# Patient Record
Sex: Male | Born: 1960
Health system: Southern US, Community
[De-identification: ages and names within clinical notes are randomized; demographics above are authoritative.]

## PROBLEM LIST (undated history)

## (undated) DIAGNOSIS — I4891 Unspecified atrial fibrillation: Secondary | ICD-10-CM

## (undated) DIAGNOSIS — Z5189 Encounter for other specified aftercare: Secondary | ICD-10-CM

## (undated) DIAGNOSIS — E785 Hyperlipidemia, unspecified: Secondary | ICD-10-CM

## (undated) DIAGNOSIS — J45909 Unspecified asthma, uncomplicated: Secondary | ICD-10-CM

## (undated) DIAGNOSIS — E119 Type 2 diabetes mellitus without complications: Secondary | ICD-10-CM

## (undated) DIAGNOSIS — K219 Gastro-esophageal reflux disease without esophagitis: Secondary | ICD-10-CM

## (undated) DIAGNOSIS — D649 Anemia, unspecified: Secondary | ICD-10-CM

## (undated) DIAGNOSIS — I10 Essential (primary) hypertension: Secondary | ICD-10-CM

## (undated) HISTORY — DX: Anemia, unspecified: D64.9

## (undated) HISTORY — DX: Type 2 diabetes mellitus without complications: E11.9

## (undated) HISTORY — DX: Encounter for other specified aftercare: Z51.89

## (undated) HISTORY — PX: HERNIA REPAIR: SHX51

## (undated) HISTORY — DX: Hyperlipidemia, unspecified: E78.5

## (undated) HISTORY — DX: Unspecified asthma, uncomplicated: J45.909

---

## 1998-05-03 ENCOUNTER — Ambulatory Visit (HOSPITAL_COMMUNITY): Admission: RE | Admit: 1998-05-03 | Discharge: 1998-05-03 | Payer: Self-pay | Admitting: General Surgery

## 1998-05-05 ENCOUNTER — Emergency Department (HOSPITAL_COMMUNITY): Admission: EM | Admit: 1998-05-05 | Discharge: 1998-05-05 | Payer: Self-pay | Admitting: Internal Medicine

## 1998-06-14 ENCOUNTER — Encounter: Payer: Self-pay | Admitting: General Surgery

## 1998-06-14 ENCOUNTER — Ambulatory Visit (HOSPITAL_COMMUNITY): Admission: RE | Admit: 1998-06-14 | Discharge: 1998-06-14 | Payer: Self-pay | Admitting: General Surgery

## 2001-07-22 ENCOUNTER — Emergency Department (HOSPITAL_COMMUNITY): Admission: EM | Admit: 2001-07-22 | Discharge: 2001-07-22 | Payer: Self-pay | Admitting: Emergency Medicine

## 2001-10-08 ENCOUNTER — Encounter: Payer: Self-pay | Admitting: Emergency Medicine

## 2001-10-08 ENCOUNTER — Emergency Department (HOSPITAL_COMMUNITY): Admission: EM | Admit: 2001-10-08 | Discharge: 2001-10-08 | Payer: Self-pay | Admitting: Emergency Medicine

## 2002-06-03 ENCOUNTER — Emergency Department (HOSPITAL_COMMUNITY): Admission: EM | Admit: 2002-06-03 | Discharge: 2002-06-03 | Payer: Self-pay | Admitting: Emergency Medicine

## 2002-08-14 ENCOUNTER — Emergency Department (HOSPITAL_COMMUNITY): Admission: EM | Admit: 2002-08-14 | Discharge: 2002-08-14 | Payer: Self-pay | Admitting: Emergency Medicine

## 2002-10-19 ENCOUNTER — Emergency Department (HOSPITAL_COMMUNITY): Admission: EM | Admit: 2002-10-19 | Discharge: 2002-10-19 | Payer: Self-pay | Admitting: Emergency Medicine

## 2002-10-19 ENCOUNTER — Encounter: Payer: Self-pay | Admitting: Emergency Medicine

## 2002-11-26 ENCOUNTER — Emergency Department (HOSPITAL_COMMUNITY): Admission: EM | Admit: 2002-11-26 | Discharge: 2002-11-26 | Payer: Self-pay | Admitting: Emergency Medicine

## 2003-01-21 ENCOUNTER — Emergency Department (HOSPITAL_COMMUNITY): Admission: EM | Admit: 2003-01-21 | Discharge: 2003-01-21 | Payer: Self-pay | Admitting: Emergency Medicine

## 2006-03-26 ENCOUNTER — Inpatient Hospital Stay (HOSPITAL_COMMUNITY): Admission: EM | Admit: 2006-03-26 | Discharge: 2006-03-29 | Payer: Self-pay | Admitting: Emergency Medicine

## 2006-03-26 ENCOUNTER — Ambulatory Visit: Payer: Self-pay | Admitting: Cardiology

## 2006-03-27 ENCOUNTER — Encounter: Payer: Self-pay | Admitting: Cardiovascular Disease

## 2006-03-28 ENCOUNTER — Encounter: Payer: Self-pay | Admitting: Cardiology

## 2007-10-16 ENCOUNTER — Emergency Department (HOSPITAL_COMMUNITY): Admission: EM | Admit: 2007-10-16 | Discharge: 2007-10-16 | Payer: Self-pay | Admitting: Emergency Medicine

## 2008-09-05 IMAGING — CR DG CHEST 2V
2 series · 2 of 2 positions shown · non-contrast
Comparison: 03/26/06.

CLINICAL DATA: High blood pressure, short of breath.  
 CHEST - 2 VIEW:

[w chest pa]
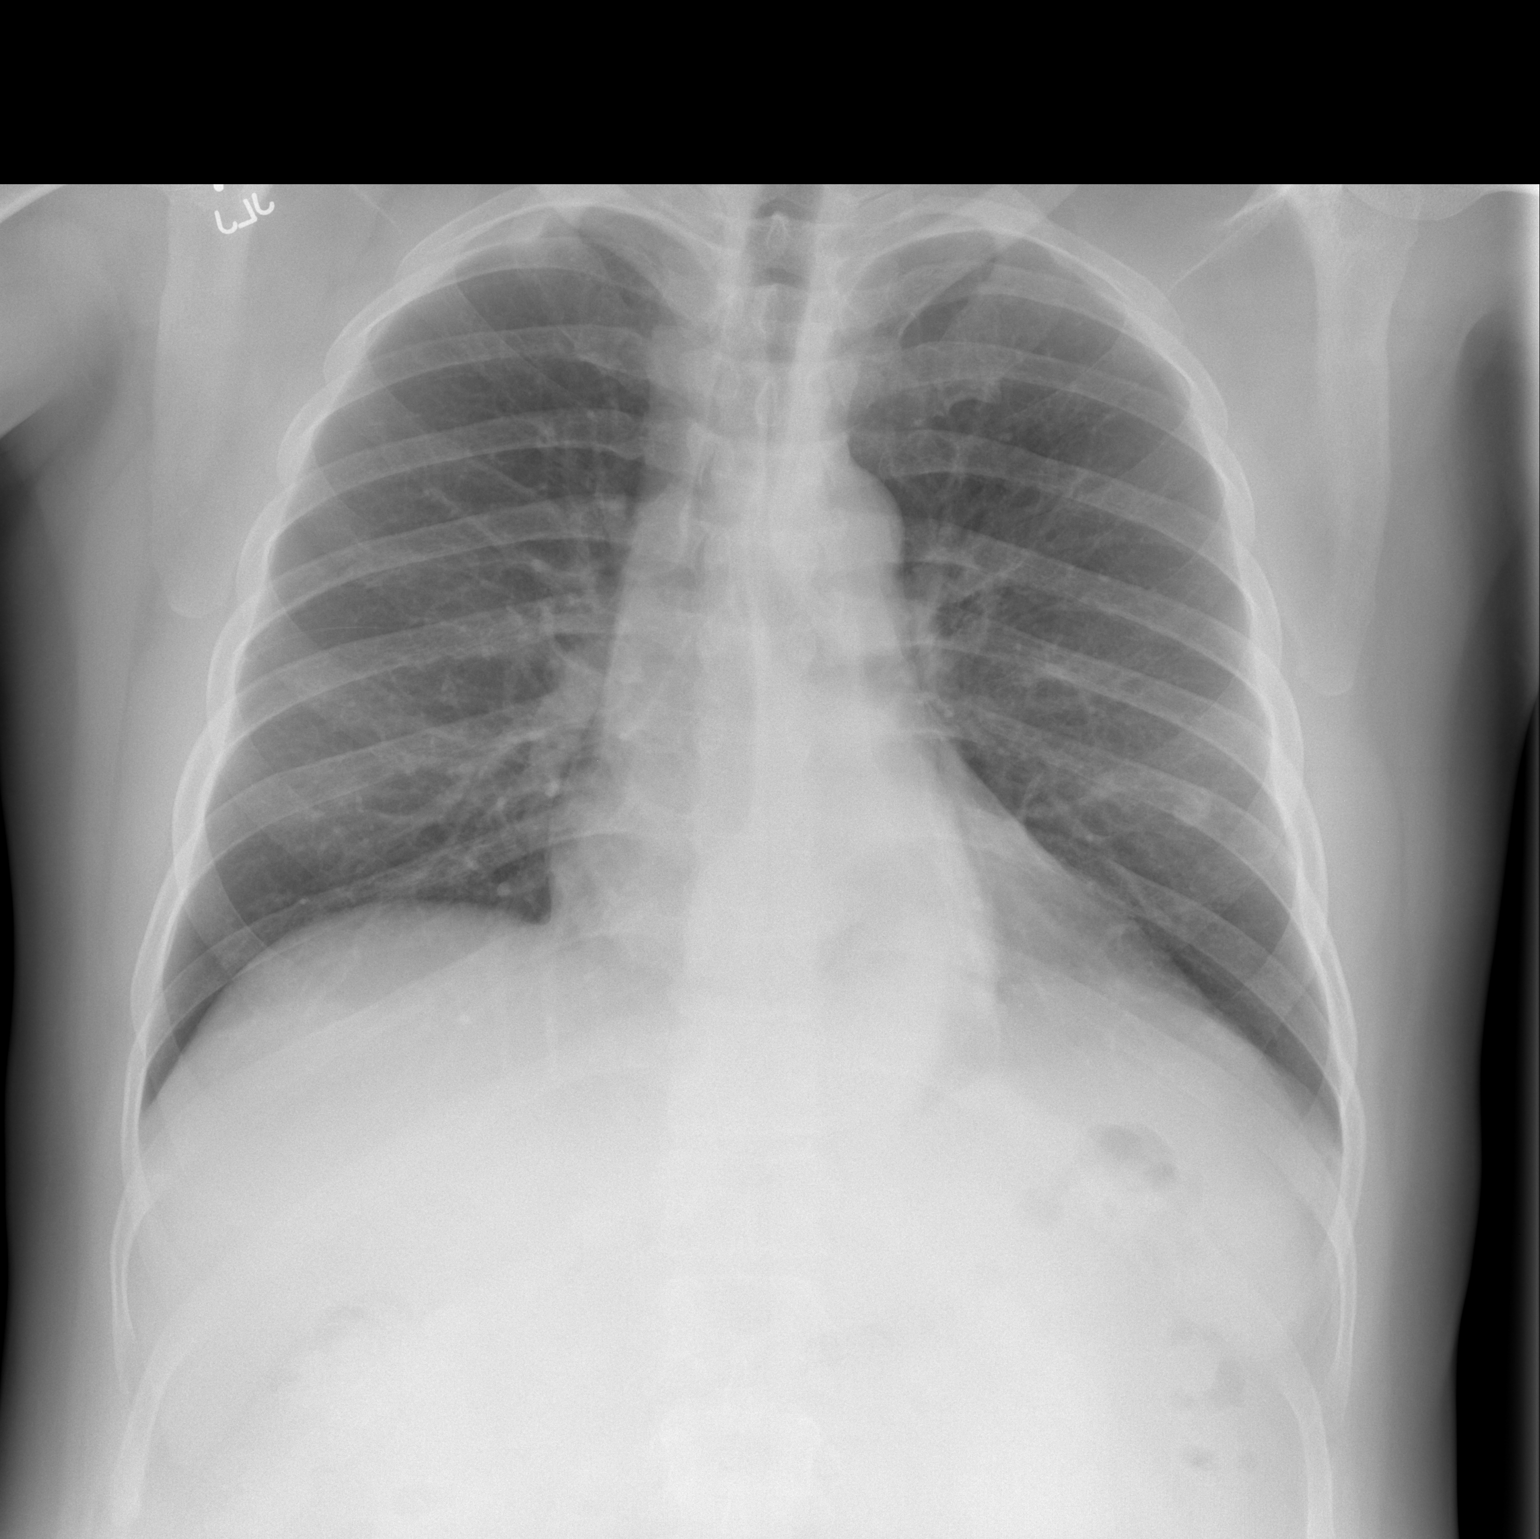

[w chest lat]
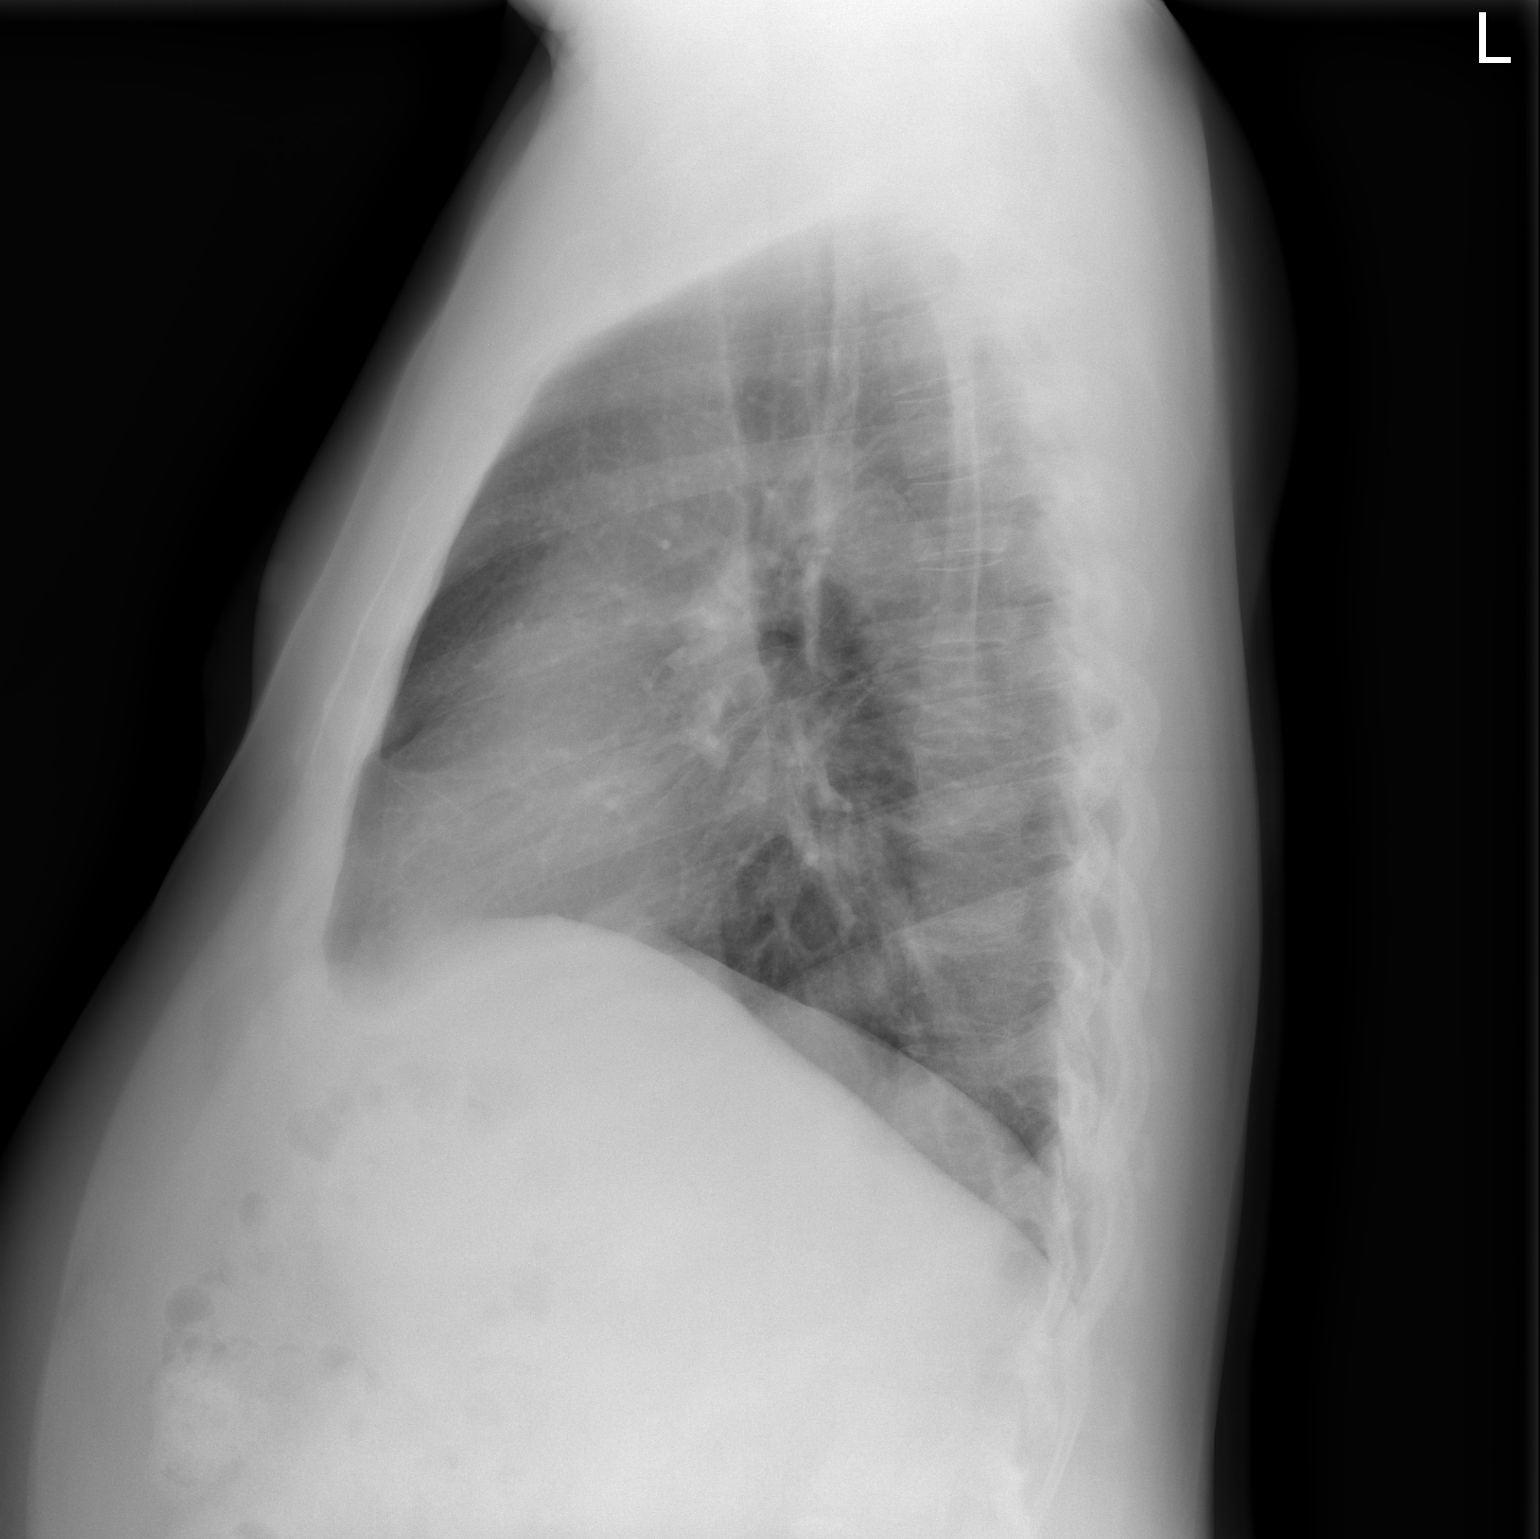

[2 of 2 positions shown; findings below may reference images not displayed]

FINDINGS: Heart and mediastinal contours normal.  Lungs clear except for a rounded nodular shadow projecting over the left lower lobe.  This may be the left nipple shadow, but a repeat PA with nipple markers is needed.
 There is a hiatal hernia.
 No heart failure or pleural fluid.
IMPRESSION: 1.  Probable left nipple shadow simulating lung nodule -- need repeat PA with nipples marked. 
 2.  Hiatal hernia.

## 2011-02-02 NOTE — Consult Note (Signed)
NAME:  Jonathan Gonzalez, Jonathan Gonzalez NO.:  [id number]   MEDICAL RECORD NO.:  [id number]          PATIENT TYPE:  INP   LOCATION:  1417                         FACILITY:  [facility]   PHYSICIAN:  Willa Rough, M.D.     DATE OF BIRTH:  [birth year]   DATE OF CONSULTATION:  03/27/2006  DATE OF DISCHARGE:                                   CONSULTATION   PRIMARY CARE PHYSICIAN:  None.   PRIMARY CARDIOLOGIST:  New, will be Willa Rough, M.D.   CONSULTING PHYSICIAN:  Willa Rough, M.D.   CHIEF COMPLAINT:  Chest pain.   HISTORY OF PRESENT ILLNESS:  Jonathan Gonzalez is a 50 year old male with no  previous history of coronary artery disease.  He was wakened by substernal  chest pain described as a tightness and pressure at 5/10 at 2 a.m. on March 26, 2006.  The pain radiated to his left shoulder and arm and was associated  with shortness of breath and diaphoresis but no nausea or vomiting.  It  lasted approximately 15 minutes and then he slept.  Later that day, he had a  second episode that again last about 15 minutes.  It also started at rest.  He was not under any unusual amount of stress.  He came to the emergency  room.  [facility] team admitted him for chest pain and hypertension and we  were asked to consult.   These are the only 2 episodes of chest pain [name] has ever had.  He  did not try any medications for his pain.  He states that his symptoms were  somewhat different from is regular reflux symptoms which he has frequently  and which do occasionally wake him.  He has been walking regularly for  exercise without any shortness of breath or chest tightness.   PAST MEDICAL HISTORY:  1.  He has a history of hypertension which he states was first noticed at      age 33.  2.  He denies any history of diabetes, hyperlipidemia, family history of      premature coronary artery disease, obesity, tobacco or drug use.  3.  He has a history of gastroesophageal reflux disease and hiatal  hernia.  4.  He has a history of noncompliance with hypertensive medications which he      says is because the last physician he saw did not prescribe any, and he      got the impression that he no longer needed them.   SURGICAL HISTORY:  He is status post hernia repair.   ALLERGIES:  No known drug allergies.   MEDICATIONS PRIOR TO ADMISSION:  Was taking no prescription drugs only over-  the-counter Pepcid.   CURRENT MEDICATIONS:  1.  Aspirin 81 mg a day.  2.  DVT Lovenox.  3.  HCTZ 25 mg a day.  4.  Lopressor 25 mg b.i.d.  5.  Protonix 40 mg a day.   SOCIAL HISTORY:  He lives in [place] with his wife and works for the  [employer].  He walks daily for exercise.  He does not and  has never used alcohol, tobacco, or drugs.   FAMILY HISTORY:  His mother died in her 60s of an accidental overdose.  His  father died at age 32, but he does not know the cause.  He has two brothers,  one of whom died of Crohn disease, neither of whom has heart disease.   REVIEW OF SYSTEMS:  He denies any recent fevers, chills, or sweats.  The  chest pain is described above.  He only gets short of breath with the chest  pain.  He denies dyspnea on exertion, orthopnea, PND, edema, or  palpitations.  Occasionally, he coughs and has a slight wheeze when his  reflux symptoms are significant.  He denies depression, anxiety,  arthralgias, hematemesis, hemoptysis, or melena.  He has regular reflux  symptoms which occasionally wake him.  Review of systems is otherwise  negative.   PHYSICAL EXAMINATION:  VITAL SIGNS:  His temperature is 98.5, blood pressure  on admission was 194/125 and after receiving morning medications an hour and  half ago is 150/110.  Pulse is 67 and regular, respiratory rate 20, O2  saturation 99% on 2 liters.  GENERAL:  He is a well-developed, well-nourished, African-American male in  no acute distress.  HEENT:  His head is normocephalic and atraumatic with pupils  equal, round,  and reactive to light and accommodation.  Extraocular movements intact.  Sclera clear.  Nares without discharge.  NECK:  There is no lymphadenopathy, thyromegaly, bruit, or JVD noted.  CV:  His heart is regular in rate rhythm with an S1-S2 but no significant  murmur, rub, or gallop is noted.  His PMI is not displaced.  His distal  pulses are 2+ in all four extremities.  LUNGS:  Clear to auscultation bilaterally.  SKIN:  No rashes or lesions are noted.  ABDOMEN;  Soft and nontender with active bowel sounds.  No  hepatosplenomegaly on palpation.  EXTREMITIES:  There is no cyanosis, clubbing or edema noted.  MUSCULOSKELETAL:  He has got no joint deformity, effusions, and no spine or  CVA tenderness.  NEUROLOGIC:  He is alert and oriented.  His cranial nerves II-XII grossly  intact.   CHEST X-RAY:  No acute disease.   EKG:  Rate is 100 and is sinus rhythm/sinus tachycardia with no acute  ischemic changes, borderline Q-waves are seen in leads I and aVL only, and  borderline voltage criteria for LVH.   LABORATORY VALUES:  Hemoglobin 16.1, hematocrit 49.6, WBC 9.5, platelets  281.  Sodium 140, potassium 3.4, chloride 102, CO2 29, BUN 13, creatinine  1.2, glucose 100.  INR 1.0, PTT 25.  CK-MB and troponin I negative x3.  Total cholesterol 153, triglycerides 76, HDL 54, LDL 84.   IMPRESSION:  1.  Chest pain.  His cardiac enzymes are negative for an myocardial      infarction and his EKG is not acute.  An echocardiogram is pending.  If      his ejection fraction is decreased or he has wall motion abnormalities,      a cardiac catheterization is indicated.  As he had 2 episodes of resting      pain and his hypertension has been poorly controlled, his cardiac risk      factor profile is elevated.  A tentative plan is for cardiac      catheterization on March 28, 2006.  If the cardiac catheterization does     not demonstrate significant coronary artery disease, consider  reflux  as      a source for his symptoms.  2.  Hypertension.  Agree with medications prescribed by primary care.  I      emphasized to the patient the need to be compliant with his medications      and followup as an outpatient.  Per Dr. Henrietta Hoover recommendations and      because his diastolic is greater than 110, we will add enalapril 5 mg to      his medication regimen.  (It is important for compliance that his      medications be available at [facility] for $4).  3.  Mr. Wenrich is otherwise under the excellent care of the [facility]      physicians.  We will supplement his potassium and recheck in a.m..  The      case manager is aware of Mr. Ponzo' need for an outpatient primary      care and they will discuss options with him.   Again, this is Theodore Demark [facility] dictating for Dr. Willa Rough who  determined the plan of care,      Theodore Demark, P.A. [facility]    ______________________________  Willa Rough, M.D.    RB/MEDQ  D:  03/27/2006  T:  03/27/2006  Job:  [id number]

## 2011-02-02 NOTE — Discharge Summary (Signed)
NAME:  Jonathan Gonzalez, Jonathan Gonzalez NO.:  [id number]   MEDICAL RECORD NO.:  [id number]          PATIENT TYPE:  OUT   LOCATION:  MRI                          FACILITY:  [facility]   PHYSICIAN:  Isidor Holts, M.D.  DATE OF BIRTH:  1961-01-06   DATE OF ADMISSION:  03/28/2006  DATE OF DISCHARGE:  03/29/2006                                 DISCHARGE SUMMARY   PMD:  Unassigned.   DISCHARGE DIAGNOSES:  1.  Atypical chest pain, noncardiac.  2.  Gastroesophageal reflux disease (GERD).  3.  Hypertension.  4.  Transient renal insufficiency secondary to HCTZ.   DISCHARGE MEDICATIONS:  1.  Prilosec OTC 20 mg p.o. b.i.d. times one week, then 20 mg p.o. daily.  2.  Lopressor 25 mg p.o. b.i.d.  3.  Vasotec 5 mg p.o. daily.  4.  Aspirin 81 mg p.o. daily.   PROCEDURES:  1.  Previous chest x-ray dated 03/26/2006 showed no active cardiopulmonary      disease.  2.  2-D echocardiogram dated 03/27/2006 showed overall normal left      ventricular systolic function, left ventricle mildly increased, aortic      valve was mildly calcified.  There was mild aortic root dilatation.      There was mild mitral valvular regurgitation.  Left atrium was mildly      dilated.  3.  Adenosine stress Myoview dated 03/28/2006 showed a negative test with no      evidence of reversible defects.  Ejection fraction was 68%.   CONSULTATIONS:  Willa Rough, M.D., cardiologist.   ADMISSION HISTORY:  As per H&P note of 03/26/2006. However, in brief, this  is a 50 year old male, with known history of hypertension, albeit off  antihypertensive medications for approximately three years, who presents  with recurrent central chest pain, radiating to the left side of the neck  and left shoulder, associated with shortness of breath and diaphoresis.  On  initial evaluation in the emergency department, his blood pressure was found  to be markedly elevated at 194/125 mmHg.  The patient was therefore admitted  for evaluation,  investigation and management.   CLINICAL COURSE:  1.  Chest pain.  The patient had recurrent episodes of central chest pain,      against a background of known hypertension, which was now uncontrolled.      This raised the possibility of myocardial ischemia.  A 12-lead EKG      showed no acute ischemic changes.  Chest x-ray was unremarkable.  The      patient was placed on Aspirin, p.r.n. sublingual NTG and beta blocker.      Cardiology consultation was called, and was kindly provided by Dr.      Willa Rough.  The patient underwent 2-D echocardiogram, which showed      overall normal left ventricular function, and mild left ventricular      hypertrophy.  The patient underwent stress Persantine Myoview on      03/28/2006, which showed no reversible defect.  Ejection fraction was      noted to be 58%.  Chest pain is therefore  deemed atypical and not      secondary to ischemic heart disease.   1.  Severe uncontrolled hypertension.  The patient is a known hypertensive,      who has however, not been on anti-hypertensive medications.  As a matter      of fact, he was last on HCTZ approximately three years ago, and has no      regular follow up with any MD.  Blood pressure was markedly elevated at      the time of presentation, i.e. 194/125 mmHg.  The patient was initially      commenced on a combination of beta blocker and HCTZ with satisfactory      blood pressure control.  However, on 03/28/2006, his BUN and creatinine      were noted to be 25/1.6 respectively, against a BUN of 8 and a      creatinine of 1.2 at the time of admission.  This was deemed secondary      to HCTZ, which was then discontinued.  The patient has been placed on      enalapril 5 mg p.o. daily instead and we are happy to note that on      03/29/2005, BUN was 24 with a creatinine of 1.3.  Blood pressure was      also well controlled at 109/75 mmHg on 03/29/2006.   1.  GERD.  This is likely contributory to #1 above.   The patient has been      placed on proton pump inhibitor treatment for this.   1.  Renal insufficiency.  Refer to #2 above.  The patient will need periodic      check of BUN and creatinine, which, we will defer to patient's PMD.   DISPOSITION:  The patient was considered sufficiently recovered and  clinically stable to be discharged on 03/29/2006.  He is expected to return  to his regular duties on 04/01/2006.   DIET:  Healthy heart/2 gram sodium diet.   ACTIVITY:  No restrictions.   WOUND CARE:  Not applicable.   PAIN MANAGEMENT:  Not applicable.   FOLLOW UP INSTRUCTIONS:  The patient has been strongly recommended to  establish care at [facility], within one week of discharge.  He has been  handed the appropriate information and he is to call for an appointment.  All of this has been communicated to him.  He verbalized understanding.      Isidor Holts, M.D.  Electronically Signed     CO/MEDQ  D:  03/29/2006  T:  03/29/2006  Job:  [id number]

## 2011-02-02 NOTE — H&P (Signed)
NAME:  Jonathan Gonzalez, Jonathan Gonzalez NO.:  [id number]   MEDICAL RECORD NO.:  [id number]          PATIENT TYPE:  EMS   LOCATION:  ED                           [facility]ACILITY[facility]   PHYSICIAN:  Isidor Holts, M.D.  DATE OF BIRTH:  26-Aug-1961   DATE OF ADMISSION:  03/26/2006  DATE OF DISCHARGE:                                HISTORY & PHYSICAL   PRIMARY MEDICAL DOCTOR:  Unassigned.   CHIEF COMPLAINT:  Chest pain.   HISTORY OF PRESENT ILLNESS:  This is a 50 year old male.  For past medical  history, see below.  The patient is, however, a known hypertensive who has  been noncompliant with his medications for approximately 3 years now.  According to the patient, he was quite well until he went to bed on March 25, 2006.  He subsequently was awoken at 2 a.m. on March 26, 2006, by chest pain  which he described as tightness associated with shortness of breath and  diaphoresis and appeared to radiate to the left side of his neck and his  left shoulder.  This lasted approximately 2 minutes and then subsided and  the patient went back to sleep.  At about 12 noon on March 26, 2006, he had  the recurrence of similar chest pain, while sitting and watching TV with his  spouse.  He took 2 tablets of low dose Aspirin and went off to the emergency  department.  The pain lasted approximately 15 minutes and subsided just  after he got into the emergency department.  He has had no recurrences  since.   PAST MEDICAL HISTORY:  Hypertension.   No significant surgical history.   MEDICATION HISTORY:  He was on hydrochlorothiazide, but stopped taking this  about 3 years ago.   ALLERGIES:  No known drug allergies.   SYSTEMS REVIEW:  As per HPI and chief complaint, otherwise negative.   SOCIAL HISTORY:  The patient is married, has 1 son, is a nonsmoker,  nondrinker, has no history of drug abuse, works at [employer].   FAMILY HISTORY:  Had 2 brothers, one died from severe Crohn  disease.  The  other one is alive and well.  The patient's mother died at age 75 years from  diabetes mellitus and end-stage renal disease.  His father died at age 80  years, cause unknown.  Family history is otherwise noncontributory.   PHYSICAL EXAMINATION:  VITAL SIGNS:  Temperature 98.9, pulse 87 per minute  regular, respiratory rate 18, BP 194/125-mmHg - rechecked 164/127-mmHg.  Pulse oximeter 98% on room air.  GENERAL:  The patient does not appear to be in obvious acute distress.  Alert, communicative, not short of breath at rest.  HEENT:  No clinical pallor.  No jaundice.  No conjunctival injection.  NECK:  Supple.  JVP not seen.  No palpable lymphadenopathy.  No palpable  goiter.  CHEST:  Clinically clear to auscultation.  No wheezes.  No crackles.  HEART:  Sounds 1 and 2 heard.  Normal regular.  No murmurs.  ABDOMEN:  Full, soft, and nontender.  There is  no palpable organomegaly.  No  palpable masses.  Normal bowel sounds.  EXTREMITIES:  No pitting edema.  Palpable peripheral pulses.  MUSCULOSKELETAL:  Is quite unremarkable.  CENTRAL NERVOUS SYSTEM:  No focal neurologic deficits on gross examination.   INVESTIGATIONS:  CBC:  WBC 9.5, hemoglobin 16.1, hematocrit 49.6, platelets  281.  Electrolytes:  Sodium 140, potassium 3.5, chloride 100, CO2 30, BUN 8,  creatinine 1.2, glucose 116.  Troponin I at point-of-care less than 0.05.  INR 1.  Chest x-ray, dated March 26, 2006, shows no active disease.  EKG,  dated March 26, 2006, showed sinus rhythm regular, normal axis, 100 per  minute, no acute ischemic changes.   ASSESSMENT/PLAN:  1.  Chest pain.  This is likely angina, secondary to severe uncontrolled      hypertension.  There are no acute EKG changes.  We shall admit for      telemetric monitoring, cycle cardiac enzymes, and do TSH and lipids.      Meanwhile, we will utilize sublingual nitroglycerin as needed and      commence the patient on Aspirin and beta-blocker.   1.   Hypertension.  This is uncontrolled, and likely contributory to #1      above, secondary to left ventricular strain, however, the patient has no      clinical congestive heart failure.  The patient has had long term      noncompliance with his antihypertensive medications.  We shall commence      beta-blocker and hydrochlorothiazide and arrange a 2D echocardiogram.   Note, the patient will need cardiology consultation in the a.m. of March 27, 2006, for possible risk stratification.   Further management will depend on clinical course.      Isidor Holts, M.D.  Electronically Signed     CO/MEDQ  D:  03/26/2006  T:  03/26/2006  Job:  [id number]

## 2020-11-04 ENCOUNTER — Emergency Department (HOSPITAL_COMMUNITY)
Admission: EM | Admit: 2020-11-04 | Discharge: 2020-11-04 | Disposition: A | Discharge status: Home / Self Care | Payer: Self-pay | Attending: Emergency Medicine | Admitting: Emergency Medicine

## 2020-11-04 ENCOUNTER — Encounter (HOSPITAL_COMMUNITY): Payer: Self-pay | Admitting: *Deleted

## 2020-11-04 ENCOUNTER — Other Ambulatory Visit: Payer: Self-pay

## 2020-11-04 DIAGNOSIS — R739 Hyperglycemia, unspecified: Secondary | ICD-10-CM | POA: Insufficient documentation

## 2020-11-04 DIAGNOSIS — I10 Essential (primary) hypertension: Secondary | ICD-10-CM | POA: Insufficient documentation

## 2020-11-04 HISTORY — DX: Essential (primary) hypertension: I10

## 2020-11-04 LAB — CBC WITH DIFFERENTIAL/PLATELET
Abs Immature Granulocytes: 0.05 10*3/uL (ref 0.00–0.07)
Basophils Absolute: 0.1 10*3/uL (ref 0.0–0.1)
Basophils Relative: 1 %
Eosinophils Absolute: 0.1 10*3/uL (ref 0.0–0.5)
Eosinophils Relative: 1 %
HCT: 44 % (ref 39.0–52.0)
Hemoglobin: 14.2 g/dL (ref 13.0–17.0)
Immature Granulocytes: 1 %
Lymphocytes Relative: 19 %
Lymphs Abs: 1.8 10*3/uL (ref 0.7–4.0)
MCH: 24.5 pg — ABNORMAL LOW (ref 26.0–34.0)
MCHC: 32.3 g/dL (ref 30.0–36.0)
MCV: 75.9 fL — ABNORMAL LOW (ref 80.0–100.0)
Monocytes Absolute: 0.7 10*3/uL (ref 0.1–1.0)
Monocytes Relative: 8 %
Neutro Abs: 6.8 10*3/uL (ref 1.7–7.7)
Neutrophils Relative %: 70 %
Platelets: 271 10*3/uL (ref 150–400)
RBC: 5.8 MIL/uL (ref 4.22–5.81)
RDW: 19.5 % — ABNORMAL HIGH (ref 11.5–15.5)
WBC: 9.5 10*3/uL (ref 4.0–10.5)
nRBC: 0 % (ref 0.0–0.2)

## 2020-11-04 LAB — BASIC METABOLIC PANEL
Anion gap: 19 — ABNORMAL HIGH (ref 5–15)
Anion gap: 14 (ref 5–15)
BUN: 13 mg/dL (ref 6–20)
BUN: 12 mg/dL (ref 6–20)
CO2: 14 mmol/L — ABNORMAL LOW (ref 22–32)
CO2: 17 mmol/L — ABNORMAL LOW (ref 22–32)
Calcium: 9.1 mg/dL (ref 8.9–10.3)
Calcium: 8.1 mg/dL — ABNORMAL LOW (ref 8.9–10.3)
Chloride: 100 mmol/L (ref 98–111)
Chloride: 105 mmol/L (ref 98–111)
Creatinine, Ser: 1.24 mg/dL (ref 0.61–1.24)
Creatinine, Ser: 1.09 mg/dL (ref 0.61–1.24)
GFR, Estimated: >60 mL/min (ref >60)
GFR, Estimated: >60 mL/min (ref >60)
Glucose, Bld: 377 mg/dL — ABNORMAL HIGH (ref 70–99)
Glucose, Bld: 295 mg/dL — ABNORMAL HIGH (ref 70–99)
Potassium: 3.6 mmol/L (ref 3.5–5.1)
Potassium: 4 mmol/L (ref 3.5–5.1)
Sodium: 133 mmol/L — ABNORMAL LOW (ref 135–145)
Sodium: 136 mmol/L (ref 135–145)

## 2020-11-04 LAB — CBG MONITORING, ED: Glucose-Capillary: 351 mg/dL — ABNORMAL HIGH (ref 70–99)

## 2020-11-04 MED ORDER — INSULIN ASPART 100 UNIT/ML ~~LOC~~ SOLN
6.0000 [IU] | Freq: Once | SUBCUTANEOUS | Status: AC
  Administered 2020-11-04: 6 [IU] via SUBCUTANEOUS
  Filled 2020-11-04: qty 0.06

## 2020-11-04 MED ORDER — SODIUM CHLORIDE 0.9 % IV BOLUS
2000.0000 mL | Freq: Once | INTRAVENOUS | Status: AC
  Administered 2020-11-04: 2000 mL via INTRAVENOUS

## 2020-11-04 MED ORDER — SODIUM CHLORIDE 0.9 % IV SOLN: INTRAVENOUS | Status: DC

## 2020-11-04 MED ORDER — METFORMIN HCL 500 MG PO TABS: 500.0000 mg | ORAL_TABLET | Freq: Two times a day (BID) | ORAL | 1 refills | Status: DC

## 2020-11-04 NOTE — ED Triage Notes (Signed)
Pt states since Jan 29th he has been feeling tired, increased urination and some weak feelings.HTN noted in triage 181/108. CBG in Triage 351

## 2020-11-04 NOTE — ED Provider Notes (Signed)
[facility] DEPT Provider Note   CSN: [id number] Arrival date & time: 11/04/20  0746     History Chief Complaint  Patient presents with  . Fatigue  . Urinary Frequency    Jonathan Gonzalez is a 60 y.o. male.  60 year old male presents with several weeks of polyuria as well as polydipsia.  Has noticed increased fatigue.  Denies any fever or chills.  No prior history of diabetes.  No abdominal or chest discomfort.  No treatment use prior to arrival.  Nothing makes her symptoms better or worse.        Past Medical History:  Diagnosis Date  . Hypertension     There are no problems to display for this patient.   Past Surgical History:  Procedure Laterality Date  . HERNIA REPAIR         No family history on file.  Social History   Tobacco Use  . Smoking status: Never Smoker  . Smokeless tobacco: Never Used  Substance Use Topics  . Alcohol use: Never  . Drug use: Never    Home Medications Prior to Admission medications   Not on File    Allergies    Patient has no known allergies.  Review of Systems   Review of Systems  All other systems reviewed and are negative.   Physical Exam Updated Vital Signs BP (!) 181/108 (BP Location: Right Arm)   Pulse (!) 109   Temp 97.8 F (36.6 C) (Oral)   Resp 19   Ht 1.727 m (5' 8" )   Wt 104.3 kg   SpO2 98%   BMI 34.97 kg/m   Physical Exam Vitals and nursing note reviewed.  Constitutional:      General: He is not in acute distress.    Appearance: Normal appearance. He is well-developed and well-nourished. He is not toxic-appearing.  HENT:     Head: Normocephalic and atraumatic.  Eyes:     General: Lids are normal.     Extraocular Movements: EOM normal.     Conjunctiva/sclera: Conjunctivae normal.     Pupils: Pupils are equal, round, and reactive to light.  Neck:     Thyroid: No thyroid mass.     Trachea: No tracheal deviation.  Cardiovascular:     Rate and Rhythm: Normal  rate and regular rhythm.     Heart sounds: Normal heart sounds. No murmur heard. No gallop.   Pulmonary:     Effort: Pulmonary effort is normal. No respiratory distress.     Breath sounds: Normal breath sounds. No stridor. No decreased breath sounds, wheezing, rhonchi or rales.  Abdominal:     General: Bowel sounds are normal. There is no distension.     Palpations: Abdomen is soft.     Tenderness: There is no abdominal tenderness. There is no CVA tenderness or rebound.  Musculoskeletal:        General: No tenderness or edema. Normal range of motion.     Cervical back: Normal range of motion and neck supple.  Skin:    General: Skin is warm and dry.     Findings: No abrasion or rash.  Neurological:     Mental Status: He is alert and oriented to person, place, and time.     GCS: GCS eye subscore is 4. GCS verbal subscore is 5. GCS motor subscore is 6.     Cranial Nerves: No cranial nerve deficit.     Sensory: No sensory deficit.     Deep  Tendon Reflexes: Strength normal.  Psychiatric:        Mood and Affect: Mood and affect normal.        Speech: Speech normal.        Behavior: Behavior normal.     ED Results / Procedures / Treatments   Labs (all labs ordered are listed, but only abnormal results are displayed) Labs Reviewed  CBC WITH DIFFERENTIAL/PLATELET - Abnormal; Notable for the following components:      Result Value   MCV 75.9 (*)    MCH 24.5 (*)    RDW 19.5 (*)    All other components within normal limits  CBG MONITORING, ED - Abnormal; Notable for the following components:   Glucose-Capillary 351 (*)    All other components within normal limits  URINALYSIS, ROUTINE W REFLEX MICROSCOPIC  BASIC METABOLIC PANEL    EKG None  Radiology No results found.  Procedures Procedures   Medications Ordered in ED Medications  0.9 %  sodium chloride infusion (has no administration in time range)  sodium chloride 0.9 % bolus 2,000 mL (2,000 mLs Intravenous New  Bag/Given 11/04/20 0825)    ED Course  I have reviewed the triage vital signs and the nursing notes.  Pertinent labs & imaging results that were available during my care of the patient were reviewed by me and considered in my medical decision making (see chart for details).    MDM Rules/Calculators/A&P                          Patient with evidence of hyperglycemia with increased anion gap. Patient given IV hydration along with insulin and repeat be met shows that his anion gap is narrowed CO2 has improved. His blood sugar is improved as well 2. Will place patient on Metformin and discharge. Final Clinical Impression(s) / ED Diagnoses Final diagnoses:  None    Rx / DC Orders ED Discharge Orders    None       Lacretia Leigh, MD 11/04/20 1201

## 2020-11-25 ENCOUNTER — Encounter: Payer: Self-pay | Admitting: Nurse Practitioner

## 2020-11-25 ENCOUNTER — Other Ambulatory Visit: Payer: Self-pay

## 2020-11-25 ENCOUNTER — Ambulatory Visit: Payer: Self-pay | Attending: Nurse Practitioner | Admitting: Nurse Practitioner

## 2020-11-25 ENCOUNTER — Other Ambulatory Visit: Payer: Self-pay | Admitting: Nurse Practitioner

## 2020-11-25 VITALS — BP 168/108 | HR 99 | Resp 16 | Ht 68.0 in | Wt 230.8 lb

## 2020-11-25 DIAGNOSIS — E785 Hyperlipidemia, unspecified: Secondary | ICD-10-CM

## 2020-11-25 DIAGNOSIS — Z1211 Encounter for screening for malignant neoplasm of colon: Secondary | ICD-10-CM

## 2020-11-25 DIAGNOSIS — I10 Essential (primary) hypertension: Secondary | ICD-10-CM

## 2020-11-25 DIAGNOSIS — Z1159 Encounter for screening for other viral diseases: Secondary | ICD-10-CM

## 2020-11-25 DIAGNOSIS — Z114 Encounter for screening for human immunodeficiency virus [HIV]: Secondary | ICD-10-CM

## 2020-11-25 DIAGNOSIS — E1165 Type 2 diabetes mellitus with hyperglycemia: Secondary | ICD-10-CM

## 2020-11-25 LAB — GLUCOSE, POCT (MANUAL RESULT ENTRY): POC Glucose: 229 mg/dl — AB (ref 70–99)

## 2020-11-25 LAB — POCT GLYCOSYLATED HEMOGLOBIN (HGB A1C): HbA1c, POC (controlled diabetic range): 12.2 % — AB (ref 0.0–7.0)

## 2020-11-25 MED ORDER — TRUE METRIX BLOOD GLUCOSE TEST VI STRP: ORAL_STRIP | 12 refills | Status: DC

## 2020-11-25 MED ORDER — LISINOPRIL 20 MG PO TABS: 20.0000 mg | ORAL_TABLET | Freq: Every day | ORAL | 3 refills | Status: DC

## 2020-11-25 MED ORDER — TRUEPLUS LANCETS 28G MISC: 3 refills | Status: DC

## 2020-11-25 MED ORDER — METFORMIN HCL 500 MG PO TABS
1000.0000 mg | ORAL_TABLET | Freq: Two times a day (BID) | ORAL | 1 refills | Status: DC
Start: 2020-11-25 — End: 2021-02-02

## 2020-11-25 MED ORDER — TRUE METRIX METER W/DEVICE KIT: PACK | 0 refills | Status: DC

## 2020-11-25 MED ORDER — EMPAGLIFLOZIN 10 MG PO TABS: 10.0000 mg | ORAL_TABLET | Freq: Every day | ORAL | 2 refills | Status: DC

## 2020-11-25 MED FILL — TRUEplus LANCETS 28G MISC: 50 days supply | Qty: 100 | Fill #0

## 2020-11-25 MED FILL — TRUE METRIX GLUCOSE TEST ST: 50 days supply | Qty: 100 | Fill #0

## 2020-11-25 MED FILL — METFORMIN HCL 500 MG TABS: 500 | 30 days supply | Qty: 120 | Fill #0

## 2020-11-25 MED FILL — JARDIANCE 10 MG TABLET: 10 | 14 days supply | Qty: 14 | Fill #0

## 2020-11-25 MED FILL — LISINOPRIL 20 MG TABLET: 20 | 30 days supply | Qty: 30 | Fill #0

## 2020-11-25 MED FILL — !TRUE METRIX BLOOD GLUCOSE: 1 days supply | Qty: 1 | Fill #0

## 2020-11-25 NOTE — Progress Notes (Signed)
Assessment & Plan:  Jonathan Gonzalez was seen today for hospitalization follow-up.  Diagnoses and all orders for this visit:  Type 2 diabetes mellitus with hyperglycemia, without long-term current use of insulin (HCC) -     POCT glucose (manual entry) -     POCT glycosylated hemoglobin (Hb A1C) -     Microalbumin / creatinine urine ratio -     metFORMIN (GLUCOPHAGE) 500 MG tablet; Take 2 tablets (1,000 mg total) by mouth 2 (two) times daily with a meal. NEEDS PASS -     Blood Glucose Monitoring Suppl (TRUE METRIX METER) w/Device KIT; Use as instructed. Check blood glucose level by fingerstick twice per day. E11.65 -     glucose blood (TRUE METRIX BLOOD GLUCOSE TEST) test strip; Use as instructed. Check blood glucose level by fingerstick twice per day. NEEDS PASS -     TRUEplus Lancets 28G MISC; Use as instructed. Check blood glucose level by fingerstick twice per day. NEEDS PASS -     empagliflozin (JARDIANCE) 10 MG TABS tablet; Take 1 tablet (10 mg total) by mouth daily before breakfast. NEEDS PASS Continue blood sugar control as discussed in office today, low carbohydrate diet, and regular physical exercise as tolerated, 150 minutes per week (30 min each day, 5 days per week, or 50 min 3 days per week). Keep blood sugar logs with fasting goal of 90-130 mg/dl, post prandial (after you eat) less than 180.  For Hypoglycemia: BS <60 and Hyperglycemia BS >400; contact the clinic ASAP. Annual eye exams and foot exams are recommended.   Essential hypertension -     lisinopril (ZESTRIL) 20 MG tablet; Take 1 tablet (20 mg total) by mouth daily. NEEDS PASS Continue all antihypertensives as prescribed.  Remember to bring in your blood pressure log with you for your follow up appointment.  DASH/Mediterranean Diets are healthier choices for HTN.    Dyslipidemia, goal LDL below 70 -     Lipid panel INSTRUCTIONS: Work on a low fat, heart healthy diet and participate in regular aerobic exercise program by  working out at least 150 minutes per week; 5 days a week-30 minutes per day. Avoid red meat/beef/steak,  fried foods. junk foods, sodas, sugary drinks, unhealthy snacking, alcohol and smoking.  Drink at least 80 oz of water per day and monitor your carbohydrate intake daily.    Encounter for screening for HIV -     HIV antibody (with reflex)  Colon cancer screening -     Fecal occult blood, imunochemical([facility])  Need for hepatitis C screening test -     HCV Ab w Reflex to Quant PCR    Patient has been counseled on age-appropriate routine health concerns for screening and prevention. These are reviewed and up-to-date. Referrals have been placed accordingly. Immunizations are up-to-date or declined.    Subjective:   Chief Complaint  Patient presents with  . Hospitalization Follow-up   [name] 60 y.o. male presents to office today to establish care He has a past medical history of Diabetes mellitus without complication ([place]), Hyperlipidemia, and Hypertension.  DM2 Evaluated in the ED on 2-18 and diagnosed with new onset DM 2. He was started on metformin 500 mg BID. A1c was not performed at that time. He also does not have a meter so he is unaware of his blood glucose readings. He is very hesitant to start any injectables or insulin. States he has already started to make dietary modifications. Will increase his metformin to 1000  m BID ad jardiance 10 mg daily. He will be sent home with a meter today and return within a few weeks for meter check. He is aware that he may need to start insulin if his blood glucose levels have not improved at that time. He denies any current symptoms of hypo or hyperglycemia Lab Results  Component Value Date   HGBA1C 12.2 (A) 11/25/2020    Essential Hypertension Poorly controlled. Will start him eon lisinopril 20 mg today. Denies chest pain, shortness of breath, palpitations, lightheadedness, dizziness, headaches or BLE edema.  BP  Readings from Last 3 Encounters:  11/25/20 (!) 168/108  11/04/20 (!) 156/95     Review of Systems  Constitutional: Negative for fever, malaise/fatigue and weight loss.  HENT: Negative.  Negative for nosebleeds.   Eyes: Negative.  Negative for blurred vision, double vision and photophobia.  Respiratory: Negative.  Negative for cough and shortness of breath.   Cardiovascular: Negative.  Negative for chest pain, palpitations and leg swelling.  Gastrointestinal: Negative.  Negative for heartburn, nausea and vomiting.  Musculoskeletal: Negative.  Negative for myalgias.  Neurological: Negative.  Negative for dizziness, focal weakness, seizures and headaches.  Psychiatric/Behavioral: Negative.  Negative for suicidal ideas.    Past Medical History:  Diagnosis Date  . Diabetes mellitus without complication ([place])   . Hyperlipidemia   . Hypertension     Past Surgical History:  Procedure Laterality Date  . HERNIA REPAIR      No family history on file.  Social History Reviewed with no changes to be made today.   Outpatient Medications Prior to Visit  Medication Sig Dispense Refill  . Aspirin-Salicylamide-Caffeine (BC HEADACHE POWDER PO) Take 1 Package by mouth as needed (headache/pain).    . metFORMIN (GLUCOPHAGE) 500 MG tablet Take 1 tablet (500 mg total) by mouth 2 (two) times daily with a meal. 60 tablet 1   No facility-administered medications prior to visit.    No Known Allergies     Objective:    BP (!) 168/108   Pulse 99   Resp 16   Ht 5' 8"  (1.727 m)   Wt 230 lb 12.8 oz (104.7 kg)   SpO2 98%   BMI 35.09 kg/m  Wt Readings from Last 3 Encounters:  11/25/20 230 lb 12.8 oz (104.7 kg)  11/04/20 230 lb (104.3 kg)    Physical Exam Vitals and nursing note reviewed.  Constitutional:      Appearance: He is well-developed.  HENT:     Head: Normocephalic and atraumatic.  Cardiovascular:     Rate and Rhythm: Normal rate and regular rhythm.     Heart sounds: Normal  heart sounds. No murmur heard. No friction rub. No gallop.   Pulmonary:     Effort: Pulmonary effort is normal. No tachypnea or respiratory distress.     Breath sounds: Normal breath sounds. No decreased breath sounds, wheezing, rhonchi or rales.  Chest:     Chest wall: No tenderness.  Abdominal:     General: Bowel sounds are normal.     Palpations: Abdomen is soft.  Musculoskeletal:        General: Normal range of motion.     Cervical back: Normal range of motion.  Skin:    General: Skin is warm and dry.  Neurological:     Mental Status: He is alert and oriented to person, place, and time.     Coordination: Coordination normal.  Psychiatric:        Behavior: Behavior normal. Behavior  is cooperative.        Thought Content: Thought content normal.        Judgment: Judgment normal.          Patient has been counseled extensively about nutrition and exercise as well as the importance of adherence with medications and regular follow-up. The patient was given clear instructions to go to ER or return to medical center if symptoms don't improve, worsen or new problems develop. The patient verbalized understanding.   Follow-up: Return in about 4 weeks (around 12/23/2020) for [place] BP CHECK and CMP. See me in 3 months.  Gildardo Pounds, FNP-BC [facility] and [facility] [place], [place]   11/26/2020, 7:21 PM

## 2020-11-25 NOTE — Progress Notes (Signed)
[id number]  a1c-12.2

## 2020-11-26 ENCOUNTER — Encounter: Payer: Self-pay | Admitting: Nurse Practitioner

## 2020-11-26 LAB — HCV INTERPRETATION

## 2020-11-26 LAB — LIPID PANEL
Chol/HDL Ratio: 3 ratio (ref 0.0–5.0)
Cholesterol, Total: 131 mg/dL (ref 100–199)
HDL: 44 mg/dL (ref >39)
LDL Chol Calc (NIH): 74 mg/dL (ref 0–99)
Triglycerides: 61 mg/dL (ref 0–149)
VLDL Cholesterol Cal: 13 mg/dL (ref 5–40)

## 2020-11-26 LAB — MICROALBUMIN / CREATININE URINE RATIO
Creatinine, Urine: 173.4 mg/dL
Microalb/Creat Ratio: 17 mg/g creat (ref 0–29)
Microalbumin, Urine: 29.4 ug/mL

## 2020-11-26 LAB — HIV ANTIBODY (ROUTINE TESTING W REFLEX): HIV Screen 4th Generation wRfx: NONREACTIVE

## 2020-11-26 LAB — HCV AB W REFLEX TO QUANT PCR: HCV Ab: <0.1 s/co ratio (ref 0.0–0.9)

## 2020-12-08 MED FILL — JARDIANCE 10 MG TABLET: 10 | 30 days supply | Qty: 30 | Fill #1

## 2020-12-22 ENCOUNTER — Other Ambulatory Visit: Payer: Self-pay

## 2020-12-22 ENCOUNTER — Telehealth: Payer: Self-pay | Admitting: Nurse Practitioner

## 2020-12-22 MED FILL — Lisinopril Tab 20 MG: ORAL | 30 days supply | Qty: 30 | Fill #0 | Status: AC

## 2020-12-22 NOTE — Telephone Encounter (Signed)
Called patient, patients son answered the phone. I asked if patients son coordinated his appointments or if I needed to speak directly with patient. Patients son stated I needed to speak directly to patient. I asked patients son to leave a message with father that his appointment with [place] for tomorrow had been cancelled due to the provider being out of the office and asked for him to return our call at [phone] to re-schedule.

## 2020-12-23 ENCOUNTER — Other Ambulatory Visit: Payer: Self-pay

## 2020-12-23 ENCOUNTER — Ambulatory Visit: Payer: Self-pay | Admitting: Pharmacist

## 2020-12-29 ENCOUNTER — Ambulatory Visit: Payer: Self-pay | Admitting: Pharmacist

## 2021-01-03 ENCOUNTER — Other Ambulatory Visit: Payer: Self-pay

## 2021-01-05 ENCOUNTER — Other Ambulatory Visit: Payer: Self-pay

## 2021-01-05 MED FILL — Metformin HCl Tab 500 MG: ORAL | 30 days supply | Qty: 120 | Fill #0 | Status: AC

## 2021-01-05 MED FILL — Empagliflozin Tab 10 MG: ORAL | 30 days supply | Qty: 30 | Fill #0 | Status: AC

## 2021-01-05 MED FILL — Glucose Blood Test Strip: 25 days supply | Qty: 100 | Fill #0 | Status: AC

## 2021-01-06 ENCOUNTER — Other Ambulatory Visit: Payer: Self-pay

## 2021-02-02 ENCOUNTER — Other Ambulatory Visit: Payer: Self-pay

## 2021-02-02 ENCOUNTER — Other Ambulatory Visit: Payer: Self-pay | Admitting: Pharmacist

## 2021-02-02 MED ORDER — EMPAGLIFLOZIN 10 MG PO TABS
ORAL_TABLET | ORAL | 2 refills | Status: DC
  Filled 2021-02-02: qty 30, 30d supply, fill #0
  Filled 2021-03-02: qty 30, 30d supply, fill #1
  Filled 2021-03-30: qty 30, 30d supply, fill #2

## 2021-02-02 MED FILL — Metformin HCl Tab 500 MG: ORAL | 30 days supply | Qty: 120 | Fill #1 | Status: AC

## 2021-02-02 MED FILL — Lisinopril Tab 20 MG: ORAL | 30 days supply | Qty: 30 | Fill #1 | Status: AC

## 2021-02-02 MED FILL — Empagliflozin Tab 10 MG: ORAL | 30 days supply | Qty: 30 | Fill #1 | Status: CN

## 2021-02-02 MED FILL — Glucose Blood Test Strip: 25 days supply | Qty: 100 | Fill #1 | Status: CN

## 2021-02-03 ENCOUNTER — Other Ambulatory Visit: Payer: Self-pay

## 2021-02-03 MED FILL — Lancets: 25 days supply | Qty: 100 | Fill #0 | Status: AC

## 2021-02-27 ENCOUNTER — Ambulatory Visit: Payer: Self-pay | Attending: Nurse Practitioner | Admitting: Nurse Practitioner

## 2021-02-27 ENCOUNTER — Encounter: Payer: Self-pay | Admitting: Nurse Practitioner

## 2021-02-27 ENCOUNTER — Other Ambulatory Visit: Payer: Self-pay

## 2021-02-27 VITALS — BP 129/90 | HR 107 | Resp 16 | Ht 68.0 in | Wt 213.0 lb

## 2021-02-27 DIAGNOSIS — R079 Chest pain, unspecified: Secondary | ICD-10-CM

## 2021-02-27 DIAGNOSIS — I10 Essential (primary) hypertension: Secondary | ICD-10-CM

## 2021-02-27 DIAGNOSIS — E1165 Type 2 diabetes mellitus with hyperglycemia: Secondary | ICD-10-CM

## 2021-02-27 LAB — POCT GLYCOSYLATED HEMOGLOBIN (HGB A1C): Hemoglobin A1C: 6.3 % — AB (ref 4.0–5.6)

## 2021-02-27 LAB — GLUCOSE, POCT (MANUAL RESULT ENTRY): POC Glucose: 80 mg/dl (ref 70–99)

## 2021-02-27 MED ORDER — METFORMIN HCL 500 MG PO TABS
500.0000 mg | ORAL_TABLET | Freq: Two times a day (BID) | ORAL | 1 refills | Status: DC
  Filled 2021-02-27: qty 60, 30d supply, fill #0
  Filled 2021-03-30: qty 60, 30d supply, fill #1
  Filled 2021-06-08: qty 60, 30d supply, fill #2
  Filled 2021-07-06: qty 60, 30d supply, fill #3
  Filled 2021-08-24: qty 60, 30d supply, fill #4
  Filled 2021-10-12: qty 60, 30d supply, fill #0
  Filled 2021-10-12: qty 60, 30d supply, fill #5

## 2021-02-27 MED ORDER — LISINOPRIL 20 MG PO TABS
1.0000 | ORAL_TABLET | Freq: Every day | ORAL | 3 refills | Status: DC
  Filled 2021-02-27: qty 30, 30d supply, fill #0
  Filled 2021-03-30: qty 30, 30d supply, fill #1
  Filled 2021-04-29: qty 30, 30d supply, fill #2
  Filled 2021-06-08: qty 30, 30d supply, fill #3
  Filled 2021-07-06: qty 30, 30d supply, fill #4
  Filled 2021-08-08: qty 30, 30d supply, fill #5
  Filled 2021-09-14: qty 30, 30d supply, fill #6
  Filled 2021-10-12: qty 30, 30d supply, fill #7
  Filled 2021-10-12: qty 30, 30d supply, fill #0
  Filled 2021-11-15: qty 30, 30d supply, fill #1
  Filled 2021-12-20: qty 30, 30d supply, fill #2

## 2021-02-27 NOTE — Progress Notes (Signed)
Diabetes follow up.

## 2021-02-27 NOTE — Progress Notes (Signed)
Assessment & Plan:  Jonathan Gonzalez was seen today for diabetes.  Diagnoses and all orders for this visit:  Primary hypertension -     lisinopril (ZESTRIL) 20 MG tablet; TAKE 1 TABLET BY MOUTH ONCE A DAY Continue all antihypertensives as prescribed.  Remember to bring in your blood pressure log with you for your follow up appointment.  DASH/Mediterranean Diets are healthier choices for HTN.    Type 2 diabetes mellitus with hyperglycemia, without long-term current use of insulin (HCC) -     Glucose (CBG) -     HgB A1c -     metFORMIN (GLUCOPHAGE) 500 MG tablet; Take 1 tablet (500 mg total) by mouth 2 (two) times daily with a meal. -     Basic metabolic panel Continue blood sugar control as discussed in office today, low carbohydrate diet, and regular physical exercise as tolerated, 150 minutes per week (30 min each day, 5 days per week, or 50 min 3 days per week). Keep blood sugar logs with fasting goal of 90-130 mg/dl, post prandial (after you eat) less than 180.  For Hypoglycemia: BS <60 and Hyperglycemia BS >400; contact the clinic ASAP. Annual eye exams and foot exams are recommended.   Chest pain Schedule EKG  Patient has been counseled on age-appropriate routine health concerns for screening and prevention. These are reviewed and up-to-date. Referrals have been placed accordingly. Immunizations are up-to-date or declined.    Subjective:   Chief Complaint  Patient presents with   Diabetes   [name] 60 y.o. male presents to office today for follow up to HTN and DM he has a past medical history of DM2, Hyperlipidemia, and Hypertension.    HTN He does not monitor his blood pressure at home and states that his blood pressure monitor is no longer working.  He is currently taking lisinopril 20 mg daily as prescribed.  Endorses intermittent episodes of chest pain with activity and resolving with rest.  Chest pain can last up to 1 hour and goes away on its own.  States it is  not related to eating.  We will have him return in a few days for EKG.  He reports he was driven by someone to his appointment today and does not have time for EKG during this appointment.  Diabetes type II  Currently well controlled and A1c down from greater than 12 to currently 6.3.  We will decrease his metformin from 1000 mg twice daily to 500 mg twice daily and he will continue on Jardiance 10 mg daily.  He reports fasting readings 80s to 100s and postprandial readings 90 or less.  He endorses significant dietary modifications.  Denies any symptoms of hypo or hyperglycemia. Lab Results  Component Value Date   HGBA1C 6.3 (A) 02/27/2021   Lab Results  Component Value Date   LDLCALC 74 11/25/2020     Review of Systems  Constitutional:  Negative for fever, malaise/fatigue and weight loss.  HENT: Negative.  Negative for nosebleeds.   Eyes: Negative.  Negative for blurred vision, double vision and photophobia.  Respiratory: Negative.  Negative for cough and shortness of breath.   Cardiovascular:  Positive for chest pain (See HPI). Negative for palpitations and leg swelling.  Gastrointestinal: Negative.  Negative for heartburn, nausea and vomiting.  Musculoskeletal: Negative.  Negative for myalgias.  Neurological: Negative.  Negative for dizziness, focal weakness, seizures and headaches.  Psychiatric/Behavioral: Negative.  Negative for suicidal ideas.    Past Medical History:  Diagnosis  Date   Diabetes mellitus without complication ([place])    Hyperlipidemia    Hypertension     Past Surgical History:  Procedure Laterality Date   HERNIA REPAIR      No family history on file.  Social History Reviewed with no changes to be made today.   Outpatient Medications Prior to Visit  Medication Sig Dispense Refill   Aspirin-Salicylamide-Caffeine (BC HEADACHE POWDER PO) Take 1 Package by mouth as needed (headache/pain).     Blood Glucose Monitoring Suppl (TRUE METRIX METER) w/Device KIT Use  as instructed. Check blood glucose level by fingerstick twice per day. E11.65 1 kit 0   empagliflozin (JARDIANCE) 10 MG TABS tablet TAKE 1 TABLET BY MOUTH ONCE A DAY BEFORE BREAKFAST. 30 tablet 2   glucose blood (TRUE METRIX BLOOD GLUCOSE TEST) test strip Use as instructed. Check blood glucose level by fingerstick twice per day. NEEDS PASS 100 each 12   glucose blood test strip USE AS INSTRUCTED.CHECK BLOOD GLUCOSE LEVEL BY FINGERSTICK TWICE PER DAY 100 strip 12   TRUEplus Lancets 28G MISC USE AS INSTRUCTED.CHECK BLOOD GLUCOSE LEVEL BY FINGERSTICK TWICE PER DAY 100 each 3   lisinopril (ZESTRIL) 20 MG tablet TAKE 1 TABLET BY MOUTH ONCE A DAY 90 tablet 3   metFORMIN (GLUCOPHAGE) 500 MG tablet TAKE 2 TABLETS BY MOUTH 2 TIMES DAILY WITH A MEAL 360 tablet 1   No facility-administered medications prior to visit.    No Known Allergies     Objective:    BP 129/90 (BP Location: Left Arm)   Pulse (!) 107   Resp 16   Wt 213 lb (96.6 kg)   SpO2 98%   BMI 32.39 kg/m  Wt Readings from Last 3 Encounters:  02/27/21 213 lb (96.6 kg)  11/25/20 230 lb 12.8 oz (104.7 kg)  11/04/20 230 lb (104.3 kg)    Physical Exam Vitals and nursing note reviewed.  Constitutional:      Appearance: He is well-developed.  HENT:     Head: Normocephalic and atraumatic.  Cardiovascular:     Rate and Rhythm: Normal rate and regular rhythm.     Heart sounds: Normal heart sounds. No murmur heard.   No friction rub. No gallop.  Pulmonary:     Effort: Pulmonary effort is normal. No tachypnea or respiratory distress.     Breath sounds: Normal breath sounds. No decreased breath sounds, wheezing, rhonchi or rales.  Chest:     Chest wall: No tenderness.  Abdominal:     General: Bowel sounds are normal.     Palpations: Abdomen is soft.  Musculoskeletal:        General: Normal range of motion.     Cervical back: Normal range of motion.  Skin:    General: Skin is warm and dry.  Neurological:     Mental Status: He is  alert and oriented to person, place, and time.     Coordination: Coordination normal.  Psychiatric:        Behavior: Behavior normal. Behavior is cooperative.        Thought Content: Thought content normal.        Judgment: Judgment normal.         Patient has been counseled extensively about nutrition and exercise as well as the importance of adherence with medications and regular follow-up. The patient was given clear instructions to go to ER or return to medical center if symptoms don't improve, worsen or new problems develop. The patient verbalized understanding.  Follow-up: Return for schedule 03-03-2021 8:50 on my schedule. needs EKG .   Gildardo Pounds, FNP-BC [facility] and [place], [place]   02/27/2021, 4:31 PM

## 2021-02-28 LAB — BASIC METABOLIC PANEL
BUN/Creatinine Ratio: 12 (ref 10–24)
BUN: 13 mg/dL (ref 8–27)
CO2: 21 mmol/L (ref 20–29)
Calcium: 10.1 mg/dL (ref 8.6–10.2)
Chloride: 101 mmol/L (ref 96–106)
Creatinine, Ser: 1.11 mg/dL (ref 0.76–1.27)
Glucose: 77 mg/dL (ref 65–99)
Potassium: 4.8 mmol/L (ref 3.5–5.2)
Sodium: 139 mmol/L (ref 134–144)
eGFR: 76 mL/min/{1.73_m2} (ref >59)

## 2021-03-02 ENCOUNTER — Other Ambulatory Visit: Payer: Self-pay

## 2021-03-03 ENCOUNTER — Ambulatory Visit: Payer: Self-pay | Admitting: Nurse Practitioner

## 2021-03-03 ENCOUNTER — Other Ambulatory Visit: Payer: Self-pay

## 2021-03-24 ENCOUNTER — Other Ambulatory Visit: Payer: Self-pay

## 2021-03-30 ENCOUNTER — Other Ambulatory Visit: Payer: Self-pay

## 2021-03-31 ENCOUNTER — Other Ambulatory Visit: Payer: Self-pay

## 2021-04-26 ENCOUNTER — Ambulatory Visit: Payer: Self-pay | Attending: Nurse Practitioner | Admitting: Nurse Practitioner

## 2021-04-26 ENCOUNTER — Telehealth: Payer: Self-pay | Admitting: Nurse Practitioner

## 2021-04-26 ENCOUNTER — Other Ambulatory Visit: Payer: Self-pay

## 2021-04-26 NOTE — Telephone Encounter (Signed)
Patient's son answered phone. States mr. Steil is at work and unavailable. He did no show for his EKG today.

## 2021-04-27 ENCOUNTER — Telehealth: Payer: Self-pay | Admitting: Nurse Practitioner

## 2021-04-27 NOTE — Telephone Encounter (Signed)
Copied from CRM [id number]. Topic: Appointment Scheduling - Scheduling Inquiry for Clinic >> Apr 26, 2021  5:52 PM Randol Kern wrote: Reason for CRM: Pt called back to report that he was not contacted on the phone number he listed for his appt today, please advise. Needs to be seen as soon possible.   Needs to be seen for refills for  [id number]  empagliflozin (JARDIANCE) 10 MG TABS tablet  Says he is running low   Called patient and left vm to call back to reschedule. Will patient need to wait for an appointment to receive refill?

## 2021-04-28 ENCOUNTER — Other Ambulatory Visit: Payer: Self-pay

## 2021-04-28 ENCOUNTER — Other Ambulatory Visit: Payer: Self-pay | Admitting: Nurse Practitioner

## 2021-04-28 MED ORDER — EMPAGLIFLOZIN 10 MG PO TABS
ORAL_TABLET | ORAL | 2 refills | Status: DC
  Filled 2021-04-28: qty 30, 30d supply, fill #0
  Filled 2021-06-08 (×2): qty 30, 30d supply, fill #1
  Filled 2021-07-06: qty 30, 30d supply, fill #2

## 2021-04-28 NOTE — Telephone Encounter (Signed)
Jonathan Gonzalez has been refilled. Please see telephone message from 04-26-21. His son answered the phone and told me the patient was at work and could not be contacted.  He also missed his EKG.

## 2021-05-01 ENCOUNTER — Other Ambulatory Visit: Payer: Self-pay

## 2021-06-08 ENCOUNTER — Other Ambulatory Visit: Payer: Self-pay

## 2021-06-09 ENCOUNTER — Other Ambulatory Visit: Payer: Self-pay

## 2021-06-23 ENCOUNTER — Other Ambulatory Visit: Payer: Self-pay

## 2021-07-06 ENCOUNTER — Other Ambulatory Visit: Payer: Self-pay

## 2021-07-07 ENCOUNTER — Other Ambulatory Visit: Payer: Self-pay

## 2021-07-28 ENCOUNTER — Ambulatory Visit: Payer: Self-pay | Admitting: *Deleted

## 2021-07-28 NOTE — Telephone Encounter (Signed)
Decrease metformin to 500 mg once at dinner. Take jardiance in the am. Needs to see an eye doctor. This will be out of pocket as it is not covered with the orange card or CAFA. See if Marylene Land or Cari have any openings.

## 2021-07-28 NOTE — Telephone Encounter (Signed)
Reason for Disposition  [1] Brief (now gone) blurred vision AND [2] unexplained  Answer Assessment - Initial Assessment Questions 1. SYMPTOMS: "What symptoms are you concerned about?"     Low blood sugars, blurred vision on and off. Weakness and nausea 2. ONSET:  "When did the symptoms start?"     2-3 weeks ago  3. BLOOD GLUCOSE: "What is your blood glucose level?"      90 4. USUAL RANGE: "What is your blood glucose level usually?" (e.g., usual fasting morning value, usual evening value)     80-90's 5. TYPE 1 or 2:  "Do you know what type of diabetes you have?"  (e.g., Type 1, Type 2, Gestational; doesn't know)      na 6. INSULIN: "Do you take insulin?" "What type of insulin(s) do you use? What is the mode of delivery? (syringe, pen; injection or pump) "When did you last give yourself an insulin dose?" (i.e., time or hours/minutes ago) "How much did you give?" (i.e., how many units)     na 7. DIABETES PILLS: "Do you take any pills for your diabetes?"     Metformin, jardiance  8. OTHER SYMPTOMS: "Do you have any symptoms?" (e.g., fever, frequent urination, difficulty breathing, vomiting)     Nausea, weakness, shaking at times,  9. LOW BLOOD GLUCOSE TREATMENT: "What have you done so far to treat the low blood glucose level?"     Eat a pack of nabs when feeling weak 10. FOOD: "When did you last eat or drink?"       This am  11. ALONE: "Are you alone right now or is someone with you?"        na 12. PREGNANCY: "Is there any chance you are pregnant?" "When was your last menstrual period?"       na  Answer Assessment - Initial Assessment Questions 1. DESCRIPTION: "What is the vision loss like? Describe it for me." (e.g., complete vision loss, blurred vision, double vision, floaters, etc.)     Blurred vision at times not now  2. LOCATION: "One or both eyes?" If one, ask: "Which eye?"     na 3. SEVERITY: "Can you see anything?" If Yes, ask: "What can you see?" (e.g., fine print)     Looks  at a clock and notices it is blurry  4. ONSET: "When did this begin?" "Did it start suddenly or has this been gradual?"     2-3 weeks ago  5. PATTERN: "Does this come and go, or has it been constant since it started?"     On and off at times  6. PAIN: "Is there any pain in your eye(s)?"  (Scale 1-10; or mild, moderate, severe)     na 7. CONTACTS-GLASSES: "Do you wear contacts or glasses?"     na 8. CAUSE: "What do you think is causing this visual problem?"     Low blood sugars  9. OTHER SYMPTOMS: "Do you have any other symptoms?" (e.g., confusion, headache, arm or leg weakness, speech problems)     None now . C/o weakness and nausea  10. PREGNANCY: "Is there any chance you are pregnant?" "When was your last menstrual period?"       na  Protocols used: Diabetes - Low Blood Sugar-A-AH, Vision Loss or Change-A-AH

## 2021-07-28 NOTE — Telephone Encounter (Signed)
Patient requesting appt for low blood sugars and episodes of blurred vision. C/o blurred vision on and off for 2-3 weeks ago . C/o he has episodes of weakness, nausea, but does not vomit, shakiness. Patient contributes symptoms to low blood sugar and reports he does not eat enough during work hours. Blood sugar this am at 0900 for 90. Highest readings reported are 109-110 and lowest at times are in the 80's . Patient reports some reading have been lower than 80. Please advise . Earliest appt scheduled for 10/02/21. Care advise given. Patient verbalized understanding of care advise and to call back or go to [facility] or ED if symptoms worsen.

## 2021-07-31 NOTE — Telephone Encounter (Signed)
Called pt , pt hang up the call. Unable to deliver message

## 2021-08-01 NOTE — Telephone Encounter (Signed)
Unable to reach. Left message to return call with male that states pt is at work.

## 2021-08-01 NOTE — Telephone Encounter (Signed)
Pt returned call, message read to patient, verbalizes understanding. States he will need refill of Jardiance on 08/06/21. Did not refill, unsure if pt will secure appt before then and have med adjustments. Please review. Thank you.

## 2021-08-08 ENCOUNTER — Other Ambulatory Visit: Payer: Self-pay | Admitting: Nurse Practitioner

## 2021-08-08 ENCOUNTER — Other Ambulatory Visit: Payer: Self-pay

## 2021-08-08 NOTE — Telephone Encounter (Signed)
Requested medication (s) are due for refill today: yes  Requested medication (s) are on the active medication list: yes   Last refill:  /12/22 -04/28/22 #30 2 refills  Future visit scheduled: yes in 1 month   Notes to clinic:  [id number]     Requested Prescriptions  Pending Prescriptions Disp Refills   empagliflozin (JARDIANCE) 10 MG TABS tablet 30 tablet 2    Sig: TAKE 1 TABLET BY MOUTH ONCE A DAY BEFORE BREAKFAST.     Endocrinology:  Diabetes - SGLT2 Inhibitors Failed - 08/08/2021 12:07 PM      Failed - AA eGFR in normal range and within 360 days    GFR, Estimated  Date Value Ref Range Status  11/04/2020 >60 >60 mL/min Final    Comment:    (NOTE) Calculated using the CKD-EPI Creatinine Equation (2021)    eGFR  Date Value Ref Range Status  02/27/2021 76 >59 mL/min/1.73 Final          Passed - Cr in normal range and within 360 days    Creatinine, Ser  Date Value Ref Range Status  02/27/2021 1.11 0.76 - 1.27 mg/dL Final          Passed - LDL in normal range and within 360 days    LDL Chol Calc (NIH)  Date Value Ref Range Status  11/25/2020 74 0 - 99 mg/dL Final          Passed - HBA1C is between 0 and 7.9 and within 180 days    Hemoglobin A1C  Date Value Ref Range Status  02/27/2021 6.3 (A) 4.0 - 5.6 % Final   HbA1c, POC (controlled diabetic range)  Date Value Ref Range Status  11/25/2020 12.2 (A) 0.0 - 7.0 % Final          Passed - Valid encounter within last 6 months    Recent Outpatient Visits           5 months ago Primary hypertension   [facility] Fleming, Zelda W, NP   8 months ago Type 2 diabetes mellitus with hyperglycemia, without long-term current use of insulin (HCC)   [facility] Fleming, Zelda W, NP       Future Appointments             In 1 month Fleming, Zelda W, NP [facility]             

## 2021-08-14 ENCOUNTER — Other Ambulatory Visit: Payer: Self-pay

## 2021-08-14 MED ORDER — EMPAGLIFLOZIN 10 MG PO TABS
ORAL_TABLET | ORAL | 2 refills | Status: DC
  Filled 2021-08-14: qty 30, 30d supply, fill #0
  Filled 2021-09-14: qty 30, 30d supply, fill #1
  Filled 2021-10-12: qty 30, 30d supply, fill #2
  Filled 2021-10-12: qty 30, 30d supply, fill #0

## 2021-08-24 ENCOUNTER — Other Ambulatory Visit: Payer: Self-pay

## 2021-09-14 ENCOUNTER — Other Ambulatory Visit: Payer: Self-pay

## 2021-09-15 ENCOUNTER — Other Ambulatory Visit: Payer: Self-pay

## 2021-10-02 ENCOUNTER — Ambulatory Visit: Payer: Self-pay | Admitting: Nurse Practitioner

## 2021-10-12 ENCOUNTER — Other Ambulatory Visit: Payer: Self-pay

## 2021-10-13 ENCOUNTER — Other Ambulatory Visit: Payer: Self-pay

## 2021-10-30 ENCOUNTER — Other Ambulatory Visit: Payer: Self-pay

## 2021-11-15 ENCOUNTER — Other Ambulatory Visit: Payer: Self-pay

## 2021-11-15 ENCOUNTER — Other Ambulatory Visit: Payer: Self-pay | Admitting: Nurse Practitioner

## 2021-11-15 MED ORDER — EMPAGLIFLOZIN 10 MG PO TABS
ORAL_TABLET | ORAL | 0 refills | Status: AC
  Filled 2021-11-15: qty 30, 30d supply, fill #0

## 2021-11-15 NOTE — Telephone Encounter (Signed)
Requested medications are due for refill today.  Unsure ? ?Requested medications are on the active medications list.  no ? ?Last refill. 08/14/2021 #30 2 refills ? ?Future visit scheduled.   yes ? ?Notes to clinic.  Medication not on med list. ? ? ? ?Requested Prescriptions  ?Pending Prescriptions Disp Refills  ? empagliflozin (JARDIANCE) 10 MG TABS tablet 30 tablet 2  ?  Sig: TAKE 1 TABLET BY MOUTH ONCE A DAY BEFORE BREAKFAST.  ?  ? Endocrinology:  Diabetes - SGLT2 Inhibitors Failed - 11/15/2021  4:58 AM  ?  ?  Failed - HBA1C is between 0 and 7.9 and within 180 days  ?  Hemoglobin A1C  ?Date Value Ref Range Status  ?02/27/2021 6.3 (A) 4.0 - 5.6 % Final  ? ?HbA1c, POC (controlled diabetic range)  ?Date Value Ref Range Status  ?11/25/2020 12.2 (A) 0.0 - 7.0 % Final  ?  ?  ?  ?  Failed - Valid encounter within last 6 months  ?  Recent Outpatient Visits   ? ?      ? 8 months ago Primary hypertension  ? K[place]et[place]-La[place], [place] W, NP  ? 11 months ago Type 2 diabetes mellitus with hyperglycemia, without long-term current use of insulin ([place])  ? [place] Gildardo Pounds, NP  ? ?  ?  ?Future Appointments   ? ?        ? In 2 weeks Gildardo Pounds, NP [place]  ? ?  ? ?  ?  ?  Passed - Cr in normal range and within 360 days  ?  Creatinine, Ser  ?Date Value Ref Range Status  ?02/27/2021 1.11 0.76 - 1.27 mg/dL Final  ?  ?  ?  ?  Passed - eGFR in normal range and within 360 days  ?  GFR, Estimated  ?Date Value Ref Range Status  ?11/04/2020 >60 >60 mL/min Final  ?  Comment:  ?  (NOTE) ?Calculated using the CKD-EPI Creatinine Equation (2021) ?  ? ?eGFR  ?Date Value Ref Range Status  ?02/27/2021 76 >59 mL/min/1.73 Final  ?  ?  ?  ?  ?  ?

## 2021-11-17 ENCOUNTER — Other Ambulatory Visit: Payer: Self-pay

## 2021-12-01 ENCOUNTER — Ambulatory Visit: Payer: Self-pay | Admitting: Nurse Practitioner

## 2021-12-20 ENCOUNTER — Other Ambulatory Visit: Payer: Self-pay

## 2021-12-20 ENCOUNTER — Other Ambulatory Visit: Payer: Self-pay | Admitting: Nurse Practitioner

## 2021-12-20 DIAGNOSIS — E1165 Type 2 diabetes mellitus with hyperglycemia: Secondary | ICD-10-CM

## 2021-12-21 ENCOUNTER — Other Ambulatory Visit: Payer: Self-pay | Admitting: Family Medicine

## 2021-12-21 ENCOUNTER — Other Ambulatory Visit: Payer: Self-pay

## 2021-12-22 ENCOUNTER — Other Ambulatory Visit: Payer: Self-pay

## 2021-12-22 ENCOUNTER — Other Ambulatory Visit: Payer: Self-pay | Admitting: Nurse Practitioner

## 2021-12-22 DIAGNOSIS — E1165 Type 2 diabetes mellitus with hyperglycemia: Secondary | ICD-10-CM

## 2021-12-22 DIAGNOSIS — I10 Essential (primary) hypertension: Secondary | ICD-10-CM

## 2021-12-22 NOTE — Telephone Encounter (Signed)
Requested medication (s) are due for refill today: yes ? ?Requested medication (s) are on the active medication list: yes ? ?Last refill:  02/27/21 for lisinopril and metforimin, Jardiance was dc'd on 08/14/21 (I think by mistake for Reorder) ? ?Future visit scheduled: yes ? ?Notes to clinic:  Unable to refill per protocol due to failed labs, no updated results.. pt has appt on 03/12/22 ? ? ?  ?Requested Prescriptions  ?Pending Prescriptions Disp Refills  ? lisinopril (ZESTRIL) 20 MG tablet 90 tablet 3  ?  Sig: TAKE 1 TABLET BY MOUTH ONCE A DAY  ?  ? Cardiovascular:  ACE Inhibitors Failed - 12/22/2021 11:20 AM  ?  ?  Failed - Cr in normal range and within 180 days  ?  Creatinine, Ser  ?Date Value Ref Range Status  ?02/27/2021 1.11 0.76 - 1.27 mg/dL Final  ?  ?  ?  ?  Failed - K in normal range and within 180 days  ?  Potassium  ?Date Value Ref Range Status  ?02/27/2021 4.8 3.5 - 5.2 mmol/L Final  ?  ?  ?  ?  Failed - Last BP in normal range  ?  BP Readings from Last 1 Encounters:  ?02/27/21 129/90  ?  ?  ?  ?  Failed - Valid encounter within last 6 months  ?  Recent Outpatient Visits   ? ?      ? 9 months ago Primary hypertension  ? [place], [place] W, NP  ? 1 year ago Type 2 diabetes mellitus with hyperglycemia, without long-term current use of insulin ([place])  ? [place] Gildardo Pounds, NP  ? ?  ?  ?Future Appointments   ? ?        ? In 2 months Gildardo Pounds, NP [place]  ? ?  ? ?  ?  ?  Passed - Patient is not pregnant  ?  ?  ? metFORMIN (GLUCOPHAGE) 500 MG tablet 180 tablet 1  ?  Sig: Take 1 tablet (500 mg total) by mouth 2 (two) times daily with a meal.  ?  ? Endocrinology:  Diabetes - Biguanides Failed - 12/22/2021 11:20 AM  ?  ?  Failed - HBA1C is between 0 and 7.9 and within 180 days  ?  Hemoglobin A1C  ?Date Value Ref Range Status  ?02/27/2021 6.3 (A) 4.0 - 5.6 % Final  ? ?HbA1c, POC (controlled diabetic  range)  ?Date Value Ref Range Status  ?11/25/2020 12.2 (A) 0.0 - 7.0 % Final  ?  ?  ?  ?  Failed - B12 Level in normal range and within 720 days  ?  No results found for: VITAMINB12  ?  ?  ?  Failed - Valid encounter within last 6 months  ?  Recent Outpatient Visits   ? ?      ? 9 months ago Primary hypertension  ? [place], [place] W, NP  ? 1 year ago Type 2 diabetes mellitus with hyperglycemia, without long-term current use of insulin ([place])  ? [place] Gildardo Pounds, NP  ? ?  ?  ?Future Appointments   ? ?        ? In 2 months Gildardo Pounds, NP [place]  ? ?  ? ?  ?  ?  Failed -  CBC within normal limits and completed in the last 12 months  ?  WBC  ?Date Value Ref Range Status  ?11/04/2020 9.5 4.0 - 10.5 K/uL Final  ? ?RBC  ?Date Value Ref Range Status  ?11/04/2020 5.80 4.22 - 5.81 MIL/uL Final  ? ?Hemoglobin  ?Date Value Ref Range Status  ?11/04/2020 14.2 13.0 - 17.0 g/dL Final  ? ?HCT  ?Date Value Ref Range Status  ?11/04/2020 44.0 39.0 - 52.0 % Final  ? ?MCHC  ?Date Value Ref Range Status  ?11/04/2020 32.3 30.0 - 36.0 g/dL Final  ? ?MCH  ?Date Value Ref Range Status  ?11/04/2020 24.5 (L) 26.0 - 34.0 pg Final  ? ?MCV  ?Date Value Ref Range Status  ?11/04/2020 75.9 (L) 80.0 - 100.0 fL Final  ? ?No results found for: PLTCOUNTKUC, LABPLAT, [place] ?RDW  ?Date Value Ref Range Status  ?11/04/2020 19.5 (H) 11.5 - 15.5 % Final  ? ?  ?  ?  Passed - Cr in normal range and within 360 days  ?  Creatinine, Ser  ?Date Value Ref Range Status  ?02/27/2021 1.11 0.76 - 1.27 mg/dL Final  ?  ?  ?  ?  Passed - eGFR in normal range and within 360 days  ?  GFR, Estimated  ?Date Value Ref Range Status  ?11/04/2020 >60 >60 mL/min Final  ?  Comment:  ?  (NOTE) ?Calculated using the CKD-EPI Creatinine Equation (2021) ?  ? ?eGFR  ?Date Value Ref Range Status  ?02/27/2021 76 >59 mL/min/1.73 Final  ?  ?  ?  ?  ? empagliflozin  (JARDIANCE) 10 MG TABS tablet 30 tablet 2  ?  Sig: Take 1 tablet (10 mg total) by mouth daily before breakfast. NEEDS PASS  ?  ? Endocrinology:  Diabetes - SGLT2 Inhibitors Failed - 12/22/2021 11:20 AM  ?  ?  Failed - HBA1C is between 0 and 7.9 and within 180 days  ?  Hemoglobin A1C  ?Date Value Ref Range Status  ?02/27/2021 6.3 (A) 4.0 - 5.6 % Final  ? ?HbA1c, POC (controlled diabetic range)  ?Date Value Ref Range Status  ?11/25/2020 12.2 (A) 0.0 - 7.0 % Final  ?  ?  ?  ?  Failed - Valid encounter within last 6 months  ?  Recent Outpatient Visits   ? ?      ? 9 months ago Primary hypertension  ? [place], [place] W, NP  ? 1 year ago Type 2 diabetes mellitus with hyperglycemia, without long-term current use of insulin ([place])  ? [place] Gildardo Pounds, NP  ? ?  ?  ?Future Appointments   ? ?        ? In 2 months Gildardo Pounds, NP [place]  ? ?  ? ?  ?  ?  Passed - Cr in normal range and within 360 days  ?  Creatinine, Ser  ?Date Value Ref Range Status  ?02/27/2021 1.11 0.76 - 1.27 mg/dL Final  ?  ?  ?  ?  Passed - eGFR in normal range and within 360 days  ?  GFR, Estimated  ?Date Value Ref Range Status  ?11/04/2020 >60 >60 mL/min Final  ?  Comment:  ?  (NOTE) ?Calculated using the CKD-EPI Creatinine Equation (2021) ?  ? ?eGFR  ?Date Value Ref Range Status  ?02/27/2021 76 >59 mL/min/1.73 Final  ?  ?  ?  ?  ? ?

## 2021-12-22 NOTE — Telephone Encounter (Signed)
Medication Refill - Medication: lisinopril (ZESTRIL) 20 MG tablet, metFORMIN (GLUCOPHAGE) 500 MG tablet and empagliflozin (JARDIANCE) 10 MG TABS tablet  ? ?Has the patient contacted their pharmacy? Yes.   ? ?(Agent: If yes, when and what did the pharmacy advise?) Contact PCP office and schedule appointment. Patient is not scheduled with PCP for 03/12/2022 but patient would like a short supply to hold him over. Patient states the reason he cancelled his last appointment because of a moving situation.   ? ?Preferred Pharmacy (with phone number or street name):  ?[facility] Phone:  [phone]  ?Fax:  [phone]  ?  ? ? ? ?Has the patient been seen for an appointment in the last year OR does the patient have an upcoming appointment? Yes.   ? ?Agent: Please be advised that RX refills may take up to 3 business days. We ask that you follow-up with your pharmacy. ?

## 2021-12-24 MED ORDER — EMPAGLIFLOZIN 10 MG PO TABS
10.0000 mg | ORAL_TABLET | Freq: Every day | ORAL | 2 refills | Status: AC
  Filled 2021-12-24 – 2022-01-04 (×2): qty 60, 60d supply, fill #0

## 2021-12-24 MED ORDER — LISINOPRIL 20 MG PO TABS
20.0000 mg | ORAL_TABLET | Freq: Every day | ORAL | 2 refills | Status: DC
  Filled 2021-12-24 – 2022-01-04 (×2): qty 30, 30d supply, fill #0
  Filled 2022-02-16: qty 30, 30d supply, fill #1

## 2021-12-24 MED ORDER — METFORMIN HCL 500 MG PO TABS
500.0000 mg | ORAL_TABLET | Freq: Two times a day (BID) | ORAL | 2 refills | Status: DC
  Filled 2021-12-24 – 2022-01-04 (×2): qty 180, 90d supply, fill #0

## 2021-12-25 ENCOUNTER — Other Ambulatory Visit: Payer: Self-pay

## 2021-12-26 ENCOUNTER — Other Ambulatory Visit: Payer: Self-pay

## 2022-01-01 ENCOUNTER — Other Ambulatory Visit: Payer: Self-pay

## 2022-01-04 ENCOUNTER — Other Ambulatory Visit: Payer: Self-pay

## 2022-01-05 ENCOUNTER — Other Ambulatory Visit: Payer: Self-pay

## 2022-02-16 ENCOUNTER — Other Ambulatory Visit: Payer: Self-pay

## 2022-02-19 ENCOUNTER — Other Ambulatory Visit: Payer: Self-pay

## 2022-02-22 ENCOUNTER — Other Ambulatory Visit: Payer: Self-pay

## 2022-03-08 ENCOUNTER — Other Ambulatory Visit: Payer: Self-pay

## 2022-03-12 ENCOUNTER — Other Ambulatory Visit: Payer: Self-pay

## 2022-03-12 ENCOUNTER — Ambulatory Visit: Payer: Self-pay | Attending: Nurse Practitioner | Admitting: Nurse Practitioner

## 2022-03-12 ENCOUNTER — Encounter: Payer: Self-pay | Admitting: Nurse Practitioner

## 2022-03-12 VITALS — BP 138/78 | HR 86 | Temp 99.2°F | Ht 68.0 in | Wt 226.2 lb

## 2022-03-12 DIAGNOSIS — F5101 Primary insomnia: Secondary | ICD-10-CM

## 2022-03-12 DIAGNOSIS — E785 Hyperlipidemia, unspecified: Secondary | ICD-10-CM

## 2022-03-12 DIAGNOSIS — I10 Essential (primary) hypertension: Secondary | ICD-10-CM

## 2022-03-12 DIAGNOSIS — E1165 Type 2 diabetes mellitus with hyperglycemia: Secondary | ICD-10-CM

## 2022-03-12 DIAGNOSIS — K219 Gastro-esophageal reflux disease without esophagitis: Secondary | ICD-10-CM

## 2022-03-12 DIAGNOSIS — R7989 Other specified abnormal findings of blood chemistry: Secondary | ICD-10-CM

## 2022-03-12 LAB — POCT GLYCOSYLATED HEMOGLOBIN (HGB A1C): HbA1c, POC (controlled diabetic range): 5.9 % (ref 0.0–7.0)

## 2022-03-12 MED ORDER — LISINOPRIL 20 MG PO TABS
20.0000 mg | ORAL_TABLET | Freq: Every day | ORAL | 1 refills | Status: DC
  Filled 2022-03-12: qty 30, 30d supply, fill #0
  Filled 2022-04-19: qty 30, 30d supply, fill #1
  Filled 2022-05-15: qty 30, 30d supply, fill #2
  Filled 2022-06-19: qty 30, 30d supply, fill #3
  Filled 2022-07-25: qty 30, 30d supply, fill #4
  Filled 2022-08-21: qty 30, 30d supply, fill #5

## 2022-03-12 MED ORDER — HYDROXYZINE PAMOATE 25 MG PO CAPS
25.0000 mg | ORAL_CAPSULE | Freq: Every evening | ORAL | 1 refills | Status: DC
  Filled 2022-03-12: qty 60, 30d supply, fill #0
  Filled 2022-05-03: qty 60, 30d supply, fill #1

## 2022-03-12 MED ORDER — OMEPRAZOLE 20 MG PO CPDR
20.0000 mg | DELAYED_RELEASE_CAPSULE | Freq: Every day | ORAL | 3 refills | Status: DC
Start: 2022-03-12 — End: 2022-12-28
  Filled 2022-03-12: qty 30, 30d supply, fill #0

## 2022-03-12 MED ORDER — DAPAGLIFLOZIN PROPANEDIOL 5 MG PO TABS
5.0000 mg | ORAL_TABLET | Freq: Every day | ORAL | 1 refills | Status: DC
  Filled 2022-03-12: qty 30, 30d supply, fill #0
  Filled 2022-04-19: qty 30, 30d supply, fill #1
  Filled 2022-05-15: qty 30, 30d supply, fill #2
  Filled 2022-06-19: qty 30, 30d supply, fill #3
  Filled 2022-07-25: qty 30, 30d supply, fill #4
  Filled 2022-08-24: qty 30, 30d supply, fill #5

## 2022-03-12 MED ORDER — METFORMIN HCL 500 MG PO TABS
500.0000 mg | ORAL_TABLET | Freq: Two times a day (BID) | ORAL | 2 refills | Status: DC
  Filled 2022-03-12 – 2022-06-19 (×2): qty 60, 30d supply, fill #0
  Filled 2022-07-25: qty 60, 30d supply, fill #1
  Filled 2022-11-26: qty 60, 30d supply, fill #2

## 2022-03-12 NOTE — Progress Notes (Signed)
Assessment & Plan:  Jonathan Gonzalez was seen today for hypertension.  Diagnoses and all orders for this visit:  Type 2 diabetes mellitus with hyperglycemia, without long-term current use of insulin (HCC) -     POCT glycosylated hemoglobin (Hb A1C) -     CMP14+EGFR -     metFORMIN (GLUCOPHAGE) 500 MG tablet; Take 1 tablet (500 mg total) by mouth 2 (two) times daily with a meal. -     dapagliflozin propanediol (FARXIGA) 5 MG TABS tablet; Take 1 tablet (5 mg total) by mouth daily before breakfast. -     atorvastatin (LIPITOR) 10 MG tablet; Take 1 tablet (10 mg total) by mouth daily. For Cholesterol and diabetes  Primary hypertension -     CMP14+EGFR -     lisinopril (ZESTRIL) 20 MG tablet; TAKE 1 TABLET BY MOUTH ONCE A DAY  Dyslipidemia, goal LDL below 70 -     Lipid panel -     atorvastatin (LIPITOR) 10 MG tablet; Take 1 tablet (10 mg total) by mouth daily. For Cholesterol and diabetes  Abnormal CBC -     CBC with Differential  GERD without esophagitis Well controlled  -     omeprazole (PRILOSEC) 20 MG capsule; Take 1 capsule (20 mg total) by mouth daily. FOR ACID REFLUX  Primary insomnia -     hydrOXYzine (VISTARIL) 25 MG capsule; Take 1-2 capsules (25-50 mg total) by mouth at bedtime. For sleep    Patient has been counseled on age-appropriate routine health concerns for screening and prevention. These are reviewed and up-to-date. Referrals have been placed accordingly. Immunizations are up-to-date or declined.    Subjective:   Chief Complaint  Patient presents with   Hypertension   [name] 61 y.o. male presents to office today for follow up to HTN. She has a past medical history of DM2, Hyperlipidemia, and Hypertension.    HTN Blood pressure well controlled. Taking lisinopril 20 mg daily.  BP Readings from Last 3 Encounters:  03/12/22 138/78  02/27/21 129/90  11/25/20 (!) 168/108    DM 2 Well controlled with farxiga 5 mg daily and metformin 564m BID. Lab  Results  Component Value Date   HGBA1C 5.9 03/12/2022  LDL not quite at goal. Will start low dose statin.  Lab Results  Component Value Date   LDLCALC 84 03/12/2022     Requests medication for sleep and anxiety.     Review of Systems  Constitutional:  Negative for fever, malaise/fatigue and weight loss.  HENT: Negative.  Negative for nosebleeds.   Eyes: Negative.  Negative for blurred vision, double vision and photophobia.  Respiratory: Negative.  Negative for cough and shortness of breath.   Cardiovascular: Negative.  Negative for chest pain, palpitations and leg swelling.  Gastrointestinal:  Positive for heartburn. Negative for abdominal pain, blood in stool, constipation, diarrhea, melena, nausea and vomiting.  Musculoskeletal: Negative.  Negative for myalgias.  Neurological: Negative.  Negative for dizziness, focal weakness, seizures and headaches.  Psychiatric/Behavioral:  Negative for suicidal ideas. The patient is nervous/anxious and has insomnia.     Past Medical History:  Diagnosis Date   Diabetes mellitus without complication ([place]    Hyperlipidemia    Hypertension     Past Surgical History:  Procedure Laterality Date   HERNIA REPAIR      History reviewed. No pertinent family history.  Social History Reviewed with no changes to be made today.   Outpatient Medications Prior to Visit  Medication Sig Dispense  Refill   Aspirin-Salicylamide-Caffeine (BC HEADACHE POWDER PO) Take 1 Package by mouth as needed (headache/pain).     Blood Glucose Monitoring Suppl (TRUE METRIX METER) w/Device KIT Use as instructed. Check blood glucose level by fingerstick twice per day. E11.65 1 kit 0   glucose blood (TRUE METRIX BLOOD GLUCOSE TEST) test strip Use as instructed. Check blood glucose level by fingerstick twice per day. NEEDS PASS 100 each 12   lisinopril (ZESTRIL) 20 MG tablet TAKE 1 TABLET BY MOUTH ONCE A DAY 30 tablet 2   metFORMIN (GLUCOPHAGE) 500 MG tablet Take 1 tablet  (500 mg total) by mouth 2 (two) times daily with a meal. 60 tablet 2   No facility-administered medications prior to visit.    No Known Allergies     Objective:    BP 138/78   Pulse 86   Temp 99.2 F (37.3 C) (Oral)   Ht 5' 8"  (1.727 m)   Wt 226 lb 3.2 oz (102.6 kg)   SpO2 98%   BMI 34.39 kg/m  Wt Readings from Last 3 Encounters:  03/12/22 226 lb 3.2 oz (102.6 kg)  02/27/21 213 lb (96.6 kg)  11/25/20 230 lb 12.8 oz (104.7 kg)    Physical Exam Vitals and nursing note reviewed.  Constitutional:      Appearance: He is well-developed.  HENT:     Head: Normocephalic and atraumatic.  Cardiovascular:     Rate and Rhythm: Normal rate and regular rhythm.     Heart sounds: Normal heart sounds. No murmur heard.    No friction rub. No gallop.  Pulmonary:     Effort: Pulmonary effort is normal. No tachypnea or respiratory distress.     Breath sounds: Normal breath sounds. No decreased breath sounds, wheezing, rhonchi or rales.  Chest:     Chest wall: No tenderness.  Abdominal:     General: Bowel sounds are normal.     Palpations: Abdomen is soft.  Musculoskeletal:        General: Normal range of motion.     Cervical back: Normal range of motion.  Skin:    General: Skin is warm and dry.  Neurological:     Mental Status: He is alert and oriented to person, place, and time.     Coordination: Coordination normal.  Psychiatric:        Behavior: Behavior normal. Behavior is cooperative.        Thought Content: Thought content normal.        Judgment: Judgment normal.          Patient has been counseled extensively about nutrition and exercise as well as the importance of adherence with medications and regular follow-up. The patient was given clear instructions to go to ER or return to medical center if symptoms don't improve, worsen or new problems develop. The patient verbalized understanding.   Follow-up: Return in about 6 months (around 09/11/2022).   Gildardo Pounds,  FNP-BC [facility], [place]   03/20/2022, 10:01 AM

## 2022-03-13 ENCOUNTER — Other Ambulatory Visit: Payer: Self-pay

## 2022-03-13 LAB — CMP14+EGFR
ALT: 17 IU/L (ref 0–44)
AST: 23 IU/L (ref 0–40)
Albumin/Globulin Ratio: 1.6 (ref 1.2–2.2)
Albumin: 4.4 g/dL (ref 3.8–4.8)
Alkaline Phosphatase: 67 IU/L (ref 44–121)
BUN/Creatinine Ratio: 13 (ref 10–24)
BUN: 13 mg/dL (ref 8–27)
Bilirubin Total: 0.4 mg/dL (ref 0.0–1.2)
CO2: 22 mmol/L (ref 20–29)
Calcium: 9.5 mg/dL (ref 8.6–10.2)
Chloride: 104 mmol/L (ref 96–106)
Creatinine, Ser: 1.04 mg/dL (ref 0.76–1.27)
Globulin, Total: 2.8 g/dL (ref 1.5–4.5)
Glucose: 86 mg/dL (ref 70–99)
Potassium: 4.7 mmol/L (ref 3.5–5.2)
Sodium: 142 mmol/L (ref 134–144)
Total Protein: 7.2 g/dL (ref 6.0–8.5)
eGFR: 81 mL/min/{1.73_m2} (ref >59)

## 2022-03-13 LAB — LIPID PANEL
Chol/HDL Ratio: 2.8 ratio (ref 0.0–5.0)
Cholesterol, Total: 150 mg/dL (ref 100–199)
HDL: 54 mg/dL (ref >39)
LDL Chol Calc (NIH): 84 mg/dL (ref 0–99)
Triglycerides: 57 mg/dL (ref 0–149)
VLDL Cholesterol Cal: 12 mg/dL (ref 5–40)

## 2022-03-13 LAB — CBC WITH DIFFERENTIAL/PLATELET
Basophils Absolute: 0.1 10*3/uL (ref 0.0–0.2)
Basos: 1 %
EOS (ABSOLUTE): 0.1 10*3/uL (ref 0.0–0.4)
Eos: 1 %
Hematocrit: 44.1 % (ref 37.5–51.0)
Hemoglobin: 14.2 g/dL (ref 13.0–17.7)
Immature Grans (Abs): 0 10*3/uL (ref 0.0–0.1)
Immature Granulocytes: 0 %
Lymphocytes Absolute: 1.4 10*3/uL (ref 0.7–3.1)
Lymphs: 17 %
MCH: 24.8 pg — ABNORMAL LOW (ref 26.6–33.0)
MCHC: 32.2 g/dL (ref 31.5–35.7)
MCV: 77 fL — ABNORMAL LOW (ref 79–97)
Monocytes Absolute: 0.5 10*3/uL (ref 0.1–0.9)
Monocytes: 6 %
Neutrophils Absolute: 6.4 10*3/uL (ref 1.4–7.0)
Neutrophils: 75 %
Platelets: 273 10*3/uL (ref 150–450)
RBC: 5.73 x10E6/uL (ref 4.14–5.80)
RDW: 17.8 % — ABNORMAL HIGH (ref 11.6–15.4)
WBC: 8.5 10*3/uL (ref 3.4–10.8)

## 2022-03-16 ENCOUNTER — Other Ambulatory Visit: Payer: Self-pay

## 2022-03-19 ENCOUNTER — Other Ambulatory Visit: Payer: Self-pay

## 2022-03-20 ENCOUNTER — Encounter: Payer: Self-pay | Admitting: Nurse Practitioner

## 2022-03-20 MED ORDER — ATORVASTATIN CALCIUM 10 MG PO TABS
10.0000 mg | ORAL_TABLET | Freq: Every day | ORAL | 3 refills | Status: DC
  Filled 2022-03-20: qty 30, 30d supply, fill #0

## 2022-03-21 ENCOUNTER — Other Ambulatory Visit: Payer: Self-pay

## 2022-03-27 ENCOUNTER — Other Ambulatory Visit: Payer: Self-pay

## 2022-04-16 ENCOUNTER — Other Ambulatory Visit: Payer: Self-pay

## 2022-04-19 ENCOUNTER — Other Ambulatory Visit: Payer: Self-pay

## 2022-04-20 ENCOUNTER — Other Ambulatory Visit: Payer: Self-pay

## 2022-05-03 ENCOUNTER — Other Ambulatory Visit: Payer: Self-pay

## 2022-05-04 ENCOUNTER — Ambulatory Visit: Payer: Self-pay | Admitting: *Deleted

## 2022-05-04 NOTE — Telephone Encounter (Signed)
Noted patient has upcoming appointment.

## 2022-05-04 NOTE — Telephone Encounter (Signed)
  Chief Complaint: abdominal pain  Symptoms: cramping abdominal pain all over, comes and goes. Nausea, ate something Monday 04/30/22 and vomited and abdominal pain since. Frequency: 04/30/22 Pertinent Negatives: Patient denies fever, no diarrhea or constipation.  Disposition: [] ED /[x] Urgent Care (no appt availability in office) / [] Appointment(In office/virtual)/ []  [place] [facility]/ [] Home Care/ [] Refused Recommended Disposition /[] [place] Mobile [facility]/ [x]  Follow-up with PCP Additional Notes:   Patient requesting appt next week. None available until 05/12/22. Patient reports he has been taking "force factor" soft chews since working in am x 1 month and would like to know if this may be the cause. Please advise if patient can be scheduled sooner then 05/12/22. Patient would like a call back. Recommended UC/ ED if sx worsen.    Reason for Disposition  [1] MILD pain (e.g., does not interfere with normal activities) AND [2] pain comes and goes (cramps) [3] present > 48 hours  (Exception: This same abdominal pain is a chronic symptom recurrent or ongoing AND present > 4 weeks.)  Answer Assessment - Initial Assessment Questions 1. LOCATION: "Where does it hurt?"      All over abdomen 2. RADIATION: "Does the pain shoot anywhere else?" (e.g., chest, back)     Na  3. ONSET: "When did the pain begin?" (Minutes, hours or days ago)      04/30/22 4. SUDDEN: "Gradual or sudden onset?"     Sudden  5. PATTERN "Does the pain come and go, or is it constant?"    - If it comes and goes: "How long does it last?" "Do you have pain now?"     (Note: Comes and goes means the pain is intermittent. It goes away completely between bouts.)    - If constant: "Is it getting better, staying the same, or getting worse?"      (Note: Constant means the pain never goes away completely; most serious pain is constant and gets worse.)      Comes and goes esp. After eating  6. SEVERITY: "How bad is the pain?"  (e.g.,  Scale 1-10; mild, moderate, or severe)    - MILD (1-3): Doesn't interfere with normal activities, abdomen soft and not tender to touch.     - MODERATE (4-7): Interferes with normal activities or awakens from sleep, abdomen tender to touch.     - SEVERE (8-10): Excruciating pain, doubled over, unable to do any normal activities.       Severe at times causes to stop what he is doing  7. RECURRENT SYMPTOM: "Have you ever had this type of stomach pain before?" If Yes, ask: "When was the last time?" and "What happened that time?"      Yes  8. CAUSE: "What do you think is causing the stomach pain?"     Not sure  9. RELIEVING/AGGRAVATING FACTORS: "What makes it better or worse?" (e.g., antacids, bending or twisting motion, bowel movement)     na 10. OTHER SYMPTOMS: "Do you have any other symptoms?" (e.g., back pain, diarrhea, fever, urination pain, vomiting)       Nausea abdominal cramping  Protocols used: Abdominal Pain - Male-A-AH

## 2022-05-12 ENCOUNTER — Ambulatory Visit: Payer: Self-pay | Admitting: Family

## 2022-05-15 ENCOUNTER — Other Ambulatory Visit: Payer: Self-pay

## 2022-06-19 ENCOUNTER — Other Ambulatory Visit: Payer: Self-pay

## 2022-07-16 ENCOUNTER — Ambulatory Visit: Payer: Self-pay | Admitting: *Deleted

## 2022-07-16 NOTE — Telephone Encounter (Signed)
Summary: Pt has low energy and experiencing some cramping.   Pt reports that he has been has low energy and experiencing some cramping. Pt stated he feels nauseous after eating. Pt stated he is at work currently but requests that a nurse return his call. Cb# [phone]     Called pt - PT is not home yet. Person answering the phone stated they will tell him when he gets home to call back.

## 2022-07-16 NOTE — Telephone Encounter (Signed)
Summary: Pt has low energy and experiencing some cramping.   Pt reports that he has been has low energy and experiencing some cramping. Pt stated he feels nauseous after eating. Pt stated he is at work currently but requests that a nurse return his call. Cb# [phone]     Call to number provided- patient's son answered call. Unable to give clear symptoms for triage. Asked if there was number we could call patient for triage- no number given- will have patient call office after work or in am.

## 2022-07-16 NOTE — Telephone Encounter (Signed)
Summary: Pt has low energy and experiencing some cramping.   Pt reports that he has been has low energy and experiencing some cramping. Pt stated he feels nauseous after eating. Pt stated he is at work currently but requests that a nurse return his call. Cb# [phone]     Called pt - [id number]

## 2022-07-16 NOTE — Telephone Encounter (Signed)
   Chief Complaint: Abdominal/ and sometimes chest pain. Intermittent Symptoms: above Frequency: PT states a couple of weeks - but called for same in august. Pertinent Negatives: Patient denies SOB Disposition: [x] ED /[] Urgent Care (no appt availability in office) / [] Appointment(In office/virtual)/ []  [place] [facility]/ [] Home Care/ [] Refused Recommended Disposition /[]  Mobile Bus/ []  Follow-up with PCP Additional Notes: Pt has had this intermittent pain since August. But also states that it has been going on for a "couple of weeks". PT never followed up on this in August.    Per pt this pain makes him stop and wait or sit down until it passes. The pain brought him to his knees at work. Co-workers wanted to call EMS but pt refused. Pt will get a ride to ED or call EMS.     Summary: Pt has low energy and experiencing some cramping.    Pt reports that he has been has low energy and experiencing some cramping. Pt stated he feels nauseous after eating. Pt stated he is at work currently but requests that a nurse return his call. Cb# [phone]      Reason for Disposition . [1] SEVERE pain AND [2] age > 60 years  Answer Assessment - Initial Assessment Questions 1. LOCATION: "Where does it hurt?"      Abdomen and Left side above the belly button. 2. RADIATION: "Does the pain shoot anywhere else?" (e.g., chest, back)     no 3. ONSET: "When did the pain begin?" (Minutes, hours or days ago)      A couple of weeks 4. SUDDEN: "Gradual or sudden onset?"     Sudden 5. PATTERN "Does the pain come and go, or is it constant?"    - If it comes and goes: "How long does it last?" "Do you have pain now?"     (Note: Comes and goes means the pain is intermittent. It goes away completely between bouts.)    - If constant: "Is it getting better, staying the same, or getting worse?"      (Note: Constant means the pain never goes away completely; most serious pain is constant and gets  worse.)      Comes and goes - lasts a few minutes 6. SEVERITY: "How bad is the pain?"  (e.g., Scale 1-10; mild, moderate, or severe)    - MILD (1-3): Doesn't interfere with normal activities, abdomen soft and not tender to touch.     - MODERATE (4-7): Interferes with normal activities or awakens from sleep, abdomen tender to touch.     - SEVERE (8-10): Excruciating pain, doubled over, unable to do any normal activities.       8/10 7. RECURRENT SYMPTOM: "Have you ever had this type of stomach pain before?" If Yes, ask: "When was the last time?" and "What happened that time?"      no 8. CAUSE: "What do you think is causing the stomach pain?"     Unsure - not eating enough 9. RELIEVING/AGGRAVATING FACTORS: "What makes it better or worse?" (e.g., antacids, bending or twisting motion, bowel movement)     Stand and wait or sit. 10. OTHER SYMPTOMS: "Do you have any other symptoms?" (e.g., back pain, diarrhea, fever, urination pain, vomiting)       Low energy.  Protocols used: Abdominal Pain - Male-A-AH

## 2022-07-17 ENCOUNTER — Encounter (HOSPITAL_COMMUNITY): Payer: Self-pay

## 2022-07-17 ENCOUNTER — Emergency Department (HOSPITAL_COMMUNITY)
Admission: EM | Admit: 2022-07-17 | Discharge: 2022-07-17 | Disposition: A | Discharge status: Home / Self Care | Payer: Commercial Managed Care - HMO | Attending: Emergency Medicine | Admitting: Emergency Medicine

## 2022-07-17 ENCOUNTER — Other Ambulatory Visit: Payer: Self-pay

## 2022-07-17 DIAGNOSIS — E119 Type 2 diabetes mellitus without complications: Secondary | ICD-10-CM | POA: Insufficient documentation

## 2022-07-17 DIAGNOSIS — Z7984 Long term (current) use of oral hypoglycemic drugs: Secondary | ICD-10-CM | POA: Diagnosis not present

## 2022-07-17 DIAGNOSIS — R109 Unspecified abdominal pain: Secondary | ICD-10-CM | POA: Insufficient documentation

## 2022-07-17 LAB — COMPREHENSIVE METABOLIC PANEL
ALT: 19 U/L (ref 0–44)
AST: 25 U/L (ref 15–41)
Albumin: 4.1 g/dL (ref 3.5–5.0)
Alkaline Phosphatase: 62 U/L (ref 38–126)
Anion gap: 8 (ref 5–15)
BUN: 20 mg/dL (ref 8–23)
CO2: 23 mmol/L (ref 22–32)
Calcium: 9 mg/dL (ref 8.9–10.3)
Chloride: 107 mmol/L (ref 98–111)
Creatinine, Ser: 1.12 mg/dL (ref 0.61–1.24)
GFR, Estimated: >60 mL/min (ref >60)
Glucose, Bld: 90 mg/dL (ref 70–99)
Potassium: 4.5 mmol/L (ref 3.5–5.1)
Sodium: 138 mmol/L (ref 135–145)
Total Bilirubin: 0.9 mg/dL (ref 0.3–1.2)
Total Protein: 7.8 g/dL (ref 6.5–8.1)

## 2022-07-17 LAB — CBC
HCT: 49 % (ref 39.0–52.0)
Hemoglobin: 14.7 g/dL (ref 13.0–17.0)
MCH: 23.6 pg — ABNORMAL LOW (ref 26.0–34.0)
MCHC: 30 g/dL (ref 30.0–36.0)
MCV: 78.7 fL — ABNORMAL LOW (ref 80.0–100.0)
Platelets: 342 10*3/uL (ref 150–400)
RBC: 6.23 MIL/uL — ABNORMAL HIGH (ref 4.22–5.81)
RDW: 18.9 % — ABNORMAL HIGH (ref 11.5–15.5)
WBC: 11.2 10*3/uL — ABNORMAL HIGH (ref 4.0–10.5)
nRBC: 0 % (ref 0.0–0.2)

## 2022-07-17 LAB — URINALYSIS, ROUTINE W REFLEX MICROSCOPIC
Bacteria, UA: NONE SEEN
Bilirubin Urine: NEGATIVE
Glucose, UA: >=500 mg/dL — AB
Hgb urine dipstick: NEGATIVE
Ketones, ur: 20 mg/dL — AB
Leukocytes,Ua: NEGATIVE
Nitrite: NEGATIVE
Protein, ur: NEGATIVE mg/dL
Specific Gravity, Urine: 1.028 (ref 1.005–1.030)
pH: 5 (ref 5.0–8.0)

## 2022-07-17 LAB — LIPASE, BLOOD: Lipase: 32 U/L (ref 11–51)

## 2022-07-17 NOTE — ED Notes (Signed)
Pt care taken, no complaints at this time, said he feels better.

## 2022-07-17 NOTE — ED Provider Triage Note (Signed)
Emergency Medicine Provider Triage Evaluation Note  Jonathan Gonzalez , a 61 y.o. male  was evaluated in triage.  Pt complains of left lower quadrant abdominal cramping on and off for a month, not associated to eating, patient is wondering whether it may have something to do with his overall eating patterns, patient reports that he only eats about 1 meal a day at night, does not usually have an appetite to eat more, he denies any unintentional weight loss however.  He reports that he started taking his metformin only once a day after a nurse told him that he may not be getting enough nutrients to be taking it twice a day.  He denies any nausea, vomiting, diarrhea.  He denies any significant abdominal pain at time of current evaluation, patient reports when he does have his abdominal cramping is around an 8/10.  He denies any recent intra-abdominal surgery.  Review of Systems  Positive: Abdominal cramping Negative: Nausea, vomiting, diarrhea, fever, chills  Physical Exam  BP 129/80 (BP Location: Left Arm)   Pulse 89   Temp 98.3 F (36.8 C) (Oral)   Resp 17   SpO2 97%  Gen:   Awake, no distress   Resp:  Normal effort  MSK:   Moves extremities without difficulty  Other:  No ttp of abdomen  Medical Decision Making  Medically screening exam initiated at 8:30 AM.  Appropriate orders placed.  BRESLIN HEMANN was informed that the remainder of the evaluation will be completed by another provider, this initial triage assessment does not replace that evaluation, and the importance of remaining in the ED until their evaluation is complete.  Workup initiated   Anselmo Pickler, [place] 07/17/22 0814

## 2022-07-17 NOTE — ED Triage Notes (Signed)
Patient has had abdominal cramping on and off for a few weeks along with low energy. No vomiting or diarrhea.

## 2022-07-17 NOTE — ED Provider Notes (Signed)
[facility] DEPT Provider Note   CSN: [id number] Arrival date & time: 07/17/22  0750     History  Chief Complaint  Patient presents with   Abdominal Cramping    Jonathan Gonzalez is a 61 y.o. male with a past medical history of type 2 diabetes presenting today due to abdominal discomfort.  Says that he first noticed it a few months ago and that it feels like cramping.  Is worse when he bends over and lifts things as he does at his job frequently.  Also reports that he drinks a lot of water but only eats 1 meal a day at night.  Blood sugars have been in the 100s at home and last A1c was 5.  He says he is less concerned about his abdominal discomfort and more so is concerned because he has decreased energy at varying points of the day.   Abdominal Cramping       Home Medications Prior to Admission medications   Medication Sig Start Date End Date Taking? Authorizing Provider  Aspirin-Salicylamide-Caffeine (BC HEADACHE POWDER PO) Take 1 Package by mouth as needed (headache/pain).    [provider]  atorvastatin (LIPITOR) 10 MG tablet Take 1 tablet (10 mg total) by mouth daily. For Cholesterol and diabetes 03/20/22   Gildardo Pounds, NP  Blood Glucose Monitoring Suppl (TRUE METRIX METER) w/Device KIT Use as instructed. Check blood glucose level by fingerstick twice per day. E11.65 11/25/20   Gildardo Pounds, NP  dapagliflozin propanediol (FARXIGA) 5 MG TABS tablet Take 1 tablet (5 mg total) by mouth daily before breakfast. 03/12/22   Gildardo Pounds, NP  glucose blood (TRUE METRIX BLOOD GLUCOSE TEST) test strip Use as instructed. Check blood glucose level by fingerstick twice per day. NEEDS PASS 11/25/20   Gildardo Pounds, NP  hydrOXYzine (VISTARIL) 25 MG capsule Take 1-2 capsules (25-50 mg total) by mouth at bedtime. For sleep 03/12/22   Gildardo Pounds, NP  lisinopril (ZESTRIL) 20 MG tablet TAKE 1 TABLET BY MOUTH ONCE A DAY 03/12/22   Gildardo Pounds, NP  metFORMIN (GLUCOPHAGE) 500 MG tablet Take 1 tablet (500 mg total) by mouth 2 (two) times daily with a meal. 03/12/22 07/20/22  Gildardo Pounds, NP  omeprazole (PRILOSEC) 20 MG capsule Take 1 capsule (20 mg total) by mouth daily. FOR ACID REFLUX 03/12/22   Gildardo Pounds, NP      Allergies    Patient has no known allergies.    Review of Systems   Review of Systems  Physical Exam Updated Vital Signs BP (!) 126/90 (BP Location: Left Arm)   Pulse (!) 101   Temp 98.6 F (37 C) (Oral)   Resp 18   SpO2 100%  Physical Exam Vitals and nursing note reviewed.  Constitutional:      General: He is not in acute distress.    Appearance: Normal appearance. He is not ill-appearing.  HENT:     Head: Normocephalic and atraumatic.  Eyes:     General: No scleral icterus.    Conjunctiva/sclera: Conjunctivae normal.  Pulmonary:     Effort: Pulmonary effort is normal. No respiratory distress.  Abdominal:     General: There is distension.     Palpations: Abdomen is soft.     Tenderness: There is no abdominal tenderness.     Comments: Distended but nontender.  Patient reports this is baseline  Skin:    General: Skin is warm and dry.  Findings: No rash.  Neurological:     Mental Status: He is alert.  Psychiatric:        Mood and Affect: Mood normal.    ED Results / Procedures / Treatments   Labs (all labs ordered are listed, but only abnormal results are displayed) Labs Reviewed  CBC - Abnormal; Notable for the following components:      Result Value   WBC 11.2 (*)    RBC 6.23 (*)    MCV 78.7 (*)    MCH 23.6 (*)    RDW 18.9 (*)    All other components within normal limits  LIPASE, BLOOD  COMPREHENSIVE METABOLIC PANEL  URINALYSIS, ROUTINE W REFLEX MICROSCOPIC    EKG None  Radiology No results found.  Procedures Procedures   Medications Ordered in ED Medications - No data to display  ED Course/ Medical Decision Making/ A&P                           Medical  Decision Making Amount and/or Complexity of Data Reviewed Labs: ordered.   This is a 61 year old male who presents to the ED for concern of abdominal cramping and low energy.  Differential includes but is not limited to electrolyte abnormality, constipation, colitis, pancreatitis, gastritis, IBS/IBD, iron deficiency, thyroid abnormality, substance use.   This is not an exhaustive differential.    Past Medical History / Co-morbidities / Social History: Diabetes, well controlled, hypertension   Additional history: Per chart review patient has called about his abdominal cramping in August and again yesterday.  He was recommended to come to the emergency department.   Physical Exam: Pertinent physical exam findings include Distended abdomen however patient reports its baseline.  Soft and nontender Well-appearing  Lab Tests: I ordered, and personally interpreted labs.  The pertinent results include: No concerning findings   Imaging Studies: Considered ultrasound and CT scan however after shared decision making patient agrees that this may not be enough.  At this time.     Medications: Asymptomatic currently and denies the need for medication  MDM/Disposition: This is a 61 year old male who presented today with abdominal cramping for the past 1 to 2 months as well as decrease in energy.  Differential was broad.  Patient is well-appearing on physical exam.  Lab work with mild leukocytosis however afebrile and not tachycardic, low suspicion infection.  His abdomen is nontender.  His history is more consistent with a muscular discomfort however we did discuss the option for CT imaging.  After shared decision-making patient believes he can see his PCP and they can send him for CT scan if they believe it is necessary.  He tells me that he has recently started drinking Celsius energy drinks every day.  Notes that the majority of his fatigue comes later in the day which I suspect is secondary to  caffeine withdrawal.  Hemoglobin was normal but his MCV is mildly low, iron deficiency may also be an etiology of his fatigue.  At this time he does not appear to have emergent condition requiring intervention at this time.  After shared decision making we both agree that he is stable for discharge home and PCP follow-up.  We discussed the importance of eating multiple meals a day to maintain his energy.  He sees his PCP in December but will call them tomorrow for a sooner appointment.  Final Clinical Impression(s) / ED Diagnoses Final diagnoses:  Abdominal cramping    Rx /  DC Orders ED Discharge Orders     None      Results and diagnoses were explained to the patient. Return precautions discussed in full. Patient had no additional questions and expressed complete understanding.   This chart was dictated using voice recognition software.  Despite best efforts to proofread,  errors can occur which can change the documentation meaning.    Rhae Hammock, PA-C 07/17/22 [place], [place], [place] 07/18/22 [phone]

## 2022-07-17 NOTE — Discharge Instructions (Addendum)
You came in today with fatigue and some abdominal cramping that had been going on for 1 to 2 months.  Your lab work today looks good.  Please follow-up with your primary care provider.  You should discuss your habit of only eating at nighttime.  Avoid Celsius energy drinks for the time being.  Return with any worsening symptoms, otherwise call your PCP in the morning.  It was a pleasure to meet you and we hope you feel better!

## 2022-07-18 NOTE — Telephone Encounter (Signed)
Informed by patient that a visit to the ED was made.

## 2022-07-25 ENCOUNTER — Other Ambulatory Visit: Payer: Self-pay

## 2022-08-21 ENCOUNTER — Other Ambulatory Visit: Payer: Self-pay

## 2022-08-21 ENCOUNTER — Other Ambulatory Visit (HOSPITAL_COMMUNITY): Payer: Self-pay

## 2022-08-24 ENCOUNTER — Other Ambulatory Visit: Payer: Self-pay

## 2022-09-14 ENCOUNTER — Emergency Department (HOSPITAL_COMMUNITY): Payer: Commercial Managed Care - HMO

## 2022-09-14 ENCOUNTER — Other Ambulatory Visit: Payer: Self-pay

## 2022-09-14 ENCOUNTER — Encounter: Payer: Self-pay | Admitting: Nurse Practitioner

## 2022-09-14 ENCOUNTER — Inpatient Hospital Stay (HOSPITAL_COMMUNITY)
Admission: EM | Admit: 2022-09-14 | Discharge: 2022-09-19 | DRG: 308 | Disposition: A | Discharge status: Home / Self Care | Payer: Commercial Managed Care - HMO | Source: Ambulatory Visit | Attending: Family Medicine | Admitting: Family Medicine

## 2022-09-14 ENCOUNTER — Ambulatory Visit: Payer: Commercial Managed Care - HMO | Attending: Nurse Practitioner | Admitting: Nurse Practitioner

## 2022-09-14 VITALS — BP 148/87 | HR 128 | Resp 16 | Ht 68.0 in | Wt 238.4 lb

## 2022-09-14 DIAGNOSIS — J9811 Atelectasis: Secondary | ICD-10-CM | POA: Diagnosis present

## 2022-09-14 DIAGNOSIS — Z79899 Other long term (current) drug therapy: Secondary | ICD-10-CM

## 2022-09-14 DIAGNOSIS — I10 Essential (primary) hypertension: Secondary | ICD-10-CM | POA: Diagnosis not present

## 2022-09-14 DIAGNOSIS — I4891 Unspecified atrial fibrillation: Principal | ICD-10-CM | POA: Diagnosis present

## 2022-09-14 DIAGNOSIS — N179 Acute kidney failure, unspecified: Secondary | ICD-10-CM | POA: Diagnosis present

## 2022-09-14 DIAGNOSIS — R000 Tachycardia, unspecified: Secondary | ICD-10-CM | POA: Diagnosis not present

## 2022-09-14 DIAGNOSIS — R0602 Shortness of breath: Secondary | ICD-10-CM

## 2022-09-14 DIAGNOSIS — D6869 Other thrombophilia: Secondary | ICD-10-CM | POA: Diagnosis present

## 2022-09-14 DIAGNOSIS — D509 Iron deficiency anemia, unspecified: Secondary | ICD-10-CM | POA: Diagnosis present

## 2022-09-14 DIAGNOSIS — Z125 Encounter for screening for malignant neoplasm of prostate: Secondary | ICD-10-CM | POA: Diagnosis not present

## 2022-09-14 DIAGNOSIS — E785 Hyperlipidemia, unspecified: Secondary | ICD-10-CM | POA: Insufficient documentation

## 2022-09-14 DIAGNOSIS — I480 Paroxysmal atrial fibrillation: Secondary | ICD-10-CM | POA: Diagnosis not present

## 2022-09-14 DIAGNOSIS — Z1152 Encounter for screening for COVID-19: Secondary | ICD-10-CM

## 2022-09-14 DIAGNOSIS — Z79890 Hormone replacement therapy (postmenopausal): Secondary | ICD-10-CM

## 2022-09-14 DIAGNOSIS — E1165 Type 2 diabetes mellitus with hyperglycemia: Secondary | ICD-10-CM

## 2022-09-14 DIAGNOSIS — I5021 Acute systolic (congestive) heart failure: Secondary | ICD-10-CM | POA: Diagnosis present

## 2022-09-14 DIAGNOSIS — Z7982 Long term (current) use of aspirin: Secondary | ICD-10-CM

## 2022-09-14 DIAGNOSIS — I110 Hypertensive heart disease with heart failure: Secondary | ICD-10-CM | POA: Diagnosis present

## 2022-09-14 DIAGNOSIS — K219 Gastro-esophageal reflux disease without esophagitis: Secondary | ICD-10-CM | POA: Diagnosis present

## 2022-09-14 DIAGNOSIS — E119 Type 2 diabetes mellitus without complications: Secondary | ICD-10-CM | POA: Diagnosis not present

## 2022-09-14 LAB — BASIC METABOLIC PANEL
Anion gap: 9 (ref 5–15)
BUN: 10 mg/dL (ref 8–23)
CO2: 23 mmol/L (ref 22–32)
Calcium: 8.9 mg/dL (ref 8.9–10.3)
Chloride: 108 mmol/L (ref 98–111)
Creatinine, Ser: 1.14 mg/dL (ref 0.61–1.24)
GFR, Estimated: >60 mL/min (ref >60)
Glucose, Bld: 97 mg/dL (ref 70–99)
Potassium: 4.6 mmol/L (ref 3.5–5.1)
Sodium: 140 mmol/L (ref 135–145)

## 2022-09-14 LAB — CBC WITH DIFFERENTIAL/PLATELET
Abs Immature Granulocytes: 0.02 10*3/uL (ref 0.00–0.07)
Basophils Absolute: 0.1 10*3/uL (ref 0.0–0.1)
Basophils Relative: 1 %
Eosinophils Absolute: 0.1 10*3/uL (ref 0.0–0.5)
Eosinophils Relative: 2 %
HCT: 41 % (ref 39.0–52.0)
Hemoglobin: 12.7 g/dL — ABNORMAL LOW (ref 13.0–17.0)
Immature Granulocytes: 0 %
Lymphocytes Relative: 22 %
Lymphs Abs: 1.9 10*3/uL (ref 0.7–4.0)
MCH: 23.6 pg — ABNORMAL LOW (ref 26.0–34.0)
MCHC: 31 g/dL (ref 30.0–36.0)
MCV: 76.4 fL — ABNORMAL LOW (ref 80.0–100.0)
Monocytes Absolute: 0.7 10*3/uL (ref 0.1–1.0)
Monocytes Relative: 7 %
Neutro Abs: 6.1 10*3/uL (ref 1.7–7.7)
Neutrophils Relative %: 68 %
Platelets: 347 10*3/uL (ref 150–400)
RBC: 5.37 MIL/uL (ref 4.22–5.81)
RDW: 17.2 % — ABNORMAL HIGH (ref 11.5–15.5)
WBC: 9 10*3/uL (ref 4.0–10.5)
nRBC: 0 % (ref 0.0–0.2)

## 2022-09-14 LAB — RESP PANEL BY RT-PCR (RSV, FLU A&B, COVID)  RVPGX2
Influenza A by PCR: NEGATIVE
Influenza B by PCR: NEGATIVE
Resp Syncytial Virus by PCR: NEGATIVE
SARS Coronavirus 2 by RT PCR: NEGATIVE

## 2022-09-14 LAB — POCT GLYCOSYLATED HEMOGLOBIN (HGB A1C): HbA1c, POC (controlled diabetic range): 6.6 % (ref 0.0–7.0)

## 2022-09-14 LAB — GLUCOSE, CAPILLARY: Glucose-Capillary: 111 mg/dL — ABNORMAL HIGH (ref 70–99)

## 2022-09-14 LAB — MAGNESIUM: Magnesium: 2.3 mg/dL (ref 1.7–2.4)

## 2022-09-14 LAB — BRAIN NATRIURETIC PEPTIDE: B Natriuretic Peptide: 363.3 pg/mL — ABNORMAL HIGH (ref 0.0–100.0)

## 2022-09-14 MED ORDER — ACETAMINOPHEN 325 MG PO TABS
650.0000 mg | ORAL_TABLET | ORAL | Status: DC | PRN
  Administered 2022-09-15: 650 mg via ORAL
  Filled 2022-09-14: qty 2

## 2022-09-14 MED ORDER — METOPROLOL TARTRATE 5 MG/5ML IV SOLN
5.0000 mg | Freq: Once | INTRAVENOUS | Status: AC
  Administered 2022-09-14: 5 mg via INTRAVENOUS
  Filled 2022-09-14: qty 5

## 2022-09-14 MED ORDER — DILTIAZEM HCL-DEXTROSE 125-5 MG/125ML-% IV SOLN (PREMIX)
5.0000 mg/h | INTRAVENOUS | Status: DC
  Administered 2022-09-14 – 2022-09-15 (×2): 5 mg/h via INTRAVENOUS
  Filled 2022-09-14 (×2): qty 125

## 2022-09-14 MED ORDER — DILTIAZEM LOAD VIA INFUSION
10.0000 mg | Freq: Once | INTRAVENOUS | Status: AC
  Administered 2022-09-14: 10 mg via INTRAVENOUS
  Filled 2022-09-14: qty 10

## 2022-09-14 MED ORDER — METFORMIN HCL 500 MG PO TABS
500.0000 mg | ORAL_TABLET | Freq: Two times a day (BID) | ORAL | Status: DC
  Administered 2022-09-15: 500 mg via ORAL
  Filled 2022-09-14: qty 1

## 2022-09-14 MED ORDER — LISINOPRIL 20 MG PO TABS
20.0000 mg | ORAL_TABLET | Freq: Every day | ORAL | Status: DC
  Administered 2022-09-15 – 2022-09-17 (×3): 20 mg via ORAL
  Filled 2022-09-14 (×3): qty 1

## 2022-09-14 MED ORDER — APIXABAN 5 MG PO TABS: 5.0000 mg | ORAL_TABLET | Freq: Two times a day (BID) | ORAL | Status: DC

## 2022-09-14 MED ORDER — DAPAGLIFLOZIN PROPANEDIOL 5 MG PO TABS
5.0000 mg | ORAL_TABLET | Freq: Every day | ORAL | Status: DC
  Administered 2022-09-15 – 2022-09-18 (×4): 5 mg via ORAL
  Filled 2022-09-14 (×5): qty 1

## 2022-09-14 MED ORDER — ATORVASTATIN CALCIUM 10 MG PO TABS
10.0000 mg | ORAL_TABLET | Freq: Every day | ORAL | Status: DC
  Administered 2022-09-15 – 2022-09-19 (×5): 10 mg via ORAL
  Filled 2022-09-14 (×5): qty 1

## 2022-09-14 MED ORDER — ONDANSETRON HCL 4 MG/2ML IJ SOLN: 4.0000 mg | Freq: Four times a day (QID) | INTRAMUSCULAR | Status: DC | PRN

## 2022-09-14 MED ORDER — APIXABAN 5 MG PO TABS
5.0000 mg | ORAL_TABLET | Freq: Two times a day (BID) | ORAL | Status: DC
  Administered 2022-09-14 – 2022-09-19 (×10): 5 mg via ORAL
  Filled 2022-09-14 (×10): qty 1

## 2022-09-14 NOTE — ED Notes (Signed)
Called 6E to initiate purple man.

## 2022-09-14 NOTE — ED Triage Notes (Signed)
[facility] reports was told to come to ED for high heart rate, pt noted to be in AFIB RVR 150s in ED. Currently denies chest pain, SHOB, N/V.

## 2022-09-14 NOTE — H&P (Signed)
History and Physical   TERRAN KLINKE SJG:[id number] DOB: 03-05-1961 DOA: 09/14/2022  PCP: Gildardo Pounds, NP   Patient coming from: PCP office  Chief Complaint: New onset A-fib with RVR  HPI: Jonathan Gonzalez is a 61 y.o. male with medical history significant of diabetes, hypertension, hyperlipidemia presenting with tachycardia and new A-fib rhythm.  Patient was at his PCP office today and noted to be tachycardic and EKG was performed which showed new onset A-fib.  Patient reports mild intermittent shortness of breath but otherwise has been asymptomatic and has no symptoms at rest.  He states he is not sure how long it has been going on.  He denies fevers, chills, chest pain, abdominal pain, constipation, diarrhea, nausea, vomiting.  ED Course: Vital signs in the ED significant for heart rate initially in the 130s and then improving to the 100s now.  Blood pressure in the 654Y to 503 systolic.  Lab workup included BMP within normal limits.  CBC with microcytic anemia at 12.7.  BNP 6363.  Respiratory panel for flu COVID and RSV negative.  Chest x-ray with left lower lobe basilar opacity most consistent with atelectasis.  Magnesium level normal.  Patient received diltiazem drip after not improving on IV metoprolol in the ED.  Also started on Eliquis.  Review of Systems: As per HPI otherwise all other systems reviewed and are negative.  Past Medical History:  Diagnosis Date   Diabetes mellitus without complication ([place])    Hyperlipidemia    Hypertension     Past Surgical History:  Procedure Laterality Date   HERNIA REPAIR      Social History  reports that he has never smoked. He has never used smokeless tobacco. He reports that he does not drink alcohol and does not use drugs.  No Known Allergies  No family history on file.   Prior to Admission medications   Medication Sig Start Date End Date Taking? Authorizing Provider  Aspirin-Salicylamide-Caffeine (BC HEADACHE POWDER  PO) Take 1 Package by mouth as needed (headache/pain).    [provider]  atorvastatin (LIPITOR) 10 MG tablet Take 1 tablet (10 mg total) by mouth daily. For Cholesterol and diabetes 03/20/22   Gildardo Pounds, NP  Blood Glucose Monitoring Suppl (TRUE METRIX METER) w/Device KIT Use as instructed. Check blood glucose level by fingerstick twice per day. E11.65 Patient not taking: Reported on 09/14/2022 11/25/20   Gildardo Pounds, NP  dapagliflozin propanediol (FARXIGA) 5 MG TABS tablet Take 1 tablet (5 mg total) by mouth daily before breakfast. 03/12/22   Gildardo Pounds, NP  glucose blood (TRUE METRIX BLOOD GLUCOSE TEST) test strip Use as instructed. Check blood glucose level by fingerstick twice per day. NEEDS PASS Patient not taking: Reported on 09/14/2022 11/25/20   Gildardo Pounds, NP  hydrOXYzine (VISTARIL) 25 MG capsule Take 1-2 capsules (25-50 mg total) by mouth at bedtime. For sleep 03/12/22   Gildardo Pounds, NP  lisinopril (ZESTRIL) 20 MG tablet TAKE 1 TABLET BY MOUTH ONCE A DAY 03/12/22   Gildardo Pounds, NP  metFORMIN (GLUCOPHAGE) 500 MG tablet Take 1 tablet (500 mg total) by mouth 2 (two) times daily with a meal. 03/12/22 09/14/22  Gildardo Pounds, NP  omeprazole (PRILOSEC) 20 MG capsule Take 1 capsule (20 mg total) by mouth daily. FOR ACID REFLUX 03/12/22   Gildardo Pounds, NP    Physical Exam: Vitals:   09/14/22 1700 09/14/22 1730 09/14/22 1746 09/14/22 1815  BP: (!) 119/93 Marland Kitchen)  121/98 (!) 134/95 122/88  Pulse: (!) 130 (!) 127 100 81  Resp: _0 Temp:   97.6 F (36.4 C)   TempSrc:   Oral   SpO2: 92% 95% 99% 99%  Weight:      Height:        Physical Exam Constitutional:      General: He is not in acute distress.    Appearance: Normal appearance.  HENT:     Head: Normocephalic and atraumatic.     Mouth/Throat:     Mouth: Mucous membranes are moist.     Pharynx: Oropharynx is clear.  Eyes:     Extraocular Movements: Extraocular movements intact.      Pupils: Pupils are equal, round, and reactive to light.  Cardiovascular:     Rate and Rhythm: Tachycardia present. Rhythm irregular.     Pulses: Normal pulses.     Heart sounds: Normal heart sounds.  Pulmonary:     Effort: Pulmonary effort is normal. No respiratory distress.     Breath sounds: Normal breath sounds.  Abdominal:     General: Bowel sounds are normal. There is no distension.     Palpations: Abdomen is soft.     Tenderness: There is no abdominal tenderness.  Musculoskeletal:        General: No swelling or deformity.  Skin:    General: Skin is warm and dry.  Neurological:     General: No focal deficit present.     Mental Status: Mental status is at baseline.    Labs on Admission: I have personally reviewed following labs and imaging studies  CBC: Recent Labs  Lab 09/14/22 1517  WBC 9.0  NEUTROABS 6.1  HGB 12.7*  HCT 41.0  MCV 76.4*  PLT 728    Basic Metabolic Panel: Recent Labs  Lab 09/14/22 1517  NA 140  K 4.6  CL 108  CO2 23  GLUCOSE 97  BUN 10  CREATININE 1.14  CALCIUM 8.9  MG 2.3    GFR: Estimated Creatinine Clearance: 80.2 mL/min (by C-G formula based on SCr of 1.14 mg/dL).  Liver Function Tests: No results for input(s): "AST", "ALT", "ALKPHOS", "BILITOT", "PROT", "ALBUMIN" in the last 168 hours.  Urine analysis:    Component Value Date/Time   COLORURINE YELLOW 07/17/2022 [place] 07/17/2022 1745   LABSPEC 1.028 07/17/2022 1745   PHURINE 5.0 07/17/2022 1745   GLUCOSEU >=500 (A) 07/17/2022 1745   HGBUR NEGATIVE 07/17/2022 1745   BILIRUBINUR NEGATIVE 07/17/2022 1745   KETONESUR 20 (A) 07/17/2022 1745   PROTEINUR NEGATIVE 07/17/2022 1745   NITRITE NEGATIVE 07/17/2022 1745   LEUKOCYTESUR NEGATIVE 07/17/2022 1745    Radiological Exams on Admission: DG Chest Port 1 View  Result Date: 09/14/2022 CLINICAL DATA:  Shortness of breath EXAM: PORTABLE CHEST 1 VIEW COMPARISON:  Chest radiograph dated 10/16/2007 FINDINGS:  Low lung volumes. Bibasilar patchy opacities no pleural effusion or pneumothorax. The heart size and mediastinal contours are within normal limits. The visualized skeletal structures are unremarkable. IMPRESSION: Low lung volumes with bibasilar patchy opacities, likely atelectasis. Electronically Signed   By: Darrin Nipper M.D.   On: 09/14/2022 16:03    EKG: Independently reviewed.  Atrial fibrillation with RVR at 150 bpm.  Assessment/Plan Principal Problem:   Atrial fibrillation with RVR (HCC) Active Problems:   DM (diabetes mellitus) (HCC)   HTN (hypertension)   HLD (hyperlipidemia)   Atrial fibrillation with RVR > New onset A-fib with RVR.  Unsure how  long it has been going because he only has minimal symptoms of intermittent shortness of breath.  Noted to be tachycardic with new onset A-fib at PCP visit today. > Did not respond to initial IV metoprolol and has started to slowly respond on IV diltiazem drip. - Monitor in progressive unit - Continue with diltiazem drip - Continue with Eliquis - Check echocardiogram - A-fib clinic referral - A.m. lipid panel  Hypertension - Continue home lisinopril  Hyperlipidemia - Continue home atorvastatin  Diabetes - Continue home Farxiga and metformin  ?Bedbugs > Patient placed on contact precautions in the ED due to staff for porting they had noticed bedbugs on the patient. - Continue precautions  DVT prophylaxis: Eliquis Code Status:   Full Family Communication:  None on admission  Disposition Plan:   Patient is from:  Home  Anticipated DC to:  Home  Anticipated DC date:  1 to 2 days  Anticipated DC barriers: None  Consults called:  None Admission status:  Observation, progressive  Severity of Illness: The appropriate patient status for this patient is OBSERVATION. Observation status is judged to be reasonable and necessary in order to provide the required intensity of service to ensure the patient's safety. The patient's presenting  symptoms, physical exam findings, and initial radiographic and laboratory data in the context of their medical condition is felt to place them at decreased risk for further clinical deterioration. Furthermore, it is anticipated that the patient will be medically stable for discharge from the hospital within 2 midnights of admission.    Marcelyn Bruins MD [facility]  How to contact the [facility] Attending or Consulting provider [place] or covering provider during after hours [place], for this patient?   Check the care team in [facility] and look for a) attending/consulting [facility] provider listed and b) the [facility] team listed Log into [email or web address] and use [place]'s universal password to access. If you do not have the password, please contact the hospital operator. Locate the [facility] provider you are looking for under [facility] and page to a number that you can be directly reached. If you still have difficulty reaching the provider, please page the [facility] (Director on Call) for the Hospitalists listed on amion for assistance.  09/14/2022, 6:21 PM

## 2022-09-14 NOTE — Progress Notes (Signed)
Hyperventilating

## 2022-09-14 NOTE — ED Provider Notes (Signed)
[facility] EMERGENCY DEPARTMENT Provider Note   CSN: [id number] Arrival date & time: 09/14/22  1434     History  CC: Tachycardia   Jonathan Gonzalez is a 61 y.o. male w/ hx of HTN, diabetes, HLD, presenting to ED with new onset A Fib.  Patient was referred into the emergency department [facility] as he was found to be in a new tachycardia and new atrial fibrillation on his EKG.  The patient reports that he was unaware that he was in A-fib and has no prior history of this.  He says that he has noticed that he feels "maybe a little more short of breath when I am walking".  At rest he is asymptomatic.  He is not certain how long this been going on.  He denies leg swelling.  He denies chest pain or pressure.  Denies history of MI or cardiac intervention or stent.  He is not on anticoagulation.  HPI     Home Medications Prior to Admission medications   Medication Sig Start Date End Date Taking? Authorizing Provider  Aspirin-Salicylamide-Caffeine (BC HEADACHE POWDER PO) Take 1 Package by mouth as needed (headache/pain).    [provider]  atorvastatin (LIPITOR) 10 MG tablet Take 1 tablet (10 mg total) by mouth daily. For Cholesterol and diabetes 03/20/22   Gildardo Pounds, NP  Blood Glucose Monitoring Suppl (TRUE METRIX METER) w/Device KIT Use as instructed. Check blood glucose level by fingerstick twice per day. E11.65 Patient not taking: Reported on 09/14/2022 11/25/20   Gildardo Pounds, NP  dapagliflozin propanediol (FARXIGA) 5 MG TABS tablet Take 1 tablet (5 mg total) by mouth daily before breakfast. 03/12/22   Gildardo Pounds, NP  glucose blood (TRUE METRIX BLOOD GLUCOSE TEST) test strip Use as instructed. Check blood glucose level by fingerstick twice per day. NEEDS PASS Patient not taking: Reported on 09/14/2022 11/25/20   Gildardo Pounds, NP  hydrOXYzine (VISTARIL) 25 MG capsule Take 1-2 capsules (25-50 mg total) by mouth at  bedtime. For sleep 03/12/22   Gildardo Pounds, NP  lisinopril (ZESTRIL) 20 MG tablet TAKE 1 TABLET BY MOUTH ONCE A DAY 03/12/22   Gildardo Pounds, NP  metFORMIN (GLUCOPHAGE) 500 MG tablet Take 1 tablet (500 mg total) by mouth 2 (two) times daily with a meal. 03/12/22 09/14/22  Gildardo Pounds, NP  omeprazole (PRILOSEC) 20 MG capsule Take 1 capsule (20 mg total) by mouth daily. FOR ACID REFLUX 03/12/22   Gildardo Pounds, NP      Allergies    Patient has no known allergies.    Review of Systems   Review of Systems  Physical Exam Updated Vital Signs BP (!) 134/95 (BP Location: Right Arm)   Pulse 100   Temp 97.6 F (36.4 C) (Oral)   Resp 18   Ht _0  (1.727 m)   Wt 108.4 kg   SpO2 99%   BMI 36.34 kg/m  Physical Exam Constitutional:      General: He is not in acute distress. HENT:     Head: Normocephalic and atraumatic.  Eyes:     Conjunctiva/sclera: Conjunctivae normal.     Pupils: Pupils are equal, round, and reactive to light.  Cardiovascular:     Rate and Rhythm: Tachycardia present. Rhythm irregular.  Pulmonary:     Effort: Pulmonary effort is normal. No respiratory distress.  Abdominal:     General: There is no distension.     Tenderness:  There is no abdominal tenderness.  Skin:    General: Skin is warm and dry.  Neurological:     General: No focal deficit present.     Mental Status: He is alert. Mental status is at baseline.  Psychiatric:        Mood and Affect: Mood normal.        Behavior: Behavior normal.     ED Results / Procedures / Treatments   Labs (all labs ordered are listed, but only abnormal results are displayed) Labs Reviewed  CBC WITH DIFFERENTIAL/PLATELET - Abnormal; Notable for the following components:      Result Value   Hemoglobin 12.7 (*)    MCV 76.4 (*)    MCH 23.6 (*)    RDW 17.2 (*)    All other components within normal limits  BRAIN NATRIURETIC PEPTIDE - Abnormal; Notable for the following components:   B Natriuretic Peptide  363.3 (*)    All other components within normal limits  RESP PANEL BY RT-PCR (RSV, FLU A&B, COVID)  RVPGX2  BASIC METABOLIC PANEL  MAGNESIUM    EKG EKG Interpretation  Date/Time:  Friday September 14 2022 14:44:40 EST Ventricular Rate:  150 PR Interval:    QRS Duration: 70 QT Interval:  284 QTC Calculation: 448 R Axis:   52 Text Interpretation: Atrial fibrillation with rapid ventricular response Abnormal ECG When compared with ECG of 14-Sep-2022 11:41, PREVIOUS ECG IS PRESENT Confirmed by Octaviano Glow [phone]) on 09/14/2022 2:54:07 PM  Radiology DG Chest Port 1 View  Result Date: 09/14/2022 CLINICAL DATA:  Shortness of breath EXAM: PORTABLE CHEST 1 VIEW COMPARISON:  Chest radiograph dated 10/16/2007 FINDINGS: Low lung volumes. Bibasilar patchy opacities no pleural effusion or pneumothorax. The heart size and mediastinal contours are within normal limits. The visualized skeletal structures are unremarkable. IMPRESSION: Low lung volumes with bibasilar patchy opacities, likely atelectasis. Electronically Signed   By: Darrin Nipper M.D.   On: 09/14/2022 16:03    Procedures Procedures    Medications Ordered in ED Medications  diltiazem (CARDIZEM) 1 mg/mL load via infusion 10 mg (10 mg Intravenous Bolus from Bag 09/14/22 1736)    And  diltiazem (CARDIZEM) 125 mg in dextrose 5% 125 mL (1 mg/mL) infusion (5 mg/hr Intravenous New Bag/Given 09/14/22 1734)  apixaban (ELIQUIS) tablet 5 mg (5 mg Oral Given 09/14/22 1743)  metoprolol tartrate (LOPRESSOR) injection 5 mg (5 mg Intravenous Given 09/14/22 1532)    ED Course/ Medical Decision Making/ A&P Clinical Course as of 09/14/22 1802  Fri Sep 14, 2022  1731 Patient remains tachycardic despite metoprolol.  At this point we will start him on diltiazem bolus and infusion and admit him to the hospital for A-fib with RVR.  He is not volume overloaded not requiring diuresis at this point.  He is breathing comfortably in the room.  Eliquis was  ordered to initiate anticoagulation [MT]    Clinical Course User Index [MT] Siria Calandro, Carola Rhine, MD                           Medical Decision Making Amount and/or Complexity of Data Reviewed Labs: ordered. Radiology: ordered.  Risk Prescription drug management. Decision regarding hospitalization.   This patient presents to the ED with concern for tachycardia, shortness of breath. This involves an extensive number of treatment options, and is a complaint that carries with it a high risk of complications and morbidity.  The differential diagnosis includes new onset A-fib or  other arrhythmia, versus anemia, versus infection, versus congestive heart failure, versus other  On arrival he is found to be in A-fib with RVR.  Unclear onset of symptoms.  His heart rate is approximate 130 bpm.  He is otherwise asymptomatic.  We will attempt rate control.  If we are successful I would consider referral to A-fib clinic, initiating beta-blockers and anticoagulation.  The patient is wanting to go home if possible.  Given that he has not had an echocardiogram and are not certain of the onset of his symptoms, I would not opt for cardioversion at this time.  (CHADSVASC2 score of 2 for HTN and diabetes; however as he has never had a cardiac workup, and he has known diabetes and high cholesterol, it is likely that he has some underlying vascular disease as well, and may have a score of 3 in fact.)  Co-morbidities that complicate the patient evaluation: Coronary artery disease risk factors as noted in history above  External records from outside source obtained and reviewed including [facility] [place] note today reviewed  I ordered and personally interpreted labs.  The pertinent results include: BNP minor elevation.  Hemoglobin stable at 12.7.  No leukocytosis.  COVID and flu are negative.  Electrolytes are unremarkable  I ordered imaging studies including x-ray of the chest I independently visualized  and interpreted imaging which showed low lung volumes, atelectasis I agree with the radiologist interpretation  The patient was maintained on a cardiac monitor.  I personally viewed and interpreted the cardiac monitored which showed an underlying rhythm of: A-fib with RVR initially.  Per my interpretation the patient's ECG shows A-fib with RVR, no acute ischemic findings  I ordered medication including IV metoprolol for attempted rate control; IV diltiazem infusion and bolus after failed rate control with metoprolol  I have reviewed the patients home medicines and have made adjustments as needed  Test Considered: Low suspicion for pulmonary embolism in this clinical setting  After the interventions noted above, I reevaluated the patient and found that they have: stayed the same  Dispostion:  After consideration of the diagnostic results and the patients response to treatment, I feel that the patent would benefit from medical admission for a fib with rvr.  Please note, staff members noted bedbugs on the patient.  Contact isolation precautions have been ordered.         Final Clinical Impression(s) / ED Diagnoses Final diagnoses:  Atrial fibrillation with RVR [facility])    Rx / DC Orders ED Discharge Orders     None         Donley Harland, Carola Rhine, MD 09/14/22 [phone]

## 2022-09-14 NOTE — Progress Notes (Signed)
Assessment & Plan:  Jonathan Gonzalez was seen today for diabetes and hypertension.  Diagnoses and all orders for this visit:  Type 2 diabetes mellitus with hyperglycemia, without long-term current use of insulin (HCC) -     Cancel: POCT glucose (manual entry) -     POCT glycosylated hemoglobin (Hb A1C) -     CMP14+EGFR  Shortness of breath at rest -     CBC with Differential -     Brain natriuretic peptide -     D-dimer, quantitative -     Thyroid Panel With TSH  Tachycardia -     DG Chest 2 View; Future -     CBC with Differential -     Brain natriuretic peptide -     D-dimer, quantitative -     Thyroid Panel With TSH  Prostate cancer screening -     PSA    Patient has been counseled on age-appropriate routine health concerns for screening and prevention. These are reviewed and up-to-date. Referrals have been placed accordingly. Immunizations are up-to-date or declined.    Subjective:   Chief Complaint  Patient presents with   Diabetes   Hypertension   [name] 61 y.o. male presents to office today for follow up to DM and HTN.  I attempted to contact a triage nurse in the ER and was left on hold several times for up to 10 minutes prior to the phone disconnecting  He currently endorses difficulty breathing over the past several weeks. Has been taking an over the counter supplement for weight loss called Relacore and notes increased stress as he is currently living in a hotel and is sole caregiver for wife who is on hospice at this time. He is visibly tachypneic, HR 130s and EKG is showing Afib. He denies chest pain. He declined EMS services and states he has to go pay his rent and will have someone bring him back to the hospital "later today" I have instructed him that this is not advisable and he needs immediate attention as afib can cause stroke, heart attack or death. I also explained he may be admitted if his rhythm can not be controlled while in the Emergency room.      Review of Systems  Constitutional:  Negative for fever, malaise/fatigue and weight loss.  HENT: Negative.  Negative for nosebleeds.   Eyes: Negative.  Negative for blurred vision, double vision and photophobia.  Respiratory:  Positive for sputum production and shortness of breath. Negative for cough, hemoptysis and wheezing.   Cardiovascular:  Positive for palpitations and orthopnea. Negative for chest pain and leg swelling.  Gastrointestinal: Negative.  Negative for heartburn, nausea and vomiting.  Musculoskeletal: Negative.  Negative for myalgias.  Neurological: Negative.  Negative for dizziness, focal weakness, seizures and headaches.  Psychiatric/Behavioral: Negative.  Negative for suicidal ideas.     Past Medical History:  Diagnosis Date   Diabetes mellitus without complication ([place])    Hyperlipidemia    Hypertension     Past Surgical History:  Procedure Laterality Date   HERNIA REPAIR      History reviewed. No pertinent family history.  Social History Reviewed with no changes to be made today.   Outpatient Medications Prior to Visit  Medication Sig Dispense Refill   Aspirin-Salicylamide-Caffeine (BC HEADACHE POWDER PO) Take 1 Package by mouth as needed (headache/pain).     atorvastatin (LIPITOR) 10 MG tablet Take 1 tablet (10 mg total) by mouth daily. For Cholesterol and diabetes  90 tablet 3   dapagliflozin propanediol (FARXIGA) 5 MG TABS tablet Take 1 tablet (5 mg total) by mouth daily before breakfast. 90 tablet 1   hydrOXYzine (VISTARIL) 25 MG capsule Take 1-2 capsules (25-50 mg total) by mouth at bedtime. For sleep 60 capsule 1   lisinopril (ZESTRIL) 20 MG tablet TAKE 1 TABLET BY MOUTH ONCE A DAY 90 tablet 1   metFORMIN (GLUCOPHAGE) 500 MG tablet Take 1 tablet (500 mg total) by mouth 2 (two) times daily with a meal. 60 tablet 2   omeprazole (PRILOSEC) 20 MG capsule Take 1 capsule (20 mg total) by mouth daily. FOR ACID REFLUX 30 capsule 3   Blood Glucose  Monitoring Suppl (TRUE METRIX METER) w/Device KIT Use as instructed. Check blood glucose level by fingerstick twice per day. E11.65 (Patient not taking: Reported on 09/14/2022) 1 kit 0   glucose blood (TRUE METRIX BLOOD GLUCOSE TEST) test strip Use as instructed. Check blood glucose level by fingerstick twice per day. NEEDS PASS (Patient not taking: Reported on 09/14/2022) 100 each 12   No facility-administered medications prior to visit.    No Known Allergies     Objective:    BP (!) 148/87   Pulse (!) 128   Resp 16   Ht _0  (1.727 m)   Wt 238 lb 6.4 oz (108.1 kg)   SpO2 98%   BMI 36.25 kg/m  Wt Readings from Last 3 Encounters:  09/14/22 238 lb 6.4 oz (108.1 kg)  03/12/22 226 lb 3.2 oz (102.6 kg)  02/27/21 213 lb (96.6 kg)    Physical Exam Vitals and nursing note reviewed.  Constitutional:      Appearance: He is well-developed.  HENT:     Head: Normocephalic and atraumatic.  Cardiovascular:     Rate and Rhythm: Tachycardia present. Rhythm irregular.     Heart sounds: Normal heart sounds. No murmur heard.    No friction rub. No gallop.  Pulmonary:     Effort: Tachypnea present.     Breath sounds: Normal breath sounds. No decreased breath sounds, wheezing, rhonchi or rales.  Chest:     Chest wall: No tenderness.  Abdominal:     General: Bowel sounds are normal.     Palpations: Abdomen is soft.  Musculoskeletal:        General: Normal range of motion.     Cervical back: Normal range of motion.  Skin:    General: Skin is warm and dry.  Neurological:     Mental Status: He is alert and oriented to person, place, and time.     Coordination: Coordination normal.  Psychiatric:        Behavior: Behavior normal. Behavior is cooperative.        Thought Content: Thought content normal.        Judgment: Judgment normal.          Patient has been counseled extensively about nutrition and exercise as well as the importance of adherence with medications and regular  follow-up. The patient was given clear instructions to go to ER or return to medical center if symptoms don't improve, worsen or new problems develop. The patient verbalized understanding.   Follow-up: Return in about 3 months (around 12/14/2022).   Gildardo Pounds, FNP-BC [facility] [place], [place]   09/14/2022, 2:21 PM

## 2022-09-15 ENCOUNTER — Observation Stay (HOSPITAL_COMMUNITY): Payer: Commercial Managed Care - HMO

## 2022-09-15 ENCOUNTER — Encounter (HOSPITAL_COMMUNITY): Payer: Self-pay | Admitting: Internal Medicine

## 2022-09-15 DIAGNOSIS — Z79890 Hormone replacement therapy (postmenopausal): Secondary | ICD-10-CM | POA: Diagnosis not present

## 2022-09-15 DIAGNOSIS — J9811 Atelectasis: Secondary | ICD-10-CM | POA: Diagnosis present

## 2022-09-15 DIAGNOSIS — R000 Tachycardia, unspecified: Secondary | ICD-10-CM | POA: Diagnosis present

## 2022-09-15 DIAGNOSIS — D509 Iron deficiency anemia, unspecified: Secondary | ICD-10-CM | POA: Diagnosis present

## 2022-09-15 DIAGNOSIS — Z79899 Other long term (current) drug therapy: Secondary | ICD-10-CM | POA: Diagnosis not present

## 2022-09-15 DIAGNOSIS — N179 Acute kidney failure, unspecified: Secondary | ICD-10-CM | POA: Diagnosis present

## 2022-09-15 DIAGNOSIS — I5021 Acute systolic (congestive) heart failure: Secondary | ICD-10-CM | POA: Diagnosis present

## 2022-09-15 DIAGNOSIS — I4891 Unspecified atrial fibrillation: Secondary | ICD-10-CM | POA: Diagnosis not present

## 2022-09-15 DIAGNOSIS — I110 Hypertensive heart disease with heart failure: Secondary | ICD-10-CM | POA: Diagnosis present

## 2022-09-15 DIAGNOSIS — E119 Type 2 diabetes mellitus without complications: Secondary | ICD-10-CM | POA: Diagnosis present

## 2022-09-15 DIAGNOSIS — D6869 Other thrombophilia: Secondary | ICD-10-CM | POA: Diagnosis present

## 2022-09-15 DIAGNOSIS — Z1152 Encounter for screening for COVID-19: Secondary | ICD-10-CM | POA: Diagnosis not present

## 2022-09-15 DIAGNOSIS — I480 Paroxysmal atrial fibrillation: Secondary | ICD-10-CM | POA: Diagnosis present

## 2022-09-15 DIAGNOSIS — K219 Gastro-esophageal reflux disease without esophagitis: Secondary | ICD-10-CM | POA: Diagnosis present

## 2022-09-15 DIAGNOSIS — E785 Hyperlipidemia, unspecified: Secondary | ICD-10-CM | POA: Diagnosis present

## 2022-09-15 DIAGNOSIS — Z7982 Long term (current) use of aspirin: Secondary | ICD-10-CM | POA: Diagnosis not present

## 2022-09-15 LAB — LIPID PANEL
Cholesterol: 123 mg/dL (ref 0–200)
HDL: 38 mg/dL — ABNORMAL LOW (ref >40)
LDL Cholesterol: 73 mg/dL (ref 0–99)
Total CHOL/HDL Ratio: 3.2 RATIO
Triglycerides: 59 mg/dL (ref <150)
VLDL: 12 mg/dL (ref 0–40)

## 2022-09-15 LAB — PHOSPHORUS: Phosphorus: 2.9 mg/dL (ref 2.5–4.6)

## 2022-09-15 LAB — CBC WITH DIFFERENTIAL/PLATELET
Abs Immature Granulocytes: 0.02 10*3/uL (ref 0.00–0.07)
Basophils Absolute: 0.1 10*3/uL (ref 0.0–0.1)
Basophils Relative: 1 %
Eosinophils Absolute: 0.2 10*3/uL (ref 0.0–0.5)
Eosinophils Relative: 2 %
HCT: 37.3 % — ABNORMAL LOW (ref 39.0–52.0)
Hemoglobin: 11.7 g/dL — ABNORMAL LOW (ref 13.0–17.0)
Immature Granulocytes: 0 %
Lymphocytes Relative: 24 %
Lymphs Abs: 2.2 10*3/uL (ref 0.7–4.0)
MCH: 23.5 pg — ABNORMAL LOW (ref 26.0–34.0)
MCHC: 31.4 g/dL (ref 30.0–36.0)
MCV: 75.1 fL — ABNORMAL LOW (ref 80.0–100.0)
Monocytes Absolute: 0.7 10*3/uL (ref 0.1–1.0)
Monocytes Relative: 7 %
Neutro Abs: 6.1 10*3/uL (ref 1.7–7.7)
Neutrophils Relative %: 66 %
Platelets: 289 10*3/uL (ref 150–400)
RBC: 4.97 MIL/uL (ref 4.22–5.81)
RDW: 17.1 % — ABNORMAL HIGH (ref 11.5–15.5)
WBC: 9.3 10*3/uL (ref 4.0–10.5)
nRBC: 0 % (ref 0.0–0.2)

## 2022-09-15 LAB — COMPREHENSIVE METABOLIC PANEL
ALT: 18 U/L (ref 0–44)
AST: 21 U/L (ref 15–41)
Albumin: 3.2 g/dL — ABNORMAL LOW (ref 3.5–5.0)
Alkaline Phosphatase: 44 U/L (ref 38–126)
Anion gap: 6 (ref 5–15)
BUN: 10 mg/dL (ref 8–23)
CO2: 22 mmol/L (ref 22–32)
Calcium: 8.7 mg/dL — ABNORMAL LOW (ref 8.9–10.3)
Chloride: 110 mmol/L (ref 98–111)
Creatinine, Ser: 1 mg/dL (ref 0.61–1.24)
GFR, Estimated: >60 mL/min (ref >60)
Glucose, Bld: 88 mg/dL (ref 70–99)
Potassium: 3.9 mmol/L (ref 3.5–5.1)
Sodium: 138 mmol/L (ref 135–145)
Total Bilirubin: 0.7 mg/dL (ref 0.3–1.2)
Total Protein: 6.1 g/dL — ABNORMAL LOW (ref 6.5–8.1)

## 2022-09-15 LAB — ECHOCARDIOGRAM COMPLETE
Height: 68 in
Weight: 3708.8 oz

## 2022-09-15 LAB — HIV ANTIBODY (ROUTINE TESTING W REFLEX): HIV Screen 4th Generation wRfx: NONREACTIVE

## 2022-09-15 LAB — HEMOGLOBIN A1C
Hgb A1c MFr Bld: 6.6 % — ABNORMAL HIGH (ref 4.8–5.6)
Mean Plasma Glucose: 142.72 mg/dL

## 2022-09-15 LAB — MAGNESIUM: Magnesium: 2 mg/dL (ref 1.7–2.4)

## 2022-09-15 MED ORDER — METOPROLOL TARTRATE 25 MG PO TABS
25.0000 mg | ORAL_TABLET | Freq: Two times a day (BID) | ORAL | Status: DC
  Administered 2022-09-15: 25 mg via ORAL
  Filled 2022-09-15: qty 1

## 2022-09-15 MED ORDER — METOPROLOL TARTRATE 12.5 MG HALF TABLET: 12.5000 mg | ORAL_TABLET | Freq: Two times a day (BID) | ORAL | Status: DC

## 2022-09-15 NOTE — Progress Notes (Signed)
  Echocardiogram 2D Echocardiogram has been performed.  Milda Smart 09/15/2022, 2:01 PM

## 2022-09-15 NOTE — Progress Notes (Signed)
PROGRESS NOTE    Jonathan Gonzalez  [id number] DOB: [birth year] DOA: 09/14/2022 PCP: Gildardo Pounds, NP  No chief complaint on file.   Brief Narrative:  Jonathan Gonzalez is Jonathan Gonzalez 61 y.o. male with medical history significant of diabetes, hypertension, hyperlipidemia presenting with tachycardia and new Jonathan Gonzalez-fib rhythm.   Patient was at his PCP office today and noted to be tachycardic and EKG was performed which showed new onset Jonathan Gonzalez-fib.  Patient reports mild intermittent shortness of breath but otherwise has been asymptomatic and has no symptoms at rest.  He states he is not sure how long it has been going on.   He denies fevers, chills, chest pain, abdominal pain, constipation, diarrhea, nausea, vomiting.  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  Unsure how long it has been going because he only has minimal symptoms of intermittent shortness of breath.  Noted to be tachycardic with new onset Jonathan Gonzalez-fib at PCP visit today. -currently on diltiazem, given mildly reduced EF, will try to transition to metoprolol -metoprolol 25 mg BID, adjust as needed -titrate off dilt gtt as able -TSH pending -echo with EF 45 to 50%, global hypokinesis -chadsvasc is at least 3, continue eliquis -on lisinopril, farxiga.  Plan to transition metop to succinate when time for discharge.  -consider cardiology discussion prior to discharge - Jonathan Gonzalez-fib clinic referral (order has been placed)   Abnormal CXR - patchy bibasilar opacities - no significant resp symptoms  Hypertension - Continue home lisinopril - AV nodal blockers for rate control above   Hyperlipidemia - Continue home atorvastatin - LDL 73   Diabetes - Continue home Farxiga - hold metformin - follow A1c   ?Bedbugs > Patient placed on contact  precautions in the ED due to staff for porting they had noticed bedbugs on the patient. - Continue precautions    DVT prophylaxis: eliquis Code Status: full Family Communication: none Disposition:   Status is: Observation The patient will require care spanning > 2 midnights and should be moved to inpatient because: continued rvr   Consultants:  none  Procedures:  Echo IMPRESSIONS     1. Left ventricular ejection fraction, by estimation, is 45 to 50%. The  left ventricle has mildly decreased function. The left ventricle  demonstrates global hypokinesis. Left ventricular diastolic function could  not be evaluated.   2. Right ventricular systolic function is normal. The right ventricular  size is normal. Tricuspid regurgitation signal is inadequate for assessing  PA pressure.   3. Left atrial size was severely dilated.   4. The mitral valve is normal in structure. Mild mitral valve  regurgitation.   5. The aortic valve is tricuspid. Aortic valve regurgitation is trivial.  Aortic valve sclerosis is present, with no evidence of aortic valve  stenosis.   6. The inferior vena cava is normal in size with greater than 50%  respiratory variability, suggesting right atrial pressure of 3 mmHg.   Antimicrobials:  Anti-infectives (From admission, onward)    None       Subjective: No complaints  Objective: Vitals:   09/15/22 0435 09/15/22 0827 09/15/22 1246 09/15/22 1448  BP: 105/76 129/85 127/74 (!) 135/93  Pulse: 82 (!) 45 81 94  Resp: 18 16 18 18   Temp: 98.2 F (36.8 C) 98.4 F (36.9 C) 98  F (36.7 C)   TempSrc: Oral Oral Oral   SpO2: 94% 96% 97% 97%  Weight:      Height:        Intake/Output Summary (Last 24 hours) at 09/15/2022 1642 Last data filed at 09/15/2022 0940 Gross per 24 hour  Intake 305.44 ml  Output 350 ml  Net -44.56 ml   Filed Weights   09/14/22 1446 09/14/22 2138  Weight: 108.4 kg 105.1 kg    Examination:  General exam: Appears calm  and comfortable  Respiratory system: unlabored Cardiovascular system: irregularly irregular, tachycardic Gastrointestinal system: Abdomen is nondistended, soft and nontender. Central nervous system: Alert and oriented. No focal neurological deficits. Extremities: no LEE   Data Reviewed: I have personally reviewed following labs and imaging studies  CBC: Recent Labs  Lab 09/14/22 1517 09/15/22 0143  WBC 9.0 9.3  NEUTROABS 6.1 6.1  HGB 12.7* 11.7*  HCT 41.0 37.3*  MCV 76.4* 75.1*  PLT 347 [id number]    Basic Metabolic Panel: Recent Labs  Lab 09/14/22 1517 09/15/22 0143  NA 140 138  K 4.6 3.9  CL 108 110  CO2 23 22  GLUCOSE 97 88  BUN 10 10  CREATININE 1.14 1.00  CALCIUM 8.9 8.7*  MG 2.3 2.0  PHOS  --  2.9    GFR: Estimated Creatinine Clearance: 90 mL/min (by C-G formula based on SCr of 1 mg/dL).  Liver Function Tests: Recent Labs  Lab 09/15/22 0143  AST 21  ALT 18  ALKPHOS 44  BILITOT 0.7  PROT 6.1*  ALBUMIN 3.2*    CBG: Recent Labs  Lab 09/14/22 2138  GLUCAP 111*     Recent Results (from the past 240 hour(s))  Resp panel by RT-PCR (RSV, Flu Jonathan Gonzalez&B, Covid) Anterior Nasal Swab     Status: None   Collection Time: 09/14/22  3:19 PM   Specimen: Anterior Nasal Swab  Result Value Ref Range Status   SARS Coronavirus 2 by RT PCR NEGATIVE NEGATIVE Final    Comment: (NOTE) SARS-CoV-2 target nucleic acids are NOT DETECTED.  The SARS-CoV-2 RNA is generally detectable in upper respiratory specimens during the acute phase of infection. The lowest concentration of SARS-CoV-2 viral copies this assay can detect is 138 copies/mL. Jonathan Gonzalez negative result does not preclude SARS-Cov-2 infection and should not be used as the sole basis for treatment or other patient management decisions. Jonathan Gonzalez negative result may occur with  improper specimen collection/handling, submission of specimen other than nasopharyngeal swab, presence of viral mutation(s) within the areas targeted by this  assay, and inadequate number of viral copies(<138 copies/mL). Jonathan Gonzalez negative result must be combined with clinical observations, patient history, and epidemiological information. The expected result is Negative.  Fact Sheet for Patients:  EntrepreneurPulse.com.au  Fact Sheet for Healthcare Providers:  IncredibleEmployment.be  This test is no t yet approved or cleared by the [place] FDA and  has been authorized for detection and/or diagnosis of SARS-CoV-2 by FDA under an Emergency Use Authorization (EUA). This EUA will remain  in effect (meaning this test can be used) for the duration of the COVID-19 declaration under Section 564(b)(1) of the Act, 21 U.S.C.section 360bbb-3(b)(1), unless the authorization is terminated  or revoked sooner.       Influenza [place] by PCR NEGATIVE NEGATIVE Final   Influenza B by PCR NEGATIVE NEGATIVE Final    Comment: (NOTE) The Xpert Xpress SARS-CoV-2/FLU/RSV plus assay is intended as an aid in the diagnosis of influenza from Nasopharyngeal swab specimens and should not  be used as Serene Kopf sole basis for treatment. Nasal washings and aspirates are unacceptable for Xpert Xpress SARS-CoV-2/FLU/RSV testing.  Fact Sheet for Patients: EntrepreneurPulse.com.au  Fact Sheet for Healthcare Providers: IncredibleEmployment.be  This test is not yet approved or cleared by the [place] FDA and has been authorized for detection and/or diagnosis of SARS-CoV-2 by FDA under an Emergency Use Authorization (EUA). This EUA will remain in effect (meaning this test can be used) for the duration of the COVID-19 declaration under Section 564(b)(1) of the Act, 21 U.S.C. section 360bbb-3(b)(1), unless the authorization is terminated or revoked.     Resp Syncytial Virus by PCR NEGATIVE NEGATIVE Final    Comment: (NOTE) Fact Sheet for Patients: EntrepreneurPulse.com.au  Fact Sheet for  Healthcare Providers: IncredibleEmployment.be  This test is not yet approved or cleared by the [place] FDA and has been authorized for detection and/or diagnosis of SARS-CoV-2 by FDA under an Emergency Use Authorization (EUA). This EUA will remain in effect (meaning this test can be used) for the duration of the COVID-19 declaration under Section 564(b)(1) of the Act, 21 U.S.C. section 360bbb-3(b)(1), unless the authorization is terminated or revoked.  Performed at [facility], [place] [address] [place], [place] [address]          Radiology Studies: ECHOCARDIOGRAM COMPLETE  Result Date: 09/15/2022    ECHOCARDIOGRAM REPORT   Patient Name:   JACKSEN CECERE Date of Exam: 09/15/2022 Medical Rec #:  [id number]        Height:       68.0 in Accession #:    [id number]       Weight:       231.8 lb Date of Birth:  Oct 28, 1960        BSA:          2.176 m Patient Age:    77 years         BP:           127/74 mmHg Patient Gender: M                HR:           118 bpm. Exam Location:  Inpatient Procedure: 2D Echo, Cardiac Doppler and Color Doppler Indications:    Atrial Fibrillation  History:        Patient has no prior history of Echocardiogram examinations.                 Arrythmias:Tachycardia and Atrial Fibrillation,                 Signs/Symptoms:Shortness of Breath; Risk Factors:Diabetes,                 Hypertension and Dyslipidemia.  Sonographer:    Eartha Inch Referring Phys: Neva Seat, B IMPRESSIONS  1. Left ventricular ejection fraction, by estimation, is 45 to 50%. The left ventricle has mildly decreased function. The left ventricle demonstrates global hypokinesis. Left ventricular diastolic function could not be evaluated.  2. Right ventricular systolic function is normal. The right ventricular size is normal. Tricuspid regurgitation signal is inadequate for assessing PA pressure.  3. Left atrial size was severely dilated.  4. The mitral valve is  normal in structure. Mild mitral valve regurgitation.  5. The aortic valve is tricuspid. Aortic valve regurgitation is trivial. Aortic valve sclerosis is present, with no evidence of aortic valve stenosis.  6. The inferior vena cava is normal in size with greater than 50% respiratory variability, suggesting right  atrial pressure of 3 mmHg. FINDINGS  Left Ventricle: Left ventricular ejection fraction, by estimation, is 45 to 50%. The left ventricle has mildly decreased function. The left ventricle demonstrates global hypokinesis. The left ventricular internal cavity size was normal in size. There is  no left ventricular hypertrophy. Left ventricular diastolic function could not be evaluated due to atrial fibrillation. Left ventricular diastolic function could not be evaluated. Right Ventricle: The right ventricular size is normal. No increase in right ventricular wall thickness. Right ventricular systolic function is normal. Tricuspid regurgitation signal is inadequate for assessing PA pressure. Left Atrium: Left atrial size was severely dilated. Right Atrium: Right atrial size was normal in size. Pericardium: There is no evidence of pericardial effusion. Mitral Valve: The mitral valve is normal in structure. Mild mitral valve regurgitation. Tricuspid Valve: The tricuspid valve is normal in structure. Tricuspid valve regurgitation is trivial. Aortic Valve: The aortic valve is tricuspid. Aortic valve regurgitation is trivial. Aortic valve sclerosis is present, with no evidence of aortic valve stenosis. Pulmonic Valve: The pulmonic valve was normal in structure. Pulmonic valve regurgitation is not visualized. Aorta: The aortic root and ascending aorta are structurally normal, with no evidence of dilitation. Venous: The inferior vena cava is normal in size with greater than 50% respiratory variability, suggesting right atrial pressure of 3 mmHg. IAS/Shunts: No atrial level shunt detected by color flow Doppler. Thurmon Fair MD Electronically signed by Thurmon Fair MD Signature Date/Time: 09/15/2022/2:31:35 PM    Final    DG Chest Port 1 View  Result Date: 09/14/2022 CLINICAL DATA:  Shortness of breath EXAM: PORTABLE CHEST 1 VIEW COMPARISON:  Chest radiograph dated 10/16/2007 FINDINGS: Low lung volumes. Bibasilar patchy opacities no pleural effusion or pneumothorax. The heart size and mediastinal contours are within normal limits. The visualized skeletal structures are unremarkable. IMPRESSION: Low lung volumes with bibasilar patchy opacities, likely atelectasis. Electronically Signed   By: Agustin Cree M.D.   On: 09/14/2022 16:03    Scheduled Meds:  apixaban  5 mg Oral BID   atorvastatin  10 mg Oral Daily   dapagliflozin propanediol  5 mg Oral QAC breakfast   lisinopril  20 mg Oral Daily   metFORMIN  500 mg Oral BID WC   Continuous Infusions:  diltiazem (CARDIZEM) infusion 10 mg/hr (09/15/22 1500)     LOS: 0 days    Time spent: over 30 min    Lacretia Nicks, MD [facility]   To contact the attending provider between 7A-7P or the covering provider during after hours 7P-7A, please log into the web site [email or web address] and access using universal [place] password for that web site. If you do not have the password, please call the hospital operator.  09/15/2022, 4:42 PM

## 2022-09-15 NOTE — Plan of Care (Signed)

## 2022-09-16 ENCOUNTER — Encounter: Payer: Self-pay | Admitting: Physician Assistant

## 2022-09-16 DIAGNOSIS — I4891 Unspecified atrial fibrillation: Secondary | ICD-10-CM | POA: Diagnosis not present

## 2022-09-16 LAB — CBC WITH DIFFERENTIAL/PLATELET
Abs Immature Granulocytes: 0.02 10*3/uL (ref 0.00–0.07)
Basophils Absolute: 0.1 10*3/uL (ref 0.0–0.1)
Basophils Relative: 1 %
Eosinophils Absolute: 0.3 10*3/uL (ref 0.0–0.5)
Eosinophils Relative: 3 %
HCT: 38.9 % — ABNORMAL LOW (ref 39.0–52.0)
Hemoglobin: 11.9 g/dL — ABNORMAL LOW (ref 13.0–17.0)
Immature Granulocytes: 0 %
Lymphocytes Relative: 24 %
Lymphs Abs: 2 10*3/uL (ref 0.7–4.0)
MCH: 23.2 pg — ABNORMAL LOW (ref 26.0–34.0)
MCHC: 30.6 g/dL (ref 30.0–36.0)
MCV: 75.7 fL — ABNORMAL LOW (ref 80.0–100.0)
Monocytes Absolute: 0.7 10*3/uL (ref 0.1–1.0)
Monocytes Relative: 8 %
Neutro Abs: 5.2 10*3/uL (ref 1.7–7.7)
Neutrophils Relative %: 64 %
Platelets: 310 10*3/uL (ref 150–400)
RBC: 5.14 MIL/uL (ref 4.22–5.81)
RDW: 17 % — ABNORMAL HIGH (ref 11.5–15.5)
WBC: 8.3 10*3/uL (ref 4.0–10.5)
nRBC: 0 % (ref 0.0–0.2)

## 2022-09-16 LAB — COMPREHENSIVE METABOLIC PANEL
ALT: 20 U/L (ref 0–44)
AST: 20 U/L (ref 15–41)
Albumin: 3.2 g/dL — ABNORMAL LOW (ref 3.5–5.0)
Alkaline Phosphatase: 46 U/L (ref 38–126)
Anion gap: 5 (ref 5–15)
BUN: 12 mg/dL (ref 8–23)
CO2: 24 mmol/L (ref 22–32)
Calcium: 8.8 mg/dL — ABNORMAL LOW (ref 8.9–10.3)
Chloride: 102 mmol/L (ref 98–111)
Creatinine, Ser: 1.23 mg/dL (ref 0.61–1.24)
GFR, Estimated: >60 mL/min (ref >60)
Glucose, Bld: 95 mg/dL (ref 70–99)
Potassium: 3.7 mmol/L (ref 3.5–5.1)
Sodium: 131 mmol/L — ABNORMAL LOW (ref 135–145)
Total Bilirubin: 0.7 mg/dL (ref 0.3–1.2)
Total Protein: 6.1 g/dL — ABNORMAL LOW (ref 6.5–8.1)

## 2022-09-16 LAB — MAGNESIUM: Magnesium: 1.9 mg/dL (ref 1.7–2.4)

## 2022-09-16 LAB — PHOSPHORUS: Phosphorus: 3.5 mg/dL (ref 2.5–4.6)

## 2022-09-16 LAB — GLUCOSE, CAPILLARY: Glucose-Capillary: 93 mg/dL (ref 70–99)

## 2022-09-16 LAB — TSH: TSH: 2.367 u[IU]/mL (ref 0.350–4.500)

## 2022-09-16 MED ORDER — FUROSEMIDE 10 MG/ML IJ SOLN
40.0000 mg | Freq: Every day | INTRAMUSCULAR | Status: DC
  Administered 2022-09-17: 40 mg via INTRAVENOUS
  Filled 2022-09-16: qty 4

## 2022-09-16 MED ORDER — METOPROLOL TARTRATE 50 MG PO TABS
50.0000 mg | ORAL_TABLET | Freq: Two times a day (BID) | ORAL | Status: DC
  Administered 2022-09-16: 50 mg via ORAL
  Filled 2022-09-16: qty 1

## 2022-09-16 MED ORDER — METOPROLOL TARTRATE 25 MG PO TABS
25.0000 mg | ORAL_TABLET | Freq: Once | ORAL | Status: AC
  Administered 2022-09-16: 25 mg via ORAL
  Filled 2022-09-16: qty 1

## 2022-09-16 MED ORDER — METOPROLOL TARTRATE 50 MG PO TABS
75.0000 mg | ORAL_TABLET | Freq: Two times a day (BID) | ORAL | Status: DC
  Administered 2022-09-16 – 2022-09-17 (×2): 75 mg via ORAL
  Filled 2022-09-16 (×2): qty 1

## 2022-09-16 MED ORDER — FUROSEMIDE 10 MG/ML IJ SOLN
40.0000 mg | Freq: Once | INTRAMUSCULAR | Status: AC
  Administered 2022-09-16: 40 mg via INTRAVENOUS
  Filled 2022-09-16: qty 4

## 2022-09-16 MED ORDER — METOPROLOL TARTRATE 5 MG/5ML IV SOLN
5.0000 mg | INTRAVENOUS | Status: DC | PRN
  Administered 2022-09-16 (×2): 5 mg via INTRAVENOUS
  Filled 2022-09-16 (×2): qty 5

## 2022-09-16 NOTE — Plan of Care (Signed)

## 2022-09-16 NOTE — Progress Notes (Signed)
Dr. Lowell Guitar is requesting new patient early follow-up post-discharge for newly diagnosed afib, mild cardiomyopathy. Msg sent to scheduling team to assist and they will call pt with appt info.

## 2022-09-16 NOTE — Progress Notes (Signed)
PROGRESS NOTE    Jonathan Gonzalez  [id number] DOB: [birth year] DOA: 09/14/2022 PCP: Jonathan Pounds, NP  No chief complaint on file.   Brief Narrative:  Jonathan Gonzalez is Jonathan Gonzalez 61 y.o. male with medical history significant of diabetes, hypertension, hyperlipidemia presenting with tachycardia and new Jonathan Gonzalez-fib rhythm.   Patient was at his PCP office today and noted to be tachycardic and EKG was performed which showed new onset Jonathan Gonzalez.  Patient reports mild intermittent shortness of breath but otherwise has been asymptomatic and has no symptoms at rest.  He states he is not sure how long it has been going on.   He denies fevers, chills, chest pain, abdominal pain, constipation, diarrhea, nausea, vomiting.  Assessment & Plan:   Principal Problem:   Atrial fibrillation with RVR (HCC) Active Problems:   DM (diabetes mellitus) ([place])   HTN (hypertension)   HLD (hyperlipidemia)  Atrial fibrillation with RVR -New onset Jonathan Gonzalez with RVR.  Unsure how long it has been going because he only has minimal symptoms of intermittent shortness of breath.  Noted to be tachycardic with new onset Jonathan Gonzalez at PCP visit today. -metoprolol 75 mg BID, uptitrate as needed -IV metop prn  -TSH wnl -echo with EF 45 to 50%, global hypokinesis -chadsvasc is at least 3, continue eliquis -consider cardiology c/s if rate remains difficult to control - Jonathan Gonzalez-fib clinic referral (order has been placed)   HFmrEF with Mild Exacerbation - trace edema, orthopnea - IV lasix 40 mg daily - on lisinopril, farxiga.  Plan to transition metop to succinate when time for discharge.  - will arrange cardiology f/u  Abnormal CXR - patchy bibasilar opacities - no significant resp symptoms - repeat CXR outpatient   Hypertension - Continue home lisinopril - AV nodal blockers for rate control above   Hyperlipidemia - Continue home atorvastatin - LDL 73   Diabetes - Continue home Farxiga - hold metformin - follow A1c (6.6)    ?Bedbugs > Patient placed on contact precautions in the ED due to staff for porting they had noticed bedbugs on the patient. - Continue precautions    DVT prophylaxis: eliquis Code Status: full Family Communication: none Disposition:   Status is: Observation The patient will require care spanning > 2 midnights and should be moved to inpatient because: continued rvr   Consultants:  none  Procedures:  Echo IMPRESSIONS     1. Left ventricular ejection fraction, by estimation, is 45 to 50%. The  left ventricle has mildly decreased function. The left ventricle  demonstrates global hypokinesis. Left ventricular diastolic function could  not be evaluated.   2. Right ventricular systolic function is normal. The right ventricular  size is normal. Tricuspid regurgitation signal is inadequate for assessing  PA pressure.   3. Left atrial size was severely dilated.   4. The mitral valve is normal in structure. Mild mitral valve  regurgitation.   5. The aortic valve is tricuspid. Aortic valve regurgitation is trivial.  Aortic valve sclerosis is present, with no evidence of aortic valve  stenosis.   6. The inferior vena cava is normal in size with greater than 50%  respiratory variability, suggesting right atrial pressure of 3 mmHg.   Antimicrobials:  Anti-infectives (From admission, onward)    None       Subjective: No complaints, but admits to SOB when lying down  Objective: Vitals:   09/16/22 0759 09/16/22 0940 09/16/22 1220 09/16/22 1231  BP:  114/79  112/82  Pulse: 77 (!) 112  83 77  Resp:  18  18  Temp:    98.6 F (37 C)  TempSrc:    Oral  SpO2: 97% 99% 96% 98%  Weight:      Height:        Intake/Output Summary (Last 24 hours) at 09/16/2022 1629 Last data filed at 09/16/2022 0900 Gross per 24 hour  Intake 274.26 ml  Output 800 ml  Net -525.74 ml   Filed Weights   09/14/22 1446 09/14/22 2138  Weight: 108.4 kg 105.1 kg    Examination:  General: No  acute distress. Cardiovascular: irregularly irregular, tachycardic Lungs: bibasilar atelectasis Abdomen: Soft, nontender, nondistended Neurological: Alert and oriented 3. Moves all extremities 4 with equal strength. Cranial nerves II through XII grossly intact. Extremities: trace edema   Data Reviewed: I have personally reviewed following labs and imaging studies  CBC: Recent Labs  Lab 09/14/22 1517 09/15/22 0143 09/16/22 0128  WBC 9.0 9.3 8.3  NEUTROABS 6.1 6.1 5.2  HGB 12.7* 11.7* 11.9*  HCT 41.0 37.3* 38.9*  MCV 76.4* 75.1* 75.7*  PLT 347 289 [id number]    Basic Metabolic Panel: Recent Labs  Lab 09/14/22 1517 09/15/22 0143 09/16/22 0128  NA 140 138 131*  K 4.6 3.9 3.7  CL 108 110 102  CO2 23 22 24   GLUCOSE 97 88 95  BUN 10 10 12   CREATININE 1.14 1.00 1.23  CALCIUM 8.9 8.7* 8.8*  MG 2.3 2.0 1.9  PHOS  --  2.9 3.5    GFR: Estimated Creatinine Clearance: 73.2 mL/min (by C-G formula based on SCr of 1.23 mg/dL).  Liver Function Tests: Recent Labs  Lab 09/15/22 0143 09/16/22 0128  AST 21 20  ALT 18 20  ALKPHOS 44 46  BILITOT 0.7 0.7  PROT 6.1* 6.1*  ALBUMIN 3.2* 3.2*    CBG: Recent Labs  Lab 09/14/22 2138  GLUCAP 111*     Recent Results (from the past 240 hour(s))  Resp panel by RT-PCR (RSV, Flu Jonathan Gonzalez, Covid) Anterior Nasal Swab     Status: None   Collection Time: 09/14/22  3:19 PM   Specimen: Anterior Nasal Swab  Result Value Ref Range Status   SARS Coronavirus 2 by RT PCR NEGATIVE NEGATIVE Final    Comment: (NOTE) SARS-CoV-2 target nucleic acids are NOT DETECTED.  The SARS-CoV-2 RNA is generally detectable in upper respiratory specimens during the acute phase of infection. The lowest concentration of SARS-CoV-2 viral copies this assay can detect is 138 copies/mL. Jonathan Gonzalez negative result does not preclude SARS-Cov-2 infection and should not be used as the sole basis for treatment or other patient management decisions. Jonathan Gonzalez negative result may occur with   improper specimen collection/handling, submission of specimen other than nasopharyngeal swab, presence of viral mutation(s) within the areas targeted by this assay, and inadequate number of viral copies(<138 copies/mL). Jonathan Gonzalez negative result must be combined with clinical observations, patient history, and epidemiological information. The expected result is Negative.  Fact Sheet for Patients:  EntrepreneurPulse.com.au  Fact Sheet for Healthcare Providers:  IncredibleEmployment.be  This test is no t yet approved or cleared by the [place] FDA and  has been authorized for detection and/or diagnosis of SARS-CoV-2 by FDA under an Emergency Use Authorization (EUA). This EUA will remain  in effect (meaning this test can be used) for the duration of the COVID-19 declaration under Section 564(b)(1) of the Act, 21 U.S.C.section 360bbb-3(b)(1), unless the authorization is terminated  or revoked sooner.       Influenza Jonathan Gonzalez  by PCR NEGATIVE NEGATIVE Final   Influenza B by PCR NEGATIVE NEGATIVE Final    Comment: (NOTE) The Xpert Xpress SARS-CoV-2/FLU/RSV plus assay is intended as an aid in the diagnosis of influenza from Nasopharyngeal swab specimens and should not be used as Jonathan Gonzalez sole basis for treatment. Nasal washings and aspirates are unacceptable for Xpert Xpress SARS-CoV-2/FLU/RSV testing.  Fact Sheet for Patients: EntrepreneurPulse.com.au  Fact Sheet for Healthcare Providers: IncredibleEmployment.be  This test is not yet approved or cleared by the [place] FDA and has been authorized for detection and/or diagnosis of SARS-CoV-2 by FDA under an Emergency Use Authorization (EUA). This EUA will remain in effect (meaning this test can be used) for the duration of the COVID-19 declaration under Section 564(b)(1) of the Act, 21 U.S.C. section 360bbb-3(b)(1), unless the authorization is terminated or revoked.      Resp Syncytial Virus by PCR NEGATIVE NEGATIVE Final    Comment: (NOTE) Fact Sheet for Patients: EntrepreneurPulse.com.au  Fact Sheet for Healthcare Providers: IncredibleEmployment.be  This test is not yet approved or cleared by the [place] FDA and has been authorized for detection and/or diagnosis of SARS-CoV-2 by FDA under an Emergency Use Authorization (EUA). This EUA will remain in effect (meaning this test can be used) for the duration of the COVID-19 declaration under Section 564(b)(1) of the Act, 21 U.S.C. section 360bbb-3(b)(1), unless the authorization is terminated or revoked.  Performed at [facility], [place] [address], [place], [place] [address]          Radiology Studies: Jonathan COMPLETE  Result Date: 09/15/2022    Jonathan REPORT   Patient Name:   BROLY KLEPINGER Date of Exam: 09/15/2022 Medical Rec #:  [id number]        Height:       68.0 in Accession #:    [id number]       Weight:       231.8 lb Date of Birth:  1960/11/16        BSA:          2.176 m Patient Age:    14 years         BP:           127/74 mmHg Patient Gender: M                HR:           118 bpm. Exam Location:  Inpatient Procedure: 2D Echo, Cardiac Doppler and Color Doppler Indications:    Atrial Fibrillation  History:        Patient has no prior history of Jonathan examinations.                 Arrythmias:Tachycardia and Atrial Fibrillation,                 Signs/Symptoms:Shortness of Breath; Risk Factors:Diabetes,                 Hypertension and Dyslipidemia.  Sonographer:    Eartha Inch Referring Phys: Neva Seat, B IMPRESSIONS  1. Left ventricular ejection fraction, by estimation, is 45 to 50%. The left ventricle has mildly decreased function. The left ventricle demonstrates global hypokinesis. Left ventricular diastolic function could not be evaluated.  2. Right ventricular systolic function is normal. The right  ventricular size is normal. Tricuspid regurgitation signal is inadequate for assessing PA pressure.  3. Left atrial size was severely dilated.  4. The mitral valve is normal in structure. Mild mitral valve regurgitation.  5. The aortic valve is tricuspid. Aortic valve regurgitation is trivial. Aortic valve sclerosis is present, with no evidence of aortic valve stenosis.  6. The inferior vena cava is normal in size with greater than 50% respiratory variability, suggesting right atrial pressure of 3 mmHg. FINDINGS  Left Ventricle: Left ventricular ejection fraction, by estimation, is 45 to 50%. The left ventricle has mildly decreased function. The left ventricle demonstrates global hypokinesis. The left ventricular internal cavity size was normal in size. There is  no left ventricular hypertrophy. Left ventricular diastolic function could not be evaluated due to atrial fibrillation. Left ventricular diastolic function could not be evaluated. Right Ventricle: The right ventricular size is normal. No increase in right ventricular wall thickness. Right ventricular systolic function is normal. Tricuspid regurgitation signal is inadequate for assessing PA pressure. Left Atrium: Left atrial size was severely dilated. Right Atrium: Right atrial size was normal in size. Pericardium: There is no evidence of pericardial effusion. Mitral Valve: The mitral valve is normal in structure. Mild mitral valve regurgitation. Tricuspid Valve: The tricuspid valve is normal in structure. Tricuspid valve regurgitation is trivial. Aortic Valve: The aortic valve is tricuspid. Aortic valve regurgitation is trivial. Aortic valve sclerosis is present, with no evidence of aortic valve stenosis. Pulmonic Valve: The pulmonic valve was normal in structure. Pulmonic valve regurgitation is not visualized. Aorta: The aortic root and ascending aorta are structurally normal, with no evidence of dilitation. Venous: The inferior vena cava is normal in size  with greater than 50% respiratory variability, suggesting right atrial pressure of 3 mmHg. IAS/Shunts: No atrial level shunt detected by color flow Doppler. Rachelle Hora Croitoru MD Electronically signed by Thurmon Fair MD Signature Date/Time: 09/15/2022/2:31:35 PM    Final     Scheduled Meds:  apixaban  5 mg Oral BID   atorvastatin  10 mg Oral Daily   dapagliflozin propanediol  5 mg Oral QAC breakfast   lisinopril  20 mg Oral Daily   metoprolol tartrate  25 mg Oral Once   metoprolol tartrate  75 mg Oral BID   Continuous Infusions:     LOS: 1 day    Time spent: over 30 min    Lacretia Nicks, MD [facility]   To contact the attending provider between 7A-7P or the covering provider during after hours 7P-7A, please log into the web site [email or web address] and access using universal [place] password for that web site. If you do not have the password, please call the hospital operator.  09/16/2022, 4:29 PM

## 2022-09-17 DIAGNOSIS — I4891 Unspecified atrial fibrillation: Secondary | ICD-10-CM | POA: Diagnosis not present

## 2022-09-17 LAB — GLUCOSE, CAPILLARY
Glucose-Capillary: 153 mg/dL — ABNORMAL HIGH (ref 70–99)
Glucose-Capillary: 90 mg/dL (ref 70–99)
Glucose-Capillary: 99 mg/dL (ref 70–99)

## 2022-09-17 MED ORDER — METOPROLOL TARTRATE 100 MG PO TABS: 100.0000 mg | ORAL_TABLET | Freq: Two times a day (BID) | ORAL | Status: DC

## 2022-09-17 MED ORDER — METOPROLOL TARTRATE 50 MG PO TABS
75.0000 mg | ORAL_TABLET | Freq: Two times a day (BID) | ORAL | Status: DC
  Administered 2022-09-17: 75 mg via ORAL
  Filled 2022-09-17 (×2): qty 1

## 2022-09-17 MED ORDER — METOPROLOL TARTRATE 25 MG PO TABS: 25.0000 mg | ORAL_TABLET | Freq: Once | ORAL | Status: DC

## 2022-09-17 MED ORDER — FUROSEMIDE 40 MG PO TABS
40.0000 mg | ORAL_TABLET | Freq: Every day | ORAL | Status: DC
  Administered 2022-09-18: 40 mg via ORAL
  Filled 2022-09-17: qty 1

## 2022-09-17 NOTE — Progress Notes (Signed)
   09/17/22 1046  Assess: MEWS Score  Temp 98.2 F (36.8 C)  BP 103/82  MAP (mmHg) 89  Pulse Rate (!) 108  ECG Heart Rate (!) 112  Resp 20  Level of Consciousness Alert  SpO2 96 %  Assess: MEWS Score  MEWS Temp 0  MEWS Systolic 0  MEWS Pulse 2  MEWS RR 0  MEWS LOC 0  MEWS Score 2  MEWS Score Color Yellow  Assess: if the MEWS score is Yellow or Red  Were vital signs taken at a resting state? Yes  Focused Assessment No change from prior assessment  Does the patient meet 2 or more of the SIRS criteria? No  MEWS guidelines implemented *See Row Information* No, previously yellow, continue vital signs every 4 hours  Treat  MEWS Interventions Escalated (See documentation below)  Take Vital Signs  Increase Vital Sign Frequency  Yellow: Q 2hr X 2 then Q 4hr X 2, if remains yellow, continue Q 4hrs  Escalate  MEWS: Escalate Yellow: discuss with charge nurse/RN and consider discussing with provider and RRT  Notify: Charge Nurse/RN  Name of Charge Nurse/RN Notified Burns, RN  Date Charge Nurse/RN Notified 09/17/22  Time Charge Nurse/RN Notified 1127  Provider Notification  Provider Name/Title Marcelline Deist Powell/MD  Date Provider Notified 09/17/22  Time Provider Notified 39  Method of Notification  (secure chat)  Notification Reason  (Elevated HR with ambulation)  Provider response See new orders  Date of Provider Response 09/17/22  Time of Provider Response 1031  Document  Patient Outcome Stabilized after interventions  Progress note created (see row info) Yes  Assess: SIRS CRITERIA  SIRS Temperature  0  SIRS Pulse 1  SIRS Respirations  0  SIRS WBC 0  SIRS Score Sum  1

## 2022-09-17 NOTE — Progress Notes (Addendum)
PROGRESS NOTE    Jonathan Gonzalez  [id number] DOB: [birth year] DOA: 09/14/2022 PCP: Jonathan Pounds, NP  No chief complaint on file.   Brief Narrative:  Jonathan Gonzalez is Jonathan Gonzalez 62 y.o. male with medical history significant of diabetes, hypertension, hyperlipidemia presenting with tachycardia and new Jonathan Gonzalez rhythm.   Patient was at his PCP office today and noted to be tachycardic and EKG was performed which showed new onset Jonathan Gonzalez.  Patient reports mild intermittent shortness of breath but otherwise has been asymptomatic and has no symptoms at rest.  He states he is not sure how long it has been going on.   He denies fevers, chills, chest pain, abdominal pain, constipation, diarrhea, nausea, vomiting.  Assessment & Plan:   Principal Problem:   Atrial fibrillation with RVR (HCC) Active Problems:   DM (diabetes mellitus) (Jonathan Gonzalez)   HTN (hypertension)   HLD (hyperlipidemia)  Atrial fibrillation with RVR -New onset Jonathan Gonzalez with RVR.  Unsure how long it has been going because he only has minimal symptoms of intermittent shortness of breath.  Noted to be tachycardic with new onset Jonathan Gonzalez at PCP visit today. -metoprolol 75 mg BID, uptitrate as needed -BP soft, limiting titration of metoprolol - hold lisinopril with goal to maximize metoprolol  -IV metop prn  -TSH wnl -echo with EF 45 to 50%, global hypokinesis -chadsvasc is at least 3, continue eliquis -consider cardiology c/s if rate remains difficult to control (will try to maximize metop first) -Jonathan Gonzalez clinic referral (order has been placed)   HFmrEF with Mild Exacerbation - trace edema, orthopnea - volume status improved -> PO lasix -  farxiga. Lisinopril on hold with above.  Plan to transition metop to succinate when time for discharge.  - will arrange cardiology f/u  Abnormal CXR - patchy bibasilar opacities - no significant resp symptoms - repeat CXR outpatient   Hypertension - hold lisinopril with soft BP. - AV nodal  blockers for rate control above   Hyperlipidemia - Continue home atorvastatin - LDL 73   Diabetes - Continue home Farxiga - hold metformin - follow A1c (6.6)   ?Bedbugs > Patient placed on contact precautions in the ED due to staff for porting they had noticed bedbugs on the patient. - Continue precautions    DVT prophylaxis: eliquis Code Status: full Family Communication: none Disposition:   Status is: Observation The patient will require care spanning > 2 midnights and should be moved to inpatient because: continued rvr   Consultants:  none  Procedures:  Echo IMPRESSIONS     1. Left ventricular ejection fraction, by estimation, is 45 to 50%. The  left ventricle has mildly decreased function. The left ventricle  demonstrates global hypokinesis. Left ventricular diastolic function could  not be evaluated.   2. Right ventricular systolic function is normal. The right ventricular  size is normal. Tricuspid regurgitation signal is inadequate for assessing  PA pressure.   3. Left atrial size was severely dilated.   4. The mitral valve is normal in structure. Mild mitral valve  regurgitation.   5. The aortic valve is tricuspid. Aortic valve regurgitation is trivial.  Aortic valve sclerosis is present, with no evidence of aortic valve  stenosis.   6. The inferior vena cava is normal in size with greater than 50%  respiratory variability, suggesting right atrial pressure of 3 mmHg.   Antimicrobials:  Anti-infectives (From admission, onward)    None       Subjective: No complaints  Objective: Vitals:  09/17/22 1046 09/17/22 1223 09/17/22 1246 09/17/22 1446  BP: 103/82 98/77 98/77  93/67  Pulse: (!) 108 74 74 89  Resp: 20 18 18 18   Temp: 98.2 F (36.8 C) 97.8 F (36.6 C) 97.8 F (36.6 C) 98.8 F (37.1 C)  TempSrc:  Oral  Oral  SpO2: 96% 93% 93% 93%  Weight:      Height:       No intake or output data in the 24 hours ending 09/17/22 1555  Filed  Weights   09/14/22 1446 09/14/22 2138  Weight: 108.4 kg 105.1 kg    Examination:  General: No acute distress. Cardiovascular: irregularly irregular, tachy - tele in 90's and 100's --- walking up to 140's Lungs: Clear to auscultation bilaterally  Abdomen: Soft, nontender, nondistended  Neurological: Alert and oriented 3. Moves all extremities 4 with equal strength. Cranial nerves II through XII grossly intact. Extremities: No clubbing or cyanosis. No edema.   Data Reviewed: I have personally reviewed following labs and imaging studies  CBC: Recent Labs  Lab 09/14/22 1517 09/15/22 0143 09/16/22 0128  WBC 9.0 9.3 8.3  NEUTROABS 6.1 6.1 5.2  HGB 12.7* 11.7* 11.9*  HCT 41.0 37.3* 38.9*  MCV 76.4* 75.1* 75.7*  PLT 347 289 310    Basic Metabolic Panel: Recent Labs  Lab 09/14/22 1517 09/15/22 0143 09/16/22 0128  NA 140 138 131*  K 4.6 3.9 3.7  CL 108 110 102  CO2 23 22 24   GLUCOSE 97 88 95  BUN 10 10 12   CREATININE 1.14 1.00 1.23  CALCIUM 8.9 8.7* 8.8*  MG 2.3 2.0 1.9  PHOS  --  2.9 3.5    GFR: Estimated Creatinine Clearance: 73.2 mL/min (by C-G formula based on SCr of 1.23 mg/dL).  Liver Function Tests: Recent Labs  Lab 09/15/22 0143 09/16/22 0128  AST 21 20  ALT 18 20  ALKPHOS 44 46  BILITOT 0.7 0.7  PROT 6.1* 6.1*  ALBUMIN 3.2* 3.2*    CBG: Recent Labs  Lab 09/14/22 2138 09/16/22 2129 09/17/22 1051 09/17/22 1224  GLUCAP 111* 93 153* 99     Recent Results (from the past 240 hour(s))  Resp panel by RT-PCR (RSV, Flu Jonathan Gonzalez, Covid) Anterior Nasal Swab     Status: None   Collection Time: 09/14/22  3:19 PM   Specimen: Anterior Nasal Swab  Result Value Ref Range Status   SARS Coronavirus 2 by RT PCR NEGATIVE NEGATIVE Final    Comment: (NOTE) SARS-CoV-2 target nucleic acids are NOT DETECTED.  The SARS-CoV-2 RNA is generally detectable in upper respiratory specimens during the acute phase of infection. The lowest concentration of SARS-CoV-2 viral  copies this assay can detect is 138 copies/mL. Jonathan Gonzalez negative result does not preclude SARS-Cov-2 infection and should not be used as the sole basis for treatment or other patient management decisions. Jonathan Gonzalez negative result may occur with  improper specimen collection/handling, submission of specimen other than nasopharyngeal swab, presence of viral mutation(s) within the areas targeted by this assay, and inadequate number of viral copies(<138 copies/mL). Meleena Munroe negative result must be combined with clinical observations, patient history, and epidemiological information. The expected result is Negative.  Fact Sheet for Patients:  11/16/22  Fact Sheet for Healthcare Providers:  11/16/22  This test is no t yet approved or cleared by the 09/16/22 FDA and  has been authorized for detection and/or diagnosis of SARS-CoV-2 by FDA under an Emergency Use Authorization (EUA). This EUA will remain  in effect (meaning this test  can be used) for the duration of the COVID-19 declaration under Section 564(b)(1) of the Act, 21 U.S.C.section 360bbb-3(b)(1), unless the authorization is terminated  or revoked sooner.       Influenza Marieliz Strang by PCR NEGATIVE NEGATIVE Final   Influenza B by PCR NEGATIVE NEGATIVE Final    Comment: (NOTE) The Xpert Xpress SARS-CoV-2/FLU/RSV plus assay is intended as an aid in the diagnosis of influenza from Nasopharyngeal swab specimens and should not be used as Brandelyn Henne sole basis for treatment. Nasal washings and aspirates are unacceptable for Xpert Xpress SARS-CoV-2/FLU/RSV testing.  Fact Sheet for Patients: EntrepreneurPulse.com.au  Fact Sheet for Healthcare Providers: IncredibleEmployment.be  This test is not yet approved or cleared by the [place] FDA and has been authorized for detection and/or diagnosis of SARS-CoV-2 by FDA under an Emergency Use Authorization (EUA). This  EUA will remain in effect (meaning this test can be used) for the duration of the COVID-19 declaration under Section 564(b)(1) of the Act, 21 U.S.C. section 360bbb-3(b)(1), unless the authorization is terminated or revoked.     Resp Syncytial Virus by PCR NEGATIVE NEGATIVE Final    Comment: (NOTE) Fact Sheet for Patients: EntrepreneurPulse.com.au  Fact Sheet for Healthcare Providers: IncredibleEmployment.be  This test is not yet approved or cleared by the [place] FDA and has been authorized for detection and/or diagnosis of SARS-CoV-2 by FDA under an Emergency Use Authorization (EUA). This EUA will remain in effect (meaning this test can be used) for the duration of the COVID-19 declaration under Section 564(b)(1) of the Act, 21 U.S.C. section 360bbb-3(b)(1), unless the authorization is terminated or revoked.  Performed at [facility], [place] [address], [place], [place] [address]          Radiology Studies: No results found.  Scheduled Meds:  apixaban  5 mg Oral BID   atorvastatin  10 mg Oral Daily   dapagliflozin propanediol  5 mg Oral QAC breakfast   [START ON 09/18/2022] furosemide  40 mg Oral Daily   metoprolol tartrate  75 mg Oral BID   Continuous Infusions:     LOS: 2 days    Time spent: over 30 min    Fayrene Helper, MD [facility]   To contact the attending provider between 7A-7P or the covering provider during after hours 7P-7A, please log into the web site [email or web address] and access using universal [place] password for that web site. If you do not have the password, please call the hospital operator.  09/17/2022, 3:55 PM

## 2022-09-18 DIAGNOSIS — I4891 Unspecified atrial fibrillation: Secondary | ICD-10-CM | POA: Diagnosis not present

## 2022-09-18 DIAGNOSIS — I5020 Unspecified systolic (congestive) heart failure: Secondary | ICD-10-CM

## 2022-09-18 LAB — CBC
HCT: 47 % (ref 39.0–52.0)
Hemoglobin: 14.8 g/dL (ref 13.0–17.0)
MCH: 23.3 pg — ABNORMAL LOW (ref 26.0–34.0)
MCHC: 31.5 g/dL (ref 30.0–36.0)
MCV: 73.9 fL — ABNORMAL LOW (ref 80.0–100.0)
Platelets: 388 10*3/uL (ref 150–400)
RBC: 6.36 MIL/uL — ABNORMAL HIGH (ref 4.22–5.81)
RDW: 17.7 % — ABNORMAL HIGH (ref 11.5–15.5)
WBC: 10.5 10*3/uL (ref 4.0–10.5)
nRBC: 0 % (ref 0.0–0.2)

## 2022-09-18 LAB — BASIC METABOLIC PANEL
Anion gap: 12 (ref 5–15)
BUN: 27 mg/dL — ABNORMAL HIGH (ref 8–23)
CO2: 24 mmol/L (ref 22–32)
Calcium: 9.2 mg/dL (ref 8.9–10.3)
Chloride: 99 mmol/L (ref 98–111)
Creatinine, Ser: 1.5 mg/dL — ABNORMAL HIGH (ref 0.61–1.24)
GFR, Estimated: 52 mL/min — ABNORMAL LOW (ref >60)
Glucose, Bld: 91 mg/dL (ref 70–99)
Potassium: 3.9 mmol/L (ref 3.5–5.1)
Sodium: 135 mmol/L (ref 135–145)

## 2022-09-18 LAB — GLUCOSE, CAPILLARY
Glucose-Capillary: 85 mg/dL (ref 70–99)
Glucose-Capillary: 93 mg/dL (ref 70–99)
Glucose-Capillary: 98 mg/dL (ref 70–99)
Glucose-Capillary: 130 mg/dL — ABNORMAL HIGH (ref 70–99)

## 2022-09-18 MED ORDER — METOPROLOL TARTRATE 100 MG PO TABS
100.0000 mg | ORAL_TABLET | Freq: Two times a day (BID) | ORAL | Status: DC
  Administered 2022-09-18 – 2022-09-19 (×3): 100 mg via ORAL
  Filled 2022-09-18 (×3): qty 1

## 2022-09-18 MED ORDER — METOPROLOL TARTRATE 5 MG/5ML IV SOLN: 5.0000 mg | INTRAVENOUS | Status: DC | PRN

## 2022-09-18 NOTE — Discharge Instructions (Signed)

## 2022-09-18 NOTE — Consult Note (Addendum)
Cardiology Consultation   Patient ID: Jonathan Gonzalez MRN: [id number]; DOB: 12/12/1960  Admit date: 09/14/2022 Date of Consult: 09/18/2022  PCP:  Gildardo Pounds, NP   [place] Prov[facility] Cardiologist:  Candee Furbish, MD  (New)     Patient Profile:   Jonathan Gonzalez is a 62 y.o. male with a hx of diabetes, HTN, HLD who is being seen 09/18/2022 for the evaluation of atrial fibrillation at the request of Dr. Florene Glen.  History of Present Illness:   Jonathan Gonzalez is a 62 year old male with above medical history. Per chart review, patient does not have any cardiac history and has never been seen by a cardiologist.   Patient was seen at the [facility] on 12/29, and was found to be tachycardic with HR in the 120s. Was sent to the ED, where EKG showed atrial fibrillation with HR 150 BPM. He reported having intermittent SOB, but otherwise was asymptomatic. Patient was started on IV diltiazem. Echocardiogram on 09/15/22 showed EF 45-50%, normal RV systolic function, severely dilated LA. IV diltiazem was stopped, and patient was started on PO metoprolol. Patient was set for discharged on 12/31, and outpatient cardiology follow up has been arranged. However, HR became elevated, so patient was kept for improved rate control. Cardiology asked to consult.    On my interview, patient reports that prior to being admitted to the hospital, he had noticed shortness of breath on exertion. Also had infrequent episodes of dizziness upon standing, but dizziness would resolve after standing for a few moments. Rarely, he has episodes of chest pain that feel like a cramp. Pain occurs randomly, and is not associated with exertion. He denied palpitations, syncope, near syncope. Today, patient reports that his breathing has improved and he feels like he is in his usual state of health. Denies tobacco use. Denies history of heart attack or afib.   Past Medical History:   Diagnosis Date   Diabetes mellitus without complication ([place])    Hyperlipidemia    Hypertension     Past Surgical History:  Procedure Laterality Date   HERNIA REPAIR       Home Medications:  Prior to Admission medications   Medication Sig Start Date End Date Taking? Authorizing Provider  dapagliflozin propanediol (FARXIGA) 5 MG TABS tablet Take 1 tablet (5 mg total) by mouth daily before breakfast. 03/12/22  Yes Gildardo Pounds, NP  hydrOXYzine (VISTARIL) 25 MG capsule Take 1-2 capsules (25-50 mg total) by mouth at bedtime. For sleep 03/12/22  Yes Gildardo Pounds, NP  lisinopril (ZESTRIL) 20 MG tablet TAKE 1 TABLET BY MOUTH ONCE A DAY 03/12/22  Yes Gildardo Pounds, NP  metFORMIN (GLUCOPHAGE) 500 MG tablet Take 1 tablet (500 mg total) by mouth 2 (two) times daily with a meal. 03/12/22 09/16/22 Yes Gildardo Pounds, NP  omeprazole (PRILOSEC) 20 MG capsule Take 1 capsule (20 mg total) by mouth daily. FOR ACID REFLUX 03/12/22  Yes Gildardo Pounds, NP  atorvastatin (LIPITOR) 10 MG tablet Take 1 tablet (10 mg total) by mouth daily. For Cholesterol and diabetes Patient not taking: Reported on 09/16/2022 03/20/22   Gildardo Pounds, NP  Blood Glucose Monitoring Suppl (TRUE METRIX METER) w/Device KIT Use as instructed. Check blood glucose level by fingerstick twice per day. E11.65 Patient not taking: Reported on 09/14/2022 11/25/20   Gildardo Pounds, NP  glucose blood (TRUE METRIX BLOOD GLUCOSE TEST) test strip Use as instructed. Check blood  glucose level by fingerstick twice per day. NEEDS PASS Patient not taking: Reported on 09/14/2022 11/25/20   Gildardo Pounds, NP    Inpatient Medications: Scheduled Meds:  apixaban  5 mg Oral BID   atorvastatin  10 mg Oral Daily   dapagliflozin propanediol  5 mg Oral QAC breakfast   furosemide  40 mg Oral Daily   metoprolol tartrate  100 mg Oral BID   Continuous Infusions:  PRN Meds: acetaminophen, metoprolol tartrate, ondansetron (ZOFRAN)  IV  Allergies:   No Known Allergies  Social History:   Social History   Socioeconomic History   Marital status: Married    Spouse name: Not on file   Number of children: Not on file   Years of education: Not on file   Highest education level: Not on file  Occupational History   Not on file  Tobacco Use   Smoking status: Never   Smokeless tobacco: Never  Vaping Use   Vaping Use: Never used  Substance and Sexual Activity   Alcohol use: Never   Drug use: Never   Sexual activity: Not Currently  Other Topics Concern   Not on file  Social History Narrative   Not on file   Social Determinants of Health   Financial Resource Strain: Not on file  Food Insecurity: Not on file  Transportation Needs: Not on file  Physical Activity: Not on file  Stress: Not on file  Social Connections: Not on file  Intimate Partner Violence: Not on file    Family History:   History reviewed. No pertinent family history.   ROS:  Please see the history of present illness.   All other ROS reviewed and negative.     Physical Exam/Data:   Vitals:   09/17/22 2343 09/18/22 0404 09/18/22 0744 09/18/22 0852  BP: 97/79 99/73 112/86 107/76  Pulse: 82 85 (!) 114   Resp: _0 Temp: 98.5 F (36.9 C) 97.9 F (36.6 C) 97.6 F (36.4 C)   TempSrc: Oral Oral Oral   SpO2: 94% 95% 97% 94%  Weight:  101.3 kg    Height:       No intake or output data in the 24 hours ending 09/18/22 0922    09/18/2022    4:04 AM 09/14/2022    9:38 PM 09/14/2022    2:46 PM  Last 3 Weights  Weight (lbs) 223 lb 5.2 oz 231 lb 12.8 oz 239 lb  Weight (kg) 101.3 kg 105.144 kg 108.41 kg     Body mass index is 33.96 kg/m.  General:  Well nourished, well developed, in no acute distress. Appears older than stated age. Laying flat in the bed watching television  HEENT: normal Neck: no JVD Vascular: Radial pulses 2+ bilaterally Cardiac:  normal S1, S2; irregular rate and rhythm, tachycardic. No murmurs  Lungs:  clear  to auscultation bilaterally. Somewhat diminished breath sounds in lung bases. Normal WOB on room air  Abd: soft, nontender, not distended  Ext: no edema in BLE  Musculoskeletal:  No deformities, BUE and BLE strength normal and equal Skin: warm and dry  Neuro:  CNs 2-12 intact, no focal abnormalities noted Psych:  Normal affect   EKG:  The EKG was personally reviewed and demonstrates:  EKG from 12/29 showed atrial fibrillation, HR 150 BPM  Telemetry:  Telemetry was personally reviewed and demonstrates:  atrial fibrillation, HR in the 100s-140s  Relevant CV Studies:  Echocardiogram 09/15/22 1. Left ventricular ejection fraction, by estimation, is  45 to 50%. The  left ventricle has mildly decreased function. The left ventricle  demonstrates global hypokinesis. Left ventricular diastolic function could  not be evaluated.   2. Right ventricular systolic function is normal. The right ventricular  size is normal. Tricuspid regurgitation signal is inadequate for assessing  PA pressure.   3. Left atrial size was severely dilated.   4. The mitral valve is normal in structure. Mild mitral valve  regurgitation.   5. The aortic valve is tricuspid. Aortic valve regurgitation is trivial.  Aortic valve sclerosis is present, with no evidence of aortic valve  stenosis.   6. The inferior vena cava is normal in size with greater than 50%  respiratory variability, suggesting right atrial pressure of 3 mmHg.   Laboratory Data:  High Sensitivity Troponin:  No results for input(s): "TROPONINIHS" in the last 720 hours.   Chemistry Recent Labs  Lab 09/14/22 1517 09/15/22 0143 09/16/22 0128  NA 140 138 131*  K 4.6 3.9 3.7  CL 108 110 102  CO2 _0 GLUCOSE 97 88 95  BUN _1 CREATININE 1.14 1.00 1.23  CALCIUM 8.9 8.7* 8.8*  MG 2.3 2.0 1.9  GFRNONAA >60 >60 >60  ANIONGAP _2 Recent Labs  Lab 09/15/22 0143 09/16/22 0128  PROT 6.1* 6.1*  ALBUMIN 3.2* 3.2*  AST 21 20  ALT 18  20  ALKPHOS 44 46  BILITOT 0.7 0.7   Lipids  Recent Labs  Lab 09/15/22 0143  CHOL 123  TRIG 59  HDL 38*  LDLCALC 73  CHOLHDL 3.2    Hematology Recent Labs  Lab 09/14/22 1517 09/15/22 0143 09/16/22 0128  WBC 9.0 9.3 8.3  RBC 5.37 4.97 5.14  HGB 12.7* 11.7* 11.9*  HCT 41.0 37.3* 38.9*  MCV 76.4* 75.1* 75.7*  MCH 23.6* 23.5* 23.2*  MCHC 31.0 31.4 30.6  RDW 17.2* 17.1* 17.0*  PLT 347 289 310   Thyroid  Recent Labs  Lab 09/16/22 0128  TSH 2.367    BNP Recent Labs  Lab 09/14/22 1517  BNP 363.3*    DDimer No results for input(s): "DDIMER" in the last 168 hours.   Radiology/Studies:  ECHOCARDIOGRAM COMPLETE  Result Date: 09/15/2022    ECHOCARDIOGRAM REPORT   Patient Name:   Jonathan Gonzalez Date of Exam: 09/15/2022 Medical Rec #:  [id number]        Height:       68.0 in Accession #:    [id number]       Weight:       231.8 lb Date of Birth:  06/20/1961        BSA:          2.176 m Patient Age:    89 years         BP:           127/74 mmHg Patient Gender: M                HR:           118 bpm. Exam Location:  Inpatient Procedure: 2D Echo, Cardiac Doppler and Color Doppler Indications:    Atrial Fibrillation  History:        Patient has no prior history of Echocardiogram examinations.                 Arrythmias:Tachycardia and Atrial Fibrillation,  Signs/Symptoms:Shortness of Breath; Risk Factors:Diabetes,                 Hypertension and Dyslipidemia.  Sonographer:    Eartha Inch Referring Phys: Neva Seat, B IMPRESSIONS  1. Left ventricular ejection fraction, by estimation, is 45 to 50%. The left ventricle has mildly decreased function. The left ventricle demonstrates global hypokinesis. Left ventricular diastolic function could not be evaluated.  2. Right ventricular systolic function is normal. The right ventricular size is normal. Tricuspid regurgitation signal is inadequate for assessing PA pressure.  3. Left atrial size was severely dilated.  4.  The mitral valve is normal in structure. Mild mitral valve regurgitation.  5. The aortic valve is tricuspid. Aortic valve regurgitation is trivial. Aortic valve sclerosis is present, with no evidence of aortic valve stenosis.  6. The inferior vena cava is normal in size with greater than 50% respiratory variability, suggesting right atrial pressure of 3 mmHg. FINDINGS  Left Ventricle: Left ventricular ejection fraction, by estimation, is 45 to 50%. The left ventricle has mildly decreased function. The left ventricle demonstrates global hypokinesis. The left ventricular internal cavity size was normal in size. There is  no left ventricular hypertrophy. Left ventricular diastolic function could not be evaluated due to atrial fibrillation. Left ventricular diastolic function could not be evaluated. Right Ventricle: The right ventricular size is normal. No increase in right ventricular wall thickness. Right ventricular systolic function is normal. Tricuspid regurgitation signal is inadequate for assessing PA pressure. Left Atrium: Left atrial size was severely dilated. Right Atrium: Right atrial size was normal in size. Pericardium: There is no evidence of pericardial effusion. Mitral Valve: The mitral valve is normal in structure. Mild mitral valve regurgitation. Tricuspid Valve: The tricuspid valve is normal in structure. Tricuspid valve regurgitation is trivial. Aortic Valve: The aortic valve is tricuspid. Aortic valve regurgitation is trivial. Aortic valve sclerosis is present, with no evidence of aortic valve stenosis. Pulmonic Valve: The pulmonic valve was normal in structure. Pulmonic valve regurgitation is not visualized. Aorta: The aortic root and ascending aorta are structurally normal, with no evidence of dilitation. Venous: The inferior vena cava is normal in size with greater than 50% respiratory variability, suggesting right atrial pressure of 3 mmHg. IAS/Shunts: No atrial level shunt detected by color  flow Doppler. Sanda Klein MD Electronically signed by Sanda Klein MD Signature Date/Time: 09/15/2022/2:31:35 PM    Final    DG Chest Port 1 View  Result Date: 09/14/2022 CLINICAL DATA:  Shortness of breath EXAM: PORTABLE CHEST 1 VIEW COMPARISON:  Chest radiograph dated 10/16/2007 FINDINGS: Low lung volumes. Bibasilar patchy opacities no pleural effusion or pneumothorax. The heart size and mediastinal contours are within normal limits. The visualized skeletal structures are unremarkable. IMPRESSION: Low lung volumes with bibasilar patchy opacities, likely atelectasis. Electronically Signed   By: Darrin Nipper M.D.   On: 09/14/2022 16:03     Assessment and Plan:   Atrial Fibrillation, newly diagnosed  - Patient had gone to his PCP office on 12/29 and was found to be in afib, HR in the 120s-150s - Initially started on IV dilt, but echocardiogram showed a reduced EF of 45-50%, so it was stopped - Now, patient is on metoprolol tartrate 100 mg BID  - Per telemetry, patient had HR elevated to the 130s-140s this AM. However, after receiving his AM metoprolol, HR has improved to the 100s-110s. Predominantly in the 90s  - BP low end of normal-- no room to titrate BB further  -  Continue eliquis 5 mg BID-- after 3 weeks of uninterrupted anticoagulation, can consider outpatient cardioversion - Patient is young, so would not be an ideal candidate for amiodarone - BNP pending-- maintain K>4, mag>2   HFmrEF  - Echocardiogram this admission showed EF 45-50%, normal RV systolic function, severely dilated LA, mild mitral valve regurgitation  - Continue metoprolol 100 mg BID, farxia  - BP soft, so we cannot add additional GDMT at this time - Patient had complained of trace edema and orthopnea earlier this admission. Was given 2 doses of IV lasix 40 mg with improvement - Now on PO lasix 40 mg daily. Continue  - Likely that reduced EF is due to atrial fibrillation (patient is asymptomatic, so difficult to say  when afib began. He does have a severely dilated LA). However, patient also has a history of diabetes, HTN, and HLD, so underlying CAD is a possibility  - Patient denies having chest pain this admission. Rarely has chest pain at home, described as a cramping feeling. Pain is not associated with exertion or relieved by rest. If EF remains decreased after Afib is better controlled, we can consider outpatient nuclear stress test vs cath in the future. Patient not a candidate for coronary CT as he is currently in afib, but may be a candidate in the future if he converts   HTN  - BP managed with metoprolol as above   HLD  - Lipid panel 12/30 showed LDL 73, HDL 38, triglycerides 59  - Continue lipitor 10 mg daily    Risk Assessment/Risk Scores:    New York Heart Association (NYHA) Functional Class NYHA Class II  CHA2DS2-VASc Score = 3  This indicates a 3.2% annual risk of stroke. The patient's score is based upon: CHF History: 1 HTN History: 1 Diabetes History: 1 Stroke History: 0 Vascular Disease History: 0 Age Score: 0 Gender Score: 0    For questions or updates, please contact [place] Please consult [email or web address] for contact info under    Signed, Margie Billet, PA-C  09/18/2022 9:22 AM  Personally seen and examined. Agree with above.  62 year old with newly discovered atrial fibrillation with dilated left atrium and mildly reduced ejection fraction of 45% to 50%.  No chest pain.  Has had some mild shortness of breath with exertion.  Infrequent dizziness.  IV diltiazem was switched to metoprolol after EF was noted to be mildly reduced.  Irregularly irregular on exam.  Alert and oriented x 3.  Normal respiratory effort.  Pleasant.  Assessment and plan:  Atrial fibrillation paroxysmal - Newly discovered on 12/29 by PCP.  Switched him over to metoprolol, better rate control.  Lets see how he does today on current dosing strategy.  If reasonable, consider  discharge tomorrow. -Plan will be to in 3 weeks try outpatient cardioversion.  Cannot miss any doses of Eliquis.  Chronic anticoagulation - New start Eliquis 5 mg twice a day.  Watch for any bleeding signals.  Mildly reduced ejection fraction - On metoprolol 100 mg twice daily now, [place].  Blood pressure has been soft.  No room for Entresto.  He is on p.o. Lasix 40 mg a day. -Hopefully after atrial fibrillation is under better control or rhythm is restored, EF will improve.  Hyperlipidemia hypertension - Continue with metoprolol, Lipitor 10.  LDL 73.  Candee Furbish, MD

## 2022-09-18 NOTE — Care Management (Addendum)
  Transition of Care [facility]) Screening Note   Patient Details  Name: Jonathan Gonzalez Date of Birth: 1961/06/27   Transition of [facility] CM/SW Contact:    Bethena Roys, RN Phone Number: 09/18/2022, 1:11 PM    Transition of Care Department [facility]) has reviewed the patient and no TOC needs have been identified at this time. Patient states he is independent from a [employer]- room 312 with his spouse. Patient states he will need transportation home once stable to transition home. We will continue to monitor patient advancement through interdisciplinary progression rounds. If new patient transition needs arise, please place a TOC consult.

## 2022-09-18 NOTE — Progress Notes (Signed)
PROGRESS NOTE    Jonathan Gonzalez  TGG:[id number] DOB: 11/24/1960 DOA: 09/14/2022 PCP: Claiborne Rigg, NP  No chief complaint on file.   Brief Narrative:  Jonathan Gonzalez is Jonathan Gonzalez 62 y.o. male with medical history significant of diabetes, hypertension, hyperlipidemia presenting with tachycardia and new Jonathan Gonzalez rhythm.   Patient was at his PCP office today and noted to be tachycardic and EKG was performed which showed new onset Jonathan Gonzalez.  Patient reports mild intermittent shortness of breath but otherwise has been asymptomatic and has no symptoms at rest.  He states he is not sure how long it has been going on.   Assessment & Plan:   Principal Problem:   Atrial fibrillation with RVR (HCC) Active Problems:   DM (diabetes mellitus) (HCC)   HTN (hypertension)   HLD (hyperlipidemia)  Atrial fibrillation with RVR -New onset Jonathan Gonzalez with RVR.  Unsure how long it has been going because he only has minimal symptoms of intermittent shortness of breath.  Noted to be tachycardic with new onset Jonathan Gonzalez at PCP visit today. -metoprolol 100 mg BID -lisinopril stopped due to soft BP's -IV metop prn  -TSH wnl -echo with EF 45 to 50%, global hypokinesis -chadsvasc is at least 3, continue eliquis - cardiology c/s, appreciate assistance -Jonathan Gonzalez clinic referral (order has been placed)   HFmrEF with Mild Exacerbation - trace edema, orthopnea - volume status improved -> PO lasix (on hold with recent AKI) -  farxiga. Lisinopril on hold with above.  Plan to transition metop to succinate when time for discharge.  - will arrange cardiology f/u  Acute Kidney Injury Hold lasix, farxiga for now Trend -> resume if better tmrw  Abnormal CXR - patchy bibasilar opacities - no significant resp symptoms - repeat CXR outpatient   Hypertension - hold lisinopril with soft BP. - AV nodal blockers for rate control above   Hyperlipidemia - Continue home atorvastatin - LDL 73   Diabetes - Continue home Farxiga  (holding with AKI) - hold metformin - follow A1c (6.6)   ?Bedbugs > Patient placed on contact precautions in the ED due to staff for porting they had noticed bedbugs on the patient. - Continue precautions    DVT prophylaxis: eliquis Code Status: full Family Communication: none Disposition:   Status is: Observation The patient will require care spanning > 2 midnights and should be moved to inpatient because: continued rvr   Consultants:  none  Procedures:  Echo IMPRESSIONS     1. Left ventricular ejection fraction, by estimation, is 45 to 50%. The  left ventricle has mildly decreased function. The left ventricle  demonstrates global hypokinesis. Left ventricular diastolic function could  not be evaluated.   2. Right ventricular systolic function is normal. The right ventricular  size is normal. Tricuspid regurgitation signal is inadequate for assessing  PA pressure.   3. Left atrial size was severely dilated.   4. The mitral valve is normal in structure. Mild mitral valve  regurgitation.   5. The aortic valve is tricuspid. Aortic valve regurgitation is trivial.  Aortic valve sclerosis is present, with no evidence of aortic valve  stenosis.   6. The inferior vena cava is normal in size with greater than 50%  respiratory variability, suggesting right atrial pressure of 3 mmHg.   Antimicrobials:  Anti-infectives (From admission, onward)    None       Subjective: No complaints  Objective: Vitals:   09/18/22 0852 09/18/22 1138 09/18/22 1553 09/18/22 1718  BP: 107/76  104/79 101/70 101/66  Pulse:  100 88 (!) 115  Resp:  18 18 18   Temp:  97.8 F (36.6 C) 97.8 F (36.6 C) 98 F (36.7 C)  TempSrc:  Oral Oral Oral  SpO2: 94% 96% 96% 96%  Weight:      Height:       No intake or output data in the 24 hours ending 09/18/22 1821  Filed Weights   09/14/22 1446 09/14/22 2138 09/18/22 0404  Weight: 108.4 kg 105.1 kg 101.3 kg    Examination:  General: No acute  distress. Cardiovascular: irregularly irregular, tachycardic Lungs: Clear to auscultation bilaterally  Abdomen: Soft, nontender, nondistended  Neurological: Alert and oriented 3. Moves all extremities 4 with equal strength. Cranial nerves II through XII grossly intact. Extremities: No clubbing or cyanosis. No edema.   Data Reviewed: I have personally reviewed following labs and imaging studies  CBC: Recent Labs  Lab 09/14/22 1517 09/15/22 0143 09/16/22 0128 09/18/22 0842  WBC 9.0 9.3 8.3 10.5  NEUTROABS 6.1 6.1 5.2  --   HGB 12.7* 11.7* 11.9* 14.8  HCT 41.0 37.3* 38.9* 47.0  MCV 76.4* 75.1* 75.7* 73.9*  PLT 347 289 310 947    Basic Metabolic Panel: Recent Labs  Lab 09/14/22 1517 09/15/22 0143 09/16/22 0128 09/18/22 0842  NA 140 138 131* 135  K 4.6 3.9 3.7 3.9  CL 108 110 102 99  CO2 23 22 24 24   GLUCOSE 97 88 95 91  BUN 10 10 12  27*  CREATININE 1.14 1.00 1.23 1.50*  CALCIUM 8.9 8.7* 8.8* 9.2  MG 2.3 2.0 1.9  --   PHOS  --  2.9 3.5  --     GFR: Estimated Creatinine Clearance: 58.9 mL/min (Jonathan Gonzalez) (by C-G formula based on SCr of 1.5 mg/dL (H)).  Liver Function Tests: Recent Labs  Lab 09/15/22 0143 09/16/22 0128  AST 21 20  ALT 18 20  ALKPHOS 44 46  BILITOT 0.7 0.7  PROT 6.1* 6.1*  ALBUMIN 3.2* 3.2*    CBG: Recent Labs  Lab 09/17/22 1224 09/17/22 2355 09/18/22 0742 09/18/22 1136 09/18/22 1717  GLUCAP 99 90 85 93 98     Recent Results (from the past 240 hour(s))  Resp panel by RT-PCR (RSV, Flu Jull Harral&B, Covid) Anterior Nasal Swab     Status: None   Collection Time: 09/14/22  3:19 PM   Specimen: Anterior Nasal Swab  Result Value Ref Range Status   SARS Coronavirus 2 by RT PCR NEGATIVE NEGATIVE Final    Comment: (NOTE) SARS-CoV-2 target nucleic acids are NOT DETECTED.  The SARS-CoV-2 RNA is generally detectable in upper respiratory specimens during the acute phase of infection. The lowest concentration of SARS-CoV-2 viral copies this assay can  detect is 138 copies/mL. Jonathan Gonzalez negative result does not preclude SARS-Cov-2 infection and should not be used as the sole basis for treatment or other patient management decisions. Jonathan Gonzalez negative result may occur with  improper specimen collection/handling, submission of specimen other than nasopharyngeal swab, presence of viral mutation(s) within the areas targeted by this assay, and inadequate number of viral copies(<138 copies/mL). Jonathan Gonzalez negative result must be combined with clinical observations, patient history, and epidemiological information. The expected result is Negative.  Fact Sheet for Patients:  EntrepreneurPulse.com.au  Fact Sheet for Healthcare Providers:  IncredibleEmployment.be  This test is no t yet approved or cleared by the [place] FDA and  has been authorized for detection and/or diagnosis of SARS-CoV-2 by FDA under an Emergency Use Authorization (EUA).  This EUA will remain  in effect (meaning this test can be used) for the duration of the COVID-19 declaration under Section 564(b)(1) of the Act, 21 U.S.C.section 360bbb-3(b)(1), unless the authorization is terminated  or revoked sooner.       Influenza Jonathan Gonzalez by PCR NEGATIVE NEGATIVE Final   Influenza B by PCR NEGATIVE NEGATIVE Final    Comment: (NOTE) The Xpert Xpress SARS-CoV-2/FLU/RSV plus assay is intended as an aid in the diagnosis of influenza from Nasopharyngeal swab specimens and should not be used as Jonathan Gonzalez sole basis for treatment. Nasal washings and aspirates are unacceptable for Xpert Xpress SARS-CoV-2/FLU/RSV testing.  Fact Sheet for Patients: EntrepreneurPulse.com.au  Fact Sheet for Healthcare Providers: IncredibleEmployment.be  This test is not yet approved or cleared by the [place] FDA and has been authorized for detection and/or diagnosis of SARS-CoV-2 by FDA under an Emergency Use Authorization (EUA). This EUA will remain in  effect (meaning this test can be used) for the duration of the COVID-19 declaration under Section 564(b)(1) of the Act, 21 U.S.C. section 360bbb-3(b)(1), unless the authorization is terminated or revoked.     Resp Syncytial Virus by PCR NEGATIVE NEGATIVE Final    Comment: (NOTE) Fact Sheet for Patients: EntrepreneurPulse.com.au  Fact Sheet for Healthcare Providers: IncredibleEmployment.be  This test is not yet approved or cleared by the [place] FDA and has been authorized for detection and/or diagnosis of SARS-CoV-2 by FDA under an Emergency Use Authorization (EUA). This EUA will remain in effect (meaning this test can be used) for the duration of the COVID-19 declaration under Section 564(b)(1) of the Act, 21 U.S.C. section 360bbb-3(b)(1), unless the authorization is terminated or revoked.  Performed at [facility], [place] [address], [place], [place] [address]          Radiology Studies: No results found.  Scheduled Meds:  apixaban  5 mg Oral BID   atorvastatin  10 mg Oral Daily   dapagliflozin propanediol  5 mg Oral QAC breakfast   furosemide  40 mg Oral Daily   metoprolol tartrate  100 mg Oral BID   Continuous Infusions:     LOS: 3 days    Time spent: over 30 min    Fayrene Helper, MD [facility]   To contact the attending provider between 7A-7P or the covering provider during after hours 7P-7A, please log into the web site [email or web address] and access using universal [place] password for that web site. If you do not have the password, please call the hospital operator.  09/18/2022, 6:21 PM

## 2022-09-19 ENCOUNTER — Other Ambulatory Visit (HOSPITAL_COMMUNITY): Payer: Self-pay

## 2022-09-19 DIAGNOSIS — I4891 Unspecified atrial fibrillation: Secondary | ICD-10-CM | POA: Diagnosis not present

## 2022-09-19 LAB — GLUCOSE, CAPILLARY
Glucose-Capillary: 103 mg/dL — ABNORMAL HIGH (ref 70–99)
Glucose-Capillary: 110 mg/dL — ABNORMAL HIGH (ref 70–99)

## 2022-09-19 MED ORDER — APIXABAN 5 MG PO TABS
5.0000 mg | ORAL_TABLET | Freq: Two times a day (BID) | ORAL | 0 refills | Status: DC
  Filled 2022-09-19: qty 60, 30d supply, fill #0

## 2022-09-19 MED ORDER — METOPROLOL TARTRATE 100 MG PO TABS
100.0000 mg | ORAL_TABLET | Freq: Two times a day (BID) | ORAL | 0 refills | Status: DC
  Filled 2022-09-19: qty 60, 30d supply, fill #0

## 2022-09-19 NOTE — Progress Notes (Addendum)
Rounding Note    Patient Name: Jonathan Gonzalez Date of Encounter: 09/19/2022  [place] Cardiologist: Candee Furbish, MD   Subjective   Patient resting comfortably in bed. He denies chest pain, palpitations, shortness of breath this morning and is curious about the timing of discharge.  Inpatient Medications    Scheduled Meds:  apixaban  5 mg Oral BID   atorvastatin  10 mg Oral Daily   metoprolol tartrate  100 mg Oral BID   Continuous Infusions:  PRN Meds: acetaminophen, metoprolol tartrate, ondansetron (ZOFRAN) IV   Vital Signs    Vitals:   09/18/22 2032 09/19/22 0022 09/19/22 0420 09/19/22 0819  BP: 103/76 103/74 119/66 120/85  Pulse:  85 78 82  Resp: 19 18 19 18   Temp: 98.8 F (37.1 C) 98.8 F (37.1 C) (!) 97.4 F (36.3 C) 97.7 F (36.5 C)  TempSrc: Oral Oral Oral Oral  SpO2: 96% 95% 96% 96%  Weight:   100.7 kg   Height:       No intake or output data in the 24 hours ending 09/19/22 0850    09/19/2022    4:20 AM 09/18/2022    4:04 AM 09/14/2022    9:38 PM  Last 3 Weights  Weight (lbs) 222 lb 0.1 oz 223 lb 5.2 oz 231 lb 12.8 oz  Weight (kg) 100.7 kg 101.3 kg 105.144 kg      Telemetry    Atrial fibrillation. Average ventricular rates in the low 100s.   ECG    No new tracing  Physical Exam   GEN: No acute distress.   Neck: No JVD Cardiac: Irregularly irregular, no murmurs, rubs, or gallops.  Respiratory: Clear to auscultation bilaterally. GI: Soft, nontender, non-distended  MS: No edema; No deformity. Neuro:  Nonfocal  Psych: Normal affect   Labs    High Sensitivity Troponin:  No results for input(s): "TROPONINIHS" in the last 720 hours.   Chemistry Recent Labs  Lab 09/14/22 1517 09/15/22 0143 09/16/22 0128 09/18/22 0842  NA 140 138 131* 135  K 4.6 3.9 3.7 3.9  CL 108 110 102 99  CO2 23 22 24 24   GLUCOSE 97 88 95 91  BUN 10 10 12  27*  CREATININE 1.14 1.00 1.23 1.50*  CALCIUM 8.9 8.7* 8.8* 9.2  MG 2.3 2.0 1.9  --   PROT   --  6.1* 6.1*  --   ALBUMIN  --  3.2* 3.2*  --   AST  --  21 20  --   ALT  --  18 20  --   ALKPHOS  --  44 46  --   BILITOT  --  0.7 0.7  --   GFRNONAA >60 >60 >60 52*  ANIONGAP 9 6 5 12     Lipids  Recent Labs  Lab 09/15/22 0143  CHOL 123  TRIG 59  HDL 38*  LDLCALC 73  CHOLHDL 3.2    Hematology Recent Labs  Lab 09/15/22 0143 09/16/22 0128 09/18/22 0842  WBC 9.3 8.3 10.5  RBC 4.97 5.14 6.36*  HGB 11.7* 11.9* 14.8  HCT 37.3* 38.9* 47.0  MCV 75.1* 75.7* 73.9*  MCH 23.5* 23.2* 23.3*  MCHC 31.4 30.6 31.5  RDW 17.1* 17.0* 17.7*  PLT 289 310 388   Thyroid  Recent Labs  Lab 09/16/22 0128  TSH 2.367    BNP Recent Labs  Lab 09/14/22 1517  BNP 363.3*    DDimer No results for input(s): "DDIMER" in the last 168 hours.  Radiology    No results found.  Cardiac Studies   09/15/22 TTE  IMPRESSIONS     1. Left ventricular ejection fraction, by estimation, is 45 to 50%. The  left ventricle has mildly decreased function. The left ventricle  demonstrates global hypokinesis. Left ventricular diastolic function could  not be evaluated.   2. Right ventricular systolic function is normal. The right ventricular  size is normal. Tricuspid regurgitation signal is inadequate for assessing  PA pressure.   3. Left atrial size was severely dilated.   4. The mitral valve is normal in structure. Mild mitral valve  regurgitation.   5. The aortic valve is tricuspid. Aortic valve regurgitation is trivial.  Aortic valve sclerosis is present, with no evidence of aortic valve  stenosis.   6. The inferior vena cava is normal in size with greater than 50%  respiratory variability, suggesting right atrial pressure of 3 mmHg.   FINDINGS   Left Ventricle: Left ventricular ejection fraction, by estimation, is 45  to 50%. The left ventricle has mildly decreased function. The left  ventricle demonstrates global hypokinesis. The left ventricular internal  cavity size was normal in  size. There is   no left ventricular hypertrophy. Left ventricular diastolic function  could not be evaluated due to atrial fibrillation. Left ventricular  diastolic function could not be evaluated.   Right Ventricle: The right ventricular size is normal. No increase in  right ventricular wall thickness. Right ventricular systolic function is  normal. Tricuspid regurgitation signal is inadequate for assessing PA  pressure.   Left Atrium: Left atrial size was severely dilated.   Right Atrium: Right atrial size was normal in size.   Pericardium: There is no evidence of pericardial effusion.   Mitral Valve: The mitral valve is normal in structure. Mild mitral valve  regurgitation.   Tricuspid Valve: The tricuspid valve is normal in structure. Tricuspid  valve regurgitation is trivial.   Aortic Valve: The aortic valve is tricuspid. Aortic valve regurgitation is  trivial. Aortic valve sclerosis is present, with no evidence of aortic  valve stenosis.   Pulmonic Valve: The pulmonic valve was normal in structure. Pulmonic valve  regurgitation is not visualized.   Aorta: The aortic root and ascending aorta are structurally normal, with  no evidence of dilitation.   Venous: The inferior vena cava is normal in size with greater than 50%  respiratory variability, suggesting right atrial pressure of 3 mmHg.   IAS/Shunts: No atrial level shunt detected by color flow Doppler.   Patient Profile     DIJUAN SLEETH is a 62 y.o. male with a hx of diabetes, HTN, HLD who is being seen 09/18/2022 for the evaluation of atrial fibrillation at the request of Dr. Florene Glen.   Assessment & Plan    Atrial Fibrillation, newly diagnosed   Secondary hypercoagulable state CHA2DS2-VASc Score = 3    Patient admitted in the setting of new onset afib (found at PCP on 12/29). Initially managed with diltiazem, stopped after EF found reduced to 45-50% on TTE. Now on Metoprolol with improved rates.   Patient  feeling better this morning. Continue Metoprolol 100mg  BID Continue Eliquis 5mg  BID. Reinforce compliance with patient Will arrange afib clinic follow up for consideration of cardioversion if he remains in afib. Less likely to spontaneously convert with dilated LA.  Acute HFmrEF (new)  TTE this admission with LVEF 45-50%. Severely dilated LA.  Patient received IV lasix x2 with trace peripheral edema and orthopnea. Now appears euvolemic.  Creatinine elevated to 1.50 this morning.  Reduced EF suspected to be afib induced. Plan to reassess following restoration of NSR. If EF remains reduced, he would warrant ischemic evaluation with multiple CAD risk factors.  Continue metoprolol 100mg  BID Continue Wilder Glade  Would hold diuretic at discharge given increased creatinine. Patient may require PRN Lasix for edema/orthopnea but unlikely to need daily dose.  HTN  BP stable with metoprolol. Continue at discharge.  HLD  Lipid panel 12/30 showed LDL 73, HDL 38, triglycerides 59  Continue lipitor 10 mg daily   Mild Mitral Valve Regurgitation  Noted on TTE this admission. Continue to monitor.       For questions or updates, please contact [place] Please consult [email or web address] for contact info under        Signed, Ysidro Ramsay, PA-C  09/19/2022, 8:50 AM    Pleasant, asking when he can go home.  No significant shortness of breath or chest pain.  Irregularly irregular, heart rate minimally tachycardic with activity.  Telemetry shows heart rate less than 100 during sleep.  Atrial fibrillation paroxysmal with rapid ventricular response - I am fine with his discharge home.  He may continue with metoprolol tartrate 100 mg twice a day.  Also continue with Eliquis 5 mg twice a day. -We will set up atrial fibrillation clinic follow-up in 2 weeks.  If he is still in atrial fibrillation, we will set him up for cardioversion.  He knows that he cannot miss any doses of Eliquis.  Cardioversion  can take place 3 weeks after Eliquis administration.  Mildly reduced ejection fraction 45% - Hopefully this is secondary to atrial fibrillation with RVR.  As rhythm is restored and heart rate is improved, EF should improve.  If this does not improve, could consider further ischemic evaluation in the future.  Hyperlipidemia - Continue with atorvastatin 10 mg once a day.  OK with DC Candee Furbish, MD

## 2022-09-19 NOTE — Discharge Summary (Signed)
Physician Discharge Summary   Patient: Jonathan Gonzalez MRN: [id number] DOB: May 26, 1961  Admit date:     09/14/2022  Discharge date: 09/19/22  Discharge Physician: Patrecia Pour   PCP: Jonathan Pounds, NP   Recommendations at discharge:  Follow up with atrial fibrillation clinic 10/08/2021 at 1:30pm. Discharged on eliquis and metoprolol for new atrial fibrillation. Follow up with PCP in 1-2 weeks with recheck BMP and continued diabetes management.  Discharge Diagnoses: Principal Problem:   Atrial fibrillation with RVR (Jonathan Gonzalez) Active Problems:   DM (diabetes mellitus) (Jonathan Gonzalez)   HTN (hypertension)   HLD (hyperlipidemia)  Hospital Course: Jonathan Gonzalez is a 62 y.o. male with medical history significant of diabetes, hypertension, hyperlipidemia presenting with tachycardia and new A-fib rhythm.   Patient was at his PCP office today and noted to be tachycardic and EKG was performed which showed new onset A-fib.  Patient reports mild intermittent shortness of breath but otherwise has been asymptomatic and has no symptoms at rest.  He states he is not sure how long it has been going on.  Assessment and Plan: Atrial fibrillation with RVR -New onset A-fib with RVR.  Unsure how long it has been going because he only has minimal symptoms of intermittent shortness of breath.  Noted to be tachycardic with new onset A-fib at PCP visit today. 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 Appears euvolemic after IV lasix x2. Consider ischemic evaluation if echo persistently low EF despite improved rate/rhythm control. Cr rising so no ongoing diuretic recommended by cardiology. - will arrange cardiology f/u   Acute Kidney Injury - Ok to restart farxiga and    Abnormal CXR - patchy bibasilar opacities - no significant resp symptoms - repeat CXR outpatient     Hypertension - hold lisinopril with soft BP. - AV nodal blockers for rate control above   Hyperlipidemia - Continue home atorvastatin - LDL 73   T2DM - Continue home meds   ?Bedbugs > Patient placed on contact precautions in the ED due to staff for porting they had noticed bedbugs on the patient.  Consultants: Cardiology Procedures performed: Echo  Disposition: Home Diet recommendation:  Cardiac and Carb modified diet DISCHARGE MEDICATION: Allergies as of 09/19/2022   No Known Allergies      Medication List     TAKE these medications    apixaban 5 MG Tabs tablet Commonly known as: ELIQUIS Take 1 tablet (5 mg total) by mouth 2 (two) times daily.   atorvastatin 10 MG tablet Commonly known as: LIPITOR Take 1 tablet (10 mg total) by mouth daily. For Cholesterol and diabetes   Farxiga 5 MG Tabs tablet Generic drug: dapagliflozin propanediol Take 1 tablet (5 mg total) by mouth daily before breakfast.   hydrOXYzine 25 MG capsule Commonly known as: VISTARIL Take 1-2 capsules (25-50 mg total) by mouth at bedtime. For sleep   lisinopril 20 MG tablet Commonly known as: ZESTRIL TAKE 1 TABLET BY MOUTH ONCE A DAY   metFORMIN 500 MG tablet Commonly known as: GLUCOPHAGE Take 1 tablet (500 mg total) by mouth 2 (two) times daily with a meal.   metoprolol tartrate 100 MG tablet Commonly known as: LOPRESSOR Take 1 tablet (100 mg total) by mouth 2 (two) times daily.   omeprazole 20 MG capsule Commonly known as: PRILOSEC Take 1 capsule (20 mg total) by mouth daily.  FOR ACID REFLUX   True Metrix Blood Glucose Test test strip Generic drug: glucose blood Use as instructed. Check blood glucose level by fingerstick twice per day. NEEDS PASS   True Metrix Meter w/Device Kit Use as instructed. Check blood glucose level by fingerstick twice per day. E11.65        Follow-up Information     Jonathan Pounds, NP Follow up.   Specialty: Nurse Practitioner Contact  information: [place] [address] [phone]         [facility] Follow up.   Specialty: Cardiology Contact information: [address] [id number] [place] [phone]               Discharge Exam: Filed Weights   09/14/22 2138 09/18/22 0404 09/19/22 0420  Weight: 105.1 kg 101.3 kg 100.7 kg  BP (!) 112/92 (BP Location: Left Arm)   Pulse 94   Temp 97.8 F (36.6 C) (Oral)   Resp 18   Ht _0  (1.727 m)   Wt 100.7 kg   SpO2 97%   BMI 33.76 kg/m   Nonlabored, clear, no distress, Irreg irreg.   Condition at discharge: stable  The results of significant diagnostics from this hospitalization (including imaging, microbiology, ancillary and laboratory) are listed below for reference.   Imaging Studies: ECHOCARDIOGRAM COMPLETE  Result Date: 09/15/2022    ECHOCARDIOGRAM REPORT   Patient Name:   Jonathan Gonzalez Date of Exam: 09/15/2022 Medical Rec #:  [id number]        Height:       68.0 in Accession #:    [id number]       Weight:       231.8 lb Date of Birth:  1961/01/16        BSA:          2.176 m Patient Age:    62 years         BP:           127/74 mmHg Patient Gender: M                HR:           118 bpm. Exam Location:  Inpatient Procedure: 2D Echo, Cardiac Doppler and Color Doppler Indications:    Atrial Fibrillation  History:        Patient has no prior history of Echocardiogram examinations.                 Arrythmias:Tachycardia and Atrial Fibrillation,                 Signs/Symptoms:Shortness of Breath; Risk Factors:Diabetes,                 Hypertension and Dyslipidemia.  Sonographer:    Eartha Inch Referring Phys: Neva Seat, B IMPRESSIONS  1. Left ventricular ejection fraction, by estimation, is 45 to 50%. The left ventricle has mildly decreased function. The left ventricle demonstrates global hypokinesis. Left ventricular diastolic function could not be evaluated.  2.  Right ventricular systolic function is normal. The right ventricular size is normal. Tricuspid regurgitation signal is inadequate for assessing PA pressure.  3. Left atrial size was severely dilated.  4. The mitral valve is normal in structure. Mild mitral valve regurgitation.  5. The aortic valve is tricuspid. Aortic valve regurgitation is trivial. Aortic valve sclerosis is present, with no evidence of aortic valve stenosis.  6. The inferior vena cava is normal  in size with greater than 50% respiratory variability, suggesting right atrial pressure of 3 mmHg. FINDINGS  Left Ventricle: Left ventricular ejection fraction, by estimation, is 45 to 50%. The left ventricle has mildly decreased function. The left ventricle demonstrates global hypokinesis. The left ventricular internal cavity size was normal in size. There is  no left ventricular hypertrophy. Left ventricular diastolic function could not be evaluated due to atrial fibrillation. Left ventricular diastolic function could not be evaluated. Right Ventricle: The right ventricular size is normal. No increase in right ventricular wall thickness. Right ventricular systolic function is normal. Tricuspid regurgitation signal is inadequate for assessing PA pressure. Left Atrium: Left atrial size was severely dilated. Right Atrium: Right atrial size was normal in size. Pericardium: There is no evidence of pericardial effusion. Mitral Valve: The mitral valve is normal in structure. Mild mitral valve regurgitation. Tricuspid Valve: The tricuspid valve is normal in structure. Tricuspid valve regurgitation is trivial. Aortic Valve: The aortic valve is tricuspid. Aortic valve regurgitation is trivial. Aortic valve sclerosis is present, with no evidence of aortic valve stenosis. Pulmonic Valve: The pulmonic valve was normal in structure. Pulmonic valve regurgitation is not visualized. Aorta: The aortic root and ascending aorta are structurally normal, with no evidence of  dilitation. Venous: The inferior vena cava is normal in size with greater than 50% respiratory variability, suggesting right atrial pressure of 3 mmHg. IAS/Shunts: No atrial level shunt detected by color flow Doppler. Sanda Klein MD Electronically signed by Sanda Klein MD Signature Date/Time: 09/15/2022/2:31:35 PM    Final    DG Chest Port 1 View  Result Date: 09/14/2022 CLINICAL DATA:  Shortness of breath EXAM: PORTABLE CHEST 1 VIEW COMPARISON:  Chest radiograph dated 10/16/2007 FINDINGS: Low lung volumes. Bibasilar patchy opacities no pleural effusion or pneumothorax. The heart size and mediastinal contours are within normal limits. The visualized skeletal structures are unremarkable. IMPRESSION: Low lung volumes with bibasilar patchy opacities, likely atelectasis. Electronically Signed   By: Darrin Nipper M.D.   On: 09/14/2022 16:03    Microbiology: Results for orders placed or performed during the hospital encounter of 09/14/22  Resp panel by RT-PCR (RSV, Flu A&B, Covid) Anterior Nasal Swab     Status: None   Collection Time: 09/14/22  3:19 PM   Specimen: Anterior Nasal Swab  Result Value Ref Range Status   SARS Coronavirus 2 by RT PCR NEGATIVE NEGATIVE Final    Comment: (NOTE) SARS-CoV-2 target nucleic acids are NOT DETECTED.  The SARS-CoV-2 RNA is generally detectable in upper respiratory specimens during the acute phase of infection. The lowest concentration of SARS-CoV-2 viral copies this assay can detect is 138 copies/mL. A negative result does not preclude SARS-Cov-2 infection and should not be used as the sole basis for treatment or other patient management decisions. A negative result may occur with  improper specimen collection/handling, submission of specimen other than nasopharyngeal swab, presence of viral mutation(s) within the areas targeted by this assay, and inadequate number of viral copies(<138 copies/mL). A negative result must be combined with clinical  observations, patient history, and epidemiological information. The expected result is Negative.  Fact Sheet for Patients:  EntrepreneurPulse.com.au  Fact Sheet for Healthcare Providers:  IncredibleEmployment.be  This test is no t yet approved or cleared by the [place] FDA and  has been authorized for detection and/or diagnosis of SARS-CoV-2 by FDA under an Emergency Use Authorization (EUA). This EUA will remain  in effect (meaning this test can be used) for the duration of the  COVID-19 declaration under Section 564(b)(1) of the Act, 21 U.S.C.section 360bbb-3(b)(1), unless the authorization is terminated  or revoked sooner.       Influenza A by PCR NEGATIVE NEGATIVE Final   Influenza B by PCR NEGATIVE NEGATIVE Final    Comment: (NOTE) The Xpert Xpress SARS-CoV-2/FLU/RSV plus assay is intended as an aid in the diagnosis of influenza from Nasopharyngeal swab specimens and should not be used as a sole basis for treatment. Nasal washings and aspirates are unacceptable for Xpert Xpress SARS-CoV-2/FLU/RSV testing.  Fact Sheet for Patients: EntrepreneurPulse.com.au  Fact Sheet for Healthcare Providers: IncredibleEmployment.be  This test is not yet approved or cleared by the [place] FDA and has been authorized for detection and/or diagnosis of SARS-CoV-2 by FDA under an Emergency Use Authorization (EUA). This EUA will remain in effect (meaning this test can be used) for the duration of the COVID-19 declaration under Section 564(b)(1) of the Act, 21 U.S.C. section 360bbb-3(b)(1), unless the authorization is terminated or revoked.     Resp Syncytial Virus by PCR NEGATIVE NEGATIVE Final    Comment: (NOTE) Fact Sheet for Patients: EntrepreneurPulse.com.au  Fact Sheet for Healthcare Providers: IncredibleEmployment.be  This test is not yet approved or cleared by  the [place] FDA and has been authorized for detection and/or diagnosis of SARS-CoV-2 by FDA under an Emergency Use Authorization (EUA). This EUA will remain in effect (meaning this test can be used) for the duration of the COVID-19 declaration under Section 564(b)(1) of the Act, 21 U.S.C. section 360bbb-3(b)(1), unless the authorization is terminated or revoked.  Performed at [facility], [place] [address], [place], [place] [address]     Labs: CBC: Recent Labs  Lab 09/14/22 1517 09/15/22 0143 09/16/22 0128 09/18/22 0842  WBC 9.0 9.3 8.3 10.5  NEUTROABS 6.1 6.1 5.2  --   HGB 12.7* 11.7* 11.9* 14.8  HCT 41.0 37.3* 38.9* 47.0  MCV 76.4* 75.1* 75.7* 73.9*  PLT 347 289 310 601   Basic Metabolic Panel: Recent Labs  Lab 09/14/22 1517 09/15/22 0143 09/16/22 0128 09/18/22 0842  NA 140 138 131* 135  K 4.6 3.9 3.7 3.9  CL 108 110 102 99  CO2 _0 GLUCOSE 97 88 95 91  BUN _1 27*  CREATININE 1.14 1.00 1.23 1.50*  CALCIUM 8.9 8.7* 8.8* 9.2  MG 2.3 2.0 1.9  --   PHOS  --  2.9 3.5  --    Liver Function Tests: Recent Labs  Lab 09/15/22 0143 09/16/22 0128  AST 21 20  ALT 18 20  ALKPHOS 44 46  BILITOT 0.7 0.7  PROT 6.1* 6.1*  ALBUMIN 3.2* 3.2*   CBG: Recent Labs  Lab 09/18/22 0742 09/18/22 1136 09/18/22 1717 09/18/22 2148 09/19/22 0816  GLUCAP 85 93 98 130* 103*    Discharge time spent: greater than 30 minutes.  Signed: Patrecia Pour, MD [facility] 09/19/2022

## 2022-09-19 NOTE — Plan of Care (Signed)

## 2022-09-19 NOTE — Progress Notes (Addendum)
CSW received consult patient needs assistance with transportation. Patient signed rider waiver.CSW placed signed rider waiver in patients hard chart. Patient requested clothes from CSW.  CSW returned to patients room with clothes requested by patient from social work clothes closet patient informed CSW that he spoke with his father-n-law and patients father-n-law will be able to pick him up when medically ready for dc.Patient accepted clothes from social work Management consultant. CSW informed RN . All questions answered. No further questions reported at this time.

## 2022-09-20 ENCOUNTER — Telehealth: Payer: Self-pay

## 2022-09-20 NOTE — Telephone Encounter (Signed)
Transition Care Management Follow-up Telephone Call Date of discharge and from where: 09/19/2022, [facility] How have you been since you were released from the hospital? He state he is doing fine. Any questions or concerns? No  Items Reviewed: Did the pt receive and understand the discharge instructions provided? Yes  Medications obtained and verified? Yes - he said he has all of his medications as well as a working glucometer and he did not have any questions about the med regime  Other? No  Any new allergies since your discharge? No  Dietary orders reviewed? Yes Do you have support at home? Yes , his son.   Home Care and Equipment/Supplies: Were home health services ordered? no If so, what is the name of the agency? N/a  Has the agency set up a time to come to the patient's home? not applicable Were any new equipment or medical supplies ordered?  No What is the name of the medical supply agency? N/a Were you able to get the supplies/equipment? not applicable Do you have any questions related to the use of the equipment or supplies? No  Functional Questionnaire: (I = Independent and D = Dependent) ADLs: independent  Follow up appointments reviewed:  PCP Hospital f/u appt confirmed?  He is not scheduled to see Ms Raul Del, NP until 12/14/2022 and he did not want the appointment rescheduled to be seen sooner,   He said he knows what to do if he feels sick  [facility] f/u appt confirmed? Yes  Scheduled to see Afib clinic - 10/08/2022.   Are transportation arrangements needed? No  If their condition worsens, is the pt aware to call PCP or go to the Emergency Dept.? Yes Was the patient provided with contact information for the PCP's office or ED? Yes Was to pt encouraged to call back with questions or concerns? Yes

## 2022-09-21 ENCOUNTER — Ambulatory Visit: Payer: Self-pay

## 2022-09-21 NOTE — Telephone Encounter (Signed)
Pt states he got out of hospital this past week.  He works in [employer] and lives in [place].  Pt wants to know if he gets sick at work, what should he do or tell the people at work, since he is now on a blood thinners.   Chief Complaint: Pt. States he will let employer know he is on a blood thinner "in case I get sick at work." Symptoms: No Frequency: n/a Pertinent Negatives: Patient denies  Disposition: [] ED /[] Urgent Care (no appt availability in office) / [] Appointment(In office/virtual)/ []  [facility]/ [] Home Care/ [] Refused Recommended Disposition /[] [facility]/ []  Follow-up with PCP Additional Notes:   Answer Assessment - Initial Assessment Questions 1. NAME of MEDICINE: "What medicine(s) are you calling about?"     Eliquis 2. QUESTION: "What is your question?" (e.g., double dose of medicine, side effect)     Is going to let his employer know he is on a blood thinner 3. PRESCRIBER: "Who prescribed the medicine?" Reason: if prescribed by specialist, call should be referred to that group.     Hospital started pt. On medication. 4. SYMPTOMS: "Do you have any symptoms?" If Yes, ask: "What symptoms are you having?"  "How bad are the symptoms (e.g., mild, moderate, severe)     no 5. PREGNANCY:  "Is there any chance that you are pregnant?" "When was your last menstrual period?"     N/a  Protocols used: Medication Question Call-A-AH

## 2022-09-28 ENCOUNTER — Other Ambulatory Visit: Payer: Self-pay

## 2022-09-28 ENCOUNTER — Other Ambulatory Visit: Payer: Self-pay | Admitting: Nurse Practitioner

## 2022-09-28 DIAGNOSIS — I10 Essential (primary) hypertension: Secondary | ICD-10-CM

## 2022-09-28 DIAGNOSIS — E1165 Type 2 diabetes mellitus with hyperglycemia: Secondary | ICD-10-CM

## 2022-09-28 MED ORDER — LISINOPRIL 20 MG PO TABS
20.0000 mg | ORAL_TABLET | Freq: Every day | ORAL | 1 refills | Status: DC
  Filled 2022-09-28: qty 30, 30d supply, fill #0
  Filled 2022-11-09: qty 30, 30d supply, fill #1

## 2022-09-28 MED ORDER — DAPAGLIFLOZIN PROPANEDIOL 5 MG PO TABS
5.0000 mg | ORAL_TABLET | Freq: Every day | ORAL | 1 refills | Status: DC
  Filled 2022-09-28: qty 30, 30d supply, fill #0
  Filled 2022-11-09: qty 30, 30d supply, fill #1

## 2022-09-28 NOTE — Telephone Encounter (Signed)
Copied from [place] [id number]. Topic: General - Other >> Sep 28, 2022 11:36 AM Everette C wrote: Reason for CRM: Medication Refill - Medication: Rx #: [id number]  lisinopril (ZESTRIL) 20 MG tablet [[id number]]   Rx #: [id number]  dapagliflozin propanediol (FARXIGA) 5 MG TABS tablet [[id number]]   Has the patient contacted their pharmacy? No. (Agent: If no, request that the patient contact the pharmacy for the refill. If patient does not wish to contact the pharmacy document the reason why and proceed with request.) (Agent: If yes, when and what did the pharmacy advise?)  Preferred Pharmacy (with phone number or street name): [facility] [address], [place] [address] Phone: [phone] Fax: [phone] Hours: M-F 7:30a-6:00p  Has the patient been seen for an appointment in the last year OR does the patient have an upcoming appointment? Yes.    Agent: Please be advised that RX refills may take up to 3 business days. We ask that you follow-up with your pharmacy.

## 2022-09-28 NOTE — Telephone Encounter (Signed)
prescription for

## 2022-10-01 ENCOUNTER — Other Ambulatory Visit: Payer: Self-pay

## 2022-10-02 ENCOUNTER — Other Ambulatory Visit: Payer: Self-pay

## 2022-10-03 ENCOUNTER — Other Ambulatory Visit: Payer: Self-pay

## 2022-10-04 ENCOUNTER — Other Ambulatory Visit: Payer: Self-pay

## 2022-10-08 ENCOUNTER — Encounter (HOSPITAL_COMMUNITY): Payer: Self-pay | Admitting: Physician Assistant

## 2022-10-08 ENCOUNTER — Other Ambulatory Visit: Payer: Self-pay

## 2022-10-08 ENCOUNTER — Ambulatory Visit (HOSPITAL_COMMUNITY)
Admission: RE | Admit: 2022-10-08 | Discharge: 2022-10-08 | Disposition: A | Discharge status: Home / Self Care | Payer: Self-pay | Source: Ambulatory Visit | Attending: Physician Assistant | Admitting: Physician Assistant

## 2022-10-08 VITALS — BP 136/80 | HR 111 | Ht 68.0 in | Wt 237.2 lb

## 2022-10-08 DIAGNOSIS — Z6836 Body mass index (BMI) 36.0-36.9, adult: Secondary | ICD-10-CM | POA: Insufficient documentation

## 2022-10-08 DIAGNOSIS — I110 Hypertensive heart disease with heart failure: Secondary | ICD-10-CM | POA: Insufficient documentation

## 2022-10-08 DIAGNOSIS — I4819 Other persistent atrial fibrillation: Secondary | ICD-10-CM | POA: Insufficient documentation

## 2022-10-08 DIAGNOSIS — Z79899 Other long term (current) drug therapy: Secondary | ICD-10-CM | POA: Insufficient documentation

## 2022-10-08 DIAGNOSIS — Z7901 Long term (current) use of anticoagulants: Secondary | ICD-10-CM | POA: Insufficient documentation

## 2022-10-08 DIAGNOSIS — Z7984 Long term (current) use of oral hypoglycemic drugs: Secondary | ICD-10-CM | POA: Insufficient documentation

## 2022-10-08 DIAGNOSIS — R0683 Snoring: Secondary | ICD-10-CM | POA: Insufficient documentation

## 2022-10-08 DIAGNOSIS — E669 Obesity, unspecified: Secondary | ICD-10-CM | POA: Insufficient documentation

## 2022-10-08 DIAGNOSIS — D6869 Other thrombophilia: Secondary | ICD-10-CM | POA: Insufficient documentation

## 2022-10-08 DIAGNOSIS — E119 Type 2 diabetes mellitus without complications: Secondary | ICD-10-CM | POA: Insufficient documentation

## 2022-10-08 DIAGNOSIS — E785 Hyperlipidemia, unspecified: Secondary | ICD-10-CM | POA: Insufficient documentation

## 2022-10-08 DIAGNOSIS — I5020 Unspecified systolic (congestive) heart failure: Secondary | ICD-10-CM | POA: Insufficient documentation

## 2022-10-08 NOTE — Progress Notes (Signed)
Primary Care Physician: Gildardo Pounds, NP Primary Cardiologist: Dr Marlou Porch Primary Electrophysiologist: none Referring Physician: Dr Madilyn Fireman is a 62 y.o. male with a history of DM, HTN, HLD, atrial fibrillation who presents for consultation in the [facility]. Patient was seen at the [facility] on 12/29, and was found to be tachycardic with HR in the 120s. Was sent to the ED, where EKG showed atrial fibrillation with HR 150 BPM. He reported having intermittent SOB, but otherwise was asymptomatic. Patient was started on IV diltiazem. Echocardiogram on 09/15/22 showed EF 45-50%, normal RV systolic function, severely dilated LA. IV diltiazem was stopped, and patient was started on PO metoprolol. Patient was set for discharged on 12/31, and outpatient cardiology follow up has been arranged. However, HR became elevated, so patient was kept for improved rate control. Cardiology asked to consult. He was started on Eliquis for a CHADS2VASC score of 3 with plan for outpatient DCCV. Today, patient remains in persistent afib with symptoms of fatigue on exertion. He denies any missed doses of anticoagulation. He does admit to snoring and daytime somnolence.   Today, he denies symptoms of palpitations, chest pain, orthopnea, PND, lower extremity edema, dizziness, presyncope, syncope, bleeding, or neurologic sequela. The patient is tolerating medications without difficulties and is otherwise without complaint today.    Atrial Fibrillation Risk Factors:  he does have symptoms or diagnosis of sleep apnea. he does not have a history of rheumatic fever. he does have a history of alcohol use, quit 15 years ago. The patient does not have a history of early familial atrial fibrillation or other arrhythmias.  he has a BMI of Body mass index is 36.07 kg/m.Marland Kitchen Filed Weights   10/08/22 1316  Weight: 107.6 kg    No family history  on file.   Atrial Fibrillation Management history:  Previous antiarrhythmic drugs: none Previous cardioversions: none Previous ablations: none CHADS2VASC score: 3 Anticoagulation history: Eliquis   Past Medical History:  Diagnosis Date   Diabetes mellitus without complication ([place])    Hyperlipidemia    Hypertension    Past Surgical History:  Procedure Laterality Date   HERNIA REPAIR      Current Outpatient Medications  Medication Sig Dispense Refill   apixaban (ELIQUIS) 5 MG TABS tablet Take 1 tablet (5 mg total) by mouth 2 (two) times daily. 60 tablet 0   atorvastatin (LIPITOR) 10 MG tablet Take 1 tablet (10 mg total) by mouth daily. For Cholesterol and diabetes 90 tablet 3   Blood Glucose Monitoring Suppl (TRUE METRIX METER) w/Device KIT Use as instructed. Check blood glucose level by fingerstick twice per day. E11.65 1 kit 0   dapagliflozin propanediol (FARXIGA) 5 MG TABS tablet Take 1 tablet (5 mg total) by mouth daily before breakfast. 90 tablet 1   glucose blood (TRUE METRIX BLOOD GLUCOSE TEST) test strip Use as instructed. Check blood glucose level by fingerstick twice per day. NEEDS PASS 100 each 12   hydrOXYzine (VISTARIL) 25 MG capsule Take 1-2 capsules (25-50 mg total) by mouth at bedtime. For sleep 60 capsule 1   lisinopril (ZESTRIL) 20 MG tablet TAKE 1 TABLET BY MOUTH ONCE A DAY 90 tablet 1   metFORMIN (GLUCOPHAGE) 500 MG tablet Take 1 tablet (500 mg total) by mouth 2 (two) times daily with a meal. 60 tablet 2   metoprolol tartrate (LOPRESSOR) 100 MG tablet Take 1 tablet (100 mg total) by mouth 2 (  two) times daily. 60 tablet 0   omeprazole (PRILOSEC) 20 MG capsule Take 1 capsule (20 mg total) by mouth daily. FOR ACID REFLUX 30 capsule 3   No current facility-administered medications for this encounter.    No Known Allergies  Social History   Socioeconomic History   Marital status: Married    Spouse name: Not on file   Number of children: Not on file   Years  of education: Not on file   Highest education level: Not on file  Occupational History   Not on file  Tobacco Use   Smoking status: Never   Smokeless tobacco: Never   Tobacco comments:    Never smoke 10/08/22  Vaping Use   Vaping Use: Never used  Substance and Sexual Activity   Alcohol use: Never   Drug use: Never   Sexual activity: Not Currently  Other Topics Concern   Not on file  Social History Narrative   Not on file   Social Determinants of Health   Financial Resource Strain: Not on file  Food Insecurity: Not on file  Transportation Needs: Not on file  Physical Activity: Not on file  Stress: Not on file  Social Connections: Not on file  Intimate Partner Violence: Not on file     ROS- All systems are reviewed and negative except as per the HPI above.  Physical Exam: Vitals:   10/08/22 1316  BP: 136/80  Pulse: (!) 111  Weight: 107.6 kg  Height: 5\' 8"  (1.727 m)    GEN- The patient is a well appearing obese male, alert and oriented x 3 today.   Head- normocephalic, atraumatic Eyes-  Sclera clear, conjunctiva pink Ears- hearing intact Oropharynx- clear Neck- supple  Lungs- Clear to ausculation bilaterally, normal work of breathing Heart- irregular rate and rhythm, no murmurs, rubs or gallops  GI- soft, NT, ND, + BS Extremities- no clubbing, cyanosis, or edema MS- no significant deformity or atrophy Skin- no rash or lesion Psych- euthymic mood, full affect Neuro- strength and sensation are intact  Wt Readings from Last 3 Encounters:  10/08/22 107.6 kg  09/19/22 100.7 kg  09/14/22 108.1 kg    EKG today demonstrates  Afib Vent. rate 111 BPM PR interval * ms QRS duration 66 ms QT/QTcB 336/456 ms  Echo 09/15/22 demonstrated  1. Left ventricular ejection fraction, by estimation, is 45 to 50%. The  left ventricle has mildly decreased function. The left ventricle  demonstrates global hypokinesis. Left ventricular diastolic function could  not be  evaluated.   2. Right ventricular systolic function is normal. The right ventricular  size is normal. Tricuspid regurgitation signal is inadequate for assessing  PA pressure.   3. Left atrial size was severely dilated.   4. The mitral valve is normal in structure. Mild mitral valve  regurgitation.   5. The aortic valve is tricuspid. Aortic valve regurgitation is trivial.  Aortic valve sclerosis is present, with no evidence of aortic valve  stenosis.   6. The inferior vena cava is normal in size with greater than 50%  respiratory variability, suggesting right atrial pressure of 3 mmHg.   Epic records are reviewed at length today  CHA2DS2-VASc Score = 3  The patient's score is based upon: CHF History: 1 HTN History: 1 Diabetes History: 1 Stroke History: 0 Vascular Disease History: 0 Age Score: 0 Gender Score: 0       ASSESSMENT AND PLAN: 1. Persistent Atrial Fibrillation (ICD10:  I48.19) The patient's CHA2DS2-VASc score is 3,  indicating a 3.2% annual risk of stroke.   We discussed rhythm control options today. We discussed AAD and DCCV. He would like to try DCCV alone for now. Given his severe LA enlargement, he will likely need an AAD at some point.  Continue Eliquis 5 mg BID, he denies any missed doses.  Continue Lopressor 100 mg BID  2. Secondary Hypercoagulable State (ICD10:  D68.69) The patient is at significant risk for stroke/thromboembolism based upon his CHA2DS2-VASc Score of 3.  Continue Apixaban (Eliquis).   3. Obesity Body mass index is 36.07 kg/m. Lifestyle modification was discussed at length including regular exercise and weight reduction.  4. Snoring/daytime somnolence  The importance of adequate treatment of sleep apnea was discussed today in order to improve our ability to maintain sinus rhythm long term. Patient has deferred sleep study for now.  5. HTN Stable, no changes today.  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   Follow up in the AF clinic post DCCV.    [facility] [address] [place], [place] [address] [phone] 10/08/2022 1:23 PM

## 2022-10-19 ENCOUNTER — Other Ambulatory Visit: Payer: Self-pay | Admitting: Cardiology

## 2022-10-19 ENCOUNTER — Other Ambulatory Visit: Payer: Self-pay | Admitting: Nurse Practitioner

## 2022-10-19 ENCOUNTER — Other Ambulatory Visit: Payer: Self-pay

## 2022-10-19 ENCOUNTER — Other Ambulatory Visit (HOSPITAL_COMMUNITY): Payer: Self-pay | Admitting: *Deleted

## 2022-10-19 ENCOUNTER — Encounter (HOSPITAL_COMMUNITY): Payer: Self-pay | Admitting: *Deleted

## 2022-10-19 DIAGNOSIS — I4891 Unspecified atrial fibrillation: Secondary | ICD-10-CM

## 2022-10-19 DIAGNOSIS — I4819 Other persistent atrial fibrillation: Secondary | ICD-10-CM

## 2022-10-19 MED ORDER — APIXABAN 5 MG PO TABS
5.0000 mg | ORAL_TABLET | Freq: Two times a day (BID) | ORAL | 1 refills | Status: DC
  Filled 2022-10-19: qty 60, 30d supply, fill #0
  Filled 2022-11-23: qty 60, 30d supply, fill #1
  Filled 2022-12-28: qty 60, 30d supply, fill #2

## 2022-10-19 NOTE — Telephone Encounter (Signed)
Prescription refill request for Eliquis received. Indication: a fib Last office visit: 10/08/22 Scr: 1.5 09/18/22 Age: 62 Weight: 107kg

## 2022-10-19 NOTE — Telephone Encounter (Signed)
Requested medication (s) are due for refill today: yes  Requested medication (s) are on the active medication list: yes  Last refill:  09/19/22  Future visit scheduled: yes  Notes to clinic:  Unable to refill per protocol, last refill by another provider.      Requested Prescriptions  Pending Prescriptions Disp Refills   apixaban (ELIQUIS) 5 MG TABS tablet 60 tablet 0    Sig: Take 1 tablet (5 mg total) by mouth 2 (two) times daily.     Hematology:  Anticoagulants - apixaban Failed - 10/19/2022 10:22 AM      Failed - Cr in normal range and within 360 days    Creatinine, Ser  Date Value Ref Range Status  09/18/2022 1.50 (H) 0.61 - 1.24 mg/dL Final         Passed - PLT in normal range and within 360 days    Platelets  Date Value Ref Range Status  09/18/2022 388 150 - 400 K/uL Final  03/12/2022 273 150 - 450 x10E3/uL Final         Passed - HGB in normal range and within 360 days    Hemoglobin  Date Value Ref Range Status  09/18/2022 14.8 13.0 - 17.0 g/dL Final  03/12/2022 14.2 13.0 - 17.7 g/dL Final         Passed - HCT in normal range and within 360 days    HCT  Date Value Ref Range Status  09/18/2022 47.0 39.0 - 52.0 % Final   Hematocrit  Date Value Ref Range Status  03/12/2022 44.1 37.5 - 51.0 % Final         Passed - AST in normal range and within 360 days    AST  Date Value Ref Range Status  09/16/2022 20 15 - 41 U/L Final         Passed - ALT in normal range and within 360 days    ALT  Date Value Ref Range Status  09/16/2022 20 0 - 44 U/L Final         Passed - Valid encounter within last 12 months    Recent Outpatient Visits           1 month ago Type 2 diabetes mellitus with hyperglycemia, without long-term current use of insulin [facility])   [place], [place] W, NP   7 months ago Type 2 diabetes mellitus with hyperglycemia, without long-term current use of insulin [facility])   [place], Vernia Buff, NP   1 year ago Primary hypertension   [place], [place] W, NP   1 year ago Type 2 diabetes mellitus with hyperglycemia, without long-term current use of insulin [facility])   [place], Vernia Buff, NP       Future Appointments             In 1 month Gildardo Pounds, NP [place]

## 2022-11-09 ENCOUNTER — Other Ambulatory Visit: Payer: Self-pay

## 2022-11-09 ENCOUNTER — Ambulatory Visit (HOSPITAL_COMMUNITY): Payer: Self-pay | Admitting: Physician Assistant

## 2022-11-09 ENCOUNTER — Encounter (HOSPITAL_COMMUNITY): Payer: Self-pay

## 2022-11-15 ENCOUNTER — Telehealth: Payer: Self-pay | Admitting: Cardiology

## 2022-11-15 NOTE — Progress Notes (Signed)
While doing pre calls, patient states wife is currently in hospice care and will not be here for his procedure on 3/1. Called office to inform them.

## 2022-11-15 NOTE — Telephone Encounter (Signed)
Calling to make office aware that scheduled procedure for tomorrow was canceled by pt due to wife declining health. Please advise.

## 2022-11-15 NOTE — Telephone Encounter (Signed)
Will forward this message to our afib clinic to make them aware of this, being they ordered and scheduled the DCCV for the pt to have done.

## 2022-11-16 ENCOUNTER — Other Ambulatory Visit: Payer: Self-pay

## 2022-11-16 ENCOUNTER — Ambulatory Visit (HOSPITAL_COMMUNITY): Admission: RE | Admit: 2022-11-16 | Payer: Self-pay | Source: Ambulatory Visit | Admitting: Internal Medicine

## 2022-11-16 ENCOUNTER — Encounter (HOSPITAL_COMMUNITY): Admission: RE | Payer: Self-pay | Source: Ambulatory Visit

## 2022-11-16 SURGERY — CARDIOVERSION
Anesthesia: General

## 2022-11-23 ENCOUNTER — Ambulatory Visit (HOSPITAL_COMMUNITY): Payer: Self-pay | Admitting: Physician Assistant

## 2022-11-23 ENCOUNTER — Encounter (HOSPITAL_COMMUNITY): Payer: Self-pay

## 2022-11-26 ENCOUNTER — Other Ambulatory Visit: Payer: Self-pay

## 2022-11-28 ENCOUNTER — Ambulatory Visit: Payer: Self-pay | Admitting: *Deleted

## 2022-11-28 NOTE — Telephone Encounter (Signed)
Reason for Disposition  New or worsened shortness of breath with activity (dyspnea on exertion)    Son taking him to [facility]  Answer Assessment - Initial Assessment Questions 1. DESCRIPTION: "Please describe your heart rate or heartbeat that you are having" (e.g., fast/slow, regular/irregular, skipped or extra beats, "palpitations")     My wife died last Wed.    I feel like I'm about to pass out.  I just got off the bus and I'm climbing a hill so I'm feeling short of breath.    (I can hear it in his breathing).    When I get to where I'm going I'll sit down and I'll be better.   Dr. Raul Del knows I have the A.Fib.    I'm on Eliquis.   She referred me to the heart dr. But I could not go because my wife died last week.    It took me 30 minutes to walk home today from the bus stop whereas it usually takes me 5 minutes but I had to keep stopping and getting my breath.   I'm home now   My son is with me.   I referred him to the ED at this point.    He was agreeable to going and is going to have his son take him to [facility].  2. ONSET: "When did it start?" (Minutes, hours or days)      A few minutes ago.   See above.   Geryl Rankins, NP did an EKG on me and saw the A. Fib.    She referred me to the heart dr. 3. DURATION: "How long does it last" (e.g., seconds, minutes, hours)     Once I sit down after a few minutes my breath comes back.   Today it took me 30 minutes to walk home from the bus stop.   It's usually 5 minutes and I'm home. 4. PATTERN "Does it come and go, or has it been constant since it started?"  "Does it get worse with exertion?"   "Are you feeling it now?"     She is short of breath now.   His breathing got better after getting home and sitting down while I was talking with him. 5. TAP: "Using your hand, can you tap out what you are feeling on a chair or table in front of you, so that I can hear?" (Note: not all patients can do this)       Not asked 6. HEART RATE: "Can you tell  me your heart rate?" "How many beats in 15 seconds?"  (Note: not all patients can do this)       Not asked    Knows he has A. Fib. 7. RECURRENT SYMPTOM: "Have you ever had this before?" If Yes, ask: "When was the last time?" and "What happened that time?"      No   They just found out I have the A. Fib. 8. CAUSE: "What do you think is causing the palpitations?"     A. Fib 9. CARDIAC HISTORY: "Do you have any history of heart disease?" (e.g., heart attack, angina, bypass surgery, angioplasty, arrhythmia)      Recently found out he is in A. Fib.   10. OTHER SYMPTOMS: "Do you have any other symptoms?" (e.g., dizziness, chest pain, sweating, difficulty breathing)       Short of breath with activity.    Denies chest pain, dizziness, feeling like he is going to pass out. 11.  PREGNANCY: "Is there any chance you are pregnant?" "When was your last menstrual period?"       N/A  Protocols used: Heart Rate and Heartbeat Questions-A-AH

## 2022-11-28 NOTE — Telephone Encounter (Signed)
  Chief Complaint: A.Fib. Symptoms: Very short of breath walking home from the bus stop.  Took 30 min. To walk home because he had to keep stopping and getting his breath.   It usually takes 5 min. For him to get home.   He was short of breath while talking with me. Frequency: Recently found out he has A. Fib on EKG done at [facility] Pertinent Negatives: Patient denies chest pain, dizziness, lightheadedness. Disposition: [x] ED /[] Urgent Care (no appt availability in office) / [] Appointment(In office/virtual)/ []  [facility]/ [] Home Care/ [] Refused Recommended Disposition /[] [place] [facility]/ []  Follow-up with PCP Additional Notes: His son is with him and is going to take him to [facility] now.

## 2022-11-29 NOTE — Telephone Encounter (Signed)
Noted  

## 2022-12-03 ENCOUNTER — Encounter (HOSPITAL_COMMUNITY): Payer: Self-pay | Admitting: Internal Medicine

## 2022-12-03 ENCOUNTER — Other Ambulatory Visit (HOSPITAL_COMMUNITY): Payer: Self-pay

## 2022-12-03 ENCOUNTER — Observation Stay (HOSPITAL_COMMUNITY)
Admission: EM | Admit: 2022-12-03 | Discharge: 2022-12-06 | Disposition: A | Discharge status: Home / Self Care | Payer: 59 | Attending: Internal Medicine | Admitting: Internal Medicine

## 2022-12-03 ENCOUNTER — Emergency Department (HOSPITAL_COMMUNITY): Payer: 59

## 2022-12-03 ENCOUNTER — Other Ambulatory Visit: Payer: Self-pay

## 2022-12-03 ENCOUNTER — Observation Stay (HOSPITAL_BASED_OUTPATIENT_CLINIC_OR_DEPARTMENT_OTHER): Payer: 59

## 2022-12-03 DIAGNOSIS — N179 Acute kidney failure, unspecified: Secondary | ICD-10-CM | POA: Diagnosis present

## 2022-12-03 DIAGNOSIS — D509 Iron deficiency anemia, unspecified: Secondary | ICD-10-CM | POA: Diagnosis present

## 2022-12-03 DIAGNOSIS — Z1152 Encounter for screening for COVID-19: Secondary | ICD-10-CM

## 2022-12-03 DIAGNOSIS — R0600 Dyspnea, unspecified: Secondary | ICD-10-CM | POA: Insufficient documentation

## 2022-12-03 DIAGNOSIS — I10 Essential (primary) hypertension: Secondary | ICD-10-CM | POA: Insufficient documentation

## 2022-12-03 DIAGNOSIS — Z7984 Long term (current) use of oral hypoglycemic drugs: Secondary | ICD-10-CM | POA: Diagnosis not present

## 2022-12-03 DIAGNOSIS — Z7901 Long term (current) use of anticoagulants: Secondary | ICD-10-CM

## 2022-12-03 DIAGNOSIS — I4891 Unspecified atrial fibrillation: Principal | ICD-10-CM | POA: Insufficient documentation

## 2022-12-03 DIAGNOSIS — I5023 Acute on chronic systolic (congestive) heart failure: Secondary | ICD-10-CM | POA: Diagnosis not present

## 2022-12-03 DIAGNOSIS — E785 Hyperlipidemia, unspecified: Secondary | ICD-10-CM | POA: Diagnosis not present

## 2022-12-03 DIAGNOSIS — I081 Rheumatic disorders of both mitral and tricuspid valves: Secondary | ICD-10-CM | POA: Insufficient documentation

## 2022-12-03 DIAGNOSIS — I5022 Chronic systolic (congestive) heart failure: Secondary | ICD-10-CM | POA: Insufficient documentation

## 2022-12-03 DIAGNOSIS — Z833 Family history of diabetes mellitus: Secondary | ICD-10-CM

## 2022-12-03 DIAGNOSIS — K219 Gastro-esophageal reflux disease without esophagitis: Secondary | ICD-10-CM | POA: Diagnosis present

## 2022-12-03 DIAGNOSIS — Z634 Disappearance and death of family member: Secondary | ICD-10-CM

## 2022-12-03 DIAGNOSIS — Z5901 Sheltered homelessness: Secondary | ICD-10-CM

## 2022-12-03 DIAGNOSIS — I110 Hypertensive heart disease with heart failure: Principal | ICD-10-CM | POA: Diagnosis present

## 2022-12-03 DIAGNOSIS — J9811 Atelectasis: Secondary | ICD-10-CM | POA: Diagnosis present

## 2022-12-03 DIAGNOSIS — E669 Obesity, unspecified: Secondary | ICD-10-CM | POA: Diagnosis present

## 2022-12-03 DIAGNOSIS — I959 Hypotension, unspecified: Secondary | ICD-10-CM | POA: Diagnosis present

## 2022-12-03 DIAGNOSIS — Z79899 Other long term (current) drug therapy: Secondary | ICD-10-CM

## 2022-12-03 DIAGNOSIS — E119 Type 2 diabetes mellitus without complications: Secondary | ICD-10-CM | POA: Insufficient documentation

## 2022-12-03 DIAGNOSIS — T502X5A Adverse effect of carbonic-anhydrase inhibitors, benzothiadiazides and other diuretics, initial encounter: Secondary | ICD-10-CM | POA: Diagnosis present

## 2022-12-03 DIAGNOSIS — Z8249 Family history of ischemic heart disease and other diseases of the circulatory system: Secondary | ICD-10-CM

## 2022-12-03 DIAGNOSIS — I509 Heart failure, unspecified: Secondary | ICD-10-CM | POA: Diagnosis not present

## 2022-12-03 DIAGNOSIS — Z818 Family history of other mental and behavioral disorders: Secondary | ICD-10-CM

## 2022-12-03 DIAGNOSIS — Z91141 Patient's other noncompliance with medication regimen due to financial hardship: Secondary | ICD-10-CM

## 2022-12-03 DIAGNOSIS — Z6837 Body mass index (BMI) 37.0-37.9, adult: Secondary | ICD-10-CM

## 2022-12-03 DIAGNOSIS — I480 Paroxysmal atrial fibrillation: Secondary | ICD-10-CM | POA: Diagnosis present

## 2022-12-03 LAB — PROTIME-INR
INR: 1.3 — ABNORMAL HIGH (ref 0.8–1.2)
Prothrombin Time: 16.3 seconds — ABNORMAL HIGH (ref 11.4–15.2)

## 2022-12-03 LAB — APTT: aPTT: 31 seconds (ref 24–36)

## 2022-12-03 LAB — ECHOCARDIOGRAM COMPLETE
Calc EF: 40.9 %
Height: 68 in
MV M vel: 4.21 m/s
MV Peak grad: 70.9 mmHg
Radius: 0.4 cm
S' Lateral: 4.1 cm
Single Plane A2C EF: 49.6 %
Single Plane A4C EF: 37 %
Weight: 3680 oz

## 2022-12-03 LAB — CBC
HCT: 36.5 % — ABNORMAL LOW (ref 39.0–52.0)
Hemoglobin: 11 g/dL — ABNORMAL LOW (ref 13.0–17.0)
MCH: 22.5 pg — ABNORMAL LOW (ref 26.0–34.0)
MCHC: 30.1 g/dL (ref 30.0–36.0)
MCV: 74.8 fL — ABNORMAL LOW (ref 80.0–100.0)
Platelets: 335 10*3/uL (ref 150–400)
RBC: 4.88 MIL/uL (ref 4.22–5.81)
RDW: 17.9 % — ABNORMAL HIGH (ref 11.5–15.5)
WBC: 9.5 10*3/uL (ref 4.0–10.5)
nRBC: 0 % (ref 0.0–0.2)

## 2022-12-03 LAB — BASIC METABOLIC PANEL
Anion gap: 9 (ref 5–15)
BUN: 11 mg/dL (ref 8–23)
CO2: 23 mmol/L (ref 22–32)
Calcium: 8.7 mg/dL — ABNORMAL LOW (ref 8.9–10.3)
Chloride: 106 mmol/L (ref 98–111)
Creatinine, Ser: 0.86 mg/dL (ref 0.61–1.24)
GFR, Estimated: >60 mL/min (ref >60)
Glucose, Bld: 120 mg/dL — ABNORMAL HIGH (ref 70–99)
Potassium: 3.5 mmol/L (ref 3.5–5.1)
Sodium: 138 mmol/L (ref 135–145)

## 2022-12-03 LAB — FERRITIN: Ferritin: 8 ng/mL — ABNORMAL LOW (ref 24–336)

## 2022-12-03 LAB — BRAIN NATRIURETIC PEPTIDE: B Natriuretic Peptide: 223.6 pg/mL — ABNORMAL HIGH (ref 0.0–100.0)

## 2022-12-03 LAB — RESP PANEL BY RT-PCR (RSV, FLU A&B, COVID)  RVPGX2
Influenza A by PCR: NEGATIVE
Influenza B by PCR: NEGATIVE
Resp Syncytial Virus by PCR: NEGATIVE
SARS Coronavirus 2 by RT PCR: NEGATIVE

## 2022-12-03 LAB — GLUCOSE, CAPILLARY
Glucose-Capillary: 75 mg/dL (ref 70–99)
Glucose-Capillary: 96 mg/dL (ref 70–99)

## 2022-12-03 LAB — IRON AND TIBC
Iron: 44 ug/dL — ABNORMAL LOW (ref 45–182)
Saturation Ratios: 9 % — ABNORMAL LOW (ref 17.9–39.5)
TIBC: 477 ug/dL — ABNORMAL HIGH (ref 250–450)
UIBC: 433 ug/dL

## 2022-12-03 LAB — CBG MONITORING, ED: Glucose-Capillary: 141 mg/dL — ABNORMAL HIGH (ref 70–99)

## 2022-12-03 LAB — RETICULOCYTES
Immature Retic Fract: 29.4 % — ABNORMAL HIGH (ref 2.3–15.9)
RBC.: 4.85 MIL/uL (ref 4.22–5.81)
Retic Count, Absolute: 71.3 10*3/uL (ref 19.0–186.0)
Retic Ct Pct: 1.5 % (ref 0.4–3.1)

## 2022-12-03 LAB — MAGNESIUM: Magnesium: 2 mg/dL (ref 1.7–2.4)

## 2022-12-03 LAB — TSH: TSH: 1.797 u[IU]/mL (ref 0.350–4.500)

## 2022-12-03 MED ORDER — METOPROLOL TARTRATE 25 MG PO TABS
100.0000 mg | ORAL_TABLET | Freq: Once | ORAL | Status: AC
  Administered 2022-12-03: 100 mg via ORAL
  Filled 2022-12-03: qty 4

## 2022-12-03 MED ORDER — APIXABAN 5 MG PO TABS
5.0000 mg | ORAL_TABLET | Freq: Two times a day (BID) | ORAL | Status: DC
  Administered 2022-12-03 – 2022-12-06 (×6): 5 mg via ORAL
  Filled 2022-12-03 (×6): qty 1

## 2022-12-03 MED ORDER — ATORVASTATIN CALCIUM 10 MG PO TABS
10.0000 mg | ORAL_TABLET | Freq: Every day | ORAL | Status: DC
  Administered 2022-12-03 – 2022-12-06 (×4): 10 mg via ORAL
  Filled 2022-12-03 (×4): qty 1

## 2022-12-03 MED ORDER — METOPROLOL TARTRATE 100 MG PO TABS
100.0000 mg | ORAL_TABLET | Freq: Two times a day (BID) | ORAL | Status: DC
  Administered 2022-12-03 – 2022-12-06 (×6): 100 mg via ORAL
  Filled 2022-12-03 (×6): qty 1

## 2022-12-03 MED ORDER — PERFLUTREN LIPID MICROSPHERE
1.0000 mL | INTRAVENOUS | Status: AC | PRN
  Administered 2022-12-03: 3 mL via INTRAVENOUS

## 2022-12-03 MED ORDER — POLYETHYLENE GLYCOL 3350 17 G PO PACK: 17.0000 g | PACK | Freq: Every day | ORAL | Status: DC | PRN

## 2022-12-03 MED ORDER — ACETAMINOPHEN 325 MG PO TABS
650.0000 mg | ORAL_TABLET | Freq: Four times a day (QID) | ORAL | Status: DC | PRN
  Administered 2022-12-03 – 2022-12-05 (×3): 650 mg via ORAL
  Filled 2022-12-03 (×3): qty 2

## 2022-12-03 MED ORDER — ACETAMINOPHEN 650 MG RE SUPP: 650.0000 mg | Freq: Four times a day (QID) | RECTAL | Status: DC | PRN

## 2022-12-03 MED ORDER — DILTIAZEM HCL-DEXTROSE 125-5 MG/125ML-% IV SOLN (PREMIX)
5.0000 mg/h | INTRAVENOUS | Status: DC
  Administered 2022-12-03: 5 mg/h via INTRAVENOUS
  Filled 2022-12-03: qty 125

## 2022-12-03 MED ORDER — DAPAGLIFLOZIN PROPANEDIOL 10 MG PO TABS
10.0000 mg | ORAL_TABLET | Freq: Every day | ORAL | Status: DC
  Administered 2022-12-04 – 2022-12-06 (×3): 10 mg via ORAL
  Filled 2022-12-03 (×3): qty 1

## 2022-12-03 MED ORDER — APIXABAN 5 MG PO TABS: 5.0000 mg | ORAL_TABLET | Freq: Two times a day (BID) | ORAL | Status: DC

## 2022-12-03 MED ORDER — FUROSEMIDE 10 MG/ML IJ SOLN
80.0000 mg | Freq: Once | INTRAMUSCULAR | Status: AC
  Administered 2022-12-03: 80 mg via INTRAVENOUS
  Filled 2022-12-03: qty 8

## 2022-12-03 MED ORDER — DAPAGLIFLOZIN PROPANEDIOL 5 MG PO TABS: 5.0000 mg | ORAL_TABLET | Freq: Every day | ORAL | Status: DC

## 2022-12-03 MED ORDER — INSULIN ASPART 100 UNIT/ML IJ SOLN
0.0000 [IU] | Freq: Three times a day (TID) | INTRAMUSCULAR | Status: DC
  Administered 2022-12-03: 2 [IU] via SUBCUTANEOUS

## 2022-12-03 NOTE — ED Provider Notes (Signed)
[facility] Provider Note   CSN: [id number] Arrival date & time: 12/03/22  0534     History  Chief Complaint  Patient presents with   Shortness of Breath    Pt brought from home. Pt was feeling short of breath. Per EMS was hearing wheezing on scene.    Irregular Heart Beat    Jonathan Gonzalez is a 62 y.o. male.  The history is provided by the patient.  Patient with history of diabetes, hypertension, obesity, atrial fibrillation presents with shortness of breath.  Patient reports for the past 2-3 days he has had increasing shortness of breath.  He reports dyspnea on exertion.  He does report some difficulty breathing at night.  He reports difficulty affording some medications including Wilder Glade, and that seems to be when things started to get worse Patient also reports recent death of his wife, and he has not been sleeping well  since that time He reports he was supposed to have an outpatient cardioversion for atrial fibrillation, but this had to be canceled due to the death of his wife.  He reports compliance with his Eliquis every day.  He is a non-smoker.  He reports this morning he woke up to drive to his job in [place] but was too out of breath and so he called 911  EMS reported the patient was tachycardic and given Cardizem. Patient denies any active chest pain   Past Medical History:  Diagnosis Date   Diabetes mellitus without complication ([place])    Hyperlipidemia    Hypertension     Home Medications Prior to Admission medications   Medication Sig Start Date End Date Taking? Authorizing Provider  apixaban (ELIQUIS) 5 MG TABS tablet Take 1 tablet (5 mg total) by mouth 2 (two) times daily. 10/19/22   Jerline Pain, MD  Aspirin-Salicylamide-Caffeine (BC HEADACHE POWDER PO) Take 1 Package by mouth daily as needed (Headache).    [provider]  atorvastatin (LIPITOR) 10 MG tablet Take 1 tablet (10 mg total) by mouth  daily. For Cholesterol and diabetes Patient not taking: Reported on 11/13/2022 03/20/22   Gildardo Pounds, NP  Blood Glucose Monitoring Suppl (TRUE METRIX METER) w/Device KIT Use as instructed. Check blood glucose level by fingerstick twice per day. E11.65 11/25/20   Gildardo Pounds, NP  dapagliflozin propanediol (FARXIGA) 5 MG TABS tablet Take 1 tablet (5 mg total) by mouth daily before breakfast. 09/28/22   Gildardo Pounds, NP  glucose blood (TRUE METRIX BLOOD GLUCOSE TEST) test strip Use as instructed. Check blood glucose level by fingerstick twice per day. NEEDS PASS 11/25/20   Gildardo Pounds, NP  hydrOXYzine (VISTARIL) 25 MG capsule Take 1-2 capsules (25-50 mg total) by mouth at bedtime. For sleep Patient not taking: Reported on 11/13/2022 03/12/22   Gildardo Pounds, NP  lisinopril (ZESTRIL) 20 MG tablet TAKE 1 TABLET BY MOUTH ONCE A DAY 09/28/22   Gildardo Pounds, NP  metFORMIN (GLUCOPHAGE) 500 MG tablet Take 1 tablet (500 mg total) by mouth 2 (two) times daily with a meal. 03/12/22 12/30/22  Gildardo Pounds, NP  metoprolol tartrate (LOPRESSOR) 100 MG tablet Take 1 tablet (100 mg total) by mouth 2 (two) times daily. Patient not taking: Reported on 11/13/2022 09/19/22   Patrecia Pour, MD  omeprazole (PRILOSEC) 20 MG capsule Take 1 capsule (20 mg total) by mouth daily. FOR ACID REFLUX Patient not taking: Reported on 11/13/2022 03/12/22   Raul Del,  Vernia Buff, NP      Allergies    Patient has no known allergies.    Review of Systems   Review of Systems  Constitutional:  Positive for unexpected weight change. Negative for fever.  Respiratory:  Positive for cough and shortness of breath.        Denies hemoptysis  Cardiovascular:  Positive for leg swelling.    Physical Exam Updated Vital Signs BP 138/89   Pulse (!) 108   Temp 97.7 F (36.5 C) (Oral)   Resp 19   Ht 1.727 m (5\' 8" )   Wt 104.3 kg   SpO2 99%   BMI 34.97 kg/m  Physical Exam CONSTITUTIONAL: Well developed/well nourished,  ill-appearing HEAD: Normocephalic/atraumatic EYES: EOMI/PERRL ENMT: Mucous membranes moist NECK: supple no meningeal signs, + JVD CV: Tachycardic and irregular LUNGS: Mild tachypnea, crackles in the bilateral bases ABDOMEN: soft, nontender, obese NEURO: Pt is awake/alert/appropriate, moves all extremitiesx4.  No facial droop.   EXTREMITIES: pulses normal/equal, full ROM Edema noted to the lower extremities SKIN: warm, color normal PSYCH: no abnormalities of mood noted, alert and oriented to situation  ED Results / Procedures / Treatments   Labs (all labs ordered are listed, but only abnormal results are displayed) Labs Reviewed  BASIC METABOLIC PANEL - Abnormal; Notable for the following components:      Result Value   Glucose, Bld 120 (*)    Calcium 8.7 (*)    All other components within normal limits  BRAIN NATRIURETIC PEPTIDE - Abnormal; Notable for the following components:   B Natriuretic Peptide 223.6 (*)    All other components within normal limits  CBC - Abnormal; Notable for the following components:   Hemoglobin 11.0 (*)    HCT 36.5 (*)    MCV 74.8 (*)    MCH 22.5 (*)    RDW 17.9 (*)    All other components within normal limits  RESP PANEL BY RT-PCR (RSV, FLU A&B, COVID)  [id number]  MAGNESIUM    EKG EKG Interpretation  Date/Time:  Monday December 03 2022 05:39:38 EDT Ventricular Rate:  145 PR Interval:    QRS Duration: 75 QT Interval:  322 QTC Calculation: 501 R Axis:   68 Text Interpretation: Atrial fibrillation Low voltage, precordial leads Prolonged QT interval Confirmed by Ripley Fraise [phone]) on 12/03/2022 5:57:48 AM  Radiology DG Chest Portable 1 View  Result Date: 12/03/2022 CLINICAL DATA:  Shortness of breath EXAM: PORTABLE CHEST 1 VIEW COMPARISON:  09/14/2022 FINDINGS: Low volume chest. Asymmetric elevation of the left diaphragm. Hazy density at the bases attributed atelectasis. Artifact from EKG leads. Normal cardiothymic silhouette. IMPRESSION:  Low volume chest with atelectatic type opacity at the bases. Electronically Signed   By: Jorje Guild M.D.   On: 12/03/2022 06:14    Procedures .Critical Care  Performed by: Ripley Fraise, MD Authorized by: Ripley Fraise, MD   Critical care provider statement:    Critical care time (minutes):  64   Critical care start time:  12/03/2022 6:03 AM   Critical care end time:  12/03/2022 7:07 AM   Critical care time was exclusive of:  Separately billable procedures and treating other patients   Critical care was necessary to treat or prevent imminent or life-threatening deterioration of the following conditions:  Cardiac failure, circulatory failure and respiratory failure   Critical care was time spent personally by me on the following activities:  Examination of patient, development of treatment plan with patient or surrogate, re-evaluation of patient's condition, pulse  oximetry, ordering and review of radiographic studies, ordering and performing treatments and interventions, evaluation of patient's response to treatment, review of old charts and ordering and review of laboratory studies   I assumed direction of critical care for this patient from another provider in my specialty: no       Medications Ordered in ED Medications  diltiazem (CARDIZEM) 125 mg in dextrose 5% 125 mL (1 mg/mL) infusion (10 mg/hr Intravenous Rate/Dose Change 12/03/22 0700)  furosemide (LASIX) injection 80 mg (80 mg Intravenous Given 12/03/22 [id number])    ED Course/ Medical Decision Making/ A&P Clinical Course as of 12/03/22 0707  Mon Dec 03, 2022  0629 Patient tells me he is hesitant to be admitted.  He reports he must care for his adult autistic son.  He is willing to be treated here in the ER, will need to be discharged at some point.  He plans to follow-up as an outpatient for treatment of his A-fib for cardioversion [DW]  0646 Hemoglobin(!): 11.0 Acute on chronic anemia [DW]  0706 Plan at signout to dr  plunkett - monitor urine output/HR after meds.  Pt is starting diurese.  He will still hesitant to be admitted.   [DW]    Clinical Course User Index [DW] Ripley Fraise, MD                             Medical Decision Making Amount and/or Complexity of Data Reviewed Labs: ordered. Decision-making details documented in ED Course. Radiology: ordered.  Risk Prescription drug management.   This patient presents to the ED for concern of shortness of breath, this involves an extensive number of treatment options, and is a complaint that carries with it a high risk of complications and morbidity.  The differential diagnosis includes but is not limited to Acute coronary syndrome, pneumonia, acute pulmonary edema, pneumothorax, acute anemia, pulmonary embolism    Comorbidities that complicate the patient evaluation: Patient's presentation is complicated by their history of atrial fibrillation, hypertension  Social Determinants of Health: Patient's  recent death of his wife, difficulty affording medications   increases the complexity of managing their presentation  Additional history obtained: Records reviewed  cardiology notes reviewed  Lab Tests: I Ordered, and personally interpreted labs.  The pertinent results include:  mild anemia, elevated BNP  Imaging Studies ordered: I ordered imaging studies including X-ray chest   I independently visualized and interpreted imaging which showed hazy densities/?early CHF I agree with the radiologist interpretation  Cardiac Monitoring: The patient was maintained on a cardiac monitor.  I personally viewed and interpreted the cardiac monitor which showed an underlying rhythm of:  Atrial Fibrillation  Medicines ordered and prescription drug management: I ordered medication including cardizem and lasix  for atrial fibrillation and CHF  Reevaluation of the patient after these medicines showed that the patient    stayed the same   Critical  Interventions:  cardizem   Reevaluation: After the interventions noted above, I reevaluated the patient and found that they have :stayed the same  Complexity of problems addressed: Patient's presentation is most consistent with  acute presentation with potential threat to life or bodily function           Final Clinical Impression(s) / ED Diagnoses Final diagnoses:  Atrial fibrillation with rapid ventricular response ([place])  Acute on chronic systolic congestive heart failure ([place])    Rx / DC Orders ED Discharge Orders     None  Ripley Fraise, MD 12/03/22 [phone]

## 2022-12-03 NOTE — Progress Notes (Cosign Needed)
Transition of Care [facility]) - Emergency Department Mini Assessment   Patient Details  Name: Jonathan Gonzalez MRN: [id number] Date of Birth: 08-Dec-1960  [facility] CM/SW Contact:    Fuller Mandril, RN Phone Number: 12/03/2022, 9:51 AM   Clinical Narrative: RNCM consulted regarding pt needing medication assistance.  RNCM suggests sending Rx to [facility] to have filled and brought to pt at bedside prior to discharge home.  Pt will need enrollment in medication assistance program as he is uninsured.  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 [place] [address] Phone: [phone] Fax: [phone]

## 2022-12-03 NOTE — Consult Note (Addendum)
Cardiology Consultation   Patient ID: Jonathan Gonzalez MRN: [id number]; DOB: 1960/10/11  Admit date: 12/03/2022 Date of Consult: 12/03/2022  PCP:  Gildardo Pounds, NP   Gonzalez Providers Cardiologist:  Candee Furbish, MD        Patient Profile:   Jonathan Gonzalez is a 62 y.o. male with a hx of atrial fibrillation, HFmrEF, hypertension, hyperlipidemia, DM type II, who is being seen 12/03/2022 for the evaluation of afib with RVR at the request of Dr. Philipp Ovens.  History of Present Illness:   Jonathan Gonzalez presented to the ED today with EMS for evaluation/management of shortness of breath and palpitations.   Of note, patient was diagnosed with atrial fibrillation during a hospitalization from 12/29-1/3. He was started on Eliquis and Metoprolol with plans for outpatient DCCV. Patient was seen for follow up in afib clinic on 1/22 where he was noted to still be in Afib. Per notes from that visit, patient was having symptoms of exertional fatigue. DCCV plans were made. Unfortunately, due to the passing of his wife, patient had to cancel this procedure. Following the death of his wife, patient reports missing a couple of doses of Eliquis. He also appears to have been compliant with metoprolol until running out in the beginning of February. Patient was also unable to continue [place] when the cost increased from $10 to $200.   Since the passing of his wife, patient says that fatigue and exertional dyspnea have worsened. Endorses some orthopnea as well. He reports intermittent sensation of palpitations associated with dizziness/lightheadedness. 2 nights ago, patient reports noticing lower extremity swelling for the first time as well as abdominal distension. Intermittent palpitations also became almost constant. This morning, patient felt so short of breath that he couldn't make it to the bus stop and he decided to call EMS. In the ED, patient has a productive sounding cough which he says he  develops "every time the seasons change." He denies recent fevers, chills, body aches, illness but does report frequently feeling hot/sweaty.  In the ED, labs notable for BNP 223.6, HGB 11. CXR with "atelectatic type opacity at bases." ECG with afib, RVR with ventricular rates ~145. Patient placed on diltiazem infusion and given lasix. He also received home Metoprolol and rates are now significantly improved.    Past Medical History:  Diagnosis Date   Diabetes mellitus without complication (Jonathan Gonzalez)    Hyperlipidemia    Hypertension     Past Surgical History:  Procedure Laterality Date   HERNIA REPAIR       Home Medications:  Prior to Admission medications   Medication Sig Start Date End Date Taking? Authorizing Provider  apixaban (ELIQUIS) 5 MG TABS tablet Take 1 tablet (5 mg total) by mouth 2 (two) times daily. 10/19/22   Jerline Pain, MD  Aspirin-Salicylamide-Caffeine (BC HEADACHE POWDER PO) Take 1 Package by mouth daily as needed (Headache).    [provider]  atorvastatin (LIPITOR) 10 MG tablet Take 1 tablet (10 mg total) by mouth daily. For Cholesterol and diabetes Patient not taking: Reported on 11/13/2022 03/20/22   Gildardo Pounds, NP  Blood Glucose Monitoring Suppl (TRUE METRIX METER) w/Device KIT Use as instructed. Check blood glucose level by fingerstick twice per day. E11.65 11/25/20   Gildardo Pounds, NP  dapagliflozin propanediol (FARXIGA) 5 MG TABS tablet Take 1 tablet (5 mg total) by mouth daily before breakfast. 09/28/22   Gildardo Pounds, NP  glucose blood (TRUE METRIX BLOOD GLUCOSE TEST)  test strip Use as instructed. Check blood glucose level by fingerstick twice per day. NEEDS PASS 11/25/20   Gildardo Pounds, NP  hydrOXYzine (VISTARIL) 25 MG capsule Take 1-2 capsules (25-50 mg total) by mouth at bedtime. For sleep Patient not taking: Reported on 11/13/2022 03/12/22   Gildardo Pounds, NP  lisinopril (ZESTRIL) 20 MG tablet TAKE 1 TABLET BY MOUTH ONCE A DAY 09/28/22    Gildardo Pounds, NP  metFORMIN (GLUCOPHAGE) 500 MG tablet Take 1 tablet (500 mg total) by mouth 2 (two) times daily with a meal. 03/12/22 12/30/22  Gildardo Pounds, NP  metoprolol tartrate (LOPRESSOR) 100 MG tablet Take 1 tablet (100 mg total) by mouth 2 (two) times daily. Patient not taking: Reported on 11/13/2022 09/19/22   Patrecia Pour, MD  omeprazole (PRILOSEC) 20 MG capsule Take 1 capsule (20 mg total) by mouth daily. FOR ACID REFLUX Patient not taking: Reported on 11/13/2022 03/12/22   Gildardo Pounds, NP    Inpatient Medications: Scheduled Meds:  apixaban  5 mg Oral BID   atorvastatin  10 mg Oral Daily   insulin aspart  0-15 Units Subcutaneous TID WC   metoprolol tartrate  100 mg Oral BID   Continuous Infusions:  diltiazem (CARDIZEM) infusion 10 mg/hr (12/03/22 0853)   PRN Meds: acetaminophen **OR** acetaminophen, polyethylene glycol  Allergies:   No Known Allergies  Social History:   Social History   Socioeconomic History   Marital status: Married    Spouse name: Not on file   Number of children: Not on file   Years of education: Not on file   Highest education level: Not on file  Occupational History   Not on file  Tobacco Use   Smoking status: Never   Smokeless tobacco: Never   Tobacco comments:    Never smoke 10/08/22  Vaping Use   Vaping Use: Never used  Substance and Sexual Activity   Alcohol use: Never   Drug use: Never   Sexual activity: Not Currently  Other Topics Concern   Not on file  Social History Narrative   Not on file   Social Determinants of Health   Financial Resource Strain: Not on file  Food Insecurity: Not on file  Transportation Needs: Not on file  Physical Activity: Not on file  Stress: Not on file  Social Connections: Not on file  Intimate Partner Violence: Not on file    Family History:   No family history on file.   ROS:  Please see the history of present illness.   All other ROS reviewed and negative.     Physical  Exam/Data:   Vitals:   12/03/22 0730 12/03/22 0804 12/03/22 0830 12/03/22 0900  BP: (!) 158/109  124/73 104/79  Pulse: (!) 58 (!) 135 66 82  Resp: (!) 21  15 15   Temp:      TempSrc:      SpO2: 93%  99% 99%  Weight:      Height:       No intake or output data in the 24 hours ending 12/03/22 1007    12/03/2022    5:44 AM 10/08/2022    1:16 PM 09/19/2022    4:20 AM  Last 3 Weights  Weight (lbs) 230 lb 237 lb 3.2 oz 222 lb 0.1 oz  Weight (kg) 104.327 kg 107.593 kg 100.7 kg     Body mass index is 34.97 kg/m.  General:  Patient appears short of breath, can only speak 4-5 words  at a time. HEENT: normal Neck: Due to body habitus, JVP difficult to appreciate Vascular: No carotid bruits; Distal pulses 2+ bilaterally Cardiac:  normal S1, S2; RRR; no murmur Lungs: Bilateral middle/lower lobe rhonchi and crackles. Abd: distended with normal bowel sounds Ext: 2+ lower extremity edema bilaterally Musculoskeletal:  No deformities, BUE and BLE strength normal and equal Skin: warm and dry  Neuro:  CNs 2-12 intact, no focal abnormalities noted Psych:  Normal affect   EKG:  The EKG was personally reviewed and demonstrates:  Initial ECG tracing with afib and RVR with rates around 145. Telemetry:  Telemetry was personally reviewed and demonstrates:  Telemetry data appears incomplete due to patient moving rooms within the ED. Appears to have been in NSR at times this morning with   Relevant CV Studies:  09/15/22 TTE  IMPRESSIONS     1. Left ventricular ejection fraction, by estimation, is 45 to 50%. The  left ventricle has mildly decreased function. The left ventricle  demonstrates global hypokinesis. Left ventricular diastolic function could  not be evaluated.   2. Right ventricular systolic function is normal. The right ventricular  size is normal. Tricuspid regurgitation signal is inadequate for assessing  PA pressure.   3. Left atrial size was severely dilated.   4. The mitral valve  is normal in structure. Mild mitral valve  regurgitation.   5. The aortic valve is tricuspid. Aortic valve regurgitation is trivial.  Aortic valve sclerosis is present, with no evidence of aortic valve  stenosis.   6. The inferior vena cava is normal in size with greater than 50%  respiratory variability, suggesting right atrial pressure of 3 mmHg.   FINDINGS   Left Ventricle: Left ventricular ejection fraction, by estimation, is 45  to 50%. The left ventricle has mildly decreased function. The left  ventricle demonstrates global hypokinesis. The left ventricular internal  cavity size was normal in size. There is   no left ventricular hypertrophy. Left ventricular diastolic function  could not be evaluated due to atrial fibrillation. Left ventricular  diastolic function could not be evaluated.   Right Ventricle: The right ventricular size is normal. No increase in  right ventricular wall thickness. Right ventricular systolic function is  normal. Tricuspid regurgitation signal is inadequate for assessing PA  pressure.   Left Atrium: Left atrial size was severely dilated.   Right Atrium: Right atrial size was normal in size.   Pericardium: There is no evidence of pericardial effusion.   Mitral Valve: The mitral valve is normal in structure. Mild mitral valve  regurgitation.   Tricuspid Valve: The tricuspid valve is normal in structure. Tricuspid  valve regurgitation is trivial.   Aortic Valve: The aortic valve is tricuspid. Aortic valve regurgitation is  trivial. Aortic valve sclerosis is present, with no evidence of aortic  valve stenosis.   Pulmonic Valve: The pulmonic valve was normal in structure. Pulmonic valve  regurgitation is not visualized.   Aorta: The aortic root and ascending aorta are structurally normal, with  no evidence of dilitation.   Venous: The inferior vena cava is normal in size with greater than 50%  respiratory variability, suggesting right atrial  pressure of 3 mmHg.   IAS/Shunts: No atrial level shunt detected by color flow Doppler.    12/03/22 TTE  IMPRESSIONS     1. Left ventricular ejection fraction, by estimation, is 35 to 40%. The  left ventricle has moderately decreased function. The left ventricle  demonstrates global hypokinesis. Left ventricular diastolic function  could  not be evaluated.   2. Right ventricular systolic function is mildly reduced. The right  ventricular size is normal. There is normal pulmonary artery systolic  pressure. The estimated right ventricular systolic pressure is Q000111Q mmHg.   3. Left atrial size was mildly dilated.   4. The mitral valve is abnormal. Moderate mitral valve regurgitation. No  evidence of mitral stenosis.   5. Tricuspid valve regurgitation is mild to moderate.   6. The aortic valve is tricuspid. Aortic valve regurgitation is not  visualized. No aortic stenosis is present.   7. The inferior vena cava is dilated in size with >50% respiratory  variability, suggesting right atrial pressure of 8 mmHg.   Comparison(s): Changes from prior study are noted. The left ventricular  function is worsened.   FINDINGS   Left Ventricle: Left ventricular ejection fraction, by estimation, is 35  to 40%. The left ventricle has moderately decreased function. The left  ventricle demonstrates global hypokinesis. Definity contrast agent was  given IV to delineate the left  ventricular endocardial borders. The left ventricular internal cavity size  was normal in size. There is no left ventricular hypertrophy. Left  ventricular diastolic function could not be evaluated due to atrial  fibrillation. Left ventricular diastolic  function could not be evaluated.   Right Ventricle: The right ventricular size is normal. No increase in  right ventricular wall thickness. Right ventricular systolic function is  mildly reduced. There is normal pulmonary artery systolic pressure. The  tricuspid  regurgitant velocity is 2.62  m/s, and with an assumed right atrial pressure of 8 mmHg, the estimated  right ventricular systolic pressure is Q000111Q mmHg.   Left Atrium: Left atrial size was mildly dilated.   Right Atrium: Right atrial size was normal in size.   Pericardium: There is no evidence of pericardial effusion.   Mitral Valve: The mitral valve is abnormal. Moderate mitral valve  regurgitation. No evidence of mitral valve stenosis.   Tricuspid Valve: The tricuspid valve is grossly normal. Tricuspid valve  regurgitation is mild to moderate. No evidence of tricuspid stenosis.   Aortic Valve: The aortic valve is tricuspid. Aortic valve regurgitation is  not visualized. No aortic stenosis is present.   Pulmonic Valve: The pulmonic valve was grossly normal. Pulmonic valve  regurgitation is trivial. No evidence of pulmonic stenosis.   Aorta: The aortic root and ascending aorta are structurally normal, with  no evidence of dilitation.   Venous: The inferior vena cava is dilated in size with greater than 50%  respiratory variability, suggesting right atrial pressure of 8 mmHg.   IAS/Shunts: The atrial septum is grossly normal.   Laboratory Data:  High Sensitivity Troponin:  No results for input(s): "TROPONINIHS" in the last 720 hours.   Chemistry Recent Labs  Lab 12/03/22 0543  NA 138  K 3.5  CL 106  CO2 23  GLUCOSE 120*  BUN 11  CREATININE 0.86  CALCIUM 8.7*  MG 2.0  GFRNONAA >60  ANIONGAP 9    No results for input(s): "PROT", "ALBUMIN", "AST", "ALT", "ALKPHOS", "BILITOT" in the last 168 hours. Lipids No results for input(s): "CHOL", "TRIG", "HDL", "LABVLDL", "LDLCALC", "CHOLHDL" in the last 168 hours.  Hematology Recent Labs  Lab 12/03/22 0543  WBC 9.5  RBC 4.88  HGB 11.0*  HCT 36.5*  MCV 74.8*  MCH 22.5*  MCHC 30.1  RDW 17.9*  PLT 335   Thyroid  Recent Labs  Lab 12/03/22 0543  TSH 1.797    BNP  Recent Labs  Lab 12/03/22 0543  BNP 223.6*     DDimer No results for input(s): "DDIMER" in the last 168 hours.   Radiology/Studies:  DG Chest Portable 1 View  Result Date: 12/03/2022 CLINICAL DATA:  Shortness of breath EXAM: PORTABLE CHEST 1 VIEW COMPARISON:  09/14/2022 FINDINGS: Low volume chest. Asymmetric elevation of the left diaphragm. Hazy density at the bases attributed atelectasis. Artifact from EKG leads. Normal cardiothymic silhouette. IMPRESSION: Low volume chest with atelectatic type opacity at the bases. Electronically Signed   By: Jorje Guild M.D.   On: 12/03/2022 06:14     Assessment and Plan:  JAHSAI PENNACHIO is a 62 y.o. male with a hx of atrial fibrillation, HFmrEF, hypertension, hyperlipidemia, DM type II, who is being seen 12/03/2022 for the evaluation of afib with RVR at the request of Dr. Philipp Ovens.   Atrial fibrillation with RVR CHA2DS2-VASc 3  Patient admitted with RVR and exertional dyspnea. First found with afib in December 2023. He was planned for outpatient DCCV but had to cancel due to the death of his spouse.  Unfortunately patient has been without rate control for at least the last month and I suspect that frequent RVR has lead to current HF symptoms. Initial ED ECG with afib/RVR but review of telemetry does show a period of sinus bradycardia this morning before patient returned to rate controlled afib. Given paroxysmal afib, could consider anti-arrhythmic management, will discuss with MD but at this time favor re-establishing consistent rate control with close outpatient follow up.  Continue Metoprolol 100mg  BID. Given HFmrEF will wean off diltiazem.  Continue Eliquis. Ongoing compliance essential. Continue to have concerns for untreated OSA, patient very much needs outpatient sleep study.  HFmrEF  Patient with LVEF 45-50% in December in the setting of afib with RVR. Found with elevated BNP today, 223.6. A repeat echocardiogram ordered by admitting service shows LVEF slightly more depressed, 35-40%.  Ultimately, patient needs rhythm control and future TTE after several months of adequate rate/rhythm control for true assessment of LVEF.   Patient does appear volume up on exam with pulmonary rales and abdominal distension. Continue IV lasix, will give a second dose, 80mg , this afternoon. Would plan to start ARB rather than resuming home Lisinopril if BP tolerates. This would facilitate potential initiation of Entresto. Patient previously on Farxiga, though discontinued when cost increased dramatically. Will plan to work with HF and [facility] staff to find cost assistance. Increase to 10mg  QD. Consider adding MRA this admission if BP allows.  Daily standing weights. Close monitoring of I/O  Hypertension  BP initially up, now trending low with diltiazem and metoprolol. Will plan to stop diltiazem given reduced LVEF. As above, would favor arb over ACEi to potentially enable Entresto.   Hyperlipidemia  Continue Atorvastatin.   Per primary team:  DM type II  Lab Results  Component Value Date   LDLCALC 73 09/15/2022     Risk Assessment/Risk Scores:        New York Heart Association (NYHA) Functional Class NYHA Class III  CHA2DS2-VASc Score = 3   This indicates a 3.2% annual risk of stroke. The patient's score is based upon: CHF History: 1 HTN History: 1 Diabetes History: 1 Stroke History: 0 Vascular Disease History: 0 Age Score: 0 Gender Score: 0         For questions or updates, please contact [place] Please consult [email or web address] for contact info under    Signed, Tabius Torain, PA-C  12/03/2022 10:07 AM  Personally seen and examined. Agree with above.  62 year old with recent episode of A-fib with RVR, reduced ejection fraction previously 45% currently 35 to 40%, creatinine 0.86, hemoglobin 11 with a BNP of 223 here with shortness of breath exertional dyspnea  On exam, appears to have increased work of breathing, heart rate regular with occasional  ectopy.  Acute systolic heart failure A-fib with RVR paroxysmal - IV Lasix 80 mg twice daily, monitor creatinine and potassium. -Metoprolol for rate control.  Unable to afford [place].  Continue to try to add goal-directed medical therapy as best as possible.  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  Will try to avoid use of diltiazem given reduction in ejection fraction.  Continue with Eliquis.  Candee Furbish, MD

## 2022-12-03 NOTE — TOC Progression Note (Signed)
Transition of Care [facility]) - Progression Note    Patient Details  Name: Jonathan Gonzalez MRN: [id number] Date of Birth: [birth year]  Transition of Care [facility]) CM/SW Contact  Fuller Mandril, RN Phone Number: 12/03/2022, 10:09 AM  Clinical Narrative:    Per pharmacy regarding [facility]: pt has a $4,495.00 deductible; he would have to have a denial letter from [facility] in order to be able to get patient assistance.      Barriers to Discharge: Continued Medical Work up, ED Medication assistance  Expected Discharge Plan and Services                                               Social Determinants of Health (SDOH) Interventions SDOH Screenings   Depression (PHQ2-9): Medium Risk (09/14/2022)  Tobacco Use: Low Risk  (10/08/2022)    Readmission Risk Interventions     No data to display

## 2022-12-03 NOTE — Progress Notes (Signed)
Echocardiogram 2D Echocardiogram has been performed.  Jonathan Gonzalez 12/03/2022, 11:19 AM

## 2022-12-03 NOTE — TOC Progression Note (Signed)
Transition of Care [facility]) - Progression Note    Patient Details  Name: Jonathan Gonzalez MRN: [id number] Date of Birth: 1961/02/12  Transition of Care [facility]) CM/SW Contact  Fuller Mandril, RN Phone Number: 12/03/2022, 11:28 AM  Clinical Narrative:    Pt consulted registration regarding insurance coverage to find:   INACTIVE COVERAGE Patient: [name] As of: November 16, 2022 Payer: Medicaid [place]  Alerts  PAYER INDICATING COVERAGE IS INACTIVE     Barriers to Discharge: Continued Medical Work up, ED Medication assistance  Expected Discharge Plan and Services                                               Social Determinants of Health (SDOH) Interventions SDOH Screenings   Depression (PHQ2-9): Medium Risk (09/14/2022)  Tobacco Use: Low Risk  (10/08/2022)    Readmission Risk Interventions     No data to display

## 2022-12-03 NOTE — ED Notes (Addendum)
Per verbal orders from MD [facility], will back Cardizem drip down to 10 mL/hr

## 2022-12-03 NOTE — ED Provider Notes (Signed)
Going in and speaking with the patient he is currently on a Cardizem drip but heart rate still in the 140s to 150s and patient is requiring nasal cannula oxygen with some mild wheezing.  Patient has not been taking metoprolol that he is aware of but reports he is taking his other medications just not the [place] because he ran out of it and it was too expensive to refill.  Pt also reports he has missed a few doses of his eliquis when his wife was ill and dying and he had ran out of his prescriptions and that was a few weeks ago.  Pt agreeable to admission at this time.  Spoke with internal medicine for admission but will also discuss with cardiology as patient may need cardioversion while admitted.  Also spoke with transition of care team about the cost of his medication and looking to see if he can get a discount.   Blanchie Dessert, MD 12/03/22 [phone]

## 2022-12-03 NOTE — Evaluation (Signed)
Physical Therapy Evaluation Patient Details Name: Jonathan Gonzalez MRN: [id number] DOB: 10/22/1960 Today's Date: 12/03/2022  History of Present Illness  62 y.o. male presents to [facility] hospital on 12/03/2022 with SOB, found to be in afib with RVR. PMH includes DM, HTN, obesity, afib.  Clinical Impression  Pt presents to PT with deficits in activity tolerance and cardiopulmonary function. Pt is mobilizing independently at this time, but does report DOE when ambulating, despite stable sats in high 90s. Pt does sound wheezy when mobilizing at this time. PT encourages frequent mobilization in an effort to improve cardiopulmonary and neuromuscular endurance. No post-acute PT recommended.       Recommendations for follow up therapy are one component of a multi-disciplinary discharge planning process, led by the attending physician.  Recommendations may be updated based on patient status, additional functional criteria and insurance authorization.  Follow Up Recommendations No PT follow up      Assistance Recommended at Discharge None  Patient can return home with the following  Assist for transportation    Equipment Recommendations None recommended by PT  Recommendations for Other Services       Functional Status Assessment Patient has had a recent decline in their functional status and demonstrates the ability to make significant improvements in function in a reasonable and predictable amount of time.     Precautions / Restrictions Precautions Precautions: None Restrictions Weight Bearing Restrictions: No      Mobility  Bed Mobility Overal bed mobility: Independent                  Transfers Overall transfer level: Independent Equipment used: None                    Ambulation/Gait Ambulation/Gait assistance: Independent Gait Distance (Feet): 400 Feet Assistive device: None Gait Pattern/deviations: WFL(Within Functional Limits) Gait velocity: functional Gait  velocity interpretation: 1.31 - 2.62 ft/sec, indicative of limited community ambulator   General Gait Details: steady step-through Development worker, international aid    Modified Rankin (Stroke Patients Only)       Balance Overall balance assessment: Independent                                           Pertinent Vitals/Pain Pain Assessment Pain Assessment: 0-10 Pain Score: 5  Pain Location: upper abdomen Pain Descriptors / Indicators: Sore Pain Intervention(s): Monitored during session    Home Living Family/patient expects to be discharged to:: Other (Comment) Living Arrangements: Children (son)                 Additional Comments: Hotel    Prior Function Prior Level of Function : Independent/Modified Independent                     Journalist, newspaper        Extremity/Trunk Assessment   Upper Extremity Assessment Upper Extremity Assessment: Overall WFL for tasks assessed    Lower Extremity Assessment Lower Extremity Assessment: Overall WFL for tasks assessed    Cervical / Trunk Assessment Cervical / Trunk Assessment: Normal  Communication   Communication: No difficulties  Cognition Arousal/Alertness: Awake/alert Behavior During Therapy: WFL for tasks assessed/performed Overall Cognitive Status: Within Functional Limits for tasks assessed  General Comments General comments (skin integrity, edema, etc.): VSS on RA, HR in low 100s when ambulating. Pt reports DOE when mobilizing although sats in mid to high 90s on room air    Exercises     Assessment/Plan    PT Assessment Patient needs continued PT services  PT Problem List Decreased activity tolerance;Cardiopulmonary status limiting activity       PT Treatment Interventions DME instruction;Gait training;Therapeutic exercise;Patient/family education    PT Goals (Current goals can be found in the  Care Plan section)  Acute Rehab PT Goals Patient Stated Goal: to improve activity tolerance PT Goal Formulation: With patient Time For Goal Achievement: 12/17/22 Potential to Achieve Goals: Good Additional Goals Additional Goal #1: Pt will ambulate for >500' reporting 1/4 DOE or less when mobilizing to demonstrate improved activity tolerance    Frequency Min 2X/week     Co-evaluation               AM-PAC PT "6 Clicks" Mobility  Outcome Measure Help needed turning from your back to your side while in a flat bed without using bedrails?: None Help needed moving from lying on your back to sitting on the side of a flat bed without using bedrails?: None Help needed moving to and from a bed to a chair (including a wheelchair)?: None Help needed standing up from a chair using your arms (e.g., wheelchair or bedside chair)?: None Help needed to walk in hospital room?: None Help needed climbing 3-5 steps with a railing? : None 6 Click Score: 24    End of Session   Activity Tolerance: Patient tolerated treatment well Patient left: in bed;with call bell/phone within reach Nurse Communication: Mobility status PT Visit Diagnosis: Other abnormalities of gait and mobility (R26.89)    Time: [id number] PT Time Calculation (min) (ACUTE ONLY): 14 min   Charges:   PT Evaluation $PT Eval Low Complexity: [facility], PT, DPT Acute Rehabilitation Office [phone]   Zenaida Niece 12/03/2022, 3:32 PM

## 2022-12-03 NOTE — H&P (Cosign Needed Addendum)
Date: 12/03/2022               Patient Name:  Jonathan Gonzalez MRN: [id number]  DOB: 1961/02/06 Age / Sex: 62 y.o., male   PCP: Gildardo Pounds, NP         Medical Service: Internal Medicine Teaching Service         Attending Physician: Dr. Velna Ochs, MD    First Contact: Dr. Gaylan Gerold Pager: [phone]  Second Contact: Dr. Leigh Aurora Pager: [phone]       After Hours (After 5p/  First Contact Pager: [phone]  weekends / holidays): Second Contact Pager: [phone]   Chief Complaint: [facility] with exertion, palpitations  History of Present Illness:   Jonathan Gonzalez is a 62 year old male living with HTN, HLD, T2DM, and atrial fibrillation on eliquis who presents with subacute progressive SHOB with exertion and palpitations. He states that he was diagnosed with atrial fibrillation at his last hospitalization near the end of December. After discharge, he was doing well up until the end of the 10-20-2022 when his wife passed away. He began feeling more short of breath with exertion, mainly limited to uphill walking and climbing stairs. He also intermittently noted palpitations and associated lightheadedness. Around this time, he also noted orthopnea. States that he has been feeling more fatigued over the past few weeks despite getting adequate sleep, something that his coworkers have also noted. Last night was the first time he noticed that his legs were more swollen, especially around his ankles. Today PTA, he was about to get on the bus to go to work, but experienced worsening [facility] with exertion and palpitations which prompted him to come to the ED for further evaluation. He denies any fevers, chills, chest pain, abdominal pain, diarrhea, melena, hematochezia, constipation, urinary changes.  Of note, patient was diagnosed with new-onset A-fib at last hospitalization in 08/2022 and was found to have new acute HFmrEF as well thought to be due to Afib. ECHO at that time revealed EF 45-50%  with global hypokinesis. He was discharged with metoprolol 100mg  BID and eliquis. Plan was for outpatient cardiology follow up for possible cardioversion. Unfortunately, patient cancelled this as his wife was near end of life and subsequently passed away.   In terms of his medications, he does report adherence with his eliquis. He may have missed 1 or 2 doses around the time his wife passed away, but otherwise has been fully adherent. He is also adherent with his lisinopril and metformin. He was previously adherent with his farxiga and was obtaining it for $10. However, recently the cost went up to $200 and he has been unable to afford this. He plans to speak with his PCP at next office visit. In regards to his metoprolol 100mg  BID and lipitor, he was adherent with these medications after hospital discharge as he had new scripts, but he was not prescribed any refills and has been unable to see his PCP since that time given his wife's state of health and thus has not been able to obtain any refills on these medications. Thinks that he ran out of metoprolol near the end of January.   ED course: Noted to be in HR 140s and placed on 2L [place] for comfort (no noted hypoxia). BP remained 130s-150s/90s-100s. CMP with stable kidney function and normal electrolytes. CBC with mild microcytic anemia (hgb 11, baseline around 11.7-12.7). BNP mildly elevated at 223. EKG revealed Afib with RVR and prolonged QT interval. CXR  with likely bibasilar atelectasis. He was started on a cardizem drip, given a dose of metoprolol 100mg  PO, and given a dose of IV lasix 80mg . EDP has consulted cardiology who will evaluate the patient. IMTS asked to admit for AF with RVR and likely HF exacerbation.   Meds:  -Eliquis 5mg  BID -Lisinopril 20mg  daily -metformin 500mg  daily  Not taking: -Lipitor 10mg  daily (ran out after last hospital visit, unable to get refill) -Metoprolol tartrate 100mg  BID (ran out after last hospital visit, unable to  get refill) -farxiga 5mg  daily (unable to get due to recent cost increase) -prilosec 20mg  daily (no longer experiencing reflux symptoms)   Allergies: Allergies as of 12/03/2022   (No Known Allergies)   Past Medical History:  Diagnosis Date   Diabetes mellitus without complication ([place])    Hyperlipidemia    Hypertension     Family History:  -Father passed from MI at 37 -Mother with heart disease, MI, T1DM (passed)  Social History:  -has a son that lives with him, take care of each other -wife passed away recently (end of 23-Oct-2022) -independent in ADLs and iADLs -works at [employer] - takes printed paper off of press -No tobacco use history and does not use recreational drugs -former alcohol user, quit in 2010 -PCP: Geryl Rankins, NP at [facility]: A complete ROS was negative except as per HPI.   Physical Exam: Blood pressure 104/79, pulse 82, temperature 97.7 F (36.5 C), temperature source Oral, resp. rate 15, height 5\' 8"  (1.727 m), weight 104.3 kg, SpO2 99 %. Physical Exam Constitutional:      Appearance: He is obese. He is not ill-appearing.  HENT:     Head: Normocephalic and atraumatic.     Mouth/Throat:     Mouth: Mucous membranes are moist.     Pharynx: Oropharynx is clear.  Eyes:     Extraocular Movements: Extraocular movements intact.     Pupils: Pupils are equal, round, and reactive to light.  Cardiovascular:     Rate and Rhythm: Normal rate.     Pulses: Normal pulses.     Heart sounds: Normal heart sounds. No murmur heard.    No friction rub. No gallop.     Comments: Irregularly irregular rhythm. No JVD appreciated. Pulmonary:     Effort: No respiratory distress.     Breath sounds: No decreased breath sounds, wheezing or rhonchi.     Comments: Slightly labored breathing. Mild end-inspiratory bibasilar crackles. O2 sats >98% on RA, placed on 2L for comfort. Abdominal:     General: Bowel sounds are  normal.     Palpations: Abdomen is soft.     Tenderness: There is no abdominal tenderness. There is no guarding or rebound.  Musculoskeletal:     Comments: 1+ edema in bilateral LE up to mid-shins.  Skin:    General: Skin is dry.     Comments: Cool legs bilaterally.  Neurological:     General: No focal deficit present.     Mental Status: He is alert and oriented to person, place, and time.  Psychiatric:        Mood and Affect: Mood normal.        Behavior: Behavior normal.     EKG: personally reviewed my interpretation is atrial fibrillation with RVR, prolonged QT interval  CXR: personally reviewed my interpretation is bibasilar atelectasis.  Assessment & Plan by Problem: Principal Problem:   Atrial fibrillation with RVR (HCC) Active Problems:  DM (diabetes mellitus) ([place])   HTN (hypertension)   HLD (hyperlipidemia)   Heart failure with mildly reduced ejection fraction (HFmrEF) (HCC)  Atrial fibrillation with RVR HFmrEF exacerbation likely 2/2 AF Patient with Afib diagnosed about 3 months ago and HFmrEF thought to be 2/2 AF. ECHO at last hospitalization with EF 45-50%, global hypokinesis, severe LA dilation, no valvular abnormalities, and no shunt detected. In regards to his Afib, he has been adherent with his eliquis (aside from maybe a couple of doses when his wife passed). He was discharged with rate control strategy (metoprolol tartrate) but ran out of this about 1.5 months ago. He was not converted to normal sinus rhythm at last hospitalization and has been unable to follow up with cardiology as an outpatient for possible cardioversion. He does note intermittent palpitations over the past couple of months and likely has been in and out of RVR, especially given lack of rate control medication. From a HFmrEF standpoint, this is almost certainly due to underlying Afib and thus I think that ischemic evaluation can be held off unless if no improvement despite correction of AF (will  likely need another ECHO in a few months). He was not discharged with lasix at last hospitalization as he became euvolemic after 2 doses of IV lasix. He has received one dose in the ED so will monitor UOP and consider a second dose of lasix either later today or tomorrow pending UOP. Weight is currently 104.3kg. His estimated dry weight is likely around 102-103kg (discharged around 101kg but developed an AKI at this weight likely from overdiuresis). Given unclear duration of RVR in the outpatient setting, will obtain repeat ECHO to reassess heart function. Cardiology has been consulted and will evaluate patient for possible TEE/DCCV. I do believe that the patient would benefit from early rhythm control strategy given concomitant T2DM, HTN, and HF as per the EAST-AFNET 4 RCT. His Wells score places him at low risk and tachycardia is more likely explained by Afib, low suspicion for PE. -cardiology consulted, appreciate management -continue cardizem gtt, wean to off as tolerated with target HR 65-105 -resumed home eliquis -resumed metoprolol tartrate 100mg  BID -resumed farxiga -consider adding MRA once able -holding lisinopril given low BP -strict I/O's, consider additional dose of lasix later today or tomorrow pending UOP -telemetry -f/u TSH  -f/u ECHO -PT eval  Microcytic anemia Hgb of 11 on arrival, mildly down from baseline of around 11.7-12.7. He denies any overt bleeding (no melena, hematochezia, hematuria, hematemesis). Eliquis typically has a lower risk of GIB than other DOACs, but nonetheless he is at higher risk than general population. Given microcytosis, possible component of iron deficiency. Will check reticulocytes, iron studies, and trend hemoglobin curve for now. -f/u reticulocytes, iron studies -trend hgb, transfuse if hgb <7  T2DM A1c 6.6% on 09/14/2022. Has been adherent with home metformin 500mg  daily. Has been unable to obtain farxiga given cost increase. Will need to see if we  can provide this at an affordable price for him at discharge until PCP follow up. This will be an important medication for him moving forward given HFmrEF and T2DM.  -resumed farxiga -moderate SSI -trend CBGs  HTN Will hold home lisinopril for now given low normal BP (most recent 104/79) and need for rate control medications (cardizem gtt, metoprolol tartrate). Can consider restarting lisinopril versus an ARB once off cardizem drip and BP can tolerate.  -holding lisinopril -cardizem gtt and metoprolol tartrate 100mg  BID for rate control as above -placed holding parameter  on metoprolol to hold if HR <65 and/or SBP <110  HLD Will need refill of lipitor at discharge. -continue home lipitor 10mg  daily  Dispo: Admit patient to Observation with expected length of stay less than 2 midnights.  Signed: Virl Axe, MD 12/03/2022, 9:50 AM  Pager: [phone] After 5pm on weekdays and 1pm on weekends: On Call pager: [phone]

## 2022-12-03 NOTE — ED Triage Notes (Signed)
Pt brought from home for shortness of breath and wheezing on scene. En route 20 mg  of Caredizem was administered. Pt was tachy in the 190s and came down to the 90s to 140s. Pt took his rescue inhaler at home before ems arrival. Pt was placed on 2L [place] for comfort. Pt denies CHF.

## 2022-12-04 ENCOUNTER — Other Ambulatory Visit (HOSPITAL_COMMUNITY): Payer: Self-pay

## 2022-12-04 ENCOUNTER — Inpatient Hospital Stay (HOSPITAL_COMMUNITY): Payer: 59

## 2022-12-04 DIAGNOSIS — E785 Hyperlipidemia, unspecified: Secondary | ICD-10-CM | POA: Diagnosis present

## 2022-12-04 DIAGNOSIS — E669 Obesity, unspecified: Secondary | ICD-10-CM | POA: Diagnosis present

## 2022-12-04 DIAGNOSIS — I959 Hypotension, unspecified: Secondary | ICD-10-CM | POA: Diagnosis not present

## 2022-12-04 DIAGNOSIS — I110 Hypertensive heart disease with heart failure: Secondary | ICD-10-CM | POA: Diagnosis not present

## 2022-12-04 DIAGNOSIS — I5020 Unspecified systolic (congestive) heart failure: Secondary | ICD-10-CM

## 2022-12-04 DIAGNOSIS — Z634 Disappearance and death of family member: Secondary | ICD-10-CM | POA: Diagnosis not present

## 2022-12-04 DIAGNOSIS — J9811 Atelectasis: Secondary | ICD-10-CM | POA: Diagnosis present

## 2022-12-04 DIAGNOSIS — D509 Iron deficiency anemia, unspecified: Secondary | ICD-10-CM | POA: Diagnosis present

## 2022-12-04 DIAGNOSIS — I10 Essential (primary) hypertension: Secondary | ICD-10-CM | POA: Diagnosis not present

## 2022-12-04 DIAGNOSIS — Z5901 Sheltered homelessness: Secondary | ICD-10-CM | POA: Diagnosis not present

## 2022-12-04 DIAGNOSIS — I5023 Acute on chronic systolic (congestive) heart failure: Secondary | ICD-10-CM | POA: Diagnosis not present

## 2022-12-04 DIAGNOSIS — E119 Type 2 diabetes mellitus without complications: Secondary | ICD-10-CM | POA: Diagnosis not present

## 2022-12-04 DIAGNOSIS — I4891 Unspecified atrial fibrillation: Secondary | ICD-10-CM

## 2022-12-04 DIAGNOSIS — K219 Gastro-esophageal reflux disease without esophagitis: Secondary | ICD-10-CM | POA: Diagnosis not present

## 2022-12-04 DIAGNOSIS — N179 Acute kidney failure, unspecified: Secondary | ICD-10-CM | POA: Diagnosis not present

## 2022-12-04 DIAGNOSIS — Z833 Family history of diabetes mellitus: Secondary | ICD-10-CM | POA: Diagnosis not present

## 2022-12-04 DIAGNOSIS — Z818 Family history of other mental and behavioral disorders: Secondary | ICD-10-CM | POA: Diagnosis not present

## 2022-12-04 DIAGNOSIS — Z91141 Patient's other noncompliance with medication regimen due to financial hardship: Secondary | ICD-10-CM | POA: Diagnosis not present

## 2022-12-04 DIAGNOSIS — Z7901 Long term (current) use of anticoagulants: Secondary | ICD-10-CM | POA: Diagnosis not present

## 2022-12-04 DIAGNOSIS — Z79899 Other long term (current) drug therapy: Secondary | ICD-10-CM | POA: Diagnosis not present

## 2022-12-04 DIAGNOSIS — Z7984 Long term (current) use of oral hypoglycemic drugs: Secondary | ICD-10-CM | POA: Diagnosis not present

## 2022-12-04 DIAGNOSIS — I480 Paroxysmal atrial fibrillation: Secondary | ICD-10-CM | POA: Diagnosis not present

## 2022-12-04 DIAGNOSIS — Z8249 Family history of ischemic heart disease and other diseases of the circulatory system: Secondary | ICD-10-CM | POA: Diagnosis not present

## 2022-12-04 DIAGNOSIS — Z1152 Encounter for screening for COVID-19: Secondary | ICD-10-CM | POA: Diagnosis not present

## 2022-12-04 DIAGNOSIS — I081 Rheumatic disorders of both mitral and tricuspid valves: Secondary | ICD-10-CM | POA: Diagnosis not present

## 2022-12-04 DIAGNOSIS — Z6837 Body mass index (BMI) 37.0-37.9, adult: Secondary | ICD-10-CM | POA: Diagnosis not present

## 2022-12-04 DIAGNOSIS — T502X5A Adverse effect of carbonic-anhydrase inhibitors, benzothiadiazides and other diuretics, initial encounter: Secondary | ICD-10-CM | POA: Diagnosis not present

## 2022-12-04 LAB — COMPREHENSIVE METABOLIC PANEL
ALT: 37 U/L (ref 0–44)
AST: 37 U/L (ref 15–41)
Albumin: 3.5 g/dL (ref 3.5–5.0)
Alkaline Phosphatase: 65 U/L (ref 38–126)
Anion gap: 9 (ref 5–15)
BUN: 18 mg/dL (ref 8–23)
CO2: 24 mmol/L (ref 22–32)
Calcium: 9.1 mg/dL (ref 8.9–10.3)
Chloride: 103 mmol/L (ref 98–111)
Creatinine, Ser: 1.29 mg/dL — ABNORMAL HIGH (ref 0.61–1.24)
GFR, Estimated: >60 mL/min (ref >60)
Glucose, Bld: 118 mg/dL — ABNORMAL HIGH (ref 70–99)
Potassium: 3.8 mmol/L (ref 3.5–5.1)
Sodium: 136 mmol/L (ref 135–145)
Total Bilirubin: 1.2 mg/dL (ref 0.3–1.2)
Total Protein: 6.8 g/dL (ref 6.5–8.1)

## 2022-12-04 LAB — CBC
HCT: 39.4 % (ref 39.0–52.0)
Hemoglobin: 11.9 g/dL — ABNORMAL LOW (ref 13.0–17.0)
MCH: 22.2 pg — ABNORMAL LOW (ref 26.0–34.0)
MCHC: 30.2 g/dL (ref 30.0–36.0)
MCV: 73.5 fL — ABNORMAL LOW (ref 80.0–100.0)
Platelets: 401 10*3/uL — ABNORMAL HIGH (ref 150–400)
RBC: 5.36 MIL/uL (ref 4.22–5.81)
RDW: 18.3 % — ABNORMAL HIGH (ref 11.5–15.5)
WBC: 10.9 10*3/uL — ABNORMAL HIGH (ref 4.0–10.5)
nRBC: 0 % (ref 0.0–0.2)

## 2022-12-04 LAB — GLUCOSE, CAPILLARY
Glucose-Capillary: 83 mg/dL (ref 70–99)
Glucose-Capillary: 70 mg/dL (ref 70–99)
Glucose-Capillary: 108 mg/dL — ABNORMAL HIGH (ref 70–99)
Glucose-Capillary: 88 mg/dL (ref 70–99)

## 2022-12-04 MED ORDER — IPRATROPIUM-ALBUTEROL 0.5-2.5 (3) MG/3ML IN SOLN
3.0000 mL | Freq: Four times a day (QID) | RESPIRATORY_TRACT | Status: DC | PRN
  Administered 2022-12-04 (×2): 3 mL via RESPIRATORY_TRACT
  Filled 2022-12-04 (×2): qty 3

## 2022-12-04 MED ORDER — FUROSEMIDE 10 MG/ML IJ SOLN
80.0000 mg | Freq: Two times a day (BID) | INTRAMUSCULAR | Status: DC
  Administered 2022-12-04 – 2022-12-06 (×5): 80 mg via INTRAVENOUS
  Filled 2022-12-04 (×5): qty 8

## 2022-12-04 MED ORDER — METOPROLOL TARTRATE 5 MG/5ML IV SOLN: 5.0000 mg | INTRAVENOUS | Status: DC | PRN

## 2022-12-04 MED ORDER — LEVALBUTEROL HCL 0.63 MG/3ML IN NEBU: 0.6300 mg | INHALATION_SOLUTION | Freq: Four times a day (QID) | RESPIRATORY_TRACT | Status: DC | PRN

## 2022-12-04 NOTE — Progress Notes (Addendum)
Rounding Note    Patient Name: Jonathan Gonzalez Date of Encounter: 12/04/2022  [place] Cardiologist: Candee Furbish, MD   Subjective   Pt with shortness of breath and increased work of breathing, coughing.  Inpatient Medications    Scheduled Meds:  apixaban  5 mg Oral BID   atorvastatin  10 mg Oral Daily   dapagliflozin propanediol  10 mg Oral QAC breakfast   insulin aspart  0-15 Units Subcutaneous TID WC   metoprolol tartrate  100 mg Oral BID   Continuous Infusions:  PRN Meds: acetaminophen **OR** acetaminophen, ipratropium-albuterol, metoprolol tartrate, polyethylene glycol   Vital Signs    Vitals:   12/03/22 2359 12/04/22 0200 12/04/22 0205 12/04/22 0332  BP: (!) 124/90  (!) 138/97 (!) 127/97  Pulse: 93 (!) 138 (!) 117 70  Resp: 20  (!) 21 20  Temp: 98 F (36.7 C)  98.2 F (36.8 C) 98.7 F (37.1 C)  TempSrc: Oral  Oral Oral  SpO2: 97% 94% 98% 99%  Weight:    110.1 kg  Height:        Intake/Output Summary (Last 24 hours) at 12/04/2022 0726 Last data filed at 12/04/2022 0457 Gross per 24 hour  Intake 50.46 ml  Output 1350 ml  Net -1299.54 ml      12/04/2022    3:32 AM 12/03/2022    5:44 AM 10/08/2022    1:16 PM  Last 3 Weights  Weight (lbs) 242 lb 12.8 oz 230 lb 237 lb 3.2 oz  Weight (kg) 110.133 kg 104.327 kg 107.593 kg      Telemetry    Afib in the 90-100s during sleeping hours,rates in the 120s when awake, 140s with movement - Personally Reviewed  ECG    No new tracings - Personally Reviewed  Physical Exam   GEN: No acute distress but increased WOB  Neck: + JVD Cardiac: irregular rhythm, tachycardic rate Respiratory: crackles, diminished in bases, cough GI: Soft, nontender, non-distended  MS: B LE edema Neuro:  Nonfocal  Psych: Normal affect   Labs    High Sensitivity Troponin:  No results for input(s): "TROPONINIHS" in the last 720 hours.   Chemistry Recent Labs  Lab 12/03/22 0543 12/04/22 0211  NA 138 136  K 3.5  3.8  CL 106 103  CO2 23 24  GLUCOSE 120* 118*  BUN 11 18  CREATININE 0.86 1.29*  CALCIUM 8.7* 9.1  MG 2.0  --   PROT  --  6.8  ALBUMIN  --  3.5  AST  --  37  ALT  --  37  ALKPHOS  --  65  BILITOT  --  1.2  GFRNONAA >60 >60  ANIONGAP 9 9    Lipids No results for input(s): "CHOL", "TRIG", "HDL", "LABVLDL", "LDLCALC", "CHOLHDL" in the last 168 hours.  Hematology Recent Labs  Lab 12/03/22 0543 12/03/22 1606 12/04/22 0211  WBC 9.5  --  10.9*  RBC 4.88 4.85 5.36  HGB 11.0*  --  11.9*  HCT 36.5*  --  39.4  MCV 74.8*  --  73.5*  MCH 22.5*  --  22.2*  MCHC 30.1  --  30.2  RDW 17.9*  --  18.3*  PLT 335  --  401*   Thyroid  Recent Labs  Lab 12/03/22 0543  TSH 1.797    BNP Recent Labs  Lab 12/03/22 0543  BNP 223.6*    DDimer No results for input(s): "DDIMER" in the last 168 hours.   Radiology  ECHOCARDIOGRAM COMPLETE  Result Date: 12/03/2022    ECHOCARDIOGRAM REPORT   Patient Name:   Jonathan Gonzalez Date of Exam: 12/03/2022 Medical Rec #:  [id number]        Height:       68.0 in Accession #:    [id number]       Weight:       230.0 lb Date of Birth:  November 04, 1960        BSA:          2.169 m Patient Age:    62 years         BP:           104/79 mmHg Patient Gender: M                HR:           71 bpm. Exam Location:  Inpatient Procedure: 2D Echo, Cardiac Doppler, Color Doppler and Intracardiac            Opacification Agent Indications:    I48.91* Unspeicified atrial fibrillation  History:        Patient has prior history of Echocardiogram examinations, most                 recent 09/15/2022. Arrythmias:Atrial Fibrillation; Risk                 Factors:Hypertension, Dyslipidemia and Diabetes.  Sonographer:    Bernadene Person RDCS Referring Phys: [id number] Velna Ochs  Sonographer Comments: Patient is obese and Technically difficult study due to poor echo windows. Pt sitting up and coughing throughout exam. IMPRESSIONS  1. Left ventricular ejection fraction, by estimation, is  35 to 40%. The left ventricle has moderately decreased function. The left ventricle demonstrates global hypokinesis. Left ventricular diastolic function could not be evaluated.  2. Right ventricular systolic function is mildly reduced. The right ventricular size is normal. There is normal pulmonary artery systolic pressure. The estimated right ventricular systolic pressure is Q000111Q mmHg.  3. Left atrial size was mildly dilated.  4. The mitral valve is abnormal. Moderate mitral valve regurgitation. No evidence of mitral stenosis.  5. Tricuspid valve regurgitation is mild to moderate.  6. The aortic valve is tricuspid. Aortic valve regurgitation is not visualized. No aortic stenosis is present.  7. The inferior vena cava is dilated in size with >50% respiratory variability, suggesting right atrial pressure of 8 mmHg. Comparison(s): Changes from prior study are noted. The left ventricular function is worsened. FINDINGS  Left Ventricle: Left ventricular ejection fraction, by estimation, is 35 to 40%. The left ventricle has moderately decreased function. The left ventricle demonstrates global hypokinesis. Definity contrast agent was given IV to delineate the left ventricular endocardial borders. The left ventricular internal cavity size was normal in size. There is no left ventricular hypertrophy. Left ventricular diastolic function could not be evaluated due to atrial fibrillation. Left ventricular diastolic function could not be evaluated. Right Ventricle: The right ventricular size is normal. No increase in right ventricular wall thickness. Right ventricular systolic function is mildly reduced. There is normal pulmonary artery systolic pressure. The tricuspid regurgitant velocity is 2.62 m/s, and with an assumed right atrial pressure of 8 mmHg, the estimated right ventricular systolic pressure is Q000111Q mmHg. Left Atrium: Left atrial size was mildly dilated. Right Atrium: Right atrial size was normal in size.  Pericardium: There is no evidence of pericardial effusion. Mitral Valve: The mitral valve is abnormal. Moderate mitral valve regurgitation. No evidence of mitral valve  stenosis. Tricuspid Valve: The tricuspid valve is grossly normal. Tricuspid valve regurgitation is mild to moderate. No evidence of tricuspid stenosis. Aortic Valve: The aortic valve is tricuspid. Aortic valve regurgitation is not visualized. No aortic stenosis is present. Pulmonic Valve: The pulmonic valve was grossly normal. Pulmonic valve regurgitation is trivial. No evidence of pulmonic stenosis. Aorta: The aortic root and ascending aorta are structurally normal, with no evidence of dilitation. Venous: The inferior vena cava is dilated in size with greater than 50% respiratory variability, suggesting right atrial pressure of 8 mmHg. IAS/Shunts: The atrial septum is grossly normal.  LEFT VENTRICLE PLAX 2D LVIDd:         4.70 cm LVIDs:         4.10 cm LV PW:         0.80 cm LV IVS:        1.10 cm LVOT diam:     1.80 cm LV SV:         33 LV SV Index:   15 LVOT Area:     2.54 cm  LV Volumes (MOD) LV vol d, MOD A2C: 122.0 ml LV vol d, MOD A4C: 96.3 ml LV vol s, MOD A2C: 61.5 ml LV vol s, MOD A4C: 60.7 ml LV SV MOD A2C:     60.5 ml LV SV MOD A4C:     96.3 ml LV SV MOD BP:      44.6 ml RIGHT VENTRICLE RV S prime:     7.14 cm/s TAPSE (M-mode): 1.1 cm LEFT ATRIUM             Index        RIGHT ATRIUM           Index LA diam:        4.20 cm 1.94 cm/m   RA Area:     17.70 cm LA Vol (A2C):   66.1 ml 30.48 ml/m  RA Volume:   45.60 ml  21.03 ml/m LA Vol (A4C):   57.7 ml 26.60 ml/m LA Biplane Vol: 63.4 ml 29.23 ml/m  AORTIC VALVE LVOT Vmax:   83.60 cm/s LVOT Vmean:  48.433 cm/s LVOT VTI:    0.130 m  AORTA Ao Root diam: 3.00 cm Ao Asc diam:  3.60 cm MR Peak grad:    70.9 mmHg    TRICUSPID VALVE MR Mean grad:    50.5 mmHg    TR Peak grad:   27.5 mmHg MR Vmax:         421.00 cm/s  TR Vmax:        262.00 cm/s MR Vmean:        337.5 cm/s MR PISA:         1.01  cm     SHUNTS MR PISA Eff ROA: 10 mm       Systemic VTI:  0.13 m MR PISA Radius:  0.40 cm      Systemic Diam: 1.80 cm Eleonore Chiquito MD Electronically signed by Eleonore Chiquito MD Signature Date/Time: 12/03/2022/11:40:11 AM    Final    DG Chest Portable 1 View  Result Date: 12/03/2022 CLINICAL DATA:  Shortness of breath EXAM: PORTABLE CHEST 1 VIEW COMPARISON:  09/14/2022 FINDINGS: Low volume chest. Asymmetric elevation of the left diaphragm. Hazy density at the bases attributed atelectasis. Artifact from EKG leads. Normal cardiothymic silhouette. IMPRESSION: Low volume chest with atelectatic type opacity at the bases. Electronically Signed   By: Jorje Guild M.D.   On: 12/03/2022 06:14  Cardiac Studies   Echo 12/03/22: 1. Left ventricular ejection fraction, by estimation, is 35 to 40%. The  left ventricle has moderately decreased function. The left ventricle  demonstrates global hypokinesis. Left ventricular diastolic function could  not be evaluated.   2. Right ventricular systolic function is mildly reduced. The right  ventricular size is normal. There is normal pulmonary artery systolic  pressure. The estimated right ventricular systolic pressure is Q000111Q mmHg.   3. Left atrial size was mildly dilated.   4. The mitral valve is abnormal. Moderate mitral valve regurgitation. No  evidence of mitral stenosis.   5. Tricuspid valve regurgitation is mild to moderate.   6. The aortic valve is tricuspid. Aortic valve regurgitation is not  visualized. No aortic stenosis is present.   7. The inferior vena cava is dilated in size with >50% respiratory  variability, suggesting right atrial pressure of 8 mmHg.    Patient Profile     62 y.o. male with a hx of atrial fibrillation, HFmrEF, hypertension, hyperlipidemia, DM type II, who is being seen 12/03/2022 for the evaluation of afib with RVR.  Assessment & Plan    Atrial fibrillation with RVR - ran out of metoprolol, has missed doses of  eliquis - canceled planned OP DCCV due to his wife passing  - rate control currently with 100 mg metoprolol BID and initially cardizem - rate control in the 90-100s during sleeping hours, but RVR with movement - suspect he may ultimately need TEE-DCCV inpatient and he is agreeable to this - needs aggressive diuresis first   Need for chronic anticoagulation - anticoagulated with eliquis - has missed doses - no signs of bleeding   Acute on chronic systolic heart failure Hypertension Suspect RVR may be contributing to his CHF exacerbation and reduced EF from 45-50% to now 35-40% - PTA: farxiga, lisinopril, lopressor - diuresing on 80 mg IV lasix x 2 doses, no further doses ordered - plan to hold lisinopril for possible ARB --> entresto - will need to check cost for farxiga/jardiance - overall net negative 1.3 L with 1.3 cc urine output yesterday - weights likely not accurate as readings suggest an 8 lb weight gain - increased work of breathing today, satting 98% with Ila - rales and crackles,  have ordered 80 mg IV lasix to be given now and BID - consider repeat CXR this afternoon, mild leukocytosis today - BNP was a bit lower than expected given his exam, BMI 37 - sCr 1.29 (0.86)      For questions or updates, please contact [place] Please consult [email or web address] for contact info under        Signed, Ledora Bottcher, PA  12/04/2022, 7:26 AM    Personally seen and examined. Agree with above.  Shortness of breath has been fairly chronic in review of prior notes.  He does appear to be with increased work of breathing.  Yesterday when I saw him in the emergency room it was similar.  I was hoping that IV diuresis would help.  Perhaps slightly.  We will continue with increased dose of Lasix 80 mg IV twice daily.  Ejection fraction lower this admit 35-40.  Holding lisinopril for possible Entresto in the future.  Exam consistent with fluid overload as well as airway  restriction.  We are going to recheck a chest x-ray.  We will also put him on the books for TEE cardioversion on Thursday but he is not ready for this yet.  We need  to optimize his breathing status.  Candee Furbish, MD

## 2022-12-04 NOTE — Progress Notes (Signed)
Mobility Specialist Progress Note:   12/04/22 0945  Mobility  Activity Ambulated with assistance in hallway;Ambulated independently in hallway  Level of Assistance Independent  Assistive Device None  Distance Ambulated (ft) 500 ft  Activity Response Tolerated well  Mobility Referral Yes  $Mobility charge 1 Mobility   Pt agreeable to mobility session. Required no physical assistance throughout. SpO2 88% while ambulating on RA, 2LO2 donned. Pt recovered to high 90s, able to ween back down to RA during ambulation at 92%. Pt left sitting in chair with all needs met, on RA.  Nelta Numbers Mobility Specialist Please contact via SecureChat or  Rehab office at [phone]

## 2022-12-04 NOTE — Progress Notes (Signed)
   12/04/22 0205  Assess: MEWS Score  Temp 98.2 F (36.8 C)  BP (!) 138/97  MAP (mmHg) 104  Pulse Rate (!) 117  ECG Heart Rate (!) 106  Resp (!) 21  SpO2 98 %  O2 Device Nasal Cannula  O2 Flow Rate (L/min) 2 L/min  Assess: MEWS Score  MEWS Temp 0  MEWS Systolic 0  MEWS Pulse 1  MEWS RR 1  MEWS LOC 0  MEWS Score 2  MEWS Score Color Yellow  Assess: if the MEWS score is Yellow or Red  Were vital signs taken at a resting state? Yes  Focused Assessment Change from prior assessment (see assessment flowsheet)  Does the patient meet 2 or more of the SIRS criteria? No  MEWS guidelines implemented  Yes, yellow  Treat  MEWS Interventions Considered administering scheduled or prn medications/treatments as ordered  Take Vital Signs  Increase Vital Sign Frequency  Yellow: Q2hr x1, continue Q4hrs until patient remains green for 12hrs  Escalate  MEWS: Escalate Yellow: Discuss with charge nurse and consider notifying provider and/or RRT  Notify: Charge Nurse/RN  Name of Charge Nurse/RN Notified Scientist, forensic  Provider Notification  Provider Name/Title Mapp MD  Date Provider Notified 12/04/22  Time Provider Notified 0205  Method of Notification Page  Notification Reason Change in status  Provider response En route  Date of Provider Response 12/04/22  Time of Provider Response 0207  Assess: SIRS CRITERIA  SIRS Temperature  0  SIRS Pulse 1  SIRS Respirations  1  SIRS WBC 0  SIRS Score Sum  2

## 2022-12-04 NOTE — Progress Notes (Addendum)
Subjective: Paged by nursing staff for wheezing. Our team went to reassess the patient.   Patient reports that his cough has become more frequent today.  He reports that he is coughing up clear sputum.  He is also noticed some wheezing which is new.  He feels as though he may be getting a bit confused while in the hospital.  Otherwise has no new complaints.  Objective: Constitutional: laying in bed, resting comfortably, pleasant Cardiovascular: Tachycardic, irregularly irregular rhythm. No murmurs, rubs, or gallops. Normal radial and PT pulses bilaterally. 1+ bilateral LE edema.  Pulmonary: Normal respiratory effort. No  rales or rhonchi. Diffuse expiratory wheezing throughout all lung fields. Mild crackles at bilateral bases.     Neurological: Alert and oriented to person, place, and time. Non-focal. Skin: warm and dry.    Plan: Patient is satting > 95% on RA. He remains afebrile. Most recent CBC from yesterday without leukocytosis. Negative for COVID, RSV, influenza. No focal lung findings on CXR or exam to suggest developing pneumonia.  Low suspicion for pneumonia at this time.  Does have atelectasis noted on CXR attributed to his HFmrEF.  Does have some mild crackles at the bases on exam which is consistent with fluid accumulation. Patient is tachycardic b/t 100-120, however was admitted for A-fib with RVR.  Wells score is 1.5.  Low suspicion for pulmonary embolism at this time. On exam, has diffuse wheezing throughout all lung fields. No documented history of asthma or COPD.  No prior PFTs on file.  However, suspect the patient would benefit from DuoNebs given his wheezing.  Will administer DuoNebs judiciously given his tachycardia. -Delirium precautions for confusion -Continue to reassess for signs of developing pneumonia -Trend CBC and fever curve -Limited PRN Duonebs q6 hours -IV metoprolol 5 mg q2 hours PRN for sustained HR > 130 -Outpatient PFTs

## 2022-12-04 NOTE — TOC Benefit Eligibility Note (Signed)
Patient Teacher, English as a foreign language completed.    The patient is currently admitted and upon discharge could be taking Jardiance 10 mg.  The current 30 day co-pay is $583.19 due to a $4,500.00 deductible.   The patient is currently admitted and upon discharge could be taking Farxiga 10 mg.  Product not on formulary  The patient is insured through [place], [place] [facility] [place] Patient Advocate Team Direct Number: [phone]  Fax: [phone]

## 2022-12-04 NOTE — Progress Notes (Signed)
Subjective:   Summary: Jonathan Gonzalez is a 62 y.o. year old male currently admitted on the IMTS HD#0 for HTN, HLD, T2DM, and atrial fibrillation on eliquis who presents with subacute progressive SHOB with exertion and palpitations.   Overnight Events: Patient reported a new cough with clear sputum production and wheezing. He thinks he may be feeling confused, and associates it with being in the hospital.   He denies chest pain at this time.   Objective:  Vital signs in last 24 hours: Vitals:   12/04/22 0832 12/04/22 0902 12/04/22 1037 12/04/22 1136  BP: (!) 136/111 (!) 141/111 108/82 120/81  Pulse: (!) 106 (!) 122 95 91  Resp:    20  Temp: 98 F (36.7 C)  97.9 F (36.6 C) 98.3 F (36.8 C)  TempSrc: Oral  Oral Oral  SpO2: 100%  93% 98%  Weight:      Height:       Supplemental O2: Nasal Cannula SpO2: 98 % O2 Flow Rate (L/min): 3 L/min   Physical Exam:  Constitutional: sitting in bed, in no acute distress HEENT: enlarged salivary glands Cardiovascular:  tachycardic irregularly irregular, no murmurs, rubs or gallops Pulmonary/Chest: laborious breathing on nasal cannula, expiratory wheezing appreciated Abdominal: soft, non-tender, non-distended Skin: warm and dry Extremities: upper/lower extremity pulses 2+, no lower extremity edema present  [facility] Weights   12/03/22 0544 12/04/22 0332  Weight: 104.3 kg 110.1 kg     Intake/Output Summary (Last 24 hours) at 12/04/2022 1427 Last data filed at 12/04/2022 1242 Gross per 24 hour  Intake 50.46 ml  Output 3725 ml  Net -3674.54 ml   Net IO Since Admission: -3,674.54 mL [12/04/22 1427]  Pertinent Labs:    Latest Ref Rng & Units 12/04/2022    2:11 AM 12/03/2022    5:43 AM 09/18/2022    8:42 AM  CBC  WBC 4.0 - 10.5 K/uL 10.9  9.5  10.5   Hemoglobin 13.0 - 17.0 g/dL 11.9  11.0  14.8   Hematocrit 39.0 - 52.0 % 39.4  36.5  47.0   Platelets 150 - 400 K/uL 401  335  388        Latest Ref Rng & Units  12/04/2022    2:11 AM 12/03/2022    5:43 AM 09/18/2022    8:42 AM  CMP  Glucose 70 - 99 mg/dL 118  120  91   BUN 8 - 23 mg/dL 18  11  27    Creatinine 0.61 - 1.24 mg/dL 1.29  0.86  1.50   Sodium 135 - 145 mmol/L 136  138  135   Potassium 3.5 - 5.1 mmol/L 3.8  3.5  3.9   Chloride 98 - 111 mmol/L 103  106  99   CO2 22 - 32 mmol/L 24  23  24    Calcium 8.9 - 10.3 mg/dL 9.1  8.7  9.2   Total Protein 6.5 - 8.1 g/dL 6.8     Total Bilirubin 0.3 - 1.2 mg/dL 1.2     Alkaline Phos 38 - 126 U/L 65     AST 15 - 41 U/L 37     ALT 0 - 44 U/L 37       I personally reviewed interval labs and pertinent results include: leukocytosis, anemia, thrombocytosis, elevated Cr. Iron studies show iron and ferritin  are low - c/f iron deficiency anemia.  Assessment/Plan:   Principal  Problem:   Atrial fibrillation with RVR (HCC) Active Problems:   DM (diabetes mellitus) (HCC)   HTN (hypertension)   HLD (hyperlipidemia)   Heart failure with mildly reduced ejection fraction (HFmrEF) ([place])   Patient Summary: Jonathan Gonzalez is a 62 y.o. year old male currently admitted on the IMTS HD#0 for HTN, HLD, T2DM, and atrial fibrillation on eliquis who presents with subacute progressive SHOB with exertion and palpitations and admitted for Atrial Fibrillation and HFrEF exacerbation.    # Atrial fibrillation with RVR #HFrEF exacerbation likely 2/2 AF He reports no chest pain or palpitations overnight. At bedside, he seems as if he is rushing for his next breath of air. Audible cardiac wheezing is noted. CXR did not show any opacities c/f pneumonia, but will continue to monitor his fever curve and CBC for infectious etiology of wheezing. His current pulse is 91 bpm with occasional spikes in the 120-130s. He seems more rate controlled on the metoprolol tartrate. He is on telemetry to monitor for cardiac arrhythmia. He is also on eliquis due to his risk of clotting with an A fib diagnosis.   For his HFrEF he is on lasix and  farxiga. Patient's insurance changed such that his prescription would be $200/month which patient cannot afford. Need to find an affordable alternative. He was also on lisinopril, which is held in the setting of AKI (Cr: 1.29). Would like to transition to ARB, so he needs a washout period from previous ACEi. He is having a cardioversion Thursday for rhythm control of his A fib.  - strict I&O - metoprolol for rate control - eliquis for anticoagulant - telemetry  - farxiga - lasix - appreciate cardio recs, pending cardioversion on Thursday 3/21  #Microcytic Anemia/Iron Deficiency His iron studies  show decreased ferritin and serum iron. He is suspected to have iron deficiency anemia. A new diagnosis of iron deficiency anemia in an older man is c/f colon cancer. He said his last colonoscopy was 20 years ago. He denies any blood in his stool at this time, but he could benefit from an outpatient colonoscopy follow up.   #T2DM - SSI   #HTN He is normotensive at last vital signs at 11:36 AM (120/81). Will continue his metoprolol, which is also being used for his rate control for A fib - metoprolol  #HLD - continue liptor  #AKI Discontinued his lisinopril due to creatinine bump of 1.29. Will trend CMP to monitor his creatinine levels and renal functioning. Patient had no complaints of urinary difficulty or blood in urine. His I&Os show a net of -3674 mL, no concern for anuria at this time.   #R Swollen Submandibular mass On exam, patient was found to have a non-tender, enlarged mass under his mandible. 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 Per UpToDate literature, autoimmune diseases including diabetes are risk factors for sialadenitis. When asked about associated symptoms, he denied pain with eating or dry mouth. Of note, the chronicity of this non-tender mass is unknown, and given his age (54s) could be a sign of lymphoma. Could benefit from outpatient f/u.    Diet:  Heart Healthy IVF: None VTE: NOAC Code: Full   Katharine Look

## 2022-12-04 NOTE — Progress Notes (Incomplete)
   Heart Failure Stewardship Pharmacist Progress Note   PCP: Gildardo Pounds, NP PCP-Cardiologist: Candee Furbish, MD    HPI:  62 yo M with PMH of T2DM, HTN, HLD, obestiy, and afib.  Presented to the ED on 3/18 with shortness of breath, orthopnea, LE edema, and tachycardia. In afib RVR on admission, started on diltiazem gtt. CXR with atelectasis. An ECHO was done 3/18 and showed LVEF reduced to 35-40% (was 45-50% in Dec 2023), global hypokinesis, and RV mild reduced. Diltiazem drip stopped. Plan TEE/DCCV on Thursday.  Current HF Medications: Diuretic: furosemide 80 mg IV BID Beta Blocker: metoprolol tartrate 100 mg BID SGLT2i: Farxiga 10 mg daily  Prior to admission HF Medications: ACE/ARB/ARNI: lisinopril 20 mg daily SGLT2i: Farxiga 5 mg daily  Pertinent Lab Values: Serum creatinine 1.24, BUN 22, Potassium 3.2, Sodium 135, BNP 223.6, Magnesium 2.0  Vital Signs: Weight: 234 lbs (admission weight: 242 lbs) Blood pressure: 120/80s  Heart rate: 100s  I/O: -3.5L yesterday; net -4.5L  Medication Assistance / Insurance Benefits Check: Does the patient have prescription insurance?  Yes, high deductible commercial insurance plan  Does the patient qualify for medication assistance through manufacturers or grants?   Yes Eligible grants and/or patient assistance programs: Jardiance Medication assistance applications in progress: [employer] Medication assistance applications approved: none Approved medication assistance renewals will be completed by: pending  Outpatient Pharmacy:  Prior to admission outpatient pharmacy: [facility] Is the patient willing to use [facility] at discharge? Yes  Assessment: 1. Acute systolic CHF (LVEF 123456). NYHA class III symptoms. - Continue furosemide 80 mg IV BID. Strict I/Os and daily weights. Keep K>4 and Mg>2. KCl 40 mEq x 2 given for replacement.  - Continue metoprolol tartrate 100 mg BID - will need to consolidate to succinate  for discharge - Entresto started today per cardiology but discontinued by primary team. Consider resuming as BP likely will tolerate the addition of Entresto and this will optimize his GDMT for new HFrEF - Consider adding MRA prior to discharge - Consider transitioning Farxiga to Jardiance 10 mg daily for access standpoint    Plan: 1) Medication changes recommended at this time: - Start Entresto 24/26 mg BID - Stop Wilder Glade and start Jardiance 10 mg daily  2) Patient assistance: - Has high deductible commercial plan.  - Farxiga not on formulary - Jardiance copay card only covers up to $175 per month - will need additional assistance - Entresto copay is $590 until deductible met - copay card can be used to lower copay to $10 and will work toward paying deductible - Patient assistance applications for Jardiance initiated   3)  Education  - Patient has been educated on current HF medications and potential additions to HF medication regimen - Patient verbalizes understanding that over the next few months, these medication doses may change and more medications may be added to optimize HF regimen - Patient has been educated on basic disease state pathophysiology and goals of therapy  Kerby Nora, PharmD, BCPS Heart Failure Cytogeneticist Phone [phone]

## 2022-12-05 ENCOUNTER — Encounter (HOSPITAL_COMMUNITY): Payer: Self-pay | Admitting: Internal Medicine

## 2022-12-05 ENCOUNTER — Other Ambulatory Visit (HOSPITAL_COMMUNITY): Payer: Self-pay

## 2022-12-05 DIAGNOSIS — I4891 Unspecified atrial fibrillation: Secondary | ICD-10-CM | POA: Diagnosis not present

## 2022-12-05 LAB — GLUCOSE, CAPILLARY
Glucose-Capillary: 81 mg/dL (ref 70–99)
Glucose-Capillary: 145 mg/dL — ABNORMAL HIGH (ref 70–99)
Glucose-Capillary: 79 mg/dL (ref 70–99)
Glucose-Capillary: 116 mg/dL — ABNORMAL HIGH (ref 70–99)

## 2022-12-05 LAB — CBC WITH DIFFERENTIAL/PLATELET
Abs Immature Granulocytes: 0.05 10*3/uL (ref 0.00–0.07)
Basophils Absolute: 0.1 10*3/uL (ref 0.0–0.1)
Basophils Relative: 1 %
Eosinophils Absolute: 0.2 10*3/uL (ref 0.0–0.5)
Eosinophils Relative: 2 %
HCT: 39.5 % (ref 39.0–52.0)
Hemoglobin: 12.1 g/dL — ABNORMAL LOW (ref 13.0–17.0)
Immature Granulocytes: 1 %
Lymphocytes Relative: 17 %
Lymphs Abs: 1.7 10*3/uL (ref 0.7–4.0)
MCH: 22.1 pg — ABNORMAL LOW (ref 26.0–34.0)
MCHC: 30.6 g/dL (ref 30.0–36.0)
MCV: 72.1 fL — ABNORMAL LOW (ref 80.0–100.0)
Monocytes Absolute: 1 10*3/uL (ref 0.1–1.0)
Monocytes Relative: 10 %
Neutro Abs: 7 10*3/uL (ref 1.7–7.7)
Neutrophils Relative %: 69 %
Platelets: 375 10*3/uL (ref 150–400)
RBC: 5.48 MIL/uL (ref 4.22–5.81)
RDW: 17.7 % — ABNORMAL HIGH (ref 11.5–15.5)
WBC: 10.1 10*3/uL (ref 4.0–10.5)
nRBC: 0 % (ref 0.0–0.2)

## 2022-12-05 LAB — BASIC METABOLIC PANEL
Anion gap: 9 (ref 5–15)
BUN: 22 mg/dL (ref 8–23)
CO2: 29 mmol/L (ref 22–32)
Calcium: 8.9 mg/dL (ref 8.9–10.3)
Chloride: 97 mmol/L — ABNORMAL LOW (ref 98–111)
Creatinine, Ser: 1.24 mg/dL (ref 0.61–1.24)
GFR, Estimated: >60 mL/min (ref >60)
Glucose, Bld: 87 mg/dL (ref 70–99)
Potassium: 3.2 mmol/L — ABNORMAL LOW (ref 3.5–5.1)
Sodium: 135 mmol/L (ref 135–145)

## 2022-12-05 MED ORDER — SACUBITRIL-VALSARTAN 24-26 MG PO TABS: 1.0000 | ORAL_TABLET | Freq: Two times a day (BID) | ORAL | Status: DC

## 2022-12-05 MED ORDER — SACUBITRIL-VALSARTAN 24-26 MG PO TABS
1.0000 | ORAL_TABLET | Freq: Two times a day (BID) | ORAL | Status: DC
  Administered 2022-12-05 – 2022-12-06 (×2): 1 via ORAL
  Filled 2022-12-05 (×2): qty 1

## 2022-12-05 MED ORDER — POTASSIUM CHLORIDE CRYS ER 20 MEQ PO TBCR
40.0000 meq | EXTENDED_RELEASE_TABLET | Freq: Two times a day (BID) | ORAL | Status: AC
  Administered 2022-12-05 (×2): 40 meq via ORAL
  Filled 2022-12-05 (×2): qty 2

## 2022-12-05 NOTE — H&P (View-Only) (Signed)
Rounding Note    Patient Name: Jonathan Gonzalez Date of Encounter: 12/05/2022  [place] Cardiologist: Candee Furbish, MD   Subjective   Looks much better today from a breathing standpoint. Off [place] O2. Gives more details regarding his living situation.   Inpatient Medications    Scheduled Meds:  apixaban  5 mg Oral BID   atorvastatin  10 mg Oral Daily   dapagliflozin propanediol  10 mg Oral QAC breakfast   furosemide  80 mg Intravenous BID   insulin aspart  0-15 Units Subcutaneous TID WC   metoprolol tartrate  100 mg Oral BID   potassium chloride  40 mEq Oral BID   Continuous Infusions:  PRN Meds: acetaminophen **OR** acetaminophen, ipratropium-albuterol, levalbuterol, metoprolol tartrate, polyethylene glycol   Vital Signs    Vitals:   12/05/22 0025 12/05/22 0427 12/05/22 0618 12/05/22 0750  BP: 115/86 (!) 126/95 124/87 (!) 126/98  Pulse: 99 95 (!) 103 99  Resp: 19 16 16 17   Temp: 98.2 F (36.8 C) 97.9 F (36.6 C) 97.8 F (36.6 C) 98.8 F (37.1 C)  TempSrc: Oral Oral Oral Oral  SpO2: 95% 94% 90% 95%  Weight:   106.3 kg   Height:        Intake/Output Summary (Last 24 hours) at 12/05/2022 0928 Last data filed at 12/04/2022 1956 Gross per 24 hour  Intake 710 ml  Output 3600 ml  Net -2890 ml      12/05/2022    6:18 AM 12/04/2022    3:32 AM 12/03/2022    5:44 AM  Last 3 Weights  Weight (lbs) 234 lb 6.4 oz 242 lb 12.8 oz 230 lb  Weight (kg) 106.323 kg 110.133 kg 104.327 kg      Telemetry    Afib in the 120s - Personally Reviewed  ECG    No new tracings - Personally Reviewed  Physical Exam   GEN: No acute distress.   Neck: No JVD - exam difficult Cardiac: irregular rhythm, tachycardic rate  Respiratory: wheezing improved, crackles in bases. GI: Soft, nontender, non-distended  MS: minimal edema Neuro:  Nonfocal  Psych: Normal affect   Labs    High Sensitivity Troponin:  No results for input(s): "TROPONINIHS" in the last 720 hours.    Chemistry Recent Labs  Lab 12/03/22 0543 12/04/22 0211 12/05/22 0159  NA 138 136 135  K 3.5 3.8 3.2*  CL 106 103 97*  CO2 23 24 29   GLUCOSE 120* 118* 87  BUN 11 18 22   CREATININE 0.86 1.29* 1.24  CALCIUM 8.7* 9.1 8.9  MG 2.0  --   --   PROT  --  6.8  --   ALBUMIN  --  3.5  --   AST  --  37  --   ALT  --  37  --   ALKPHOS  --  65  --   BILITOT  --  1.2  --   GFRNONAA >60 >60 >60  ANIONGAP 9 9 9     Lipids No results for input(s): "CHOL", "TRIG", "HDL", "LABVLDL", "LDLCALC", "CHOLHDL" in the last 168 hours.  Hematology Recent Labs  Lab 12/03/22 0543 12/03/22 1606 12/04/22 0211 12/05/22 0159  WBC 9.5  --  10.9* 10.1  RBC 4.88 4.85 5.36 5.48  HGB 11.0*  --  11.9* 12.1*  HCT 36.5*  --  39.4 39.5  MCV 74.8*  --  73.5* 72.1*  MCH 22.5*  --  22.2* 22.1*  MCHC 30.1  --  30.2  30.6  RDW 17.9*  --  18.3* 17.7*  PLT 335  --  401* 375   Thyroid  Recent Labs  Lab 12/03/22 0543  TSH 1.797    BNP Recent Labs  Lab 12/03/22 0543  BNP 223.6*    DDimer No results for input(s): "DDIMER" in the last 168 hours.   Radiology    DG Chest 2 View  Result Date: 12/04/2022 CLINICAL DATA:  Dyspnea EXAM: CHEST - 2 VIEW COMPARISON:  Previous studies including the examination of 12/03/2022 FINDINGS: Cardiac size is within normal limits. There is poor inspiration. There are no signs of pulmonary edema. Left hemidiaphragm is elevated. There are linear densities in both lower lung fields. Left lateral CP angle is indistinct. There is no pneumothorax. IMPRESSION: Increased markings in the lower lung fields may suggest subsegmental atelectasis. No significant interval changes are noted in the lung fields. Electronically Signed   By: Elmer Picker M.D.   On: 12/04/2022 13:15   ECHOCARDIOGRAM COMPLETE  Result Date: 12/03/2022    ECHOCARDIOGRAM REPORT   Patient Name:   Jonathan Gonzalez Date of Exam: 12/03/2022 Medical Rec #:  [id number]        Height:       68.0 in Accession #:     [id number]       Weight:       230.0 lb Date of Birth:  [birth year]        BSA:          2.169 m Patient Age:    62 years         BP:           104/79 mmHg Patient Gender: M                HR:           71 bpm. Exam Location:  Inpatient Procedure: 2D Echo, Cardiac Doppler, Color Doppler and Intracardiac            Opacification Agent Indications:    I48.91* Unspeicified atrial fibrillation  History:        Patient has prior history of Echocardiogram examinations, most                 recent 09/15/2022. Arrythmias:Atrial Fibrillation; Risk                 Factors:Hypertension, Dyslipidemia and Diabetes.  Sonographer:    Bernadene Person RDCS Referring Phys: [id number] Velna Ochs  Sonographer Comments: Patient is obese and Technically difficult study due to poor echo windows. Pt sitting up and coughing throughout exam. IMPRESSIONS  1. Left ventricular ejection fraction, by estimation, is 35 to 40%. The left ventricle has moderately decreased function. The left ventricle demonstrates global hypokinesis. Left ventricular diastolic function could not be evaluated.  2. Right ventricular systolic function is mildly reduced. The right ventricular size is normal. There is normal pulmonary artery systolic pressure. The estimated right ventricular systolic pressure is Q000111Q mmHg.  3. Left atrial size was mildly dilated.  4. The mitral valve is abnormal. Moderate mitral valve regurgitation. No evidence of mitral stenosis.  5. Tricuspid valve regurgitation is mild to moderate.  6. The aortic valve is tricuspid. Aortic valve regurgitation is not visualized. No aortic stenosis is present.  7. The inferior vena cava is dilated in size with >50% respiratory variability, suggesting right atrial pressure of 8 mmHg. Comparison(s): Changes from prior study are noted. The left ventricular function is worsened. FINDINGS  Left Ventricle: Left  ventricular ejection fraction, by estimation, is 35 to 40%. The left ventricle has moderately  decreased function. The left ventricle demonstrates global hypokinesis. Definity contrast agent was given IV to delineate the left ventricular endocardial borders. The left ventricular internal cavity size was normal in size. There is no left ventricular hypertrophy. Left ventricular diastolic function could not be evaluated due to atrial fibrillation. Left ventricular diastolic function could not be evaluated. Right Ventricle: The right ventricular size is normal. No increase in right ventricular wall thickness. Right ventricular systolic function is mildly reduced. There is normal pulmonary artery systolic pressure. The tricuspid regurgitant velocity is 2.62 m/s, and with an assumed right atrial pressure of 8 mmHg, the estimated right ventricular systolic pressure is Q000111Q mmHg. Left Atrium: Left atrial size was mildly dilated. Right Atrium: Right atrial size was normal in size. Pericardium: There is no evidence of pericardial effusion. Mitral Valve: The mitral valve is abnormal. Moderate mitral valve regurgitation. No evidence of mitral valve stenosis. Tricuspid Valve: The tricuspid valve is grossly normal. Tricuspid valve regurgitation is mild to moderate. No evidence of tricuspid stenosis. Aortic Valve: The aortic valve is tricuspid. Aortic valve regurgitation is not visualized. No aortic stenosis is present. Pulmonic Valve: The pulmonic valve was grossly normal. Pulmonic valve regurgitation is trivial. No evidence of pulmonic stenosis. Aorta: The aortic root and ascending aorta are structurally normal, with no evidence of dilitation. Venous: The inferior vena cava is dilated in size with greater than 50% respiratory variability, suggesting right atrial pressure of 8 mmHg. IAS/Shunts: The atrial septum is grossly normal.  LEFT VENTRICLE PLAX 2D LVIDd:         4.70 cm LVIDs:         4.10 cm LV PW:         0.80 cm LV IVS:        1.10 cm LVOT diam:     1.80 cm LV SV:         33 LV SV Index:   15 LVOT Area:     2.54  cm  LV Volumes (MOD) LV vol d, MOD A2C: 122.0 ml LV vol d, MOD A4C: 96.3 ml LV vol s, MOD A2C: 61.5 ml LV vol s, MOD A4C: 60.7 ml LV SV MOD A2C:     60.5 ml LV SV MOD A4C:     96.3 ml LV SV MOD BP:      44.6 ml RIGHT VENTRICLE RV S prime:     7.14 cm/s TAPSE (M-mode): 1.1 cm LEFT ATRIUM             Index        RIGHT ATRIUM           Index LA diam:        4.20 cm 1.94 cm/m   RA Area:     17.70 cm LA Vol (A2C):   66.1 ml 30.48 ml/m  RA Volume:   45.60 ml  21.03 ml/m LA Vol (A4C):   57.7 ml 26.60 ml/m LA Biplane Vol: 63.4 ml 29.23 ml/m  AORTIC VALVE LVOT Vmax:   83.60 cm/s LVOT Vmean:  48.433 cm/s LVOT VTI:    0.130 m  AORTA Ao Root diam: 3.00 cm Ao Asc diam:  3.60 cm MR Peak grad:    70.9 mmHg    TRICUSPID VALVE MR Mean grad:    50.5 mmHg    TR Peak grad:   27.5 mmHg MR Vmax:         421.00  cm/s  TR Vmax:        262.00 cm/s MR Vmean:        337.5 cm/s MR PISA:         1.01 cm     SHUNTS MR PISA Eff ROA: 10 mm       Systemic VTI:  0.13 m MR PISA Radius:  0.40 cm      Systemic Diam: 1.80 cm Eleonore Chiquito MD Electronically signed by Eleonore Chiquito MD Signature Date/Time: 12/03/2022/11:40:11 AM    Final     Cardiac Studies   Echo 12/03/22:  1. Left ventricular ejection fraction, by estimation, is 35 to 40%. The  left ventricle has moderately decreased function. The left ventricle  demonstrates global hypokinesis. Left ventricular diastolic function could  not be evaluated.   2. Right ventricular systolic function is mildly reduced. The right  ventricular size is normal. There is normal pulmonary artery systolic  pressure. The estimated right ventricular systolic pressure is Q000111Q mmHg.   3. Left atrial size was mildly dilated.   4. The mitral valve is abnormal. Moderate mitral valve regurgitation. No  evidence of mitral stenosis.   5. Tricuspid valve regurgitation is mild to moderate.   6. The aortic valve is tricuspid. Aortic valve regurgitation is not  visualized. No aortic stenosis is present.    7. The inferior vena cava is dilated in size with >50% respiratory  variability, suggesting right atrial pressure of 8 mmHg.   Patient Profile     62 y.o. male with a hx of atrial fibrillation, HFmrEF, hypertension, hyperlipidemia, DM type II, who is being seen 12/03/2022 for the evaluation of afib with RVR.   Assessment & Plan    Atrial fibrillation with RVR PAF - He presented with A-fib with RVR in the setting of running out of his metoprolol and missed doses of Eliquis - He canceled planned outpatient cardioversion due to his wife passing - He is poorly rate controlled with 100 mg metoprolol twice daily - Planning for TEE guided cardioversion tomorrow 12/06/2022 at [place] has been requested to perform a transesophageal echocardiogram on Jonathan Gonzalez for atrial fibrillation.  After careful review of history and examination, the risks and benefits of transesophageal echocardiogram have been explained including risks of esophageal damage, perforation (1:10,000 risk), bleeding, pharyngeal hematoma as well as other potential complications associated with conscious sedation including aspiration, arrhythmia, respiratory failure and death. Alternatives to treatment were discussed, questions were answered. Patient is willing to proceed.    Chronic anticoagulation He [facility], has missed doses.  No signs of active bleeding - continue eliquis 5 mg BID   Acute on chronic systolic heart failure Hypertension Echocardiogram this admission with further reduced EF from 45-50% to 35-40%.  Suspect RVR likely contributory.  He did admit to dietary indiscretion. - PTA; farxiga, lisinopril, lopressor - diuresing on 80 mg IV lasix BID - CXR yesterday afternoon appears improved - holding lisinopril - start entresto today - diuresing well, looks much better today, off O2 - walking the halls well - renal function stable -  thankfully, AHF [facility] clinic team is involved to  help with resources and medications - will start patient assistance for entresto and jardiance   Disposition The patient's wife died earlier this month and he has been completing burial arrangements.  Following this event, he is now taking care of his adult autistic son and they are living in a hotel  room.  He can no longer afford [place].  He has admitted to dietary indiscretion given the recent life stressors.  He states that he is able to cook meals in their hotel room and is not eating fast food.  He understands that he needs to follow a low-sodium diet.  He may be moving to [place] to live with his sister for better social support.  Unclear when this will take place.        For questions or updates, please contact [place] Please consult [email or web address] for contact info under        Signed, Ledora Bottcher, PA  12/05/2022, 9:28 AM    Personally seen and examined. Agree with above.  Seems to have made a remarkable turnaround clinically from a shortness of breath standpoint.  Ambulating the hallway more comfortably.   Echo EF 35 to 40%  Heart irregularly irregular tachycardic still with atrial fibrillation on telemetry personally reviewed with rapid ventricular response.  Plan is n.p.o. past midnight for TEE cardioversion tomorrow.  Hopefully if we restore normal rhythm his ejection fraction will improve.  Starting [facility] today secondary to reduced ejection fraction.  Working with social work secondary to social determinants of health.  Candee Furbish, MD

## 2022-12-05 NOTE — Progress Notes (Addendum)
Subjective:   Summary: Jonathan Gonzalez is a 62 y.o. year old male currently admitted on the IMTS HD#1 for Atrial Fibrillation and HFrEF exacerbation.  Overnight Events: NAEON   He reports very minimal coughing overnight with a little phlegm. Denies chest pain, SOB, palpitations, or urinary difficulty.  Objective:  Vital signs in last 24 hours: Vitals:   12/05/22 0025 12/05/22 0427 12/05/22 0618 12/05/22 0750  BP: 115/86 (!) 126/95 124/87 (!) 126/98  Pulse: 99 95 (!) 103 99  Resp: 19 16 16 17   Temp: 98.2 F (36.8 C) 97.9 F (36.6 C) 97.8 F (36.6 C) 98.8 F (37.1 C)  TempSrc: Oral Oral Oral Oral  SpO2: 95% 94% 90% 95%  Weight:   106.3 kg   Height:       Supplemental O2: Room Air SpO2: 95 %    Physical Exam:  Constitutional: sitting in chair comfortably, in no acute distress Cardiovascular: tachycardic irregularly irregular rhythm, no murmurs, rubs or gallops Pulmonary/Chest: lungs clear to auscultation bilaterally, no wheezing appreciated Skin: warm and dry Extremities: no lower extremity edema present  Filed Weights   12/03/22 0544 12/04/22 0332 12/05/22 0618  Weight: 104.3 kg 110.1 kg 106.3 kg     Intake/Output Summary (Last 24 hours) at 12/05/2022 1104 Last data filed at 12/04/2022 1956 Gross per 24 hour  Intake 355 ml  Output 2750 ml  Net -2395 ml   Net IO Since Admission: -4,464.54 mL [12/05/22 1104]  Pertinent Labs:    Latest Ref Rng & Units 12/05/2022    1:59 AM 12/04/2022    2:11 AM 12/03/2022    5:43 AM  CBC  WBC 4.0 - 10.5 K/uL 10.1  10.9  9.5   Hemoglobin 13.0 - 17.0 g/dL 12.1  11.9  11.0   Hematocrit 39.0 - 52.0 % 39.5  39.4  36.5   Platelets 150 - 400 K/uL 375  401  335        Latest Ref Rng & Units 12/05/2022    1:59 AM 12/04/2022    2:11 AM 12/03/2022    5:43 AM  CMP  Glucose 70 - 99 mg/dL 87  118  120   BUN 8 - 23 mg/dL 22  18  11    Creatinine 0.61 - 1.24 mg/dL 1.24  1.29  0.86   Sodium 135 - 145 mmol/L 135  136   138   Potassium 3.5 - 5.1 mmol/L 3.2  3.8  3.5   Chloride 98 - 111 mmol/L 97  103  106   CO2 22 - 32 mmol/L 29  24  23    Calcium 8.9 - 10.3 mg/dL 8.9  9.1  8.7   Total Protein 6.5 - 8.1 g/dL  6.8    Total Bilirubin 0.3 - 1.2 mg/dL  1.2    Alkaline Phos 38 - 126 U/L  65    AST 15 - 41 U/L  37    ALT 0 - 44 U/L  37      I personally reviewed interval labs and pertinent results include: anemia consistent with iron deficiency. Hypokalemic.   Imaging: DG Chest 2 View  Result Date: 12/04/2022 CLINICAL DATA:  Dyspnea EXAM: CHEST - 2 VIEW COMPARISON:  Previous studies including the examination of 12/03/2022 FINDINGS: Cardiac size is within normal limits. There is poor inspiration. There are no signs of pulmonary edema. Left hemidiaphragm is elevated. There are linear densities in  both lower lung fields. Left lateral CP angle is indistinct. There is no pneumothorax. IMPRESSION: Increased markings in the lower lung fields may suggest subsegmental atelectasis. No significant interval changes are noted in the lung fields. Electronically Signed   By: Elmer Picker M.D.   On: 12/04/2022 13:15     Assessment/Plan:   Principal Problem:   Atrial fibrillation with RVR (HCC) Active Problems:   DM (diabetes mellitus) (HCC)   HTN (hypertension)   HLD (hyperlipidemia)   Heart failure with mildly reduced ejection fraction (HFmrEF) ([place])   Patient Summary: Jonathan Gonzalez is a 62 y.o. year old male currently admitted on the IMTS HD#2 for HTN, HLD, T2DM, and atrial fibrillation on eliquis who presents with subacute progressive SHOB with exertion and palpitations and admitted for Atrial Fibrillation and HFrEF exacerbation.    #Atrial Fibrillation Patient reports no chest pain, N/V, or SOB at bedside exam. No cardiac wheezing were appreciated. Seems relatively rate controlled at 103 bpm on metoprolol, but has occasional spikes in the 120s. Anticipate improvement of rate control after continued  adherence to metoprolol - he previously stopped after running out of his dose. Appreciate PT ambulating him today to see if his dyspnea on exertion is resolved and to observe his rate control. He is on Eliquis for anticoagulation. Per cardiology consult, he is having a TEE guided cardioversion tomorrow 12/06/2022 at 1:45 PM for rhythm control.  - continue metoprolol - continue Eliquis - TEE guided cardioversion tomorrow 3/21 - PT ambulation support - telemetry  #Acute on Chronic HFrEF He is diuresing well - net output of -3165 mL over the last 24 hours on lasix. He does not appear volume overloaded. Will hold off on starting entresto due to BP of 102/85. Do not want patient to become hypotensive. He is also on [place]. Appreciate cardiology working with patient for [employer]. - continue farxiga - continue lasix - strict I&Os  - slowly titrate up GDMT as patient's BP can tolerate - replete K+ to keep potassium at >4  #AKI His Cr is 1.24 down from 1.29. Kidney insult seems to be resolving. No urinary difficulty or anuria.  - trend BMP  #T2DM He is euglycemic with glucose of 88 this morning. Can continue SSI.   #Iron Deficiency Anemia His hemoglobin is 12.1, which is slightly lower than normal. His iron studies demonstrates low iron and ferritin, consistent with iron deficiency anemia. No acute concerns at this time. Could benefit from outpatient f/u for a colonoscopy. After procedure can potentially start iron supplementation.   #HTN Patient was normotensive this morning. Continue metoprolol. Holding off on starting [place].  #HLD - continue liptor  Diet: Heart Healthy IVF: None VTE: Eliquis Code: Full   Katharine Look

## 2022-12-05 NOTE — Progress Notes (Signed)
CSW met with patient at bedside to introduce self and discuss [facility] needs. Patient shared that his wife recently passed away and he and his 63 yo son who is autistic reside in a motel. He shared that they have been living there for a few years. 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 Patient has no insurance and states unable to afford to purchase any insurance. He uses the [employer] bus and [employer] to get to work and MD appointments. CSW will explore Financial Counseling screening and follow up with insurance possibilities when patient comes to clinic for appointment. Patient grateful for the information and has CSW contact information if needed. Raquel Sarna, [place], [place]

## 2022-12-05 NOTE — Progress Notes (Signed)
Heart Failure Nurse Navigator Progress Note  PCP: Gildardo Pounds, NP PCP-Cardiologist: Marlou Porch Admission Diagnosis: Atrial fibrillation with rapid ventricular response, Acute on chronic systolic congestive heart failure.  Admitted from: Home via EMS  Presentation:   Jonathan Gonzalez presented with shortness of breath, wheezing, tachy in the 190's, IV Cared[place]m given by EMS, Patient was originally supposed to have a cardioversion done outpatient, however it was cancelled due to the recent death of his wife. On arrival BP 138/89, HR 108, BMI 34.97, BNP 223.6, EKG with Atrial fibrillation, Plan for TEE on 12/06/2022. CXR with likely bibasilar atelectasis.   Patient was educated on the sign and symptoms of heart failure, daily weights, when to call his doctor or go to the ED, Diet/ fluid restrictions ( reported he eats a bag of chips everyday and uses can goods) Education on taking all medications as prescribed ( reported he stopped taking a few of his medications due to cost, both pharmacy and CSW/ CSM are working to get meds to bedside/ ? Match/ and possibly Medicaid if qualified) Spoke about attending all medical appointments. Patient reported to using either [employer] or another form of [facility] transportation. ( CSW will be given patient a couple bus passes) Patient has concerns about his son, who is 55 years old and autistic, wondering if CSW/ CSM can provide him with any resources to help. He recently lost his wife and his sister is coming in from [place] to help with finding them a different place to live. Currently living in a hotel for the least 3 years. (  Has ability to cook, with a stove and fridge)Patient verbalized his understanding of education and very grateful for any help. A HF TOC appointment was scheduled for Friday, 01/04/2023 @ 12 noon, this is the first Friday he has off of work.   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  Clinical Course:  Past Medical History:  Diagnosis Date   Diabetes mellitus without  complication ([place])    Hyperlipidemia    Hypertension      Social History   Socioeconomic History   Marital status: Married    Spouse name: Not on file   Number of children: Not on file   Years of education: Not on file   Highest education level: Not on file  Occupational History   Not on file  Tobacco Use   Smoking status: Never   Smokeless tobacco: Never   Tobacco comments:    Never smoke 10/08/22  Vaping Use   Vaping Use: Never used  Substance and Sexual Activity   Alcohol use: Never   Drug use: Never   Sexual activity: Not Currently  Other Topics Concern   Not on file  Social History Narrative   Not on file   Social Determinants of Health   Financial Resource Strain: Not on file  Food Insecurity: No Food Insecurity (12/04/2022)   Hunger Vital Sign    Worried About Running Out of Food in the Last Year: Never true    Ran Out of Food in the Last Year: Never true  Transportation Needs: No Transportation Needs (12/04/2022)   PRAPARE - Hydrologist (Medical): No    Lack of Transportation (Non-Medical): No  Physical Activity: Not on file  Stress: Not on file  Social Connections: Not on file   Education Assessment and Provision:  Detailed education and instructions provided on heart failure disease management including the following:  Signs and symptoms of Heart Failure When to  call the physician Importance of daily weights Low sodium diet Fluid restriction Medication management Anticipated future follow-up appointments  Patient education given on each of the above topics.  Patient acknowledges understanding via teach back method and acceptance of all instructions.  Education Materials:  "Living Better With Heart Failure" Booklet, HF zone tool, & Daily Weight Tracker Tool.  Patient has scale at home: Yes Patient has pill box at home: Yes    High Risk Criteria for Readmission and/or Poor Patient Outcomes: Heart failure hospital  admissions (last 6 months): 1  No Show rate: 33% Difficult social situation: Yes, lives in hotel x 33yrs, wife just passed away, lives with 4 yr old autistic son.  Demonstrates medication adherence: No, due to costs Primary Language: English Literacy level: Reading, writing, and comprehension  Barriers of Care:   Medication compliance ( cost related) No Ins. Diet/ fluids/ daily weights ( eats chips everyday, can goods)  Transportation at times ( will use [facility] or [employer] transportation system) Continued HF education  Considerations/Referrals:   Referral made to Heart Failure Pharmacist Stewardship: Yes, costs, TOC meds, No Ins Referral made to Heart Failure CSW/NCM TOC: Yes, consulted with Kennyth Lose, bus pass, Medicaid application, potential housing assistance. Patient asking any services that can help his autistic son ( age 51)  Referral made to Heart & Vascular TOC clinic: Yes, 01/04/2023 @ 12 noon ( 1st Friday open to meet patient needs, he is off work on Friday's)   Items for Follow-up on DC/TOC: Medication compliance ( cost related, no Insurance Diet/ fluids/ daily weights, ( eats chips daily and canned goods) Transportation at times ( uses [facility] and other services in [place]) Continued HF Education   [employer], Copywriter, advertising, Clinical cytogeneticist Only

## 2022-12-05 NOTE — Progress Notes (Addendum)
Rounding Note    Patient Name: Jonathan Gonzalez Date of Encounter: 12/05/2022  [place] Cardiologist: Candee Furbish, MD   Subjective   Looks much better today from a breathing standpoint. Off [place] O2. Gives more details regarding his living situation.   Inpatient Medications    Scheduled Meds:  apixaban  5 mg Oral BID   atorvastatin  10 mg Oral Daily   dapagliflozin propanediol  10 mg Oral QAC breakfast   furosemide  80 mg Intravenous BID   insulin aspart  0-15 Units Subcutaneous TID WC   metoprolol tartrate  100 mg Oral BID   potassium chloride  40 mEq Oral BID   Continuous Infusions:  PRN Meds: acetaminophen **OR** acetaminophen, ipratropium-albuterol, levalbuterol, metoprolol tartrate, polyethylene glycol   Vital Signs    Vitals:   12/05/22 0025 12/05/22 0427 12/05/22 0618 12/05/22 0750  BP: 115/86 (!) 126/95 124/87 (!) 126/98  Pulse: 99 95 (!) 103 99  Resp: 19 16 16 17   Temp: 98.2 F (36.8 C) 97.9 F (36.6 C) 97.8 F (36.6 C) 98.8 F (37.1 C)  TempSrc: Oral Oral Oral Oral  SpO2: 95% 94% 90% 95%  Weight:   106.3 kg   Height:        Intake/Output Summary (Last 24 hours) at 12/05/2022 0928 Last data filed at 12/04/2022 1956 Gross per 24 hour  Intake 710 ml  Output 3600 ml  Net -2890 ml      12/05/2022    6:18 AM 12/04/2022    3:32 AM 12/03/2022    5:44 AM  Last 3 Weights  Weight (lbs) 234 lb 6.4 oz 242 lb 12.8 oz 230 lb  Weight (kg) 106.323 kg 110.133 kg 104.327 kg      Telemetry    Afib in the 120s - Personally Reviewed  ECG    No new tracings - Personally Reviewed  Physical Exam   GEN: No acute distress.   Neck: No JVD - exam difficult Cardiac: irregular rhythm, tachycardic rate  Respiratory: wheezing improved, crackles in bases. GI: Soft, nontender, non-distended  MS: minimal edema Neuro:  Nonfocal  Psych: Normal affect   Labs    High Sensitivity Troponin:  No results for input(s): "TROPONINIHS" in the last 720 hours.    Chemistry Recent Labs  Lab 12/03/22 0543 12/04/22 0211 12/05/22 0159  NA 138 136 135  K 3.5 3.8 3.2*  CL 106 103 97*  CO2 23 24 29   GLUCOSE 120* 118* 87  BUN 11 18 22   CREATININE 0.86 1.29* 1.24  CALCIUM 8.7* 9.1 8.9  MG 2.0  --   --   PROT  --  6.8  --   ALBUMIN  --  3.5  --   AST  --  37  --   ALT  --  37  --   ALKPHOS  --  65  --   BILITOT  --  1.2  --   GFRNONAA >60 >60 >60  ANIONGAP 9 9 9     Lipids No results for input(s): "CHOL", "TRIG", "HDL", "LABVLDL", "LDLCALC", "CHOLHDL" in the last 168 hours.  Hematology Recent Labs  Lab 12/03/22 0543 12/03/22 1606 12/04/22 0211 12/05/22 0159  WBC 9.5  --  10.9* 10.1  RBC 4.88 4.85 5.36 5.48  HGB 11.0*  --  11.9* 12.1*  HCT 36.5*  --  39.4 39.5  MCV 74.8*  --  73.5* 72.1*  MCH 22.5*  --  22.2* 22.1*  MCHC 30.1  --  30.2  30.6  RDW 17.9*  --  18.3* 17.7*  PLT 335  --  401* 375   Thyroid  Recent Labs  Lab 12/03/22 0543  TSH 1.797    BNP Recent Labs  Lab 12/03/22 0543  BNP 223.6*    DDimer No results for input(s): "DDIMER" in the last 168 hours.   Radiology    DG Chest 2 View  Result Date: 12/04/2022 CLINICAL DATA:  Dyspnea EXAM: CHEST - 2 VIEW COMPARISON:  Previous studies including the examination of 12/03/2022 FINDINGS: Cardiac size is within normal limits. There is poor inspiration. There are no signs of pulmonary edema. Left hemidiaphragm is elevated. There are linear densities in both lower lung fields. Left lateral CP angle is indistinct. There is no pneumothorax. IMPRESSION: Increased markings in the lower lung fields may suggest subsegmental atelectasis. No significant interval changes are noted in the lung fields. Electronically Signed   By: Elmer Picker M.D.   On: 12/04/2022 13:15   ECHOCARDIOGRAM COMPLETE  Result Date: 12/03/2022    ECHOCARDIOGRAM REPORT   Patient Name:   Jonathan Gonzalez Date of Exam: 12/03/2022 Medical Rec #:  [id number]        Height:       68.0 in Accession #:     [id number]       Weight:       230.0 lb Date of Birth:  [birth year]        BSA:          2.169 m Patient Age:    62 years         BP:           104/79 mmHg Patient Gender: M                HR:           71 bpm. Exam Location:  Inpatient Procedure: 2D Echo, Cardiac Doppler, Color Doppler and Intracardiac            Opacification Agent Indications:    I48.91* Unspeicified atrial fibrillation  History:        Patient has prior history of Echocardiogram examinations, most                 recent 09/15/2022. Arrythmias:Atrial Fibrillation; Risk                 Factors:Hypertension, Dyslipidemia and Diabetes.  Sonographer:    Bernadene Person RDCS Referring Phys: [id number] Velna Ochs  Sonographer Comments: Patient is obese and Technically difficult study due to poor echo windows. Pt sitting up and coughing throughout exam. IMPRESSIONS  1. Left ventricular ejection fraction, by estimation, is 35 to 40%. The left ventricle has moderately decreased function. The left ventricle demonstrates global hypokinesis. Left ventricular diastolic function could not be evaluated.  2. Right ventricular systolic function is mildly reduced. The right ventricular size is normal. There is normal pulmonary artery systolic pressure. The estimated right ventricular systolic pressure is Q000111Q mmHg.  3. Left atrial size was mildly dilated.  4. The mitral valve is abnormal. Moderate mitral valve regurgitation. No evidence of mitral stenosis.  5. Tricuspid valve regurgitation is mild to moderate.  6. The aortic valve is tricuspid. Aortic valve regurgitation is not visualized. No aortic stenosis is present.  7. The inferior vena cava is dilated in size with >50% respiratory variability, suggesting right atrial pressure of 8 mmHg. Comparison(s): Changes from prior study are noted. The left ventricular function is worsened. FINDINGS  Left Ventricle: Left  ventricular ejection fraction, by estimation, is 35 to 40%. The left ventricle has moderately  decreased function. The left ventricle demonstrates global hypokinesis. Definity contrast agent was given IV to delineate the left ventricular endocardial borders. The left ventricular internal cavity size was normal in size. There is no left ventricular hypertrophy. Left ventricular diastolic function could not be evaluated due to atrial fibrillation. Left ventricular diastolic function could not be evaluated. Right Ventricle: The right ventricular size is normal. No increase in right ventricular wall thickness. Right ventricular systolic function is mildly reduced. There is normal pulmonary artery systolic pressure. The tricuspid regurgitant velocity is 2.62 m/s, and with an assumed right atrial pressure of 8 mmHg, the estimated right ventricular systolic pressure is Q000111Q mmHg. Left Atrium: Left atrial size was mildly dilated. Right Atrium: Right atrial size was normal in size. Pericardium: There is no evidence of pericardial effusion. Mitral Valve: The mitral valve is abnormal. Moderate mitral valve regurgitation. No evidence of mitral valve stenosis. Tricuspid Valve: The tricuspid valve is grossly normal. Tricuspid valve regurgitation is mild to moderate. No evidence of tricuspid stenosis. Aortic Valve: The aortic valve is tricuspid. Aortic valve regurgitation is not visualized. No aortic stenosis is present. Pulmonic Valve: The pulmonic valve was grossly normal. Pulmonic valve regurgitation is trivial. No evidence of pulmonic stenosis. Aorta: The aortic root and ascending aorta are structurally normal, with no evidence of dilitation. Venous: The inferior vena cava is dilated in size with greater than 50% respiratory variability, suggesting right atrial pressure of 8 mmHg. IAS/Shunts: The atrial septum is grossly normal.  LEFT VENTRICLE PLAX 2D LVIDd:         4.70 cm LVIDs:         4.10 cm LV PW:         0.80 cm LV IVS:        1.10 cm LVOT diam:     1.80 cm LV SV:         33 LV SV Index:   15 LVOT Area:     2.54  cm  LV Volumes (MOD) LV vol d, MOD A2C: 122.0 ml LV vol d, MOD A4C: 96.3 ml LV vol s, MOD A2C: 61.5 ml LV vol s, MOD A4C: 60.7 ml LV SV MOD A2C:     60.5 ml LV SV MOD A4C:     96.3 ml LV SV MOD BP:      44.6 ml RIGHT VENTRICLE RV S prime:     7.14 cm/s TAPSE (M-mode): 1.1 cm LEFT ATRIUM             Index        RIGHT ATRIUM           Index LA diam:        4.20 cm 1.94 cm/m   RA Area:     17.70 cm LA Vol (A2C):   66.1 ml 30.48 ml/m  RA Volume:   45.60 ml  21.03 ml/m LA Vol (A4C):   57.7 ml 26.60 ml/m LA Biplane Vol: 63.4 ml 29.23 ml/m  AORTIC VALVE LVOT Vmax:   83.60 cm/s LVOT Vmean:  48.433 cm/s LVOT VTI:    0.130 m  AORTA Ao Root diam: 3.00 cm Ao Asc diam:  3.60 cm MR Peak grad:    70.9 mmHg    TRICUSPID VALVE MR Mean grad:    50.5 mmHg    TR Peak grad:   27.5 mmHg MR Vmax:         421.00  cm/s  TR Vmax:        262.00 cm/s MR Vmean:        337.5 cm/s MR PISA:         1.01 cm     SHUNTS MR PISA Eff ROA: 10 mm       Systemic VTI:  0.13 m MR PISA Radius:  0.40 cm      Systemic Diam: 1.80 cm Eleonore Chiquito MD Electronically signed by Eleonore Chiquito MD Signature Date/Time: 12/03/2022/11:40:11 AM    Final     Cardiac Studies   Echo 12/03/22:  1. Left ventricular ejection fraction, by estimation, is 35 to 40%. The  left ventricle has moderately decreased function. The left ventricle  demonstrates global hypokinesis. Left ventricular diastolic function could  not be evaluated.   2. Right ventricular systolic function is mildly reduced. The right  ventricular size is normal. There is normal pulmonary artery systolic  pressure. The estimated right ventricular systolic pressure is Q000111Q mmHg.   3. Left atrial size was mildly dilated.   4. The mitral valve is abnormal. Moderate mitral valve regurgitation. No  evidence of mitral stenosis.   5. Tricuspid valve regurgitation is mild to moderate.   6. The aortic valve is tricuspid. Aortic valve regurgitation is not  visualized. No aortic stenosis is present.    7. The inferior vena cava is dilated in size with >50% respiratory  variability, suggesting right atrial pressure of 8 mmHg.   Patient Profile     62 y.o. male with a hx of atrial fibrillation, HFmrEF, hypertension, hyperlipidemia, DM type II, who is being seen 12/03/2022 for the evaluation of afib with RVR.   Assessment & Plan    Atrial fibrillation with RVR PAF - He presented with A-fib with RVR in the setting of running out of his metoprolol and missed doses of Eliquis - He canceled planned outpatient cardioversion due to his wife passing - He is poorly rate controlled with 100 mg metoprolol twice daily - Planning for TEE guided cardioversion tomorrow 12/06/2022 at [facility] has been requested to perform a transesophageal echocardiogram on Jonathan Gonzalez for atrial fibrillation.  After careful review of history and examination, the risks and benefits of transesophageal echocardiogram have been explained including risks of esophageal damage, perforation (1:10,000 risk), bleeding, pharyngeal hematoma as well as other potential complications associated with conscious sedation including aspiration, arrhythmia, respiratory failure and death. Alternatives to treatment were discussed, questions were answered. Patient is willing to proceed.    Chronic anticoagulation He has been getting Eli[facility] from [facility], has missed doses.  No signs of active bleeding - continue eliquis 5 mg BID   Acute on chronic systolic heart failure Hypertension Echocardiogram this admission with further reduced EF from 45-50% to 35-40%.  Suspect RVR likely contributory.  He did admit to dietary indiscretion. - PTA; farxiga, lisinopril, lopressor - diuresing on 80 mg IV lasix BID - CXR yesterday afternoon appears improved - holding lisinopril - start entresto today - diuresing well, looks much better today, off O2 - walking the halls well - renal function stable -  thankfully, AHF Impact clinic team is involved to  help with resources and medications - will start patient assistance for entresto and jardiance   Disposition The patient's wife died earlier this month and he has been completing burial arrangements.  Following this event, he is now taking care of his adult autistic son and they are living in a hotel  room.  He can no longer afford [place].  He has admitted to dietary indiscretion given the recent life stressors.  He states that he is able to cook meals in their hotel room and is not eating fast food.  He understands that he needs to follow a low-sodium diet.  He may be moving to [place] to live with his sister for better social support.  Unclear when this will take place.        For questions or updates, please contact [place] Please consult [email or web address] for contact info under        Signed, Ledora Bottcher, PA  12/05/2022, 9:28 AM    Personally seen and examined. Agree with above.  Seems to have made a remarkable turnaround clinically from a shortness of breath standpoint.  Ambulating the hallway more comfortably.   Echo EF 35 to 40%  Heart irregularly irregular tachycardic still with atrial fibrillation on telemetry personally reviewed with rapid ventricular response.  Plan is n.p.o. past midnight for TEE cardioversion tomorrow.  Hopefully if we restore normal rhythm his ejection fraction will improve.  Starting [facility] today secondary to reduced ejection fraction.  Working with social work secondary to social determinants of health.  Candee Furbish, MD

## 2022-12-05 NOTE — Progress Notes (Signed)
Physical Therapy Treatment Patient Details Name: SLATON COURTER MRN: [id number] DOB: [birth year] Today's Date: 12/05/2022   History of Present Illness 62 y.o. male presents to [facility] hospital on 12/03/2022 with SOB, found to be in afib with RVR. PMH includes DM, HTN, obesity, afib.    PT Comments    Pt tolerates treatment well, ambulating for community distances and denying SOB or DOE. When pleth reads reliably pt sats in 90s. Pt has met all PT goals and requires no further PT services at this time. Acute PT signing off.   Recommendations for follow up therapy are one component of a multi-disciplinary discharge planning process, led by the attending physician.  Recommendations may be updated based on patient status, additional functional criteria and insurance authorization.  Follow Up Recommendations  No PT follow up     Assistance Recommended at Discharge None  Patient can return home with the following Assist for transportation   Equipment Recommendations  None recommended by PT    Recommendations for Other Services       Precautions / Restrictions Precautions Precautions: None Restrictions Weight Bearing Restrictions: No     Mobility  Bed Mobility Overal bed mobility: Independent                  Transfers Overall transfer level: Independent                      Ambulation/Gait Ambulation/Gait assistance: Independent Gait Distance (Feet): 800 Feet Assistive device: None Gait Pattern/deviations: WFL(Within Functional Limits) Gait velocity: functional Gait velocity interpretation: >2.62 ft/sec, indicative of community ambulatory   General Gait Details: steady step-through gait   Stairs             Wheelchair Mobility    Modified Rankin (Stroke Patients Only)       Balance Overall balance assessment: Independent                                          Cognition Arousal/Alertness: Awake/alert Behavior During  Therapy: WFL for tasks assessed/performed Overall Cognitive Status: Within Functional Limits for tasks assessed                                          Exercises      General Comments General comments (skin integrity, edema, etc.): VSS on RA, sats in 90s when plet reading reliably. Pt denies SOB or DOE with all activity      Pertinent Vitals/Pain Pain Assessment Pain Assessment: Faces Pain Score: 4  Pain Location: LLQ Pain Descriptors / Indicators: Cramping Pain Intervention(s): Monitored during session    Home Living                          Prior Function            PT Goals (current goals can now be found in the care plan section) Acute Rehab PT Goals Patient Stated Goal: to improve activity tolerance Additional Goals Additional Goal #1:  (goal met 3/20) Progress towards PT goals: Goals met/education completed, patient discharged from PT    Frequency     (d/c PT services)      PT Plan Current plan remains appropriate    Co-evaluation  AM-PAC PT "6 Clicks" Mobility   Outcome Measure  Help needed turning from your back to your side while in a flat bed without using bedrails?: None Help needed moving from lying on your back to sitting on the side of a flat bed without using bedrails?: None Help needed moving to and from a bed to a chair (including a wheelchair)?: None Help needed standing up from a chair using your arms (e.g., wheelchair or bedside chair)?: None Help needed to walk in hospital room?: None Help needed climbing 3-5 steps with a railing? : None 6 Click Score: 24    End of Session   Activity Tolerance: Patient tolerated treatment well Patient left: in bed;with call bell/phone within reach Nurse Communication: Mobility status PT Visit Diagnosis: Other abnormalities of gait and mobility (R26.89)     Time: [id number] PT Time Calculation (min) (ACUTE ONLY): 10 min  Charges:  $Gait Training: 8-22  mins                     Zenaida Niece, PT, DPT Acute Rehabilitation Office [phone]    Zenaida Niece 12/05/2022, 1:23 PM

## 2022-12-05 NOTE — Care Management (Signed)
12-05-22 1224 Case Manager received a secure chat the patient will need [place] assistance for medications. Case Manager completed [place] and medications will be filled via [place] once stable to transition home. No further needs identified at this time.

## 2022-12-05 NOTE — Progress Notes (Signed)
Mobility Specialist Progress Note:   12/05/22 1015  Mobility  Activity Ambulated independently in hallway  Level of Assistance Independent  Assistive Device None  Distance Ambulated (ft) 500 ft  Activity Response Tolerated well  Mobility Referral Yes  $Mobility charge 1 Mobility   Pt eager for mobility session. Ambulated independently on RA. No wheezing noted. SpO2 reading unreliable, however when pt stood still, SpO2 >90%. Pt left sitting in chair with all needs met, on RA.  Nelta Numbers Mobility Specialist Please contact via SecureChat or  Rehab office at [phone]

## 2022-12-05 NOTE — Anesthesia Preprocedure Evaluation (Signed)
Anesthesia Evaluation  Patient identified by MRN, date of birth, ID band Patient awake    Reviewed: Allergy & Precautions, NPO status , Patient's Chart, lab work & pertinent test results  Airway Mallampati: II  TM Distance: >3 FB Neck ROM: Full    Dental no notable dental hx. (+) Teeth Intact, Dental Advisory Given   Pulmonary    Pulmonary exam normal breath sounds clear to auscultation       Cardiovascular hypertension, Normal cardiovascular exam+ dysrhythmias Atrial Fibrillation  Rhythm:Regular Rate:Normal  12/03/2022 [facility] 1. Left ventricular ejection fraction, by estimation, is 35 to 40%. The  left ventricle has moderately decreased function. The left ventricle  demonstrates global hypokinesis. Left ventricular diastolic function could  not be evaluated.   2. Right ventricular systolic function is mildly reduced. The right  ventricular size is normal. There is normal pulmonary artery systolic  pressure. The estimated right ventricular systolic pressure is [id number] mmHg.   3. Left atrial size was mildly dilated.   4. The mitral valve is abnormal. Moderate mitral valve regurgitation. No  evidence of mitral stenosis.   5. Tricuspid valve regurgitation is mild to moderate.   6. The aortic valve is tricuspid. Aortic valve regurgitation is not  visualized. No aortic stenosis is present.   7. The inferior vena cava is dilated in size with >50% respiratory  variability, suggesting right atrial pressure of 8 mmHg.     Neuro/Psych    GI/Hepatic Neg liver ROS,,,  Endo/Other  diabetes, Type 2    Renal/GU Lab Results      Component                Value               Date                      CREATININE               1.24                12/05/2022                   K                        3.2 (L)             12/05/2022                     Musculoskeletal   Abdominal  (+) + obese (BMI 35.64)  Peds  Hematology Lab Results       Component                Value               Date                      WBC                      10.1                12/05/2022                HGB                      12.1 (L)            12/05/2022  HCT                      39.5                12/05/2022                         PLT                      375                 12/05/2022              Anesthesia Other Findings   Reproductive/Obstetrics                             Anesthesia Physical Anesthesia Plan  ASA: 3  Anesthesia Plan: MAC   Post-op Pain Management:    Induction: Intravenous  PONV Risk Score and Plan: Treatment may vary due to age or medical condition and Propofol infusion  Airway Management Planned: Nasal Cannula and Natural Airway  Additional Equipment: None  Intra-op Plan:   Post-operative Plan:   Informed Consent: I have reviewed the patients History and Physical, chart, labs and discussed the procedure including the risks, benefits and alternatives for the proposed anesthesia with the patient or authorized representative who has indicated his/her understanding and acceptance.     Dental advisory given  Plan Discussed with:   Anesthesia Plan Comments:         Anesthesia Quick Evaluation

## 2022-12-06 ENCOUNTER — Inpatient Hospital Stay (HOSPITAL_COMMUNITY): Payer: 59 | Admitting: Anesthesiology

## 2022-12-06 ENCOUNTER — Encounter (HOSPITAL_COMMUNITY): Payer: Self-pay | Admitting: Internal Medicine

## 2022-12-06 ENCOUNTER — Encounter (HOSPITAL_COMMUNITY)
Admission: EM | Disposition: A | Discharge status: Home / Self Care | Payer: Self-pay | Source: Home / Self Care | Attending: Internal Medicine

## 2022-12-06 ENCOUNTER — Other Ambulatory Visit (HOSPITAL_COMMUNITY): Payer: Self-pay

## 2022-12-06 ENCOUNTER — Inpatient Hospital Stay (HOSPITAL_COMMUNITY): Payer: 59

## 2022-12-06 DIAGNOSIS — I081 Rheumatic disorders of both mitral and tricuspid valves: Secondary | ICD-10-CM | POA: Diagnosis not present

## 2022-12-06 DIAGNOSIS — I10 Essential (primary) hypertension: Secondary | ICD-10-CM | POA: Diagnosis not present

## 2022-12-06 DIAGNOSIS — E119 Type 2 diabetes mellitus without complications: Secondary | ICD-10-CM

## 2022-12-06 DIAGNOSIS — I4891 Unspecified atrial fibrillation: Secondary | ICD-10-CM

## 2022-12-06 HISTORY — PX: BUBBLE STUDY: SHX6837

## 2022-12-06 HISTORY — PX: TEE WITHOUT CARDIOVERSION: SHX5443

## 2022-12-06 HISTORY — PX: CARDIOVERSION: SHX1299

## 2022-12-06 LAB — CBC WITH DIFFERENTIAL/PLATELET
Abs Immature Granulocytes: 0.03 10*3/uL (ref 0.00–0.07)
Basophils Absolute: 0.1 10*3/uL (ref 0.0–0.1)
Basophils Relative: 1 %
Eosinophils Absolute: 0.2 10*3/uL (ref 0.0–0.5)
Eosinophils Relative: 2 %
HCT: 42.1 % (ref 39.0–52.0)
Hemoglobin: 12.9 g/dL — ABNORMAL LOW (ref 13.0–17.0)
Immature Granulocytes: 0 %
Lymphocytes Relative: 21 %
Lymphs Abs: 2 10*3/uL (ref 0.7–4.0)
MCH: 21.9 pg — ABNORMAL LOW (ref 26.0–34.0)
MCHC: 30.6 g/dL (ref 30.0–36.0)
MCV: 71.4 fL — ABNORMAL LOW (ref 80.0–100.0)
Monocytes Absolute: 0.9 10*3/uL (ref 0.1–1.0)
Monocytes Relative: 10 %
Neutro Abs: 6.2 10*3/uL (ref 1.7–7.7)
Neutrophils Relative %: 66 %
Platelets: 415 10*3/uL — ABNORMAL HIGH (ref 150–400)
RBC: 5.9 MIL/uL — ABNORMAL HIGH (ref 4.22–5.81)
RDW: 17.8 % — ABNORMAL HIGH (ref 11.5–15.5)
WBC: 9.5 10*3/uL (ref 4.0–10.5)
nRBC: 0 % (ref 0.0–0.2)

## 2022-12-06 LAB — GLUCOSE, CAPILLARY
Glucose-Capillary: 102 mg/dL — ABNORMAL HIGH (ref 70–99)
Glucose-Capillary: 93 mg/dL (ref 70–99)

## 2022-12-06 LAB — BASIC METABOLIC PANEL
Anion gap: 10 (ref 5–15)
BUN: 21 mg/dL (ref 8–23)
CO2: 28 mmol/L (ref 22–32)
Calcium: 8.9 mg/dL (ref 8.9–10.3)
Chloride: 98 mmol/L (ref 98–111)
Creatinine, Ser: 1.34 mg/dL — ABNORMAL HIGH (ref 0.61–1.24)
GFR, Estimated: 60 mL/min — ABNORMAL LOW (ref >60)
Glucose, Bld: 98 mg/dL (ref 70–99)
Potassium: 3.6 mmol/L (ref 3.5–5.1)
Sodium: 136 mmol/L (ref 135–145)

## 2022-12-06 LAB — ECHO TEE

## 2022-12-06 SURGERY — ECHOCARDIOGRAM, TRANSESOPHAGEAL
Anesthesia: Monitor Anesthesia Care

## 2022-12-06 MED ORDER — METOPROLOL SUCCINATE ER 200 MG PO TB24
200.0000 mg | ORAL_TABLET | Freq: Every day | ORAL | 0 refills | Status: DC
  Filled 2022-12-06: qty 30, 30d supply, fill #0

## 2022-12-06 MED ORDER — SACUBITRIL-VALSARTAN 24-26 MG PO TABS
1.0000 | ORAL_TABLET | Freq: Two times a day (BID) | ORAL | 0 refills | Status: DC
  Filled 2022-12-06: qty 60, 30d supply, fill #0

## 2022-12-06 MED ORDER — BUTAMBEN-TETRACAINE-BENZOCAINE 2-2-14 % EX AERO
INHALATION_SPRAY | CUTANEOUS | Status: AC
  Filled 2022-12-06: qty 20

## 2022-12-06 MED ORDER — LACTATED RINGERS IV SOLN: INTRAVENOUS | Status: DC

## 2022-12-06 MED ORDER — EMPAGLIFLOZIN 10 MG PO TABS
10.0000 mg | ORAL_TABLET | Freq: Every day | ORAL | 0 refills | Status: DC
  Filled 2022-12-06: qty 30, 30d supply, fill #0

## 2022-12-06 MED ORDER — PHENYLEPHRINE 80 MCG/ML (10ML) SYRINGE FOR IV PUSH (FOR BLOOD PRESSURE SUPPORT)
PREFILLED_SYRINGE | INTRAVENOUS | Status: DC | PRN
  Administered 2022-12-06 (×3): 160 ug via INTRAVENOUS
  Administered 2022-12-06: 240 ug via INTRAVENOUS

## 2022-12-06 MED ORDER — PROPOFOL 500 MG/50ML IV EMUL
INTRAVENOUS | Status: DC | PRN
  Administered 2022-12-06: 100 ug/kg/min via INTRAVENOUS

## 2022-12-06 MED ORDER — POLY-IRON 150 FORTE 150-25-1 MG-MCG-MG PO CAPS
ORAL_CAPSULE | ORAL | 0 refills | Status: AC
  Filled 2022-12-06: qty 12, 30d supply, fill #0

## 2022-12-06 MED ORDER — EMPAGLIFLOZIN 10 MG PO TABS: 10.0000 mg | ORAL_TABLET | Freq: Every day | ORAL | Status: DC

## 2022-12-06 MED ORDER — BUTAMBEN-TETRACAINE-BENZOCAINE 2-2-14 % EX AERO
INHALATION_SPRAY | CUTANEOUS | Status: DC | PRN
  Administered 2022-12-06: 1 via TOPICAL

## 2022-12-06 MED ORDER — PHENYLEPHRINE HCL-NACL 20-0.9 MG/250ML-% IV SOLN
INTRAVENOUS | Status: DC | PRN
  Administered 2022-12-06: 50 ug/min via INTRAVENOUS

## 2022-12-06 MED ORDER — FUROSEMIDE 40 MG PO TABS
40.0000 mg | ORAL_TABLET | Freq: Every day | ORAL | 0 refills | Status: DC
  Filled 2022-12-06: qty 30, 30d supply, fill #0

## 2022-12-06 MED ORDER — PROPOFOL 10 MG/ML IV BOLUS
INTRAVENOUS | Status: DC | PRN
  Administered 2022-12-06 (×3): 10 mg via INTRAVENOUS

## 2022-12-06 MED ORDER — ATORVASTATIN CALCIUM 10 MG PO TABS
10.0000 mg | ORAL_TABLET | Freq: Every day | ORAL | 0 refills | Status: DC
  Filled 2022-12-06: qty 30, 30d supply, fill #0

## 2022-12-06 MED ORDER — SODIUM CHLORIDE 0.9 % IV SOLN: INTRAVENOUS | Status: DC

## 2022-12-06 NOTE — Plan of Care (Signed)
?  Problem: Education: ?Goal: Knowledge of disease or condition will improve ?Outcome: Adequate for Discharge ?Goal: Understanding of medication regimen will improve ?Outcome: Adequate for Discharge ?Goal: Individualized Educational Video(s) ?Outcome: Adequate for Discharge ?  ?Problem: Activity: ?Goal: Ability to tolerate increased activity will improve ?Outcome: Adequate for Discharge ?  ?Problem: Cardiac: ?Goal: Ability to achieve and maintain adequate cardiopulmonary perfusion will improve ?Outcome: Adequate for Discharge ?  ?Problem: Health Behavior/Discharge Planning: ?Goal: Ability to safely manage health-related needs after discharge will improve ?Outcome: Adequate for Discharge ?  ?Problem: Education: ?Goal: Knowledge of General Education information will improve ?Description: Including pain rating scale, medication(s)/side effects and non-pharmacologic comfort measures ?Outcome: Adequate for Discharge ?  ?Problem: Health Behavior/Discharge Planning: ?Goal: Ability to manage health-related needs will improve ?Outcome: Adequate for Discharge ?  ?Problem: Clinical Measurements: ?Goal: Ability to maintain clinical measurements within normal limits will improve ?Outcome: Adequate for Discharge ?Goal: Will remain free from infection ?Outcome: Adequate for Discharge ?Goal: Diagnostic test results will improve ?Outcome: Adequate for Discharge ?Goal: Respiratory complications will improve ?Outcome: Adequate for Discharge ?Goal: Cardiovascular complication will be avoided ?Outcome: Adequate for Discharge ?  ?Problem: Activity: ?Goal: Risk for activity intolerance will decrease ?Outcome: Adequate for Discharge ?  ?Problem: Nutrition: ?Goal: Adequate nutrition will be maintained ?Outcome: Adequate for Discharge ?  ?Problem: Coping: ?Goal: Level of anxiety will decrease ?Outcome: Adequate for Discharge ?  ?Problem: Elimination: ?Goal: Will not experience complications related to bowel motility ?Outcome: Adequate for  Discharge ?Goal: Will not experience complications related to urinary retention ?Outcome: Adequate for Discharge ?  ?Problem: Pain Managment: ?Goal: General experience of comfort will improve ?Outcome: Adequate for Discharge ?  ?Problem: Safety: ?Goal: Ability to remain free from injury will improve ?Outcome: Adequate for Discharge ?  ?Problem: Skin Integrity: ?Goal: Risk for impaired skin integrity will decrease ?Outcome: Adequate for Discharge ?  ?

## 2022-12-06 NOTE — Anesthesia Procedure Notes (Signed)
Procedure Name: MAC Date/Time: 12/06/2022 9:20 AM  Performed by: Harden Mo, CRNAPre-anesthesia Checklist: Patient identified, Emergency Drugs available, Suction available and Patient being monitored Patient Re-evaluated:Patient Re-evaluated prior to induction Oxygen Delivery Method: Nasal cannula Preoxygenation: Pre-oxygenation with 100% oxygen Induction Type: IV induction Placement Confirmation: positive ETCO2 and breath sounds checked- equal and bilateral Dental Injury: Teeth and Oropharynx as per pre-operative assessment

## 2022-12-06 NOTE — Anesthesia Postprocedure Evaluation (Signed)
Anesthesia Post Note  Patient: Jonathan Gonzalez  Procedure(s) Performed: TRANSESOPHAGEAL ECHOCARDIOGRAM (TEE) CARDIOVERSION BUBBLE STUDY     Patient location during evaluation: Endoscopy Anesthesia Type: MAC Level of consciousness: awake and alert Pain management: pain level controlled Vital Signs Assessment: post-procedure vital signs reviewed and stable Respiratory status: spontaneous breathing, nonlabored ventilation, respiratory function stable and patient connected to nasal cannula oxygen Cardiovascular status: blood pressure returned to baseline and stable Postop Assessment: no apparent nausea or vomiting Anesthetic complications: no  No notable events documented.  Last Vitals:  Vitals:   12/06/22 1006 12/06/22 1008  BP:  106/85  Pulse: 80 80  Resp: (!) 25 20  Temp: 36.7 C   SpO2: 94% 94%    Last Pain:  Vitals:   12/06/22 1006  TempSrc: Oral  PainSc:                  Barnet Glasgow

## 2022-12-06 NOTE — Progress Notes (Signed)
  Echocardiogram Echocardiogram Transesophageal has been performed.  Johny Chess 12/06/2022, 12:01 PM

## 2022-12-06 NOTE — Interval H&P Note (Signed)
History and Physical Interval Note:  12/06/2022 8:58 AM  Jonathan Gonzalez  has presented today for surgery, with the diagnosis of AFIB.  The various methods of treatment have been discussed with the patient and family. After consideration of risks, benefits and other options for treatment, the patient has consented to  Procedure(s): TRANSESOPHAGEAL ECHOCARDIOGRAM (TEE) (N/A) CARDIOVERSION (N/A) as a surgical intervention.  The patient's history has been reviewed, patient examined, no change in status, stable for surgery.  I have reviewed the patient's chart and labs.  Questions were answered to the patient's satisfaction.     Elouise Munroe

## 2022-12-06 NOTE — Progress Notes (Addendum)
CSW received consult patient will need transport assistance when medically ready for dc. CSW will help assist with setting up transportation when patient is medically ready for dc .  Update- CSW provided patients RN with Taxi voucher for patient. Patient signed [employer]. CSW placed [employer] in patients hard chart.

## 2022-12-06 NOTE — Progress Notes (Signed)
Mobility Specialist Progress Note:   12/06/22 1130  Mobility  Activity Ambulated independently in hallway  Level of Assistance Independent  Assistive Device None  Distance Ambulated (ft) 500 ft  Activity Response Tolerated well  Mobility Referral Yes  $Mobility charge 1 Mobility   Pt agreeable to mobility session. No wheezing noted during session. Ambulated on RA, unable to get reliable SpO2 reading. No c/o SOB. Pt back in chair with all needs met.   Nelta Numbers Mobility Specialist Please contact via SecureChat or  Rehab office at [phone]

## 2022-12-06 NOTE — Progress Notes (Signed)
    62 year old male admitted with atrial fibrillation rapid ventricular response and acute systolic heart failure.   Diuresed well.  Entresto started.  Underwent successful TEE guided cardioversion today.  Currently in sinus rhythm. RRR, alert and orient x 3.  Appears comfortable laying in bed.  Breathing is much improved since initial hospital day.   Consider consolidating metoprolol to tartrate 100 mg twice a day to Toprol-XL 200 mg a day given his decreased ejection fraction.  Hopefully case management can help with Jardiance 10 mg, Eliquis 5 mg twice a day, Entresto 24/26.  I think he can leave with Lasix 40 mg daily p.o.  Consider addition of low-dose spironolactone 12.5 mg a day as outpatient.  Blood pressures have been somewhat soft.  Okay for discharge today.  He is eager to see his son.  Candee Furbish, MD

## 2022-12-06 NOTE — Progress Notes (Signed)
   Heart Failure Stewardship Pharmacist Progress Note   PCP: Gildardo Pounds, NP PCP-Cardiologist: Candee Furbish, MD    HPI:  62 yo M with PMH of T2DM, HTN, HLD, obestiy, and afib.  Presented to the ED on 3/18 with shortness of breath, orthopnea, LE edema, and tachycardia. In afib RVR on admission, started on diltiazem gtt. CXR with atelectasis. An ECHO was done 3/18 and showed LVEF reduced to 35-40% (was 45-50% in Dec 2023), global hypokinesis, and RV mild reduced. Diltiazem drip stopped. S/p successful TEE/DCCV on 3/21.  Current HF Medications: Diuretic: furosemide 80 mg IV BID Beta Blocker: metoprolol tartrate 100 mg BID ACE/ARB/ARNI: Entresto 24/26 mg BID SGLT2i: Jardiance 10 mg daily  Prior to admission HF Medications: ACE/ARB/ARNI: lisinopril 20 mg daily SGLT2i: Farxiga 5 mg daily  Pertinent Lab Values: Serum creatinine 1.34, BUN 21, Potassium 3.6, Sodium 136, BNP 223.6, Magnesium 2.0  Vital Signs: Weight: 229 lbs (admission weight: 242 lbs) Blood pressure: 110/80s  Heart rate: 60s  I/O: -1L yesterday; net -6.3L  Medication Assistance / Insurance Benefits Check: Does the patient have prescription insurance?  Yes, high deductible commercial insurance plan  Does the patient qualify for medication assistance through manufacturers or grants?   Yes Eligible grants and/or patient assistance programs: Jardiance Medication assistance applications in progress: Jardiance Medication assistance applications approved: none Approved medication assistance renewals will be completed by: pending  Outpatient Pharmacy:  Prior to admission outpatient pharmacy: [facility] Is the patient willing to use [place] pharmacy at discharge? Yes  Assessment: 1. Acute systolic CHF (LVEF [id number]). NYHA class III symptoms. - Continue furosemide 80 mg IV BID. Strict I/Os and daily weights. Keep K>4 and Mg>2. Recommend KCl 40 mEq x 2 for replacement.  - Continue metoprolol tartrate 100 mg  BID - will need to consolidate to succinate for discharge - Continue Entresto 24/26 mg BID - Consider adding MRA prior to discharge pending BP and creatinine trends - Continue Jardiance 10 mg daily    Plan: 1) Medication changes recommended at this time: - KCl 40 mEq x 2 - Switch to metoprolol succinate for discharge  2) Patient assistance: - Has high deductible commercial plan.  - Farxiga not on formulary - Jardiance copay card only covers up to $175 per month - will need additional assistance - Entresto copay is $590 until deductible met - copay card can be used to lower copay to $10 and will work toward paying deductible - Patient assistance application for Jardiance initiated   3)  Education  - Patient has been educated on current HF medications and potential additions to HF medication regimen - Patient verbalizes understanding that over the next few months, these medication doses may change and more medications may be added to optimize HF regimen - Patient has been educated on basic disease state pathophysiology and goals of therapy  Kerby Nora, PharmD, BCPS Heart Failure Cytogeneticist Phone [phone]

## 2022-12-06 NOTE — Discharge Summary (Signed)
Name: Jonathan Gonzalez MRN: [id number] DOB: 1960-12-23 62 y.o. PCP: Jonathan Pounds, NP  Date of Admission: 12/03/2022  5:34 AM Date of Discharge: 12/05/2021 Attending Physician: Dr. Philipp Ovens  Discharge Diagnosis: Principal Problem:   Atrial fibrillation with RVR ([place]) Active Problems:   DM (diabetes mellitus) ([place])   HTN (hypertension)   HLD (hyperlipidemia)   Heart failure with mildly reduced ejection fraction (HFmrEF) ([place])    Discharge Medications: Allergies as of 12/06/2022   No Known Allergies      Medication List     STOP taking these medications    BC HEADACHE POWDER PO   Farxiga 5 MG Tabs tablet Generic drug: dapagliflozin propanediol   lisinopril 20 MG tablet Commonly known as: ZESTRIL   metoprolol tartrate 100 MG tablet Commonly known as: LOPRESSOR       TAKE these medications    atorvastatin 10 MG tablet Commonly known as: LIPITOR Take 1 tablet (10 mg total) by mouth daily. Start taking on: December 07, 2022 What changed: additional instructions   Eliquis 5 MG Tabs tablet Generic drug: apixaban Take 1 tablet (5 mg total) by mouth 2 (two) times daily.   empagliflozin 10 MG Tabs tablet Commonly known as: JARDIANCE Take 1 tablet (10 mg total) by mouth daily. Start taking on: December 07, 2022   furosemide 40 MG tablet Commonly known as: Lasix Take 1 tablet (40 mg total) by mouth daily.   hydrOXYzine 25 MG capsule Commonly known as: VISTARIL Take 1-2 capsules (25-50 mg total) by mouth at bedtime. For sleep   iron polysaccharides 150 MG capsule Commonly known as: NIFEREX Take 1 capsule (150 mg total) by mouth every Monday, Wednesday, and Friday. Start taking on: December 07, 2022   metFORMIN 500 MG tablet Commonly known as: GLUCOPHAGE Take 1 tablet (500 mg total) by mouth 2 (two) times daily with a meal.   metoprolol 200 MG 24 hr tablet Commonly known as: Toprol XL Take 1 tablet (200 mg total) by mouth daily.   omeprazole 20 MG  capsule Commonly known as: PRILOSEC Take 1 capsule (20 mg total) by mouth daily. FOR ACID REFLUX   sacubitril-valsartan 24-26 MG Commonly known as: ENTRESTO Take 1 tablet by mouth 2 (two) times daily.   True Metrix Blood Glucose Test test strip Generic drug: glucose blood Use as instructed. Check blood glucose level by fingerstick twice per day. NEEDS PASS   True Metrix Meter w/Device Kit Use as instructed. Check blood glucose level by fingerstick twice per day. E11.65        Disposition and follow-up:   JonathanJonathan Gonzalez was discharged from [facility] in Good condition.  At the hospital follow up visit please address:  1.  Follow-up:  a.  Atrial fibrillation: Patient was cardioverted during hospitalization.  Patient discharged on metoprolol succinate and Eliquis. Ensure patient has refills and ensure that patient remains in normal sinus rhythm.  Patient has follow-up with cardiology scheduled for 01/04/2023.    b.  HFrEF: Patient was diagnosed with worsening HFrEF likely secondary to tachycardia during hospitalization.  Patient was diuresed during hospitalization.  Patient discharged on Lasix 40 mg daily, Jardiance, Entresto, and metoprolol succinate.  Ensure patient remains euvolemic and has refills on medication.  Continue to titrate GDMT medication in the outpatient setting.   c.  Iron deficiency anemia: Patient was iron deficient during hospitalization.  Patient has not had colonoscopy in a long time.  Could benefit outpatient colonoscopy.  Patient discharged on iron  supplementation.  Continue to monitor CBC in the outpatient setting.   d.  AKI: Patient AKI likely secondary to cardiorenal as well as initiation of Lasix and Entresto.  Ensure creatinine comes back to baseline in outpatient setting.  2.  Labs / imaging needed at time of follow-up: BMP, CBC  3.  Pending labs/ test needing follow-up: N/A  4.  Medication Changes  Patient discharged on Entresto,  Jardiance, Lasix, and metoprolol succinate.  Patient also discharged on iron supplementation every Monday Wednesday Friday.  Follow-up Appointments:  Follow-up Information     Fenton, Jonathan R, PA .   Specialty: Cardiology Contact information: [place] [address] [place] [address] [place] Follow up in 28 day(s).   Specialty: Cardiology Why: Hospital follow up 01/04/2023 @ 12 noon PLEASE bring a current medication list to appointment FREE  valet parking, Entrance C, off [employer] information: [id number] [place]        Jonathan Pounds, NP. Call in 1 day(s).   Specialty: Nurse Practitioner Why: To make appointment for hospital follow up Contact information: [place] [address] [phone]                 Hospital Course by problem list: Jonathan Gonzalez is a 62 y.o. year old male with PMHx of HTN, HLD, T2DM, and atrial fibrillation on eliquis who presents with subacute progressive SHOB with exertion and palpitations and admitted for Atrial Fibrillation and HFrEF exacerbation.      #Atrial Fibrillation with RVR Patient initially presented to the emergency department with concerns of shortness of breath palpitations.  Patient presented with HR in 140s to ED. EKG at the time consistent with Atrial Fibrillation with RVR.  He states that his wife recently passed away, and he has had lots of stressors including homelessness.  He states he has not been taking his metoprolol.  He did report compliance with Eliquis.  He was restarted on metoprolol during his hospital stay, and is relatively rate controlled.  During hospitalization, cardiology following, recommended TEE with cardioversion.  Patient had this procedure on 12/06/2022 with return to normal sinus rhythm.  Patient to be discharged on Eliquis as well as  metoprolol succinate.  #Acute on Chronic HFrEF Patient has a history of heart failure with mildly reduced ejection fraction.  Given symptoms, new echo obtained, showing reduced ejection fraction of 35 to 40%.  During hospitalization patient was aggressively diuresed and became euvolemic.  Edema and shortness of breath resolved.  GDMT slowly titrated up during hospitalization.  Cardiology followed, who included heart failure team assistance for medication assistance.  Patient started on GDMT with Delene Loll, and continued [place].  Given increased co-pay transition patient from [place] to [place] to be discharged on [place].  Patient also be discharged on Lasix 40 mg daily.  Continue to titrate GDMT medications in outpatient setting.    #AKI AKI likely secondary to Lasix and starting Entresto.  Improved during hospitalization.  Continue titrating GDMT in the outpatient setting. Monitor renal function outpatient setting.    #T2DM He has been consistently euglycemic during his hospital stay. He was treated with SSI at meals.  Metformin held during hospitalization.    #Iron Deficiency Anemia Patient slightly anemic on admission.  Iron studies demonstrated iron deficiency anemia.  Will discharge patient on iron supplementation. Could benefit from outpatient  f/u for a colonoscopy.   #HTN Patient's home dose of metoprolol was restarted during his hospital stay.  Lisinopril was held on admission given hypotension, and patient was subsequently transition to [facility] for GDMT.  Discharged home on metoprolol succinate and Entresto.   #HLD Patient was continued on Lipitor 10 mg daily   Discharge Subjective:  Patient resting in bed comfortably today.  He denies any shortness of breath or chest pain.  He states the procedure went well.  He has no concerns this afternoon.  He states he is ready to go.  Discharge Exam:   BP 106/85   Pulse 80   Temp 98.1 F (36.7 C) (Oral)   Resp 20   Ht  5\' 8"  (1.727 m)   Wt 104 kg   SpO2 94%   BMI 34.85 kg/m  Constitutional: Resting in bed, no acute distress HENT: normocephalic atraumatic Eyes: conjunctiva non-erythematous Cardiovascular: regular rate and rhythm, no m/Gonzalez/g Pulmonary/Chest: normal work of breathing on room air, lungs clear to auscultation bilaterally Extremities: No edema appreciated to bilateral lower extremities  Pertinent Labs, Studies, and Procedures:     Latest Ref Rng & Units 12/06/2022    2:00 AM 12/05/2022    1:59 AM 12/04/2022    2:11 AM  CBC  WBC 4.0 - 10.5 K/uL 9.5  10.1  10.9   Hemoglobin 13.0 - 17.0 g/dL 12.9  12.1  11.9   Hematocrit 39.0 - 52.0 % 42.1  39.5  39.4   Platelets 150 - 400 K/uL 415  375  401        Latest Ref Rng & Units 12/06/2022    2:00 AM 12/05/2022    1:59 AM 12/04/2022    2:11 AM  CMP  Glucose 70 - 99 mg/dL 98  87  118   BUN 8 - 23 mg/dL 21  22  18    Creatinine 0.61 - 1.24 mg/dL 1.34  1.24  1.29   Sodium 135 - 145 mmol/L 136  135  136   Potassium 3.5 - 5.1 mmol/L 3.6  3.2  3.8   Chloride 98 - 111 mmol/L 98  97  103   CO2 22 - 32 mmol/L 28  29  24    Calcium 8.9 - 10.3 mg/dL 8.9  8.9  9.1   Total Protein 6.5 - 8.1 g/dL   6.8   Total Bilirubin 0.3 - 1.2 mg/dL   1.2   Alkaline Phos 38 - 126 U/L   65   AST 15 - 41 U/L   37   ALT 0 - 44 U/L   37     DG Chest 2 View  Result Date: 12/04/2022 CLINICAL DATA:  Dyspnea EXAM: CHEST - 2 VIEW COMPARISON:  Previous studies including the examination of 12/03/2022 FINDINGS: Cardiac size is within normal limits. There is poor inspiration. There are no signs of pulmonary edema. Left hemidiaphragm is elevated. There are linear densities in both lower lung fields. Left lateral CP angle is indistinct. There is no pneumothorax. IMPRESSION: Increased markings in the lower lung fields may suggest subsegmental atelectasis. No significant interval changes are noted in the lung fields. Electronically Signed   By: Elmer Picker M.D.   On: 12/04/2022  13:15   ECHOCARDIOGRAM COMPLETE  Result Date: 12/03/2022    ECHOCARDIOGRAM REPORT   Patient Name:   Jonathan Gonzalez Date of Exam: 12/03/2022 Medical Rec #:  [id number]        Height:       68.0  in Accession #:    [id number]       Weight:       230.0 lb Date of Birth:  [birth year]        BSA:          2.169 m Patient Age:    15 years         BP:           104/79 mmHg Patient Gender: M                HR:           71 bpm. Exam Location:  Inpatient Procedure: 2D Echo, Cardiac Doppler, Color Doppler and Intracardiac            Opacification Agent Indications:    I48.91* Unspeicified atrial fibrillation  History:        Patient has prior history of Echocardiogram examinations, most                 recent 09/15/2022. Arrythmias:Atrial Fibrillation; Risk                 Factors:Hypertension, Dyslipidemia and Diabetes.  Sonographer:    Bernadene Person RDCS Referring Phys: [id number] Velna Ochs  Sonographer Comments: Patient is obese and Technically difficult study due to poor echo windows. Pt sitting up and coughing throughout exam. IMPRESSIONS  1. Left ventricular ejection fraction, by estimation, is 35 to 40%. The left ventricle has moderately decreased function. The left ventricle demonstrates global hypokinesis. Left ventricular diastolic function could not be evaluated.  2. Right ventricular systolic function is mildly reduced. The right ventricular size is normal. There is normal pulmonary artery systolic pressure. The estimated right ventricular systolic pressure is Q000111Q mmHg.  3. Left atrial size was mildly dilated.  4. The mitral valve is abnormal. Moderate mitral valve regurgitation. No evidence of mitral stenosis.  5. Tricuspid valve regurgitation is mild to moderate.  6. The aortic valve is tricuspid. Aortic valve regurgitation is not visualized. No aortic stenosis is present.  7. The inferior vena cava is dilated in size with >50% respiratory variability, suggesting right atrial pressure of 8 mmHg.  Comparison(s): Changes from prior study are noted. The left ventricular function is worsened. FINDINGS  Left Ventricle: Left ventricular ejection fraction, by estimation, is 35 to 40%. The left ventricle has moderately decreased function. The left ventricle demonstrates global hypokinesis. Definity contrast agent was given IV to delineate the left ventricular endocardial borders. The left ventricular internal cavity size was normal in size. There is no left ventricular hypertrophy. Left ventricular diastolic function could not be evaluated due to atrial fibrillation. Left ventricular diastolic function could not be evaluated. Right Ventricle: The right ventricular size is normal. No increase in right ventricular wall thickness. Right ventricular systolic function is mildly reduced. There is normal pulmonary artery systolic pressure. The tricuspid regurgitant velocity is 2.62 m/s, and with an assumed right atrial pressure of 8 mmHg, the estimated right ventricular systolic pressure is Q000111Q mmHg. Left Atrium: Left atrial size was mildly dilated. Right Atrium: Right atrial size was normal in size. Pericardium: There is no evidence of pericardial effusion. Mitral Valve: The mitral valve is abnormal. Moderate mitral valve regurgitation. No evidence of mitral valve stenosis. Tricuspid Valve: The tricuspid valve is grossly normal. Tricuspid valve regurgitation is mild to moderate. No evidence of tricuspid stenosis. Aortic Valve: The aortic valve is tricuspid. Aortic valve regurgitation is not visualized. No aortic stenosis is present. Pulmonic Valve: The pulmonic valve was  grossly normal. Pulmonic valve regurgitation is trivial. No evidence of pulmonic stenosis. Aorta: The aortic root and ascending aorta are structurally normal, with no evidence of dilitation. Venous: The inferior vena cava is dilated in size with greater than 50% respiratory variability, suggesting right atrial pressure of 8 mmHg. IAS/Shunts: The atrial  septum is grossly normal.  LEFT VENTRICLE PLAX 2D LVIDd:         4.70 cm LVIDs:         4.10 cm LV PW:         0.80 cm LV IVS:        1.10 cm LVOT diam:     1.80 cm LV SV:         33 LV SV Index:   15 LVOT Area:     2.54 cm  LV Volumes (MOD) LV vol d, MOD A2C: 122.0 ml LV vol d, MOD A4C: 96.3 ml LV vol s, MOD A2C: 61.5 ml LV vol s, MOD A4C: 60.7 ml LV SV MOD A2C:     60.5 ml LV SV MOD A4C:     96.3 ml LV SV MOD BP:      44.6 ml RIGHT VENTRICLE RV S prime:     7.14 cm/s TAPSE (M-mode): 1.1 cm LEFT ATRIUM             Index        RIGHT ATRIUM           Index LA diam:        4.20 cm 1.94 cm/m   RA Area:     17.70 cm LA Vol (A2C):   66.1 ml 30.48 ml/m  RA Volume:   45.60 ml  21.03 ml/m LA Vol (A4C):   57.7 ml 26.60 ml/m LA Biplane Vol: 63.4 ml 29.23 ml/m  AORTIC VALVE LVOT Vmax:   83.60 cm/s LVOT Vmean:  48.433 cm/s LVOT VTI:    0.130 m  AORTA Ao Root diam: 3.00 cm Ao Asc diam:  3.60 cm MR Peak grad:    70.9 mmHg    TRICUSPID VALVE MR Mean grad:    50.5 mmHg    TR Peak grad:   27.5 mmHg MR Vmax:         421.00 cm/s  TR Vmax:        262.00 cm/s MR Vmean:        337.5 cm/s MR PISA:         1.01 cm     SHUNTS MR PISA Eff ROA: 10 mm       Systemic VTI:  0.13 m MR PISA Radius:  0.40 cm      Systemic Diam: 1.80 cm Eleonore Chiquito MD Electronically signed by Eleonore Chiquito MD Signature Date/Time: 12/03/2022/11:40:11 AM    Final    DG Chest Portable 1 View  Result Date: 12/03/2022 CLINICAL DATA:  Shortness of breath EXAM: PORTABLE CHEST 1 VIEW COMPARISON:  09/14/2022 FINDINGS: Low volume chest. Asymmetric elevation of the left diaphragm. Hazy density at the bases attributed atelectasis. Artifact from EKG leads. Normal cardiothymic silhouette. IMPRESSION: Low volume chest with atelectatic type opacity at the bases. Electronically Signed   By: Jorje Guild M.D.   On: 12/03/2022 06:14     Discharge Instructions: Discharge Instructions     (HEART FAILURE PATIENTS) Call MD:  Anytime you have any of the following  symptoms: 1) 3 pound weight gain in 24 hours or 5 Gonzalez in 1 week 2) shortness of breath, with or without  a dry hacking cough 3) swelling in the hands, feet or stomach 4) if you have to sleep on extra pillows at night in order to breathe.   Complete by: As directed    Call MD for:  difficulty breathing, headache or visual disturbances   Complete by: As directed    Call MD for:  persistant nausea and vomiting   Complete by: As directed    Call MD for:  redness, tenderness, or signs of infection (pain, swelling, redness, odor or green/yellow discharge around incision site)   Complete by: As directed    Call MD for:  temperature >100.4   Complete by: As directed    Diet - low sodium heart healthy   Complete by: As directed    Increase activity slowly   Complete by: As directed       Jonathan Gonzalez,  It was a pleasure taking care of you at [place] were admitted for Atrial Fibrillation and heart failure. You were treated with cardioversion and we helped you take off fluid with lasix. We are discharging you home now that you are doing better. Please follow the following instructions.    1) Regarding your atrial fibrillation, we have cardioverted you in the hospital, and your heart is in regular rhythm now.  We still want to make sure that you have good rate control and we are discharging you on a new metoprolol that you only take 1 time a day.  Please take metoprolol succinate 200 mg once a day.  Please continue taking your Eliquis like you have been. 5 mg twice daily.    2) Regarding your heart failure, we are starting you on some new medications.  The first medication includes Entresto.  Please take this twice daily. The next medicine is metoprolol succinate, please take this 200 mg daily.  The next medicine is Lasix, please take this 40 mg daily.   3) If you develop worsening shortness of breath or feel further palpitations that do not resolve, please return back to the  emergency room   4) Please follow-up with your primary care physician within the next week.  Please make a phone call to schedule follow-up.   5) You have cardiology follow-up on 01/04/2023, please show up to this appointment.  6) Also we have added Iron supplements for you, please take this every Monday, Wednesday, and Friday.    Take care,  Dr. Leigh Aurora, DO     Signed: Leigh Aurora, DO 12/06/2022, 2:22 PM   Pager: [phone]

## 2022-12-06 NOTE — Transfer of Care (Signed)
Immediate Anesthesia Transfer of Care Note  Patient: Jonathan Gonzalez  Procedure(s) Performed: TRANSESOPHAGEAL ECHOCARDIOGRAM (TEE) CARDIOVERSION BUBBLE STUDY  Patient Location: Endoscopy Unit  Anesthesia Type:MAC  Level of Consciousness: awake, alert , and oriented  Airway & Oxygen Therapy: Patient Spontanous Breathing and Patient connected to nasal cannula oxygen  Post-op Assessment: Report given to RN, Post -op Vital signs reviewed and stable, and Patient moving all extremities X 4  Post vital signs: Reviewed and stable  Last Vitals:  Vitals Value Taken Time  BP 109/79   Temp    Pulse 82   Resp 18   SpO2 94     Last Pain:  Vitals:   12/06/22 0834  TempSrc: Temporal  PainSc:       Patients Stated Pain Goal: 0 ([phone])  Complications: No notable events documented.

## 2022-12-06 NOTE — Progress Notes (Signed)
Discharge instructions (including medications) discussed with and copy provided to patient/caregiver 

## 2022-12-06 NOTE — Discharge Instructions (Addendum)
Mr. Jonathan Gonzalez,  It was a pleasure taking care of you at [place] were admitted for Atrial Fibrillation and heart failure. You were treated with cardioversion and we helped you take off fluid with lasix. We are discharging you home now that you are doing better. Please follow the following instructions.   1) Regarding your atrial fibrillation, we have cardioverted you in the hospital, and your heart is in regular rhythm now.  We still want to make sure that you have good rate control and we are discharging you on a new metoprolol that you only take 1 time a day.  Please take metoprolol succinate 200 mg once a day.  Please continue taking your Eliquis like you have been. 5 mg twice daily.   2) Regarding your heart failure, we are starting you on some new medications.  The first medication includes Entresto.  Please take this twice daily. The next medicine is metoprolol succinate, please take this 200 mg daily.  The next medicine is Lasix, please take this 40 mg daily.  3) If you develop worsening shortness of breath or feel further palpitations that do not resolve, please return back to the emergency room  4) Please follow-up with your primary care physician within the next week.  Please make a phone call to schedule follow-up.  5) You have cardiology follow-up on 01/04/2023, please show up to this appointment.  6) Also we have added Iron supplements for you, please take this every Monday, Wednesday, and Friday.   Take care,  Dr. Leigh Aurora, DO

## 2022-12-06 NOTE — CV Procedure (Addendum)
INDICATIONS: Afib rvr  PROCEDURE:   Informed consent was obtained prior to the procedure. The risks, benefits and alternatives for the procedure were discussed and the patient comprehended these risks.  Risks include, but are not limited to, cough, sore throat, vomiting, nausea, somnolence, esophageal and stomach trauma or perforation, bleeding, low blood pressure, aspiration, pneumonia, infection, trauma to the teeth and death.    After a procedural time-out, the oropharynx was anesthetized with 20% benzocaine spray.   During this procedure the patient was administered propofol per anesthesia.  The patient's heart rate, blood pressure, and oxygen saturation were monitored continuously during the procedure. The period of conscious sedation was 30 minutes, of which I was present face-to-face 100% of this time.  The transesophageal probe was inserted in the esophagus and stomach without difficulty and multiple views were obtained.  The patient was kept under observation until the patient left the procedure room.  The patient left the procedure room in stable condition.   Agitated microbubble saline contrast was administered after the procedure with a transthoracic supplemental image.  COMPLICATIONS:    There were no immediate complications.  FINDINGS:   FORMAL ECHOCARDIOGRAM REPORT PENDING No LA appendage thrombus. No LV apical thrombus. 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 Agitated saline exam performed on post procedure transthoracic study, positive for early shunt, likely intracardiac.  Limited echo performed due to patient hypotension. Hiatal hernia present, limited image quality from transgastric.  RECOMMENDATIONS:     Proceed to cardioversion  Procedure: Electrical Cardioversion Indications:  Atrial Fibrillation  Procedure Details:  Consent: Risks of procedure as well as the alternatives and risks of each were explained to the (patient/caregiver).  Consent for procedure  obtained.  Time Out: Verified patient identification, verified procedure, site/side was marked, verified correct patient position, special equipment/implants available, medications/allergies/relevent history reviewed, required imaging and test results available. PERFORMED.  Patient placed on cardiac monitor, pulse oximetry, supplemental oxygen as necessary.  Sedation given:  propofol per anesthesia Pacer pads placed anterior and posterior chest.  Cardioverted 1 time(s).  Cardioversion with synchronized biphasic 120J shock.  Evaluation: Findings: Post procedure EKG shows: NSR Complications: None Patient did tolerate procedure well.  Time Spent Directly with the Patient:  30 minutes   Jonathan Gonzalez 12/06/2022, 9:52 AM

## 2022-12-07 ENCOUNTER — Encounter (HOSPITAL_COMMUNITY): Payer: Self-pay | Admitting: Internal Medicine

## 2022-12-10 ENCOUNTER — Telehealth: Payer: Self-pay

## 2022-12-10 NOTE — Transitions of Care (Post Inpatient/ED Visit) (Signed)
   12/10/2022  Name: ROWLEY GROENEWEG MRN: [id number] DOB: Aug 11, 1961  Today's TOC FU Call Status: Today's TOC FU Call Status:: Unsuccessul Call (1st Attempt) Unsuccessful Call (1st Attempt) Date: 12/10/22  Attempted to reach the patient regarding the most recent Inpatient/ED visit.  Follow Up Plan: Additional outreach attempts will be made to reach the patient to complete the Transitions of Care (Post Inpatient/ED visit) call.   Signature  Eden Lathe, RN

## 2022-12-11 ENCOUNTER — Telehealth: Payer: Self-pay

## 2022-12-11 NOTE — Transitions of Care (Post Inpatient/ED Visit) (Signed)
   12/11/2022  Name: Jonathan Gonzalez MRN: [id number] DOB: Dec 06, 1960  Today's TOC FU Call Status: Today's TOC FU Call Status:: Unsuccessful Call (2nd Attempt) Unsuccessful Call (1st Attempt) Date: 12/10/22 Unsuccessful Call (2nd Attempt) Date: 12/11/22  Attempted to reach the patient regarding the most recent Inpatient/ED visit.  Follow Up Plan: Additional outreach attempts will be made to reach the patient to complete the Transitions of Care (Post Inpatient/ED visit) call.   Signature Eden Lathe, RN

## 2022-12-12 ENCOUNTER — Telehealth: Payer: Self-pay

## 2022-12-12 NOTE — Transitions of Care (Post Inpatient/ED Visit) (Signed)
   12/12/2022  Name: Jonathan Gonzalez MRN: [id number] DOB: 04-05-1961  Today's TOC FU Call Status: Today's TOC FU Call Status:: Unsuccessful Call (3rd Attempt) Unsuccessful Call (1st Attempt) Date: 12/10/22 Unsuccessful Call (2nd Attempt) Date: 12/11/22 Unsuccessful Call (3rd Attempt) Date: 12/12/22  Attempted to reach the patient regarding the most recent Inpatient/ED visit.  Follow Up Plan: No further outreach attempts will be made at this time. We have been unable to contact the patient.  He has an appointment at [facility] with Geryl Rankins, NP 12/28/2022.   Signature Eden Lathe, RN

## 2022-12-14 ENCOUNTER — Ambulatory Visit: Payer: Self-pay | Admitting: Nurse Practitioner

## 2022-12-25 ENCOUNTER — Telehealth (HOSPITAL_COMMUNITY): Payer: Self-pay

## 2022-12-25 NOTE — Telephone Encounter (Signed)
Heart Failure Patient Advocate Encounter  Medication assistance forms for Jonathan Gonzalez have been started. Patient has signed forms.  Provider information will need to be completed before submitting.  Forms have been attached to patient chart under 'Media' tab. Routing to appropriate office.  Burnell Blanks, CPhT Rx Patient Advocate Phone: [phone]

## 2022-12-26 NOTE — Telephone Encounter (Signed)
Paperwork printed, signed and faxed ti company for assistance.

## 2022-12-28 ENCOUNTER — Other Ambulatory Visit: Payer: Self-pay

## 2022-12-28 ENCOUNTER — Ambulatory Visit (HOSPITAL_BASED_OUTPATIENT_CLINIC_OR_DEPARTMENT_OTHER): Payer: Self-pay | Admitting: Nurse Practitioner

## 2022-12-28 ENCOUNTER — Encounter: Payer: Self-pay | Admitting: Nurse Practitioner

## 2022-12-28 VITALS — BP 108/77 | HR 89 | Ht 68.0 in | Wt 237.2 lb

## 2022-12-28 DIAGNOSIS — E7800 Pure hypercholesterolemia, unspecified: Secondary | ICD-10-CM

## 2022-12-28 DIAGNOSIS — F5101 Primary insomnia: Secondary | ICD-10-CM

## 2022-12-28 DIAGNOSIS — Z1211 Encounter for screening for malignant neoplasm of colon: Secondary | ICD-10-CM

## 2022-12-28 DIAGNOSIS — K219 Gastro-esophageal reflux disease without esophagitis: Secondary | ICD-10-CM

## 2022-12-28 DIAGNOSIS — I5022 Chronic systolic (congestive) heart failure: Secondary | ICD-10-CM

## 2022-12-28 DIAGNOSIS — I10 Essential (primary) hypertension: Secondary | ICD-10-CM

## 2022-12-28 DIAGNOSIS — E1165 Type 2 diabetes mellitus with hyperglycemia: Secondary | ICD-10-CM

## 2022-12-28 DIAGNOSIS — I4891 Unspecified atrial fibrillation: Secondary | ICD-10-CM

## 2022-12-28 MED ORDER — EMPAGLIFLOZIN 10 MG PO TABS
10.0000 mg | ORAL_TABLET | Freq: Every day | ORAL | 1 refills | Status: DC
  Filled 2022-12-28: qty 90, 90d supply, fill #0

## 2022-12-28 MED ORDER — SACUBITRIL-VALSARTAN 24-26 MG PO TABS
1.0000 | ORAL_TABLET | Freq: Two times a day (BID) | ORAL | 1 refills | Status: DC
  Filled 2022-12-28: qty 180, 90d supply, fill #0

## 2022-12-28 MED ORDER — METFORMIN HCL 500 MG PO TABS
500.0000 mg | ORAL_TABLET | Freq: Two times a day (BID) | ORAL | 1 refills | Status: DC
  Filled 2022-12-28: qty 60, 30d supply, fill #0

## 2022-12-28 MED ORDER — FUROSEMIDE 40 MG PO TABS
40.0000 mg | ORAL_TABLET | Freq: Every day | ORAL | 1 refills | Status: DC
  Filled 2022-12-28: qty 90, 90d supply, fill #0

## 2022-12-28 MED ORDER — OMEPRAZOLE 20 MG PO CPDR
20.0000 mg | DELAYED_RELEASE_CAPSULE | Freq: Every day | ORAL | 1 refills | Status: DC
  Filled 2022-12-28: qty 30, 30d supply, fill #0

## 2022-12-28 MED ORDER — METOPROLOL SUCCINATE ER 200 MG PO TB24
200.0000 mg | ORAL_TABLET | Freq: Every day | ORAL | 1 refills | Status: DC
  Filled 2022-12-28: qty 90, 90d supply, fill #0

## 2022-12-28 MED ORDER — HYDROXYZINE PAMOATE 25 MG PO CAPS
25.0000 mg | ORAL_CAPSULE | Freq: Every evening | ORAL | 3 refills | Status: DC
  Filled 2022-12-28: qty 60, 30d supply, fill #0

## 2022-12-28 MED ORDER — ATORVASTATIN CALCIUM 10 MG PO TABS
10.0000 mg | ORAL_TABLET | Freq: Every day | ORAL | 1 refills | Status: DC
  Filled 2022-12-28: qty 90, 90d supply, fill #0

## 2022-12-28 NOTE — Progress Notes (Signed)
Assessment & Plan:  Jonathan Gonzalez was seen today for hypertension.  Diagnoses and all orders for this visit:  Primary hypertension Continue all antihypertensives as prescribed.  Reminded to bring in blood pressure log for follow  up appointment.  RECOMMENDATIONS: DASH/Mediterranean Diets are healthier choices for HTN.   -     Basic metabolic panel  Type 2 diabetes mellitus with hyperglycemia, without long-term current use of insulin Continue blood sugar control as discussed in office today, low carbohydrate diet, and regular physical exercise as tolerated, 150 minutes per week (30 min each day, 5 days per week, or 50 min 3 days per week). Keep blood sugar logs with fasting goal of 90-130 mg/dl, post prandial (after you eat) less than 180.  For Hypoglycemia: BS <60 and Hyperglycemia BS >400; contact the clinic ASAP. Annual eye exams and foot exams are recommended.  -     empagliflozin (JARDIANCE) 10 MG TABS tablet; Take 1 tablet (10 mg total) by mouth daily. -     metFORMIN (GLUCOPHAGE) 500 MG tablet; Take 1 tablet (500 mg total) by mouth 2 (two) times daily with a meal.  Atrial fibrillation with RVR -     metoprolol (TOPROL XL) 200 MG 24 hr tablet; Take 1 tablet (200 mg total) by mouth daily.  Heart failure with mildly reduced ejection fraction (HFmrEF) DASH DIET -     furosemide (LASIX) 40 MG tablet; Take 1 tablet (40 mg total) by mouth daily. -     sacubitril-valsartan (ENTRESTO) 24-26 MG; Take 1 tablet by mouth 2 (two) times daily.  Colon cancer screening -     Fecal occult blood, imunochemical -     Ambulatory referral to Gastroenterology  Primary insomnia -     hydrOXYzine (VISTARIL) 25 MG capsule; Take 1-2 capsules (25-50 mg total) by mouth at bedtime. For sleep  GERD without esophagitis INSTRUCTIONS: Avoid GERD Triggers: acidic, spicy or fried foods, caffeine, coffee, sodas,  alcohol and chocolate.   -     omeprazole (PRILOSEC) 20 MG capsule; Take 1 capsule (20 mg total) by  mouth daily. FOR ACID REFLUX  Pure hypercholesterolemia INSTRUCTIONS: Work on a low fat, heart healthy diet and participate in regular aerobic exercise program by working out at least 150 minutes per week; 5 days a week-30 minutes per day. Avoid red meat/beef/steak,  fried foods. junk foods, sodas, sugary drinks, unhealthy snacking, alcohol and smoking.  Drink at least 80 oz of water per day and monitor your carbohydrate intake daily.   -     atorvastatin (LIPITOR) 10 MG tablet; Take 1 tablet (10 mg total) by mouth daily.    Patient has been counseled on age-appropriate routine health concerns for screening and prevention. These are reviewed and up-to-date. Referrals have been placed accordingly. Immunizations are up-to-date or declined.    Subjective:   Chief Complaint  Patient presents with   Hypertension   Jonathan Gonzalez 62 y.o. male presents to office today for follow up to HTN  He has a past medical history of DM2, Hyperlipidemia, AFib with RVR, CHF, and Hypertension.  Currently being followed by Cardiology   HTN Blood pressure is well controlled today. Taking Toprol XL 200 mg daily and Entresto 24-26 mg BID.  BP Readings from Last 3 Encounters:  12/28/22 108/77  12/06/22 106/85  10/08/22 136/80    DM 2 Well controlled with Jardiance 10 mg daily and Metformin  BID.  Lab Results  Component Value Date   HGBA1C 6.6 (H) 09/15/2022  Lipids well controlled with atorvastatin 10 mg daily. Lab Results  Component Value Date   LDLCALC 73 09/15/2022      Review of Systems  Constitutional:  Negative for fever, malaise/fatigue and weight loss.  HENT: Negative.  Negative for nosebleeds.   Eyes: Negative.  Negative for blurred vision, double vision and photophobia.  Respiratory: Negative.  Negative for cough and shortness of breath.   Cardiovascular: Negative.  Negative for chest pain, palpitations and leg swelling.  Gastrointestinal: Negative.  Negative for heartburn,  nausea and vomiting.  Musculoskeletal: Negative.  Negative for myalgias.  Neurological: Negative.  Negative for dizziness, focal weakness, seizures and headaches.  Psychiatric/Behavioral:  Positive for depression. Negative for suicidal ideas.     Past Medical History:  Diagnosis Date   Diabetes mellitus without complication    Hyperlipidemia    Hypertension     Past Surgical History:  Procedure Laterality Date   BUBBLE STUDY  12/06/2022   Procedure: BUBBLE STUDY;  Surgeon: Parke Poisson, MD;  Location: [facility] ENDOSCOPY;  Service: Cardiovascular;;   CARDIOVERSION N/A 12/06/2022   Procedure: CARDIOVERSION;  Surgeon: Parke Poisson, MD;  Location: [facility] ENDOSCOPY;  Service: Cardiovascular;  Laterality: N/A;   HERNIA REPAIR     TEE WITHOUT CARDIOVERSION N/A 12/06/2022   Procedure: TRANSESOPHAGEAL ECHOCARDIOGRAM (TEE);  Surgeon: Parke Poisson, MD;  Location: [facility] ENDOSCOPY;  Service: Cardiovascular;  Laterality: N/A;    History reviewed. No pertinent family history.  Social History Reviewed with no changes to be made today.   Outpatient Medications Prior to Visit  Medication Sig Dispense Refill   apixaban (ELIQUIS) 5 MG TABS tablet Take 1 tablet (5 mg total) by mouth 2 (two) times daily. 180 tablet 1   Blood Glucose Monitoring Suppl (TRUE METRIX METER) w/Device KIT Use as instructed. Check blood glucose level by fingerstick twice per day. E11.65 1 kit 0   glucose blood (TRUE METRIX BLOOD GLUCOSE TEST) test strip Use as instructed. Check blood glucose level by fingerstick twice per day. NEEDS PASS 100 each 12   Iron Polysacch Cmplx-B12-FA (POLY-IRON 150 FORTE) 150-0.025-1 MG CAPS Take by mouth every Monday, Wednesday, and Friday. 12 capsule 0   atorvastatin (LIPITOR) 10 MG tablet Take 1 tablet (10 mg total) by mouth daily. 30 tablet 0   empagliflozin (JARDIANCE) 10 MG TABS tablet Take 1 tablet (10 mg total) by mouth daily. 30 tablet 0   furosemide (LASIX) 40 MG tablet Take 1 tablet  (40 mg total) by mouth daily. 30 tablet 0   hydrOXYzine (VISTARIL) 25 MG capsule Take 1-2 capsules (25-50 mg total) by mouth at bedtime. For sleep 60 capsule 1   metFORMIN (GLUCOPHAGE) 500 MG tablet Take 1 tablet (500 mg total) by mouth 2 (two) times daily with a meal. 60 tablet 2   metoprolol (TOPROL XL) 200 MG 24 hr tablet Take 1 tablet (200 mg total) by mouth daily. 30 tablet 0   omeprazole (PRILOSEC) 20 MG capsule Take 1 capsule (20 mg total) by mouth daily. FOR ACID REFLUX 30 capsule 3   sacubitril-valsartan (ENTRESTO) 24-26 MG Take 1 tablet by mouth 2 (two) times daily. 60 tablet 0   No facility-administered medications prior to visit.    No Known Allergies     Objective:    BP 108/77   Pulse 89   Ht 5\' 8"  (1.727 m)   Wt 237 lb 3.2 oz (107.6 kg)   SpO2 98%   BMI 36.07 kg/m  Wt Readings from Last 3 Encounters:  12/28/22 237 lb 3.2 oz (107.6 kg)  12/06/22 229 lb 3.2 oz (104 kg)  10/08/22 237 lb 3.2 oz (107.6 kg)    Physical Exam Vitals and nursing note reviewed.  Constitutional:      Appearance: He is well-developed.  HENT:     Head: Normocephalic and atraumatic.  Cardiovascular:     Rate and Rhythm: Normal rate. Rhythm irregular.     Heart sounds: Normal heart sounds. No murmur heard.    No friction rub. No gallop.  Pulmonary:     Effort: Pulmonary effort is normal. No tachypnea or respiratory distress.     Breath sounds: Normal breath sounds. No decreased breath sounds, wheezing, rhonchi or rales.  Chest:     Chest wall: No tenderness.  Abdominal:     General: Bowel sounds are normal.     Palpations: Abdomen is soft.  Musculoskeletal:        General: Normal range of motion.     Cervical back: Normal range of motion.  Skin:    General: Skin is warm and dry.  Neurological:     Mental Status: He is alert and oriented to person, place, and time.     Coordination: Coordination normal.  Psychiatric:        Behavior: Behavior normal. Behavior is cooperative.         Thought Content: Thought content normal.        Judgment: Judgment normal.          Patient has been counseled extensively about nutrition and exercise as well as the importance of adherence with medications and regular follow-up. The patient was given clear instructions to go to ER or return to medical center if symptoms don't improve, worsen or new problems develop. The patient verbalized understanding.   Follow-up: Return in about 3 months (around 03/29/2023) for physical.   Claiborne Rigg, FNP-BC [facility] [place], [place] [phone]   12/28/2022, 2:33 PM

## 2022-12-29 LAB — BASIC METABOLIC PANEL
BUN/Creatinine Ratio: 15 (ref 10–24)
BUN: 17 mg/dL (ref 8–27)
CO2: 23 mmol/L (ref 20–29)
Calcium: 9.5 mg/dL (ref 8.6–10.2)
Chloride: 107 mmol/L — ABNORMAL HIGH (ref 96–106)
Creatinine, Ser: 1.14 mg/dL (ref 0.76–1.27)
Glucose: 102 mg/dL — ABNORMAL HIGH (ref 70–99)
Potassium: 4.6 mmol/L (ref 3.5–5.2)
Sodium: 145 mmol/L — ABNORMAL HIGH (ref 134–144)
eGFR: 72 mL/min/{1.73_m2} (ref >59)

## 2023-01-03 ENCOUNTER — Other Ambulatory Visit: Payer: Self-pay

## 2023-01-03 ENCOUNTER — Other Ambulatory Visit (HOSPITAL_COMMUNITY): Payer: Self-pay

## 2023-01-04 ENCOUNTER — Other Ambulatory Visit (HOSPITAL_COMMUNITY): Payer: Self-pay

## 2023-01-04 ENCOUNTER — Telehealth (HOSPITAL_COMMUNITY): Payer: Self-pay | Admitting: Licensed Clinical Social Worker

## 2023-01-04 ENCOUNTER — Ambulatory Visit (HOSPITAL_COMMUNITY)
Admit: 2023-01-04 | Discharge: 2023-01-04 | Disposition: A | Discharge status: Home / Self Care | Payer: 59 | Attending: Physician Assistant | Admitting: Physician Assistant

## 2023-01-04 ENCOUNTER — Other Ambulatory Visit (HOSPITAL_COMMUNITY): Payer: Self-pay | Admitting: *Deleted

## 2023-01-04 ENCOUNTER — Encounter (HOSPITAL_COMMUNITY): Payer: Self-pay

## 2023-01-04 ENCOUNTER — Other Ambulatory Visit: Payer: Self-pay

## 2023-01-04 VITALS — BP 100/62 | HR 87 | Wt 234.0 lb

## 2023-01-04 DIAGNOSIS — Z7984 Long term (current) use of oral hypoglycemic drugs: Secondary | ICD-10-CM | POA: Diagnosis not present

## 2023-01-04 DIAGNOSIS — E119 Type 2 diabetes mellitus without complications: Secondary | ICD-10-CM | POA: Diagnosis not present

## 2023-01-04 DIAGNOSIS — I5022 Chronic systolic (congestive) heart failure: Secondary | ICD-10-CM | POA: Insufficient documentation

## 2023-01-04 DIAGNOSIS — I480 Paroxysmal atrial fibrillation: Secondary | ICD-10-CM | POA: Diagnosis not present

## 2023-01-04 DIAGNOSIS — Z79899 Other long term (current) drug therapy: Secondary | ICD-10-CM | POA: Insufficient documentation

## 2023-01-04 DIAGNOSIS — I429 Cardiomyopathy, unspecified: Secondary | ICD-10-CM | POA: Insufficient documentation

## 2023-01-04 DIAGNOSIS — Z560 Unemployment, unspecified: Secondary | ICD-10-CM | POA: Diagnosis not present

## 2023-01-04 DIAGNOSIS — E785 Hyperlipidemia, unspecified: Secondary | ICD-10-CM | POA: Insufficient documentation

## 2023-01-04 DIAGNOSIS — Z7901 Long term (current) use of anticoagulants: Secondary | ICD-10-CM | POA: Insufficient documentation

## 2023-01-04 DIAGNOSIS — E782 Mixed hyperlipidemia: Secondary | ICD-10-CM

## 2023-01-04 DIAGNOSIS — Z5986 Financial insecurity: Secondary | ICD-10-CM | POA: Insufficient documentation

## 2023-01-04 DIAGNOSIS — I10 Essential (primary) hypertension: Secondary | ICD-10-CM

## 2023-01-04 DIAGNOSIS — I110 Hypertensive heart disease with heart failure: Secondary | ICD-10-CM | POA: Insufficient documentation

## 2023-01-04 DIAGNOSIS — I5020 Unspecified systolic (congestive) heart failure: Secondary | ICD-10-CM

## 2023-01-04 DIAGNOSIS — I4819 Other persistent atrial fibrillation: Secondary | ICD-10-CM

## 2023-01-04 LAB — COMPREHENSIVE METABOLIC PANEL
ALT: 26 U/L (ref 0–44)
AST: 29 U/L (ref 15–41)
Albumin: 3.8 g/dL (ref 3.5–5.0)
Alkaline Phosphatase: 63 U/L (ref 38–126)
Anion gap: 8 (ref 5–15)
BUN: 19 mg/dL (ref 8–23)
CO2: 25 mmol/L (ref 22–32)
Calcium: 9.6 mg/dL (ref 8.9–10.3)
Chloride: 105 mmol/L (ref 98–111)
Creatinine, Ser: 1.35 mg/dL — ABNORMAL HIGH (ref 0.61–1.24)
GFR, Estimated: 59 mL/min — ABNORMAL LOW (ref >60)
Glucose, Bld: 115 mg/dL — ABNORMAL HIGH (ref 70–99)
Potassium: 4.4 mmol/L (ref 3.5–5.1)
Sodium: 138 mmol/L (ref 135–145)
Total Bilirubin: 1.1 mg/dL (ref 0.3–1.2)
Total Protein: 7.4 g/dL (ref 6.5–8.1)

## 2023-01-04 LAB — BRAIN NATRIURETIC PEPTIDE: B Natriuretic Peptide: 445.7 pg/mL — ABNORMAL HIGH (ref 0.0–100.0)

## 2023-01-04 MED ORDER — AMIODARONE HCL 200 MG PO TABS
ORAL_TABLET | ORAL | 0 refills | Status: DC
  Filled 2023-01-04: qty 42, 30d supply, fill #0

## 2023-01-04 MED ORDER — SPIRONOLACTONE 25 MG PO TABS
12.5000 mg | ORAL_TABLET | Freq: Every day | ORAL | 1 refills | Status: DC
  Filled 2023-01-04: qty 15, 30d supply, fill #0

## 2023-01-04 MED ORDER — APIXABAN 5 MG PO TABS
5.0000 mg | ORAL_TABLET | Freq: Two times a day (BID) | ORAL | 1 refills | Status: DC
  Filled 2023-01-21: qty 60, 30d supply, fill #0

## 2023-01-04 NOTE — Progress Notes (Signed)
Heart and Vascular Care Navigation  01/04/2023  Jonathan Gonzalez [birth year] [id number]  Reason for Referral: Patient seen in [facility] Patient is participating in a Managed Medicaid Plan:   Pending  Engaged with patient face to face for follow up visit for Heart and Vascular Care Coordination.                                                                                                   Assessment:   Patient is a 62 yo male who was recently widowed and has a 42 yo autistic son. He states that his wife home schooled their son and he has never had any formal education and never formally diagnosed with autism. His son has no income and no insurance and does not work. He reports they have been living in a hotel for quite some time now. 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 He appears to have a pending medicaid and his food stamps were discontinued in December, 2023. He states that he has a Programme researcher, broadcasting/film/video" and denies any concerns with minutes. His sister Jonathan Gonzalez who lives in [place] told him that she plans to come up and assist him with getting his son to Social security to apply for benefits.  She also has asked patient and son to come back to [place] to reside with her. Patient states he is hesitant to move to [facility] as he like sit here but will consider the option.                               HRT/VAS Care Coordination     Patients Home Cardiology Office Heart Failure Clinic  HF [facility]   Outpatient Care Team Social Worker   Social Worker Name: Lasandra Beech, [place] [phone]   Living arrangements for the past 2 months Hotel/Motel   Lives with: Adult Children  87 yo autistic son   Patient Current Insurance Coverage Medicaid Pending   Patient Has Concern With Paying Medical Bills Yes   Does Patient Have Prescription Coverage? No   Home Assistive Devices/Equipment None       Social History:                                                                              SDOH Screenings   Food Insecurity: No Food Insecurity (12/05/2022)  Housing: Medium Risk (01/04/2023)  Transportation Needs: Unmet Transportation Needs (01/04/2023)  Utilities: Not At Risk (12/04/2022)  Alcohol Screen: Low Risk  (12/05/2022)  Depression (PHQ2-9): Medium Risk (12/28/2022)  Financial Resource Strain: Medium Risk (12/05/2022)  Tobacco Use: Low Risk  (12/28/2022)    SDOH Interventions: Financial Resources:  [employer] Insecurity:   Statistician and provided [employer] Insecurity:  Housing Interventions: Other (Comment) (Patient staying at the [employer])  Transportation:   Transportation Interventions: [employer] Given    Follow-up plan:  CSW provided patient with the contact information for Social Security for his sister to assist his son. CSW discussed need to reapply for food stamps and provided [employer] Food Bag for immediate need. Patient identified transportation as a barrier at times and he often uses the bus line. CSW provided bus passes for upcoming appointment and will make taxi transport arrangement for upcoming procedure on 12-19-22 as he will need a responsible person and the bus will not be an acceptable mode of transport home after the procedure. CSW will continue to follow for [facility] assistance and support. Lasandra Beech, LCSW, CCSW-MCS [phone]

## 2023-01-04 NOTE — H&P (View-Only) (Signed)
  HEART & VASCULAR TRANSITION OF CARE CONSULT NOTE     Referring Physician: Dr. Guilloud Primary Care: Zelda Fleming, NP Primary Cardiologist: Dr. Skains  HPI: Referred to clinic by Dr. Guilloud with Internal Medicine for heart failure consultation. 62 y.o. male with history of HFrEF, atrial fibrillation (diagnosed 12/23), HTN, HLD, DM.   Patient admitted 09/14/22 with new afib with RVR. Treated with rate control. 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 Echo demonstrated EF 45-50%, RV okay, severe LAE. Plan was for cardioversion at a later date but cancelled the procedure d/t passing of his wife. He later missed doses of eliquis and ran out of metoprolol and farxiga. He was admitted with Afib with RVR and a/c CHF in March 2024. EF down slightly further to 35-40%. He was diuresed and GDMT titrated. He underwent successful DCCV during the admission.  He is here today for hospital follow-up. Denies dyspnea, orthopnea, PND or lower extremity edema. He does not have a scale to weigh. Has been taking all medications exception for eliquis which he ran out of a few days ago.  His wife passed away recently. Currently lives at a motel with his autistic son. Lost his job at [employer] this past week.   Medicaid is pending. Has some trouble paying for medications. Takes the bus to appointments.    Review of Systems: [y] = yes, [ ] = no   General: Weight gain [ ]; Weight loss [ ]; Anorexia [ ]; Fatigue [ ]; Fever [ ]; Chills [ ]; Weakness [ ]  Cardiac: Chest pain/pressure [ ]; Resting SOB [ ]; Exertional SOB [ ]; Orthopnea [ ]; Pedal Edema [ ]; Palpitations [ ]; Syncope [ ]; Presyncope [ ]; Paroxysmal nocturnal dyspnea[ ]  Pulmonary: Cough [ ]; Wheezing[ ]; Hemoptysis[ ]; Sputum [ ]; Snoring [ ]  GI: Vomiting[ ]; Dysphagia[ ]; Melena[ ]; Hematochezia [ ]; Heartburn[ ]; Abdominal pain [ ]; Constipation [ ]; Diarrhea [ ]; BRBPR [ ]  GU: Hematuria[ ]; Dysuria [ ];  Nocturia[ ]  Vascular: Pain in legs with walking [ ]; Pain in feet with lying flat [ ]; Non-healing sores [ ]; Stroke [ ]; TIA [ ]; Slurred speech [ ];  Neuro: Headaches[ ]; Vertigo[ ]; Seizures[ ]; Paresthesias[ ];Blurred vision [ ]; Diplopia [ ]; Vision changes [ ]  Ortho/Skin: Arthritis [ ]; Joint pain [ ]; Muscle pain [ ]; Joint swelling [ ]; Back Pain [ ]; Rash [ ]  Psych: Depression[ ]; Anxiety[ ]  Heme: Bleeding problems [ ]; Clotting disorders [ ]; Anemia [ ]  Endocrine: Diabetes [Y ]; Thyroid dysfunction[ ]   Past Medical History:  Diagnosis Date   Diabetes mellitus without complication    Hyperlipidemia    Hypertension     Current Outpatient Medications  Medication Sig Dispense Refill   amiodarone (PACERONE) 200 MG tablet Take 1 tablet (200 mg total) by mouth 2 (two) times daily for 12 days, THEN 1 tablet (200 mg total) daily for 18 days. 42 tablet 0   atorvastatin (LIPITOR) 10 MG tablet Take 1 tablet (10 mg total) by mouth daily. 90 tablet 1   Blood Glucose Monitoring Suppl (TRUE METRIX METER) w/Device KIT Use as instructed. Check blood glucose level by fingerstick twice per day. E11.65 1 kit 0   empagliflozin (JARDIANCE) 10 MG TABS tablet Take 1 tablet (10 mg total) by mouth daily. 90 tablet 1   furosemide (LASIX) 40 MG tablet Take   1 tablet (40 mg total) by mouth daily. 90 tablet 1   glucose blood (TRUE METRIX BLOOD GLUCOSE TEST) test strip Use as instructed. Check blood glucose level by fingerstick twice per day. NEEDS PASS 100 each 12   hydrOXYzine (VISTARIL) 25 MG capsule Take 1-2 capsules (25-50 mg total) by mouth at bedtime. For sleep 60 capsule 3   Iron Polysacch Cmplx-B12-FA (POLY-IRON 150 FORTE) 150-0.025-1 MG CAPS Take by mouth every Monday, Wednesday, and Friday. 12 capsule 0   metFORMIN (GLUCOPHAGE) 500 MG tablet Take 1 tablet (500 mg total) by mouth 2 (two) times daily with a meal. 180 tablet 1   metoprolol (TOPROL XL) 200 MG 24 hr tablet Take 1 tablet (200 mg  total) by mouth daily. 90 tablet 1   omeprazole (PRILOSEC) 20 MG capsule Take 1 capsule (20 mg total) by mouth daily. FOR ACID REFLUX 90 capsule 1   sacubitril-valsartan (ENTRESTO) 24-26 MG Take 1 tablet by mouth 2 (two) times daily. 180 tablet 1   spironolactone (ALDACTONE) 25 MG tablet Take 0.5 tablets (12.5 mg total) by mouth daily. 15 tablet 1   apixaban (ELIQUIS) 5 MG TABS tablet Take 1 tablet (5 mg total) by mouth 2 (two) times daily. 180 tablet 1   No current facility-administered medications for this encounter.    No Known Allergies    Social History   Socioeconomic History   Marital status: Widowed    Spouse name: Not on file   Number of children: 1   Years of education: Not on file   Highest education level: Not on file  Occupational History   Occupation: [employer]  Tobacco Use   Smoking status: Never   Smokeless tobacco: Never   Tobacco comments:    Never smoke 10/08/22  Vaping Use   Vaping Use: Never used  Substance and Sexual Activity   Alcohol use: Never   Drug use: Never   Sexual activity: Not Currently  Other Topics Concern   Not on file  Social History Narrative   Not on file   Social Determinants of Health   Financial Resource Strain: Medium Risk (12/05/2022)   Overall Financial Resource Strain (CARDIA)    Difficulty of Paying Living Expenses: Somewhat hard  Food Insecurity: No Food Insecurity (12/05/2022)   Hunger Vital Sign    Worried About Running Out of Food in the Last Year: Never true    Ran Out of Food in the Last Year: Never true  Transportation Needs: Unmet Transportation Needs (01/04/2023)   PRAPARE - Transportation    Lack of Transportation (Medical): Yes    Lack of Transportation (Non-Medical): Yes  Physical Activity: Not on file  Stress: Not on file  Social Connections: Not on file  Intimate Partner Violence: Not At Risk (12/04/2022)   Humiliation, Afraid, Rape, and Kick questionnaire    Fear of Current or Ex-Partner: No     Emotionally Abused: No    Physically Abused: No    Sexually Abused: No     No family history on file.  Vitals:   01/04/23 1126  BP: 100/62  Pulse: 87  SpO2: 96%  Weight: 106.1 kg (234 lb)    PHYSICAL EXAM: General:  Well appearing. Ambulated into clinic. HEENT: normal Neck: No JVD. Carotids 2+ bilat; no bruits.  Cor:  Irregularly irregular rhythm. No rubs, gallops or murmurs. Lungs: clear Abdomen: soft, nontender, nondistended. Extremities: no cyanosis, clubbing, rash, edema Neuro: alert & oriented x 3, cranial nerves grossly intact. moves all 4   extremities w/o difficulty. Affect pleasant.  ECG: Atrial fibrillation 103 bpm   ASSESSMENT & PLAN: HFrEF Suspect possibly tachymediated. Discovered in setting of Afib with RVR in December 2023. Echo 12/23: EF 45-50%, RV okay, severe LAE. No prior echo for comparison. -Echo 03/24 in setting of Afib with RVR: EF 35-40% -Back in Afib today. Fortunately, he is well-compensated from HF standpoint. Need to restore SR. NYHA II GDMT  Diuretic-Furosemide 40 mg daily BB-Metoprolol xl 200 mg daily Ace/ARB/ARNI-Entresto 24/26 mg BID MRA-Start spiro 12.5 mg daily SGLT2i-Jardiance 10 mg daily -CMET/BNP today, BMET again in 1 week -Would repeat echo once he is maintaining SR. If EF does not improve with GDMT and SR, consider ischemic workup.  Paroxysmal atrial fibrillation -Diagnosed 12/23 -Successful DCCV in March 2024 -Back in Afib today. Will start amiodarone 200 mg BID and perform TEE/DCCV in 2 weeks. -Anticoagulated with Eliquis, has missed a few doses. Stressed importance of med adherence -Consider sleep study at next visit  DM II -Last A1c 6.6% -On metformin and jardiance -Per PCP  HLD Continue Atorvastatin  HTN -BP controlled -Continue current management  He was provided samples of eliquis, jardiance and entresto.  Medicaid pending.  Referred to HFSW (PCP, Medications, Transportation, ETOH Abuse, Drug Abuse,  Insurance, Financial ): Yes. Assisting with transportation. Gifted food bag from [facility] [employer]. Refer to Pharmacy: Yes or No Refer to Home Health: Yes on No Refer to Advanced Heart Failure Clinic: Yes Refer to General Cardiology: No, already established with Dr. Skains  Follow up  3-4 weeks. Will restore SR and optimize HF management. Once EF recovers, can likely graduate from HF clinic and f/u with Cardiology.  Lindsay Finch PA  Patient seen and examined with the above-signed Advanced Practice Provider and/or Housestaff. I personally reviewed laboratory data, imaging studies and relevant notes. I independently examined the patient and formulated the important aspects of the plan. I have edited the note to reflect any of my changes or salient points. I have personally discussed the plan with the patient and/or family.  62 y/o male as above with recurrent HF in setting of recurrent AF with RVR. S/p DC-CV in 3/24  NYHA II. Volume status ok  General:  Sitting in chair No resp difficulty HEENT: normal Neck: supple. no JVD. Carotids 2+ bilat; no bruits. No lymphadenopathy or thryomegaly appreciated. Cor: PMI nondisplaced. irregular rate & rhythm. No rubs, gallops or murmurs. Lungs: clear Abdomen: soft, nontender, nondistended. No hepatosplenomegaly. No bruits or masses. Good bowel sounds. Extremities: no cyanosis, clubbing, rash, edema Neuro: alert & orientedx3, cranial nerves grossly intact. moves all 4 extremities w/o difficulty. 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 Start amio 200 bid. Continue Eliquis. Arrange DC-CV. If EF doesn't improve post DC-CV will need cath +/- cMRI.   GDMT as above.   Norvel Wenker, MD  1:54 PM  

## 2023-01-04 NOTE — Telephone Encounter (Deleted)
{  TRANSPORTATION [id number]}

## 2023-01-04 NOTE — Patient Instructions (Signed)
Medication Changes:  START Spironolactone 12.5 mg (1/2 tab) Daily  START Amiodarone 200 mg Twice daily until 01/15/23 then decrease to 200 mg Daily  Lab Work:  Labs done today, your results will be available in MyChart, we will contact you for abnormal readings.  Testing/Procedures:  Your physician has requested that you have a TEE/Cardioversion. During a TEE, sound waves are used to create images of your heart. It provides your doctor with information about the size and shape of your heart and how well your heart's chambers and valves are working. In this test, a transducer is attached to the end of a flexible tube that is guided down you throat and into your esophagus (the tube leading from your mouth to your stomach) to get a more detailed image of your heart. Once the TEE has determined that a blood clot is not present, the cardioversion begins. Electrical Cardioversion uses a jolt of electricity to your heart either through paddles or wired patches attached to your chest. This is a controlled, usually prescheduled, procedure. This procedure is done at the hospital and you are not awake during the procedure. You usually go home the day of the procedure. Please see the instruction sheet given to you today for more information.   Special Instructions // Education:  Do the following things EVERYDAY: Weigh yourself in the morning before breakfast. Write it down and keep it in a log. Take your medicines as prescribed Eat low salt foods--Limit salt (sodium) to 2000 mg per day.  Stay as active as you can everyday Limit all fluids for the day to less than 2 liters   You are scheduled for a TEE (Transesophageal Echocardiogram) Guided Cardioversion on Tuesday, April 30 with Dr. Gala Romney.    Please arrive at the [facility] (Main Entrance A) at [facility]: [address] [place], [place] [address] at 10:00 AM.   DIET:  Nothing to eat or drink after midnight except a sip of water with  medications (see medication instructions below)  MEDICATION INSTRUCTIONS:       Continue taking your anticoagulant (blood thinner): Apixaban (Eliquis).    You must have a responsible person to drive you home and stay in the waiting area during your procedure. Failure to do so could result in cancellation.  Bring your insurance cards.  *Special Note: Every effort is made to have your procedure done on time. Occasionally there are emergencies that occur at the hospital that may cause delays. Please be patient if a delay does occur.    Follow-Up in: 3-4 weeks   At the [facility], you and your health needs are our priority. As part of our continuing mission to provide you with exceptional heart care, we have created designated Provider Care Teams. These Care Teams include your primary Cardiologist (physician) and Advanced Practice Providers (APPs- Physician Assistants and Nurse Practitioners) who all work together to provide you with the care you need, when you need it.   You may see any of the following providers on your designated Care Team at your next follow up: Dr Arvilla Meres Dr Marca Ancona Dr. Marcos Eke, NP Robbie Lis, [place] [facility] [place], [place] Brynda Peon, NP Karle Plumber, PharmD   Please be sure to bring in all your medications bottles to every appointment.    Thank you for choosing [facility]

## 2023-01-04 NOTE — Progress Notes (Signed)
Medication Samples have been provided to the patient.  Drug name: Jardiance       Strength:         Qty: 4  LOT: [id number]  Exp.Date: 07/2024, 09/2024, 09/2024  Dosing instructions: Take 1 tab Daily  The patient has been instructed regarding the correct time, dose, and frequency of taking this medication, including desired effects and most common side effects.   Jonathan Gonzalez 12:10 PM 01/04/2023   Medication Samples have been provided to the patient.  Drug name: Sherryll Burger       Strength: 24/26mg         Qty: 3  LOT: [id number]  Exp.Date: 04/2024  Dosing instructions: Take 1 tab Twice daily   The patient has been instructed regarding the correct time, dose, and frequency of taking this medication, including desired effects and most common side effects.   Jonathan Gonzalez 12:11 PM 01/04/2023   Medication Samples have been provided to the patient.  Drug name: Eliquis       Strength: 5 mg        Qty: 2  LOT: [id number]  Exp.Date: 02/2024  Dosing instructions: Take 1 tab Twice daily   The patient has been instructed regarding the correct time, dose, and frequency of taking this medication, including desired effects and most common side effects.   Jonathan Gonzalez 12:12 PM 01/04/2023

## 2023-01-04 NOTE — Telephone Encounter (Signed)
CSW arranged roundtrip transportation via [facility] for procedure on 01-15-23. Lasandra Beech, LCSW, CCSW-MCS [phone]   01/04/2023  Jonathan Gonzalez DOB: 1961-07-24 MRN: [id number]   RIDER WAIVER AND RELEASE OF LIABILITY  For the purposes of helping with transportation needs, [facility] partners with outside transportation providers (taxi companies, Katie, Catering manager.) to give [employer] patients or other approved people the choice of on-demand rides [employer]") to our buildings for non-emergency visits.  By using [employer], I, the person signing this document, on behalf of myself and/or any legal minors (in my care using the [employer]), agree:  Science writer given to me are supplied by independent, outside transportation providers who do not work for, or have any affiliation with, [employer]. [facility] is not a transportation company. [facility] has no control over the quality or safety of the rides I get using [employer]. [facility] has no control over whether any outside ride will happen on time or not. [facility] gives no guarantee on the reliability, quality, safety, or availability on any rides, or that no mistakes will happen. I know and accept that traveling by vehicle (car, truck, SVU, Zenaida Niece, bus, taxi, etc.) has risks of serious injuries such as disability, being paralyzed, and death. I know and agree the risk of using [employer] is mine alone, and not [employer]. Transport Services are provided "as is" and as are available. The transportation providers are in charge for all inspections and care of the vehicles used to provide these rides. I agree not to take legal action against [facility], its agents, employees, officers, directors, representatives, insurers, attorneys, assigns, successors, subsidiaries, and affiliates at any time for any reasons related directly or indirectly to using [employer]. I also agree not to take  legal action against [facility] or its affiliates for any injury, death, or damage to property caused by or related to using [employer]. I have read this Waiver and Release of Liability, and I understand the terms used in it and their legal meaning. This Waiver is freely and voluntarily given with the understanding that my right (or any legal minors) to legal action against  relating to [employer] is knowingly given up to use these services.   I attest that I read the Ride Waiver and Release of Liability to Jonathan Gonzalez, gave [name] the opportunity to ask questions and answered the questions asked (if any). I affirm that Jonathan Gonzalez then provided consent for assistance with transportation.     Marcy Siren

## 2023-01-04 NOTE — Transportation (Signed)
.  transportat

## 2023-01-04 NOTE — Progress Notes (Cosign Needed Addendum)
HEART & VASCULAR TRANSITION OF CARE CONSULT NOTE     Referring Physician: Dr. Antony Contras Primary Care: Bertram Denver, NP Primary Cardiologist: Dr. Anne Fu  HPI: Referred to clinic by Dr. Antony Contras with Internal Medicine for heart failure consultation. 62 y.o. male with history of HFrEF, atrial fibrillation (diagnosed 12/23), HTN, HLD, DM.   Patient admitted 09/14/22 with new afib with RVR. Treated with rate control. 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 Echo demonstrated EF 45-50%, RV okay, severe LAE. Plan was for cardioversion at a later date but cancelled the procedure d/t passing of his wife. He later missed doses of eliquis and ran out of metoprolol and farxiga. He was admitted with Afib with RVR and a/c CHF in March 2024. EF down slightly further to 35-40%. He was diuresed and GDMT titrated. He underwent successful DCCV during the admission.  He is here today for hospital follow-up. Denies dyspnea, orthopnea, PND or lower extremity edema. He does not have a scale to weigh. Has been taking all medications exception for eliquis which he ran out of a few days ago.  His wife passed away recently. Currently lives at a motel with his autistic son. Lost his job at [employer] this past week.   Medicaid is pending. Has some trouble paying for medications. Takes the bus to appointments.    Review of Systems: [y] = yes, [ ]  = no   General: Weight gain [ ] ; Weight loss [ ] ; Anorexia [ ] ; Fatigue [ ] ; Fever [ ] ; Chills [ ] ; Weakness [ ]   Cardiac: Chest pain/pressure [ ] ; Resting SOB [ ] ; Exertional SOB [ ] ; Orthopnea [ ] ; Pedal Edema [ ] ; Palpitations [ ] ; Syncope [ ] ; Presyncope [ ] ; Paroxysmal nocturnal dyspnea[ ]   Pulmonary: Cough [ ] ; Wheezing[ ] ; Hemoptysis[ ] ; Sputum [ ] ; Snoring [ ]   GI: Vomiting[ ] ; Dysphagia[ ] ; Melena[ ] ; Hematochezia [ ] ; Heartburn[ ] ; Abdominal pain [ ] ; Constipation [ ] ; Diarrhea [ ] ; BRBPR [ ]   GU: Hematuria[ ] ; Dysuria [ ] ;  Nocturia[ ]   Vascular: Pain in legs with walking [ ] ; Pain in feet with lying flat [ ] ; Non-healing sores [ ] ; Stroke [ ] ; TIA [ ] ; Slurred speech [ ] ;  Neuro: Headaches[ ] ; Vertigo[ ] ; Seizures[ ] ; Paresthesias[ ] ;Blurred vision [ ] ; Diplopia [ ] ; Vision changes [ ]   Ortho/Skin: Arthritis [ ] ; Joint pain [ ] ; Muscle pain [ ] ; Joint swelling [ ] ; Back Pain [ ] ; Rash [ ]   Psych: Depression[ ] ; Anxiety[ ]   Heme: Bleeding problems [ ] ; Clotting disorders [ ] ; Anemia [ ]   Endocrine: Diabetes [Y ]; Thyroid dysfunction[ ]    Past Medical History:  Diagnosis Date   Diabetes mellitus without complication    Hyperlipidemia    Hypertension     Current Outpatient Medications  Medication Sig Dispense Refill   amiodarone (PACERONE) 200 MG tablet Take 1 tablet (200 mg total) by mouth 2 (two) times daily for 12 days, THEN 1 tablet (200 mg total) daily for 18 days. 42 tablet 0   atorvastatin (LIPITOR) 10 MG tablet Take 1 tablet (10 mg total) by mouth daily. 90 tablet 1   Blood Glucose Monitoring Suppl (TRUE METRIX METER) w/Device KIT Use as instructed. Check blood glucose level by fingerstick twice per day. E11.65 1 kit 0   empagliflozin (JARDIANCE) 10 MG TABS tablet Take 1 tablet (10 mg total) by mouth daily. 90 tablet 1   furosemide (LASIX) 40 MG tablet Take  1 tablet (40 mg total) by mouth daily. 90 tablet 1   glucose blood (TRUE METRIX BLOOD GLUCOSE TEST) test strip Use as instructed. Check blood glucose level by fingerstick twice per day. NEEDS PASS 100 each 12   hydrOXYzine (VISTARIL) 25 MG capsule Take 1-2 capsules (25-50 mg total) by mouth at bedtime. For sleep 60 capsule 3   Iron Polysacch Cmplx-B12-FA (POLY-IRON 150 FORTE) 150-0.025-1 MG CAPS Take by mouth every Monday, Wednesday, and Friday. 12 capsule 0   metFORMIN (GLUCOPHAGE) 500 MG tablet Take 1 tablet (500 mg total) by mouth 2 (two) times daily with a meal. 180 tablet 1   metoprolol (TOPROL XL) 200 MG 24 hr tablet Take 1 tablet (200 mg  total) by mouth daily. 90 tablet 1   omeprazole (PRILOSEC) 20 MG capsule Take 1 capsule (20 mg total) by mouth daily. FOR ACID REFLUX 90 capsule 1   sacubitril-valsartan (ENTRESTO) 24-26 MG Take 1 tablet by mouth 2 (two) times daily. 180 tablet 1   spironolactone (ALDACTONE) 25 MG tablet Take 0.5 tablets (12.5 mg total) by mouth daily. 15 tablet 1   apixaban (ELIQUIS) 5 MG TABS tablet Take 1 tablet (5 mg total) by mouth 2 (two) times daily. 180 tablet 1   No current facility-administered medications for this encounter.    No Known Allergies    Social History   Socioeconomic History   Marital status: Widowed    Spouse name: Not on file   Number of children: 1   Years of education: Not on file   Highest education level: Not on file  Occupational History   Occupation: [employer]  Tobacco Use   Smoking status: Never   Smokeless tobacco: Never   Tobacco comments:    Never smoke 10/08/22  Vaping Use   Vaping Use: Never used  Substance and Sexual Activity   Alcohol use: Never   Drug use: Never   Sexual activity: Not Currently  Other Topics Concern   Not on file  Social History Narrative   Not on file   Social Determinants of Health   Financial Resource Strain: Medium Risk (12/05/2022)   Overall Financial Resource Strain (CARDIA)    Difficulty of Paying Living Expenses: Somewhat hard  Food Insecurity: No Food Insecurity (12/05/2022)   Hunger Vital Sign    Worried About Running Out of Food in the Last Year: Never true    Ran Out of Food in the Last Year: Never true  Transportation Needs: Unmet Transportation Needs (01/04/2023)   PRAPARE - Administrator, Civil Service (Medical): Yes    Lack of Transportation (Non-Medical): Yes  Physical Activity: Not on file  Stress: Not on file  Social Connections: Not on file  Intimate Partner Violence: Not At Risk (12/04/2022)   Humiliation, Afraid, Rape, and Kick questionnaire    Fear of Current or Ex-Partner: No     Emotionally Abused: No    Physically Abused: No    Sexually Abused: No     No family history on file.  Vitals:   01/04/23 1126  BP: 100/62  Pulse: 87  SpO2: 96%  Weight: 106.1 kg (234 lb)    PHYSICAL EXAM: General:  Well appearing. Ambulated into clinic. HEENT: normal Neck: No JVD. Carotids 2+ bilat; no bruits.  Cor:  Irregularly irregular rhythm. No rubs, gallops or murmurs. Lungs: clear Abdomen: soft, nontender, nondistended. Extremities: no cyanosis, clubbing, rash, edema Neuro: alert & oriented x 3, cranial nerves grossly intact. moves all 4  extremities w/o difficulty. Affect pleasant.  ECG: Atrial fibrillation 103 bpm   ASSESSMENT & PLAN: HFrEF Suspect possibly tachymediated. Discovered in setting of Afib with RVR in December 2023. Echo 12/23: EF 45-50%, RV okay, severe LAE. No prior echo for comparison. -Echo 03/24 in setting of Afib with RVR: EF 35-40% -Back in Afib today. Fortunately, he is well-compensated from HF standpoint. Need to restore SR. NYHA II GDMT  Diuretic-Furosemide 40 mg daily BB-Metoprolol xl 200 mg daily Ace/ARB/ARNI-Entresto 24/26 mg BID MRA-Start spiro 12.5 mg daily SGLT2i-Jardiance 10 mg daily -CMET/BNP today, BMET again in 1 week -Would repeat echo once he is maintaining SR. If EF does not improve with GDMT and SR, consider ischemic workup.  Paroxysmal atrial fibrillation -Diagnosed 12/23 -Successful DCCV in March 2024 -Back in Afib today. Will start amiodarone 200 mg BID and perform TEE/DCCV in 2 weeks. -Anticoagulated with Eliquis, has missed a few doses. Stressed importance of med adherence -Consider sleep study at next visit  DM II -Last A1c 6.6% -On metformin and jardiance -Per PCP  HLD Continue Atorvastatin  HTN -BP controlled -Continue current management  He was provided samples of eliquis, jardiance and entresto.  Medicaid pending.  Referred to HFSW (PCP, Medications, Transportation, ETOH Abuse, Drug Abuse,  Insurance, Financial ): Yes. Assisting with transportation. Gifted food bag from HF [employer]. Refer to Pharmacy: Yes or No Refer to Home Health: Yes on No Refer to [facility]: Yes Refer to General Cardiology: No, already established with Dr. Anne Fu  Follow up  3-4 weeks. Will restore SR and optimize HF management. Once EF recovers, can likely graduate from HF clinic and f/u with Cardiology.  Anna Genre PA  Patient seen and examined with the above-signed Advanced Practice Provider and/or Housestaff. I personally reviewed laboratory data, imaging studies and relevant notes. I independently examined the patient and formulated the important aspects of the plan. I have edited the note to reflect any of my changes or salient points. I have personally discussed the plan with the patient and/or family.  62 y/o male as above with recurrent HF in setting of recurrent AF with RVR. S/p DC-CV in 3/24  NYHA II. Volume status ok  General:  Sitting in chair No resp difficulty HEENT: normal Neck: supple. no JVD. Carotids 2+ bilat; no bruits. No lymphadenopathy or thryomegaly appreciated. Cor: PMI nondisplaced. irregular rate & rhythm. No rubs, gallops or murmurs. Lungs: clear Abdomen: soft, nontender, nondistended. No hepatosplenomegaly. No bruits or masses. Good bowel sounds. Extremities: no cyanosis, clubbing, rash, edema Neuro: alert & orientedx3, cranial nerves grossly intact. moves all 4 extremities w/o difficulty. 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 Start amio 200 bid. Continue Eliquis. Arrange DC-CV. If EF doesn't improve post DC-CV will need cath +/- cMRI.   GDMT as above.   Arvilla Meres, MD  1:54 PM

## 2023-01-06 NOTE — Addendum Note (Signed)
Encounter addended by: Andrey Farmer, PA-C on: 01/06/2023 7:27 PM  Actions taken: Clinical Note Signed

## 2023-01-07 ENCOUNTER — Ambulatory Visit: Payer: Self-pay | Admitting: Cardiology

## 2023-01-09 NOTE — Addendum Note (Signed)
Encounter addended by: Dolores Patty, MD on: 01/09/2023 1:55 PM  Actions taken: Level of Service modified, Clinical Note Signed

## 2023-01-14 ENCOUNTER — Telehealth (HOSPITAL_COMMUNITY): Payer: Self-pay | Admitting: Licensed Clinical Social Worker

## 2023-01-14 NOTE — Pre-Procedure Instructions (Signed)
Spoke with patient regarding procedure instructions for tomorrow.  Arrival time is 10:30.  Nothing to eat or drink after midnight.  You will need a responsible person to drive you home and stay with you for 24 hours.  He takes Eliquis- he says he hasn't missed any doses.  He will take dose in the morning.

## 2023-01-14 NOTE — Telephone Encounter (Signed)
H&V Care Navigation CSW Progress Note  Clinical Social Worker called pt to confirm he is still ok for procedure tomorrow and to remind of appt details.  Pt is aware of appt and reports no barriers to attending.  Told pt of pick up time- he will be waiting outside.  CSW provided pt with CSW number and number to cab company with [employer] to ensure he has no delays in getting a ride home.     SDOH Screenings   Food Insecurity: No Food Insecurity (12/05/2022)  Housing: Medium Risk (01/04/2023)  Transportation Needs: Unmet Transportation Needs (01/04/2023)  Utilities: Not At Risk (12/04/2022)  Alcohol Screen: Low Risk  (12/05/2022)  Depression (PHQ2-9): Medium Risk (12/28/2022)  Financial Resource Strain: Medium Risk (12/05/2022)  Tobacco Use: Low Risk  (01/04/2023)     Burna Sis, LCSW Clinical Social Worker [facility] Desk#: [phone] Cell#: [phone]

## 2023-01-15 ENCOUNTER — Encounter (HOSPITAL_COMMUNITY)
Admission: RE | Disposition: A | Discharge status: Home / Self Care | Payer: Self-pay | Source: Home / Self Care | Attending: Internal Medicine

## 2023-01-15 ENCOUNTER — Ambulatory Visit (HOSPITAL_COMMUNITY)
Admission: RE | Admit: 2023-01-15 | Discharge: 2023-01-15 | Disposition: A | Discharge status: Home / Self Care | Payer: 59 | Attending: Internal Medicine | Admitting: Internal Medicine

## 2023-01-15 ENCOUNTER — Ambulatory Visit (HOSPITAL_COMMUNITY): Payer: 59 | Admitting: Anesthesiology

## 2023-01-15 ENCOUNTER — Ambulatory Visit (HOSPITAL_BASED_OUTPATIENT_CLINIC_OR_DEPARTMENT_OTHER): Payer: 59 | Admitting: Anesthesiology

## 2023-01-15 ENCOUNTER — Encounter (HOSPITAL_COMMUNITY): Payer: Self-pay | Admitting: Internal Medicine

## 2023-01-15 ENCOUNTER — Ambulatory Visit (HOSPITAL_BASED_OUTPATIENT_CLINIC_OR_DEPARTMENT_OTHER): Payer: 59

## 2023-01-15 ENCOUNTER — Other Ambulatory Visit: Payer: Self-pay

## 2023-01-15 DIAGNOSIS — I4891 Unspecified atrial fibrillation: Secondary | ICD-10-CM

## 2023-01-15 DIAGNOSIS — I110 Hypertensive heart disease with heart failure: Secondary | ICD-10-CM | POA: Insufficient documentation

## 2023-01-15 DIAGNOSIS — E785 Hyperlipidemia, unspecified: Secondary | ICD-10-CM | POA: Insufficient documentation

## 2023-01-15 DIAGNOSIS — I4819 Other persistent atrial fibrillation: Secondary | ICD-10-CM

## 2023-01-15 DIAGNOSIS — E119 Type 2 diabetes mellitus without complications: Secondary | ICD-10-CM | POA: Insufficient documentation

## 2023-01-15 DIAGNOSIS — Z79899 Other long term (current) drug therapy: Secondary | ICD-10-CM | POA: Diagnosis not present

## 2023-01-15 DIAGNOSIS — I5022 Chronic systolic (congestive) heart failure: Secondary | ICD-10-CM | POA: Diagnosis not present

## 2023-01-15 DIAGNOSIS — Z7984 Long term (current) use of oral hypoglycemic drugs: Secondary | ICD-10-CM | POA: Insufficient documentation

## 2023-01-15 DIAGNOSIS — I480 Paroxysmal atrial fibrillation: Secondary | ICD-10-CM | POA: Insufficient documentation

## 2023-01-15 DIAGNOSIS — I509 Heart failure, unspecified: Secondary | ICD-10-CM | POA: Diagnosis not present

## 2023-01-15 DIAGNOSIS — Z7901 Long term (current) use of anticoagulants: Secondary | ICD-10-CM | POA: Insufficient documentation

## 2023-01-15 HISTORY — PX: TEE WITHOUT CARDIOVERSION: SHX5443

## 2023-01-15 HISTORY — DX: Gastro-esophageal reflux disease without esophagitis: K21.9

## 2023-01-15 HISTORY — PX: CARDIOVERSION: SHX1299

## 2023-01-15 LAB — POCT I-STAT, CHEM 8
BUN: 12 mg/dL (ref 8–23)
Calcium, Ion: 1.22 mmol/L (ref 1.15–1.40)
Chloride: 105 mmol/L (ref 98–111)
Creatinine, Ser: 1.1 mg/dL (ref 0.61–1.24)
Glucose, Bld: 133 mg/dL — ABNORMAL HIGH (ref 70–99)
HCT: 52 % (ref 39.0–52.0)
Hemoglobin: 17.7 g/dL — ABNORMAL HIGH (ref 13.0–17.0)
Potassium: 5.2 mmol/L — ABNORMAL HIGH (ref 3.5–5.1)
Sodium: 141 mmol/L (ref 135–145)
TCO2: 32 mmol/L (ref 22–32)

## 2023-01-15 SURGERY — ECHOCARDIOGRAM, TRANSESOPHAGEAL
Anesthesia: Monitor Anesthesia Care

## 2023-01-15 MED ORDER — PHENYLEPHRINE 80 MCG/ML (10ML) SYRINGE FOR IV PUSH (FOR BLOOD PRESSURE SUPPORT)
PREFILLED_SYRINGE | INTRAVENOUS | Status: DC | PRN
  Administered 2023-01-15 (×2): 160 ug via INTRAVENOUS

## 2023-01-15 MED ORDER — LIDOCAINE HCL (CARDIAC) PF 100 MG/5ML IV SOSY
PREFILLED_SYRINGE | INTRAVENOUS | Status: DC | PRN
  Administered 2023-01-15: 100 mg via INTRAVENOUS

## 2023-01-15 MED ORDER — PROPOFOL 10 MG/ML IV BOLUS
INTRAVENOUS | Status: DC | PRN
  Administered 2023-01-15 (×2): 30 mg via INTRAVENOUS

## 2023-01-15 MED ORDER — PROPOFOL 500 MG/50ML IV EMUL
INTRAVENOUS | Status: DC | PRN
  Administered 2023-01-15: 100 ug/kg/min via INTRAVENOUS

## 2023-01-15 MED ORDER — SODIUM CHLORIDE 0.9 % IV SOLN: INTRAVENOUS | Status: DC

## 2023-01-15 SURGICAL SUPPLY — 1 items: ELECT DEFIB PAD ADLT CADENCE (PAD) ×2 IMPLANT

## 2023-01-15 NOTE — Transfer of Care (Signed)
Immediate Anesthesia Transfer of Care Note  Patient: [name]  Procedure(s) Performed: TRANSESOPHAGEAL ECHOCARDIOGRAM CARDIOVERSION  Patient Location: Cath Lab  Anesthesia Type:MAC  Level of Consciousness: drowsy  Airway & Oxygen Therapy: Patient Spontanous Breathing and Patient connected to nasal cannula oxygen  Post-op Assessment: Report given to RN and Post -op Vital signs reviewed and stable  Post vital signs: Reviewed and stable  Last Vitals:  Vitals Value Taken Time  BP 105/67 01/15/23 1230  Temp    Pulse 55 01/15/23 1233  Resp 16 01/15/23 1233  SpO2 91 % 01/15/23 1233  Vitals shown include unvalidated device data.  Last Pain:  Vitals:   01/15/23 0949  TempSrc: Temporal  PainSc: 0-No pain         Complications: No notable events documented.

## 2023-01-15 NOTE — CV Procedure (Signed)
   TRANSESOPHAGEAL ECHOCARDIOGRAM GUIDED DIRECT CURRENT CARDIOVERSION  NAME:  BAUER AUSBORN   MRN: [id number] DOB:  [birth year]   ADMIT DATE: 01/15/2023  INDICATIONS:   PROCEDURE:   Informed consent was obtained prior to the procedure. The risks, benefits and alternatives for the procedure were discussed and the patient comprehended these risks.  Risks include, but are not limited to, cough, sore throat, vomiting, nausea, somnolence, esophageal and stomach trauma or perforation, bleeding, low blood pressure, aspiration, pneumonia, infection, trauma to the teeth and death.    After a procedural time-out, the oropharynx was anesthetized and the patient was sedated by the anesthesia service. The transesophageal probe was inserted in the esophagus and stomach without difficulty and multiple views were obtained.   FINDINGS:  LEFT VENTRICLE: EF = Hard to assess with rapid AF. ~30%  RIGHT VENTRICLE: Moderately depressed  LEFT ATRIUM: Severely dilated  LEFT ATRIAL APPENDAGE: No clot  RIGHT ATRIUM: Mildly dilated  AORTIC VALVE:  Trileaflet. No AS/AI  MITRAL VALVE:    Mild MR  TRICUSPID VALVE: Mild TR  PULMONIC VALVE: Trivial PR  INTERATRIAL SEPTUM: Bulges L-> R. Small PFO  PERICARDIUM: No effusion.  DESCENDING AORTA: Mild plaque   CARDIOVERSION:     Indications:  Atrial Fibrillation  Procedure Details:  Once the TEE was complete, the patient had the defibrillator pads placed in the anterior and posterior position. Once an appropriate level of sedation was achieved, the patient received a single biphasic, synchronized 200J shock with prompt conversion to sinus rhythm. No apparent complications.   Arvilla Meres, MD  12:37 PM

## 2023-01-15 NOTE — Interval H&P Note (Signed)
History and Physical Interval Note:  01/15/2023 12:11 PM  Jonathan Gonzalez  has presented today for surgery, with the diagnosis of AFIB.  The various methods of treatment have been discussed with the patient and family. After consideration of risks, benefits and other options for treatment, the patient has consented to  Procedure(s): TRANSESOPHAGEAL ECHOCARDIOGRAM (N/A) CARDIOVERSION (N/A) as a surgical intervention.  The patient's history has been reviewed, patient examined, no change in status, stable for surgery.  I have reviewed the patient's chart and labs.  Questions were answered to the patient's satisfaction.     Vianka Ertel

## 2023-01-15 NOTE — Anesthesia Preprocedure Evaluation (Signed)
Anesthesia Evaluation  Patient identified by MRN, date of birth, ID band Patient awake    Reviewed: Allergy & Precautions, NPO status , Patient's Chart, lab work & pertinent test results  Airway Mallampati: II  TM Distance: >3 FB Neck ROM: Full    Dental no notable dental hx. (+) Teeth Intact, Dental Advisory Given   Pulmonary    Pulmonary exam normal breath sounds clear to auscultation       Cardiovascular hypertension, Pt. on medications +CHF  Normal cardiovascular exam+ dysrhythmias Atrial Fibrillation  Rhythm:Regular Rate:Normal  12/03/2022 ECHo 1. Left ventricular ejection fraction, by estimation, is 35 to 40%. The  left ventricle has moderately decreased function. The left ventricle  demonstrates global hypokinesis. Left ventricular diastolic function could  not be evaluated.   2. Right ventricular systolic function is mildly reduced. The right  ventricular size is normal. There is normal pulmonary artery systolic  pressure. The estimated right ventricular systolic pressure is 35.5 mmHg.   3. Left atrial size was mildly dilated.   4. The mitral valve is abnormal. Moderate mitral valve regurgitation. No  evidence of mitral stenosis.   5. Tricuspid valve regurgitation is mild to moderate.   6. The aortic valve is tricuspid. Aortic valve regurgitation is not  visualized. No aortic stenosis is present.   7. The inferior vena cava is dilated in size with >50% respiratory  variability, suggesting right atrial pressure of 8 mmHg.     Neuro/Psych    GI/Hepatic Neg liver ROS,GERD  ,,  Endo/Other  diabetes, Type 2    Renal/GU Lab Results      Component                Value               Date                      CREATININE               1.24                12/05/2022                   K                        3.2 (L)             12/05/2022                     Musculoskeletal   Abdominal  (+) + obese (BMI 35.64)   Peds  Hematology Lab Results      Component                Value               Date                      WBC                      10.1                12/05/2022                HGB                      12.1 (L)  12/05/2022                HCT                      39.5                12/05/2022                         PLT                      375                 12/05/2022              Anesthesia Other Findings   Reproductive/Obstetrics                             Anesthesia Physical Anesthesia Plan  ASA: 3  Anesthesia Plan: MAC   Post-op Pain Management:    Induction: Intravenous  PONV Risk Score and Plan: Treatment may vary due to age or medical condition and Propofol infusion  Airway Management Planned: Nasal Cannula and Natural Airway  Additional Equipment: None  Intra-op Plan:   Post-operative Plan:   Informed Consent: I have reviewed the patients History and Physical, chart, labs and discussed the procedure including the risks, benefits and alternatives for the proposed anesthesia with the patient or authorized representative who has indicated his/her understanding and acceptance.     Dental advisory given  Plan Discussed with:   Anesthesia Plan Comments:         Anesthesia Quick Evaluation

## 2023-01-16 ENCOUNTER — Encounter (HOSPITAL_COMMUNITY): Payer: Self-pay | Admitting: Internal Medicine

## 2023-01-16 NOTE — Anesthesia Postprocedure Evaluation (Signed)
Anesthesia Post Note  Patient: Jonathan Gonzalez  Procedure(s) Performed: TRANSESOPHAGEAL ECHOCARDIOGRAM CARDIOVERSION     Patient location during evaluation: PACU Anesthesia Type: MAC Level of consciousness: awake and alert Pain management: pain level controlled Vital Signs Assessment: post-procedure vital signs reviewed and stable Respiratory status: spontaneous breathing, nonlabored ventilation and respiratory function stable Cardiovascular status: blood pressure returned to baseline and stable Postop Assessment: no apparent nausea or vomiting Anesthetic complications: no   No notable events documented.  Last Vitals:  Vitals:   01/15/23 1251 01/15/23 1300  BP: 103/80 111/85  Pulse: 97 93  Resp: 20 18  Temp:    SpO2: 97% (!) 78%    Last Pain:  Vitals:   01/15/23 0949  TempSrc: Temporal  PainSc: 0-No pain                 Lowella Curb

## 2023-01-21 ENCOUNTER — Other Ambulatory Visit: Payer: Self-pay

## 2023-01-28 NOTE — Progress Notes (Incomplete)
HEART & VASCULAR CLINIC NOTE    Referring Physician: Dr. Antony Contras Primary Care: Bertram Denver, NP Primary Cardiologist: Dr. Anne Fu  HPI: Referred to clinic by Dr. Antony Contras with Internal Medicine for heart failure consultation. 62 y.o. male with history of HFrEF, atrial fibrillation (diagnosed 12/23), HTN, HLD, DM.   Patient admitted 09/14/22 with new afib with RVR. Treated with rate control. 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 Echo demonstrated EF 45-50%, RV okay, severe LAE. Plan was for cardioversion at a later date but cancelled the procedure d/t passing of his wife. He later missed doses of eliquis and ran out of metoprolol and farxiga. He was admitted with Afib with RVR and a/c CHF in March 2024. EF down slightly further to 35-40%. He was diuresed and GDMT titrated. He underwent successful DCCV during the admission.  At AHF TOC f/u 4/24 he was found to be in a fib. Started on amiodarone and scheduled for TEE/DCCV. Now s/p TEE/DCCV 01/15/23 to NSR. TEE EF ~30%.  Today he returns for AHF follow up. Overall feeling ***. Denies palpitations, CP, dizziness, edema, or PND/Orthopnea. *** SOB. Appetite ok. No fever or chills. Weight at home *** pounds. Taking all medications. ETOH, smoking ***   His wife passed away recently. Currently lives at a motel with his autistic son. Lost his job at [employer] this past week. *** Medicaid is pending. Has some trouble paying for medications. Takes the bus to appointments.    Labs reviewed 4/24: K 5.2, SCr 1.1  Cardiac studies reviewed:  TEE 4/24: EF diff to assess with patient in rapid a fib ~30%. RV mod depressed. LA sev dilated. Mild MR/TR, LAA no clot. Small PFO Echo 03/24 in setting of Afib with RVR: EF 35-40% Echo 12/23: EF 45-50%, RV okay, severe LAE.    Past Medical History:  Diagnosis Date   Diabetes mellitus without complication (HCC)    GERD (gastroesophageal reflux disease)    Hyperlipidemia     Hypertension     Current Outpatient Medications  Medication Sig Dispense Refill   amiodarone (PACERONE) 200 MG tablet Take 1 tablet (200 mg total) by mouth 2 (two) times daily for 12 days, THEN 1 tablet (200 mg total) daily for 18 days. 42 tablet 0   apixaban (ELIQUIS) 5 MG TABS tablet Take 1 tablet (5 mg total) by mouth 2 (two) times daily. 180 tablet 1   atorvastatin (LIPITOR) 10 MG tablet Take 1 tablet (10 mg total) by mouth daily. 90 tablet 1   Blood Glucose Monitoring Suppl (TRUE METRIX METER) w/Device KIT Use as instructed. Check blood glucose level by fingerstick twice per day. E11.65 1 kit 0   empagliflozin (JARDIANCE) 10 MG TABS tablet Take 1 tablet (10 mg total) by mouth daily. 90 tablet 1   furosemide (LASIX) 40 MG tablet Take 1 tablet (40 mg total) by mouth daily. 90 tablet 1   glucose blood (TRUE METRIX BLOOD GLUCOSE TEST) test strip Use as instructed. Check blood glucose level by fingerstick twice per day. NEEDS PASS 100 each 12   hydrOXYzine (VISTARIL) 25 MG capsule Take 1-2 capsules (25-50 mg total) by mouth at bedtime. For sleep (Patient taking differently: Take 25 mg by mouth at bedtime.) 60 capsule 3   iron polysaccharides (NIFEREX) 150 MG capsule Take 150 mg by mouth daily.     metFORMIN (GLUCOPHAGE) 500 MG tablet Take 1 tablet (500 mg total) by mouth 2 (two) times daily with a meal. 180 tablet 1  metoprolol (TOPROL XL) 200 MG 24 hr tablet Take 1 tablet (200 mg total) by mouth daily. 90 tablet 1   omeprazole (PRILOSEC) 20 MG capsule Take 1 capsule (20 mg total) by mouth daily. FOR ACID REFLUX 90 capsule 1   sacubitril-valsartan (ENTRESTO) 24-26 MG Take 1 tablet by mouth 2 (two) times daily. 180 tablet 1   spironolactone (ALDACTONE) 25 MG tablet Take 0.5 tablets (12.5 mg total) by mouth daily. 15 tablet 1   No current facility-administered medications for this visit.    No Known Allergies    Social History   Socioeconomic History   Marital status: Widowed    Spouse  name: Not on file   Number of children: 1   Years of education: Not on file   Highest education level: Not on file  Occupational History   Occupation: [employer]  Tobacco Use   Smoking status: Never   Smokeless tobacco: Never   Tobacco comments:    Never smoke 10/08/22  Vaping Use   Vaping Use: Never used  Substance and Sexual Activity   Alcohol use: Never   Drug use: Never   Sexual activity: Not Currently  Other Topics Concern   Not on file  Social History Narrative   Not on file   Social Determinants of Health   Financial Resource Strain: Medium Risk (12/05/2022)   Overall Financial Resource Strain (CARDIA)    Difficulty of Paying Living Expenses: Somewhat hard  Food Insecurity: No Food Insecurity (12/05/2022)   Hunger Vital Sign    Worried About Running Out of Food in the Last Year: Never true    Ran Out of Food in the Last Year: Never true  Transportation Needs: Unmet Transportation Needs (01/04/2023)   PRAPARE - Administrator, Civil Service (Medical): Yes    Lack of Transportation (Non-Medical): Yes  Physical Activity: Not on file  Stress: Not on file  Social Connections: Not on file  Intimate Partner Violence: Not At Risk (12/04/2022)   Humiliation, Afraid, Rape, and Kick questionnaire    Fear of Current or Ex-Partner: No    Emotionally Abused: No    Physically Abused: No    Sexually Abused: No     No family history on file.  There were no vitals filed for this visit.  Wt Readings from Last 3 Encounters:  01/15/23 106.1 kg (234 lb)  01/04/23 106.1 kg (234 lb)  12/28/22 107.6 kg (237 lb 3.2 oz)    PHYSICAL EXAM: General:  *** appearing.  No respiratory difficulty HEENT: normal Neck: supple. JVD *** cm. Carotids 2+ bilat; no bruits. No lymphadenopathy or thyromegaly appreciated. Cor: PMI nondisplaced. Regular rate & rhythm. No rubs, gallops or murmurs. Lungs: clear Abdomen: soft, nontender, nondistended. No hepatosplenomegaly. No bruits or  masses. Good bowel sounds. Extremities: no cyanosis, clubbing, rash, edema  Neuro: alert & oriented x 3, cranial nerves grossly intact. moves all 4 extremities w/o difficulty. Affect pleasant.   ECG: ***  ASSESSMENT & PLAN: HFrEF Suspect possibly tachymediated. Discovered in setting of Afib with RVR in December 2023. Echo 12/23: EF 45-50%, RV okay, severe LAE. No prior echo for comparison. - Echo 03/24 in setting of Afib with RVR: EF 35-40% - TEE 4/24: EF diff to assess with patient in rapid a fib ~30%. RV mod depressed. LA sev dilated. Mild MR/TR, LAA no clot. Small PFO NYHA II, volume *** - Continue Furosemide 40 mg daily - Continue Metoprolol xl 200 mg daily - Continue Entresto 24/26  mg BID - Continue  spiro 12.5 mg daily - Continue Jardiance 10 mg daily -BMET/BNP today, BMET again in 1 week -Would repeat echo once he is maintaining SR. If EF does not improve with GDMT and SR, will need cath +/- cMRI.   Paroxysmal atrial fibrillation -Diagnosed 12/23 -Successful DCCV 11/2022 -Now off amiodarone. *** - s/p DCCV 4/30 >> NSR -Continue eliquis -Consider sleep study at next visit ***  DM II -Last A1c 6.6% -On metformin and jardiance -Per PCP  HLD Continue Atorvastatin  HTN -BP controlled -Continue current management  He was provided samples of eliquis, jardiance and entresto.  Medicaid pending. ***  F/u ***  Brynda Peon, AGACNP-BC 01/28/23  [facility] [address] Heart and Vascular [place] [address] [phone] (office) [phone] (fax)

## 2023-01-29 ENCOUNTER — Other Ambulatory Visit: Payer: Self-pay

## 2023-01-29 ENCOUNTER — Telehealth (HOSPITAL_COMMUNITY): Payer: Self-pay

## 2023-01-29 ENCOUNTER — Ambulatory Visit (HOSPITAL_COMMUNITY)
Admission: RE | Admit: 2023-01-29 | Discharge: 2023-01-29 | Disposition: A | Discharge status: Home / Self Care | Payer: 59 | Source: Ambulatory Visit | Attending: Internal Medicine | Admitting: Internal Medicine

## 2023-01-29 ENCOUNTER — Encounter (HOSPITAL_COMMUNITY): Payer: Self-pay

## 2023-01-29 ENCOUNTER — Other Ambulatory Visit (HOSPITAL_COMMUNITY): Payer: Self-pay

## 2023-01-29 VITALS — BP 108/76 | HR 81 | Wt 237.2 lb

## 2023-01-29 DIAGNOSIS — I5022 Chronic systolic (congestive) heart failure: Secondary | ICD-10-CM | POA: Diagnosis not present

## 2023-01-29 DIAGNOSIS — I10 Essential (primary) hypertension: Secondary | ICD-10-CM

## 2023-01-29 DIAGNOSIS — Z7984 Long term (current) use of oral hypoglycemic drugs: Secondary | ICD-10-CM | POA: Diagnosis not present

## 2023-01-29 DIAGNOSIS — I429 Cardiomyopathy, unspecified: Secondary | ICD-10-CM | POA: Diagnosis not present

## 2023-01-29 DIAGNOSIS — E119 Type 2 diabetes mellitus without complications: Secondary | ICD-10-CM | POA: Diagnosis not present

## 2023-01-29 DIAGNOSIS — I480 Paroxysmal atrial fibrillation: Secondary | ICD-10-CM

## 2023-01-29 DIAGNOSIS — E785 Hyperlipidemia, unspecified: Secondary | ICD-10-CM | POA: Insufficient documentation

## 2023-01-29 DIAGNOSIS — I5020 Unspecified systolic (congestive) heart failure: Secondary | ICD-10-CM

## 2023-01-29 DIAGNOSIS — Q2112 Patent foramen ovale: Secondary | ICD-10-CM | POA: Insufficient documentation

## 2023-01-29 DIAGNOSIS — Z139 Encounter for screening, unspecified: Secondary | ICD-10-CM

## 2023-01-29 DIAGNOSIS — Z7901 Long term (current) use of anticoagulants: Secondary | ICD-10-CM | POA: Diagnosis not present

## 2023-01-29 DIAGNOSIS — I110 Hypertensive heart disease with heart failure: Secondary | ICD-10-CM | POA: Insufficient documentation

## 2023-01-29 DIAGNOSIS — I4891 Unspecified atrial fibrillation: Secondary | ICD-10-CM | POA: Diagnosis not present

## 2023-01-29 DIAGNOSIS — Z79899 Other long term (current) drug therapy: Secondary | ICD-10-CM | POA: Diagnosis not present

## 2023-01-29 LAB — BRAIN NATRIURETIC PEPTIDE: B Natriuretic Peptide: 70.1 pg/mL (ref 0.0–100.0)

## 2023-01-29 LAB — CBC
HCT: 46.8 % (ref 39.0–52.0)
Hemoglobin: 14.2 g/dL (ref 13.0–17.0)
MCH: 22.6 pg — ABNORMAL LOW (ref 26.0–34.0)
MCHC: 30.3 g/dL (ref 30.0–36.0)
MCV: 74.5 fL — ABNORMAL LOW (ref 80.0–100.0)
Platelets: 346 10*3/uL (ref 150–400)
RBC: 6.28 MIL/uL — ABNORMAL HIGH (ref 4.22–5.81)
RDW: 21.7 % — ABNORMAL HIGH (ref 11.5–15.5)
WBC: 9.9 10*3/uL (ref 4.0–10.5)
nRBC: 0 % (ref 0.0–0.2)

## 2023-01-29 LAB — BASIC METABOLIC PANEL
Anion gap: 9 (ref 5–15)
BUN: 17 mg/dL (ref 8–23)
CO2: 23 mmol/L (ref 22–32)
Calcium: 9.4 mg/dL (ref 8.9–10.3)
Chloride: 104 mmol/L (ref 98–111)
Creatinine, Ser: 1.41 mg/dL — ABNORMAL HIGH (ref 0.61–1.24)
GFR, Estimated: 56 mL/min — ABNORMAL LOW (ref >60)
Glucose, Bld: 129 mg/dL — ABNORMAL HIGH (ref 70–99)
Potassium: 4.5 mmol/L (ref 3.5–5.1)
Sodium: 136 mmol/L (ref 135–145)

## 2023-01-29 MED ORDER — APIXABAN 5 MG PO TABS
5.0000 mg | ORAL_TABLET | Freq: Two times a day (BID) | ORAL | 1 refills | Status: DC
Start: 2023-01-29 — End: 2023-02-14
  Filled 2023-01-29: qty 60, 30d supply, fill #0

## 2023-01-29 MED ORDER — FUROSEMIDE 40 MG PO TABS
40.0000 mg | ORAL_TABLET | Freq: Every day | ORAL | 1 refills | Status: DC | PRN
Start: 2023-01-29 — End: 2023-02-26
  Filled 2023-01-29: qty 30, 30d supply, fill #0

## 2023-01-29 NOTE — Progress Notes (Signed)
Medication Samples have been provided to the patient.  Drug name: ELIQUIS       Strength: 5MG         Qty: 56  LOT: [id number]  Exp.Date: 05/2024  Dosing instructions: ONE TAB TWICE A DAY  The patient has been instructed regarding the correct time, dose, and frequency of taking this medication, including desired effects and most common side effects.   Theresia Bough 9:53 AM 01/29/2023   Medication Samples have been provided to the patient.  Drug name: JARDIANCE       Strength: 10 MG        Qty: 28  LOT: [id number]  Exp.Date: 09/2024  Dosing instructions: ONE TAB DAILY  The patient has been instructed regarding the correct time, dose, and frequency of taking this medication, including desired effects and most common side effects.   Theresia Bough 9:54 AM 01/29/2023

## 2023-01-29 NOTE — Addendum Note (Signed)
Encounter addended by: Alen Bleacher, NP on: 01/29/2023 3:59 PM  Actions taken: Clinical Note Signed

## 2023-01-29 NOTE — Patient Instructions (Signed)
CHANGE Lasix to 20 mg as needed for 3 lb weight gain overnight or 5 lb weight gain in one week RESTART Eliquis 5 MG ONE TAB TWICE A DAY  Labs today We will only contact you if something comes back abnormal or we need to make some changes. Otherwise no news is good news!  You have been referred to [facility] -they will be in contact with an appointment  Your physician recommends that you schedule a follow-up appointment in: 3- 4 weeks   Do the following things EVERYDAY: Weigh yourself in the morning before breakfast. Write it down and keep it in a log. Take your medicines as prescribed Eat low salt foods--Limit salt (sodium) to 2000 mg per day.  Stay as active as you can everyday Limit all fluids for the day to less than 2 liters  At the [facility], you and your health needs are our priority. As part of our continuing mission to provide you with exceptional heart care, we have created designated Provider Care Teams. These Care Teams include your primary Cardiologist (physician) and Advanced Practice Providers (APPs- Physician Assistants and Nurse Practitioners) who all work together to provide you with the care you need, when you need it.   You may see any of the following providers on your designated Care Team at your next follow up: Dr Arvilla Meres Dr Marca Ancona Dr. Marcos Eke, NP Robbie Lis, [place] [facility] [place], [place] Brynda Peon, NP Karle Plumber, PharmD   Please be sure to bring in all your medications bottles to every appointment.    Thank you for choosing  [facility]   If you have any questions or concerns before your next appointment please send [place] a message through [place] or call our office at [phone].    TO LEAVE A MESSAGE FOR THE NURSE SELECT OPTION 2, PLEASE LEAVE A MESSAGE INCLUDING: YOUR NAME DATE OF BIRTH CALL BACK NUMBER REASON FOR CALL**this  is important as we prioritize the call backs  YOU WILL RECEIVE A CALL BACK THE SAME DAY AS LONG AS YOU CALL BEFORE 4:00 PM

## 2023-01-29 NOTE — Telephone Encounter (Signed)
Advanced Heart Failure Patient Advocate Encounter  Prior authorization submitted for Jardiance CMM Key [id number]  Burnell Blanks, CPhT Rx Patient Advocate Phone: [phone]

## 2023-01-29 NOTE — Progress Notes (Signed)
ReDS Vest / Clip - 01/29/23 0901       ReDS Vest / Clip   Station Marker D    Ruler Value 33    ReDS Value Range Low volume    ReDS Actual Value 24    Anatomical Comments siting

## 2023-01-30 ENCOUNTER — Other Ambulatory Visit (HOSPITAL_COMMUNITY): Payer: Self-pay

## 2023-01-30 NOTE — Telephone Encounter (Signed)
Advanced Heart Failure Patient Advocate Encounter  Prior authorization for [place] Pepper has been approved Effective 01/30/2023 to 01/29/2024.  Test billing returns a copay of $143.20 for 30 days, and this patient is eligible to use a copay card (as the medicaid on file is Family Planning only)  Sherryll Burger returns $590.51 as deductible is unmet at this time, and is also eligible for a copay savings card.  Eliquis is ready for pick up at pharmacy and already has savings applied to bring the current cost to $10.  Spoke to patient by phone and advised patient and son to obtain savings cards from the company websites to bring to pharmacy. Patient expressed understanding and mentioned receiving information about the [place] program recently. Nothing further needed at this time.  Burnell Blanks, CPhT Rx Patient Advocate Phone: [phone]

## 2023-02-01 ENCOUNTER — Telehealth (HOSPITAL_COMMUNITY): Payer: Self-pay | Admitting: Licensed Clinical Social Worker

## 2023-02-01 NOTE — Telephone Encounter (Addendum)
H&V Care Navigation CSW Progress Note  Clinical Social Worker consulted to assist with social barriers to pt coming to ED.  CSW called pt to discuss.  Pt reports he is working on paying for more days at the motel he stays at with his 63yo son.  States his sister has told him she would help him and he just needs to call her to coordinate this- he will plan on doing this ASAP.  Pt reports his other concern is his 63yo autistic son.  He does not want to leave him at the hotel alone so is trying to figure out a plan for him before coming to the ED.  States his son can take care of himself and make his own food but his concern is that he is too trusting and he does not feel safe leaving him in their current living situation.  States his in laws live in this area and he is planning on calling them to see if the son can stay with them while he is in the hospital.  Also states he is working on getting son disability and so he feels like he has a lot of things to get done.  CSW reiterated that clinic staff would not have told him to come to ED unless they had serious medical concerns that could be life threatening- pt expressed understanding and is planning on coming to ED but just needs to get these things done.  No interventions that CSW can identify at this time- pt agreeable to CSW calling back later today to check in on progress and discuss if we can assist in anyway.  UPDATE: CSW called back and pt was still organizing care for his son.  Reports he does not need CSW intervention and that he will come to ED when he has arranged things.   SDOH Screenings   Food Insecurity: No Food Insecurity (12/05/2022)  Housing: Medium Risk (01/04/2023)  Transportation Needs: Unmet Transportation Needs (01/04/2023)  Utilities: Not At Risk (12/04/2022)  Alcohol Screen: Low Risk  (12/05/2022)  Depression (PHQ2-9): Medium Risk (12/28/2022)  Financial Resource Strain: Medium Risk (12/05/2022)  Tobacco Use: Low Risk   (01/29/2023)   Burna Sis, LCSW Clinical Social Worker Advanced [facility] Failure Clinic Desk#: [phone] Cell#: [phone]

## 2023-02-05 ENCOUNTER — Other Ambulatory Visit: Payer: Self-pay

## 2023-02-11 LAB — ECHO TEE: Est EF: 30

## 2023-02-13 NOTE — Progress Notes (Unsigned)
Electrophysiology Office Note:    Date:  02/14/2023   ID:  Jonathan, Gonzalez 1961/08/24, MRN [id number]  [facility] Cardiologist:  Donato Schultz, MD  [facility] Electrophysiologist:  Lanier Prude, MD   Referring MD: Alen Bleacher, NP   Chief Complaint: Atrial fibrillation  History of Present Illness:    Jonathan Gonzalez is a 62 y.o. male who I am seeing today for an evaluation of atrial fibrillation at the request of Dr. Gala Romney.  The patient has a medical history that includes chronic systolic heart failure, atrial fibrillation, hypertension, hyperlipidemia and diabetes.  His atrial fibrillation was diagnosed in December 2023.  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  He was admitted to the hospital in March 2024 with a further reduction in the ejection fraction down to 35 to 40%.  He was cardioverted during that admission.  His wife passed away recently.  At the 2022-12-24 appointment with Dr. Gala Romney in 12/24/2022 he was back in atrial fibrillation.  Amiodarone was started and a cardioversion was planned.  Cardioversion was performed April 30.  Today he tells me that he has been out of his amiodarone.  He is unclear about how long he has been out of it but at least for couple of weeks.  It seems that he has not gotten a refill picked up since he left the hospital.  I am also not sure whether or not he is taking his other medications.    Their past medical, social and family history was reveiwed.   ROS:   Please see the history of present illness.    All other systems reviewed and are negative.  EKGs/Labs/Other Studies Reviewed:    The following studies were reviewed today:  Feb 11, 2023 transesophageal echo EF approximately 30% RV function moderately reduced No left atrial appendage thrombus Mild MR   EKG:  The ekg ordered today demonstrates A-fib with RVR, ventricular rate 123 bpm   Physical Exam:    VS:  BP 124/70   Pulse  70   Ht 5\' 8"  (1.727 m)   Wt 238 lb (108 kg)   SpO2 97%   BMI 36.19 kg/m     Wt Readings from Last 3 Encounters:  02/14/23 238 lb (108 kg)  01/29/23 237 lb 3.2 oz (107.6 kg)  01/15/23 234 lb (106.1 kg)     GEN:  Well nourished, well developed in no acute distress CARDIAC: Irregularly irregular, tachycardic, no murmurs, rubs, gallops RESPIRATORY:  Clear to auscultation without rales, wheezing or rhonchi       ASSESSMENT AND PLAN:    1. Persistent atrial fibrillation (HCC)   2. HFrEF (heart failure with reduced ejection fraction) (HCC)   3. Primary hypertension   4. Type 2 diabetes mellitus with hyperglycemia, without long-term current use of insulin (HCC)   5. Atrial fibrillation with RVR (HCC)   6. Heart failure with mildly reduced ejection fraction (HFmrEF) (HCC)   7. Paroxysmal atrial fibrillation (HCC)     #Persistent atrial fibrillation Poorly controlled at this time.  Prescribed amiodarone but has not been taking.  Has failed prior cardioversion.  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  Unfortunately, his social situation is complicating the management of his arrhythmia and heart failure.   Given has been out of his amiodarone for an unclear amount of time I will send a fresh prescription.  I am going to have him take 200 g by mouth twice  daily for 2 weeks and then decrease the dose to 200 mg by mouth once daily.  I would like to have him come back to see one of the APP's in 3 months.  We discussed treatment options including catheter ablation and permanent pacemaker post AV junction ablation.  I would like to avoid pacemaker implant given his young age.  Unfortunately his medication noncompliance prevents [place] from proceeding with catheter ablation at this time.  Is also difficult to judge success or failure of amiodarone given he has not been taking the medication.  #Chronic systolic heart failure NYHA class II-III.  Warm and dry on exam today.   Follows with Dr. Gala Romney in the heart failure clinic.  Thought to be tachycardia mediated.  Rhythm control indicated as above.  Continue GDMT and follow-up with heart failure clinic.  #Hypertension At goal today.  Recommend checking blood pressures 1-2 times per week at home and recording the values.  Recommend bringing these recordings to the primary care physician.  Follow-up 3 months with APP.       Signed, Rossie Muskrat. Lalla Brothers, MD, [facility], [facility] 02/14/2023 10:02 AM    Electrophysiology [facility]

## 2023-02-14 ENCOUNTER — Encounter: Payer: Self-pay | Admitting: Cardiology

## 2023-02-14 ENCOUNTER — Ambulatory Visit: Payer: 59 | Attending: Cardiology | Admitting: Cardiology

## 2023-02-14 ENCOUNTER — Other Ambulatory Visit: Payer: Self-pay

## 2023-02-14 VITALS — BP 124/70 | HR 70 | Ht 68.0 in | Wt 238.0 lb

## 2023-02-14 DIAGNOSIS — I10 Essential (primary) hypertension: Secondary | ICD-10-CM

## 2023-02-14 DIAGNOSIS — E1165 Type 2 diabetes mellitus with hyperglycemia: Secondary | ICD-10-CM | POA: Diagnosis not present

## 2023-02-14 DIAGNOSIS — I4891 Unspecified atrial fibrillation: Secondary | ICD-10-CM

## 2023-02-14 DIAGNOSIS — I4819 Other persistent atrial fibrillation: Secondary | ICD-10-CM | POA: Diagnosis not present

## 2023-02-14 DIAGNOSIS — I480 Paroxysmal atrial fibrillation: Secondary | ICD-10-CM | POA: Diagnosis not present

## 2023-02-14 DIAGNOSIS — Z7985 Long-term (current) use of injectable non-insulin antidiabetic drugs: Secondary | ICD-10-CM

## 2023-02-14 DIAGNOSIS — I5020 Unspecified systolic (congestive) heart failure: Secondary | ICD-10-CM

## 2023-02-14 DIAGNOSIS — Z794 Long term (current) use of insulin: Secondary | ICD-10-CM | POA: Diagnosis not present

## 2023-02-14 DIAGNOSIS — I5022 Chronic systolic (congestive) heart failure: Secondary | ICD-10-CM | POA: Diagnosis not present

## 2023-02-14 MED ORDER — EMPAGLIFLOZIN 10 MG PO TABS
10.0000 mg | ORAL_TABLET | Freq: Every day | ORAL | 3 refills | Status: DC
Start: 2023-02-14 — End: 2024-04-30
  Filled 2023-02-14 – 2023-08-01 (×2): qty 30, 30d supply, fill #0
  Filled 2023-10-24: qty 30, 30d supply, fill #1
  Filled 2023-11-21: qty 30, 30d supply, fill #2
  Filled 2023-12-20: qty 30, 30d supply, fill #3
  Filled 2024-01-21: qty 30, 30d supply, fill #4

## 2023-02-14 MED ORDER — APIXABAN 5 MG PO TABS
5.0000 mg | ORAL_TABLET | Freq: Two times a day (BID) | ORAL | 1 refills | Status: DC
  Filled 2023-02-14: qty 60, 30d supply, fill #0

## 2023-02-14 MED ORDER — AMIODARONE HCL 200 MG PO TABS
ORAL_TABLET | ORAL | 3 refills | Status: DC
  Filled 2023-02-14: qty 44, 30d supply, fill #0

## 2023-02-14 MED ORDER — METOPROLOL SUCCINATE ER 200 MG PO TB24
200.0000 mg | ORAL_TABLET | Freq: Every day | ORAL | 3 refills | Status: DC
Start: 2023-02-14 — End: 2023-02-26
  Filled 2023-02-14: qty 30, 30d supply, fill #0

## 2023-02-14 MED ORDER — SACUBITRIL-VALSARTAN 24-26 MG PO TABS
1.0000 | ORAL_TABLET | Freq: Two times a day (BID) | ORAL | 1 refills | Status: DC
Start: 2023-02-14 — End: 2023-02-26
  Filled 2023-02-14: qty 60, 30d supply, fill #0

## 2023-02-14 MED ORDER — AMIODARONE HCL 200 MG PO TABS
200.0000 mg | ORAL_TABLET | Freq: Two times a day (BID) | ORAL | 3 refills | Status: DC
  Filled 2023-02-14: qty 60, 30d supply, fill #0

## 2023-02-14 NOTE — Patient Instructions (Addendum)
Medication Instructions:  1) RESTART taking amiodarone 200 mg twice daily for two weeks, then decrease to 200 mg once daily  *If you need a refill on your cardiac medications before your next appointment, please call your pharmacy*  Follow-Up: At [facility], you and your health needs are our priority.  As part of our continuing mission to provide you with exceptional heart care, we have created designated Provider Care Teams.  These Care Teams include your primary Cardiologist (physician) and Advanced Practice Providers (APPs -  Physician Assistants and Nurse Practitioners) who all work together to provide you with the care you need, when you need it.   Your next appointment:   3 months  Provider:   You will see one of the following Advanced Practice Providers on your designated Care Team:   Francis Dowse, [place]" [place], [place] Sherie Don, NP

## 2023-02-21 ENCOUNTER — Inpatient Hospital Stay (HOSPITAL_COMMUNITY)
Admission: AD | Admit: 2023-02-21 | Discharge: 2023-02-26 | DRG: 308 | Disposition: A | Discharge status: Home / Self Care | Payer: 59 | Source: Other Acute Inpatient Hospital | Attending: Internal Medicine | Admitting: Internal Medicine

## 2023-02-21 ENCOUNTER — Encounter (HOSPITAL_COMMUNITY): Payer: Self-pay

## 2023-02-21 ENCOUNTER — Ambulatory Visit (HOSPITAL_COMMUNITY)
Admission: RE | Admit: 2023-02-21 | Discharge: 2023-02-21 | Disposition: A | Discharge status: Home / Self Care | Payer: 59 | Source: Ambulatory Visit | Attending: Internal Medicine | Admitting: Internal Medicine

## 2023-02-21 ENCOUNTER — Encounter (HOSPITAL_COMMUNITY): Payer: Self-pay | Admitting: Internal Medicine

## 2023-02-21 ENCOUNTER — Other Ambulatory Visit: Payer: Self-pay

## 2023-02-21 VITALS — BP 100/60 | HR 82 | Wt 238.0 lb

## 2023-02-21 DIAGNOSIS — Z7901 Long term (current) use of anticoagulants: Secondary | ICD-10-CM

## 2023-02-21 DIAGNOSIS — Z560 Unemployment, unspecified: Secondary | ICD-10-CM | POA: Diagnosis not present

## 2023-02-21 DIAGNOSIS — I5023 Acute on chronic systolic (congestive) heart failure: Secondary | ICD-10-CM | POA: Diagnosis not present

## 2023-02-21 DIAGNOSIS — I110 Hypertensive heart disease with heart failure: Secondary | ICD-10-CM | POA: Insufficient documentation

## 2023-02-21 DIAGNOSIS — Z634 Disappearance and death of family member: Secondary | ICD-10-CM | POA: Diagnosis not present

## 2023-02-21 DIAGNOSIS — I480 Paroxysmal atrial fibrillation: Secondary | ICD-10-CM

## 2023-02-21 DIAGNOSIS — I4891 Unspecified atrial fibrillation: Secondary | ICD-10-CM | POA: Diagnosis not present

## 2023-02-21 DIAGNOSIS — Z7984 Long term (current) use of oral hypoglycemic drugs: Secondary | ICD-10-CM | POA: Diagnosis not present

## 2023-02-21 DIAGNOSIS — Z5982 Transportation insecurity: Secondary | ICD-10-CM | POA: Insufficient documentation

## 2023-02-21 DIAGNOSIS — R079 Chest pain, unspecified: Secondary | ICD-10-CM | POA: Insufficient documentation

## 2023-02-21 DIAGNOSIS — R0602 Shortness of breath: Secondary | ICD-10-CM | POA: Insufficient documentation

## 2023-02-21 DIAGNOSIS — I4819 Other persistent atrial fibrillation: Principal | ICD-10-CM | POA: Diagnosis present

## 2023-02-21 DIAGNOSIS — Z79899 Other long term (current) drug therapy: Secondary | ICD-10-CM

## 2023-02-21 DIAGNOSIS — Z5901 Sheltered homelessness: Secondary | ICD-10-CM | POA: Diagnosis not present

## 2023-02-21 DIAGNOSIS — E119 Type 2 diabetes mellitus without complications: Secondary | ICD-10-CM | POA: Insufficient documentation

## 2023-02-21 DIAGNOSIS — I5022 Chronic systolic (congestive) heart failure: Secondary | ICD-10-CM

## 2023-02-21 DIAGNOSIS — I429 Cardiomyopathy, unspecified: Secondary | ICD-10-CM | POA: Diagnosis present

## 2023-02-21 DIAGNOSIS — E1165 Type 2 diabetes mellitus with hyperglycemia: Secondary | ICD-10-CM

## 2023-02-21 DIAGNOSIS — Z5986 Financial insecurity: Secondary | ICD-10-CM | POA: Insufficient documentation

## 2023-02-21 DIAGNOSIS — I509 Heart failure, unspecified: Secondary | ICD-10-CM | POA: Diagnosis not present

## 2023-02-21 DIAGNOSIS — E785 Hyperlipidemia, unspecified: Secondary | ICD-10-CM | POA: Diagnosis not present

## 2023-02-21 DIAGNOSIS — E1122 Type 2 diabetes mellitus with diabetic chronic kidney disease: Secondary | ICD-10-CM | POA: Diagnosis present

## 2023-02-21 DIAGNOSIS — R062 Wheezing: Secondary | ICD-10-CM | POA: Insufficient documentation

## 2023-02-21 DIAGNOSIS — I130 Hypertensive heart and chronic kidney disease with heart failure and stage 1 through stage 4 chronic kidney disease, or unspecified chronic kidney disease: Secondary | ICD-10-CM | POA: Diagnosis present

## 2023-02-21 DIAGNOSIS — M19042 Primary osteoarthritis, left hand: Secondary | ICD-10-CM | POA: Diagnosis present

## 2023-02-21 DIAGNOSIS — Z7902 Long term (current) use of antithrombotics/antiplatelets: Secondary | ICD-10-CM | POA: Diagnosis not present

## 2023-02-21 DIAGNOSIS — N1831 Chronic kidney disease, stage 3a: Secondary | ICD-10-CM | POA: Diagnosis not present

## 2023-02-21 DIAGNOSIS — E7800 Pure hypercholesterolemia, unspecified: Secondary | ICD-10-CM

## 2023-02-21 LAB — CBC WITH DIFFERENTIAL/PLATELET
Abs Immature Granulocytes: 0 10*3/uL (ref 0.00–0.07)
Band Neutrophils: 1 %
Basophils Absolute: 0.3 10*3/uL — ABNORMAL HIGH (ref 0.0–0.1)
Basophils Relative: 3 %
Eosinophils Absolute: 0 10*3/uL (ref 0.0–0.5)
Eosinophils Relative: 0 %
HCT: 44.9 % (ref 39.0–52.0)
Hemoglobin: 13.4 g/dL (ref 13.0–17.0)
Lymphocytes Relative: 18 %
Lymphs Abs: 1.8 10*3/uL (ref 0.7–4.0)
MCH: 22.5 pg — ABNORMAL LOW (ref 26.0–34.0)
MCHC: 29.8 g/dL — ABNORMAL LOW (ref 30.0–36.0)
MCV: 75.5 fL — ABNORMAL LOW (ref 80.0–100.0)
Monocytes Absolute: 0.3 10*3/uL (ref 0.1–1.0)
Monocytes Relative: 3 %
Neutro Abs: 7.7 10*3/uL (ref 1.7–7.7)
Neutrophils Relative %: 75 %
Platelets: 341 10*3/uL (ref 150–400)
RBC: 5.95 MIL/uL — ABNORMAL HIGH (ref 4.22–5.81)
RDW: 21.9 % — ABNORMAL HIGH (ref 11.5–15.5)
WBC: 10.1 10*3/uL (ref 4.0–10.5)
nRBC: 0 % (ref 0.0–0.2)

## 2023-02-21 LAB — BASIC METABOLIC PANEL
Anion gap: 10 (ref 5–15)
BUN: 13 mg/dL (ref 8–23)
CO2: 22 mmol/L (ref 22–32)
Calcium: 9.2 mg/dL (ref 8.9–10.3)
Chloride: 108 mmol/L (ref 98–111)
Creatinine, Ser: 1.48 mg/dL — ABNORMAL HIGH (ref 0.61–1.24)
GFR, Estimated: 53 mL/min — ABNORMAL LOW (ref >60)
Glucose, Bld: 111 mg/dL — ABNORMAL HIGH (ref 70–99)
Potassium: 4.3 mmol/L (ref 3.5–5.1)
Sodium: 140 mmol/L (ref 135–145)

## 2023-02-21 LAB — COMPREHENSIVE METABOLIC PANEL
ALT: 19 U/L (ref 0–44)
AST: 21 U/L (ref 15–41)
Albumin: 3.6 g/dL (ref 3.5–5.0)
Alkaline Phosphatase: 48 U/L (ref 38–126)
Anion gap: 19 — ABNORMAL HIGH (ref 5–15)
BUN: 13 mg/dL (ref 8–23)
CO2: 19 mmol/L — ABNORMAL LOW (ref 22–32)
Calcium: 9.6 mg/dL (ref 8.9–10.3)
Chloride: 102 mmol/L (ref 98–111)
Creatinine, Ser: 1.35 mg/dL — ABNORMAL HIGH (ref 0.61–1.24)
GFR, Estimated: 59 mL/min — ABNORMAL LOW (ref >60)
Glucose, Bld: 120 mg/dL — ABNORMAL HIGH (ref 70–99)
Potassium: 4.3 mmol/L (ref 3.5–5.1)
Sodium: 140 mmol/L (ref 135–145)
Total Bilirubin: 0.5 mg/dL (ref 0.3–1.2)
Total Protein: 6.5 g/dL (ref 6.5–8.1)

## 2023-02-21 LAB — BRAIN NATRIURETIC PEPTIDE
B Natriuretic Peptide: 82.2 pg/mL (ref 0.0–100.0)
B Natriuretic Peptide: 75.9 pg/mL (ref 0.0–100.0)

## 2023-02-21 LAB — CBC
HCT: 47.7 % (ref 39.0–52.0)
Hemoglobin: 14.1 g/dL (ref 13.0–17.0)
MCH: 22.3 pg — ABNORMAL LOW (ref 26.0–34.0)
MCHC: 29.6 g/dL — ABNORMAL LOW (ref 30.0–36.0)
MCV: 75.6 fL — ABNORMAL LOW (ref 80.0–100.0)
Platelets: 388 10*3/uL (ref 150–400)
RBC: 6.31 MIL/uL — ABNORMAL HIGH (ref 4.22–5.81)
RDW: 22.2 % — ABNORMAL HIGH (ref 11.5–15.5)
WBC: 11 10*3/uL — ABNORMAL HIGH (ref 4.0–10.5)
nRBC: 0 % (ref 0.0–0.2)

## 2023-02-21 LAB — MRSA NEXT GEN BY PCR, NASAL: MRSA by PCR Next Gen: NOT DETECTED

## 2023-02-21 LAB — MAGNESIUM: Magnesium: 2.1 mg/dL (ref 1.7–2.4)

## 2023-02-21 LAB — TSH: TSH: 4.38 u[IU]/mL (ref 0.350–4.500)

## 2023-02-21 MED ORDER — APIXABAN 5 MG PO TABS
5.0000 mg | ORAL_TABLET | Freq: Two times a day (BID) | ORAL | Status: DC
  Administered 2023-02-21 – 2023-02-26 (×10): 5 mg via ORAL
  Filled 2023-02-21 (×10): qty 1

## 2023-02-21 MED ORDER — ACETAMINOPHEN 325 MG PO TABS: 650.0000 mg | ORAL_TABLET | ORAL | Status: DC | PRN

## 2023-02-21 MED ORDER — SODIUM CHLORIDE 0.9% FLUSH
3.0000 mL | Freq: Two times a day (BID) | INTRAVENOUS | Status: DC
  Administered 2023-02-21 – 2023-02-26 (×9): 3 mL via INTRAVENOUS

## 2023-02-21 MED ORDER — ATORVASTATIN CALCIUM 10 MG PO TABS
10.0000 mg | ORAL_TABLET | Freq: Every day | ORAL | Status: DC
  Administered 2023-02-21 – 2023-02-26 (×5): 10 mg via ORAL
  Filled 2023-02-21 (×5): qty 1

## 2023-02-21 MED ORDER — SODIUM CHLORIDE 0.9% FLUSH: 3.0000 mL | INTRAVENOUS | Status: DC | PRN

## 2023-02-21 MED ORDER — AMIODARONE HCL IN DEXTROSE 360-4.14 MG/200ML-% IV SOLN
60.0000 mg/h | INTRAVENOUS | Status: DC
  Administered 2023-02-21: 30 mg/h via INTRAVENOUS
  Administered 2023-02-22 – 2023-02-26 (×12): 60 mg/h via INTRAVENOUS
  Filled 2023-02-21 (×13): qty 200

## 2023-02-21 MED ORDER — AMIODARONE HCL IN DEXTROSE 360-4.14 MG/200ML-% IV SOLN
60.0000 mg/h | INTRAVENOUS | Status: DC
  Administered 2023-02-21: 60 mg/h via INTRAVENOUS
  Filled 2023-02-21 (×2): qty 200

## 2023-02-21 MED ORDER — PANTOPRAZOLE SODIUM 40 MG PO TBEC
40.0000 mg | DELAYED_RELEASE_TABLET | Freq: Every day | ORAL | Status: DC
  Administered 2023-02-21 – 2023-02-26 (×5): 40 mg via ORAL
  Filled 2023-02-21 (×5): qty 1

## 2023-02-21 MED ORDER — EMPAGLIFLOZIN 10 MG PO TABS
10.0000 mg | ORAL_TABLET | Freq: Every day | ORAL | Status: DC
  Administered 2023-02-22 – 2023-02-26 (×4): 10 mg via ORAL
  Filled 2023-02-21 (×4): qty 1

## 2023-02-21 MED ORDER — ONDANSETRON HCL 4 MG/2ML IJ SOLN
4.0000 mg | Freq: Four times a day (QID) | INTRAMUSCULAR | Status: DC | PRN
  Administered 2023-02-24: 4 mg via INTRAVENOUS
  Filled 2023-02-21: qty 2

## 2023-02-21 MED ORDER — SODIUM CHLORIDE 0.9 % IV SOLN: 250.0000 mL | INTRAVENOUS | Status: DC | PRN

## 2023-02-21 MED ORDER — SACUBITRIL-VALSARTAN 24-26 MG PO TABS
1.0000 | ORAL_TABLET | Freq: Two times a day (BID) | ORAL | Status: DC
  Administered 2023-02-21 – 2023-02-26 (×9): 1 via ORAL
  Filled 2023-02-21 (×9): qty 1

## 2023-02-21 MED ORDER — FUROSEMIDE 10 MG/ML IJ SOLN
40.0000 mg | Freq: Two times a day (BID) | INTRAMUSCULAR | Status: DC
  Administered 2023-02-21 – 2023-02-23 (×4): 40 mg via INTRAVENOUS
  Filled 2023-02-21 (×4): qty 4

## 2023-02-21 NOTE — H&P (Addendum)
ADVANCED HF TEAM H&P This note reflects work completed today.        Referring Physician: Dr. Antony Contras Primary Care: Bertram Denver, NP Primary Cardiologist: Dr. Anne Fu   HPI: 62 y.o. male with history of HFrEF, atrial fibrillation (diagnosed 12/23), HTN, HLD, DM and systolic HF   Patient admitted 09/14/22 with new afib with RVR. Treated with rate control. 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 Echo demonstrated EF 45-50%, RV okay, severe LAE. Plan was for cardioversion at a later date but cancelled the procedure d/t passing of his wife. He later missed doses of eliquis and ran out of metoprolol and farxiga. He was admitted with Afib with RVR and a/c CHF in March 2024. EF down slightly further to 35-40%. He was diuresed and GDMT titrated. He underwent successful DCCV during the admission.   We saw him in [facility] on 01/04/23 and was back in AF. Started on amio and underwent outpatient TEE/DCVV on 01/15/23.   TEE EF 30% severe LAE. Mild MR     His wife passed away recently. Currently lives at a motel with his autistic son. Lost his job at [employer] this past week.    I saw him in HF Clinic today. Back in AF with RVR. Ran out of all meds except eliquis, jardiance, metformin and Entresto. Says he has been out for a while. The meds were called in but he could't afford $757. Says he feels ok. Breathing not too bad. Can do ADLs without problem. Occasional CP. No edema. Taking a lot of BC Powder for L hand arthritis   I am admitting him for AF with RVR and ADHF.      Review of Systems: [y] = yes, [ ]  = no    General: Weight gain [ ] ; Weight loss [ ] ; Anorexia [ ] ; Fatigue [ ] ; Fever [ ] ; Chills [ ] ; Weakness [ ]   Cardiac: Chest pain/pressure [ ] ; Resting SOB [ ] ; Exertional SOB [ y]; Orthopnea [ ] ; Pedal Edema [ ] ; Palpitations [ ] ; Syncope [ ] ; Presyncope [ ] ; Paroxysmal nocturnal dyspnea[ ]   Pulmonary: Cough [ ] ; Wheezing[ y]; Hemoptysis[ ] ; Sputum [  ]; Snoring [ ]   GI: Vomiting[ ] ; Dysphagia[ ] ; Melena[ ] ; Hematochezia [ ] ; Heartburn[ ] ; Abdominal pain [ ] ; Constipation [ ] ; Diarrhea [ ] ; BRBPR [ ]   GU: Hematuria[ ] ; Dysuria [ ] ; Nocturia[ ]   Vascular: Pain in legs with walking [ ] ; Pain in feet with lying flat [ ] ; Non-healing sores [ ] ; Stroke [ ] ; TIA [ ] ; Slurred speech [ ] ;  Neuro: Headaches[ ] ; Vertigo[ ] ; Seizures[ ] ; Paresthesias[ ] ;Blurred vision [ ] ; Diplopia [ ] ; Vision changes [ ]   Ortho/Skin: Arthritis [ y]; Joint pain [place]ove[place]Etienne ]; Muscle pain [ ] ; Joint swelling [ ] ; Back Pain [ ] ; Rash [ ]   Psych: Depression[ ] ; Anxiety[ ]   Heme: Bleeding problems [ ] ; Clotting disorders [ ] ; Anemia [ ]   Endocrine: Diabetes [Y ]; Thyroid dysfunction[ ]          Past Medical History:  Diagnosis Date   Diabetes mellitus without complication     Hyperlipidemia     Hypertension              Current Outpatient Medications  Medication Sig Dispense Refill   amiodarone (PACERONE) 200 MG tablet Take 1 tablet (200 mg total) by mouth 2 (two) times daily for 12 days, THEN 1 tablet (200 mg total) daily for 18 days.  42 tablet 0   atorvastatin (LIPITOR) 10 MG tablet Take 1 tablet (10 mg total) by mouth daily. 90 tablet 1   Blood Glucose Monitoring Suppl (TRUE METRIX METER) w/Device KIT Use as instructed. Check blood glucose level by fingerstick twice per day. E11.65 1 kit 0   empagliflozin (JARDIANCE) 10 MG TABS tablet Take 1 tablet (10 mg total) by mouth daily. 90 tablet 1   furosemide (LASIX) 40 MG tablet Take 1 tablet (40 mg total) by mouth daily. 90 tablet 1   glucose blood (TRUE METRIX BLOOD GLUCOSE TEST) test strip Use as instructed. Check blood glucose level by fingerstick twice per day. NEEDS PASS 100 each 12   hydrOXYzine (VISTARIL) 25 MG capsule Take 1-2 capsules (25-50 mg total) by mouth at bedtime. For sleep 60 capsule 3   Iron Polysacch Cmplx-B12-FA (POLY-IRON 150 FORTE) 150-0.025-1 MG CAPS Take by mouth every Monday, Wednesday, and Friday. 12  capsule 0   metFORMIN (GLUCOPHAGE) 500 MG tablet Take 1 tablet (500 mg total) by mouth 2 (two) times daily with a meal. 180 tablet 1   metoprolol (TOPROL XL) 200 MG 24 hr tablet Take 1 tablet (200 mg total) by mouth daily. 90 tablet 1   omeprazole (PRILOSEC) 20 MG capsule Take 1 capsule (20 mg total) by mouth daily. FOR ACID REFLUX 90 capsule 1   sacubitril-valsartan (ENTRESTO) 24-26 MG Take 1 tablet by mouth 2 (two) times daily. 180 tablet 1   spironolactone (ALDACTONE) 25 MG tablet Take 0.5 tablets (12.5 mg total) by mouth daily. 15 tablet 1   apixaban (ELIQUIS) 5 MG TABS tablet Take 1 tablet (5 mg total) by mouth 2 (two) times daily. 180 tablet 1    No current facility-administered medications for this encounter.      No Known Allergies     Social History         Socioeconomic History   Marital status: Widowed      Spouse name: Not on file   Number of children: 1   Years of education: Not on file   Highest education level: Not on file  Occupational History   Occupation: [employer]  Tobacco Use   Smoking status: Never   Smokeless tobacco: Never   Tobacco comments:      Never smoke 10/08/22  Vaping Use   Vaping Use: Never used  Substance and Sexual Activity   Alcohol use: Never   Drug use: Never   Sexual activity: Not Currently  Other Topics Concern   Not on file  Social History Narrative   Not on file    Social Determinants of Health        Financial Resource Strain: Medium Risk (12/05/2022)    Overall Financial Resource Strain (CARDIA)     Difficulty of Paying Living Expenses: Somewhat hard  Food Insecurity: No Food Insecurity (12/05/2022)    Hunger Vital Sign     Worried About Running Out of Food in the Last Year: Never true     Ran Out of Food in the Last Year: Never true  Transportation Needs: Unmet Transportation Needs (01/04/2023)    PRAPARE - Therapist, art (Medical): Yes     Lack of Transportation (Non-Medical): Yes  Physical  Activity: Not on file  Stress: Not on file  Social Connections: Not on file  Intimate Partner Violence: Not At Risk (12/04/2022)    Humiliation, Afraid, Rape, and Kick questionnaire     Fear of Current or Ex-Partner:  No     Emotionally Abused: No     Physically Abused: No     Sexually Abused: No       No family history on file.      Vitals:    01/04/23 1126  BP: 100/62  Pulse: 87  SpO2: 96%  Weight: 106.1 kg (234 lb)      PHYSICAL EXAM: General:  Sitting in chair Mild audible wheeze HEENT: normal Neck: supple. JVP 8-9. Carotids 2+ bilat; no bruits. No lymphadenopathy or thryomegaly appreciated. Cor: PMI nondisplaced. Irregular tachy No rubs, gallops or murmurs. Lungs: mild exp wheeze Abdomen: soft, nontender, nondistended. No hepatosplenomegaly. No bruits or masses. Good bowel sounds. Extremities: no cyanosis, clubbing, rash, edema Neuro: alert & orientedx3, cranial nerves grossly intact. moves all 4 extremities w/o difficulty. Affect pleasant     ECG: Atrial fibrillation/tach rate 161 bpm     ASSESSMENT & PLAN:   1. Acute on chronic systolic HF -Suspect possibly tachymediated. Discovered in setting of Afib with RVR in December 2023. Echo 12/23: EF 45-50%, RV okay, severe LAE. No prior echo for comparison. -Echo 03/24 in setting of Afib with RVR: EF 35-40% -TEE 4/24 EF 30% - Back in Afib today. Volume status not too bad but does have mild exp wheeze. Give 40 mg IV lasix twice a day. - He is out of his lasix, Toprol, Jardiance - Remains on Entresto 24/26 bid  - Would admit today for amio load and DC-CV as well as treatment of HF - Will need PharmD assistance with meds.    2. Paroxysmal atrial fibrillation with RVR -Diagnosed 12/23 -Successful DCCV in March 2024 - Repeat DC-CV 4/24  - Back in Afib/atrial tach today with RVR.  - Will need admit for IV amio (was on amio po 200 bid - started 4/24 - but no out) - Continue Eliquis  - Plan DC-CV   3. DM II -Last A1c  6.6% -Hold metformin. Out of jardiance -Per PCP - Cover with SSI   4. HLD Continue Atorvastatin  Arvilla Meres, MD  3:57 PM

## 2023-02-21 NOTE — Progress Notes (Signed)
ADVANCED HF TEAM H&P    Referring Physician: Dr. Antony Contras Primary Care: Bertram Denver, NP Primary Cardiologist: Dr. Anne Fu  HPI: 62 y.o. male with history of HFrEF, atrial fibrillation (diagnosed 12/23), HTN, HLD, DM and systolic HF  Patient admitted 16/10/96 with new afib with RVR. Treated with rate control. 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 Echo demonstrated EF 45-50%, RV okay, severe LAE. Plan was for cardioversion at a later date but cancelled the procedure d/t passing of his wife. He later missed doses of eliquis and ran out of metoprolol and farxiga. He was admitted with Afib with RVR and a/c CHF in March 2024. EF down slightly further to 35-40%. He was diuresed and GDMT titrated. He underwent successful DCCV during the admission.  We saw him in [facility] on 01/04/23 and was back in AF. Started on amio and underwent outpatient TEE/DCVV on 01/15/23.   TEE EF 30% severe LAE. Mild MR   His wife passed away recently. Currently lives at a motel with his autistic son. Lost his job at [employer] this past week.   He is here today for follow-up. Back in AF with RVR. Ran out of all meds except eliquis, jardiance, metformin and Entresto. Says he has been out for a while. The meds were called in but he could't afford $757. Says he feels ok. Breathing not too bad. Can do ADLs without problem. Occasional CP. No edema. Taking a lot of BC Powder for L hand arthritis    Review of Systems: [y] = yes, [ ]  = no   General: Weight gain [ ] ; Weight loss [ ] ; Anorexia [ ] ; Fatigue [ ] ; Fever [ ] ; Chills [ ] ; Weakness [ ]   Cardiac: Chest pain/pressure [ ] ; Resting SOB [ ] ; Exertional SOB [ y]; Orthopnea [ ] ; Pedal Edema [ ] ; Palpitations [ ] ; Syncope [ ] ; Presyncope [ ] ; Paroxysmal nocturnal dyspnea[ ]   Pulmonary: Cough [ ] ; Wheezing[ y]; Hemoptysis[ ] ; Sputum [ ] ; Snoring [ ]   GI: Vomiting[ ] ; Dysphagia[ ] ; Melena[ ] ; Hematochezia [ ] ; Heartburn[ ] ; Abdominal  pain [ ] ; Constipation [ ] ; Diarrhea [ ] ; BRBPR [ ]   GU: Hematuria[ ] ; Dysuria [ ] ; Nocturia[ ]   Vascular: Pain in legs with walking [ ] ; Pain in feet with lying flat [ ] ; Non-healing sores [ ] ; Stroke [ ] ; TIA [ ] ; Slurred speech [ ] ;  Neuro: Headaches[ ] ; Vertigo[ ] ; Seizures[ ] ; Paresthesias[ ] ;Blurred vision [ ] ; Diplopia [ ] ; Vision changes [ ]   Ortho/Skin: Arthritis [ y]; Joint pain Cove.Etienne ]; Muscle pain [ ] ; Joint swelling [ ] ; Back Pain [ ] ; Rash [ ]   Psych: Depression[ ] ; Anxiety[ ]   Heme: Bleeding problems [ ] ; Clotting disorders [ ] ; Anemia [ ]   Endocrine: Diabetes [Y ]; Thyroid dysfunction[ ]    Past Medical History:  Diagnosis Date   Diabetes mellitus without complication    Hyperlipidemia    Hypertension     Current Outpatient Medications  Medication Sig Dispense Refill   amiodarone (PACERONE) 200 MG tablet Take 1 tablet (200 mg total) by mouth 2 (two) times daily for 12 days, THEN 1 tablet (200 mg total) daily for 18 days. 42 tablet 0   atorvastatin (LIPITOR) 10 MG tablet Take 1 tablet (10 mg total) by mouth daily. 90 tablet 1   Blood Glucose Monitoring Suppl (TRUE METRIX METER) w/Device KIT Use as instructed. Check blood glucose level by fingerstick twice per day. E11.65 1 kit  0   empagliflozin (JARDIANCE) 10 MG TABS tablet Take 1 tablet (10 mg total) by mouth daily. 90 tablet 1   furosemide (LASIX) 40 MG tablet Take 1 tablet (40 mg total) by mouth daily. 90 tablet 1   glucose blood (TRUE METRIX BLOOD GLUCOSE TEST) test strip Use as instructed. Check blood glucose level by fingerstick twice per day. NEEDS PASS 100 each 12   hydrOXYzine (VISTARIL) 25 MG capsule Take 1-2 capsules (25-50 mg total) by mouth at bedtime. For sleep 60 capsule 3   Iron Polysacch Cmplx-B12-FA (POLY-IRON 150 FORTE) 150-0.025-1 MG CAPS Take by mouth every Monday, Wednesday, and Friday. 12 capsule 0   metFORMIN (GLUCOPHAGE) 500 MG tablet Take 1 tablet (500 mg total) by mouth 2 (two) times daily with a meal.  180 tablet 1   metoprolol (TOPROL XL) 200 MG 24 hr tablet Take 1 tablet (200 mg total) by mouth daily. 90 tablet 1   omeprazole (PRILOSEC) 20 MG capsule Take 1 capsule (20 mg total) by mouth daily. FOR ACID REFLUX 90 capsule 1   sacubitril-valsartan (ENTRESTO) 24-26 MG Take 1 tablet by mouth 2 (two) times daily. 180 tablet 1   spironolactone (ALDACTONE) 25 MG tablet Take 0.5 tablets (12.5 mg total) by mouth daily. 15 tablet 1   apixaban (ELIQUIS) 5 MG TABS tablet Take 1 tablet (5 mg total) by mouth 2 (two) times daily. 180 tablet 1   No current facility-administered medications for this encounter.    No Known Allergies    Social History   Socioeconomic History   Marital status: Widowed    Spouse name: Not on file   Number of children: 1   Years of education: Not on file   Highest education level: Not on file  Occupational History   Occupation: [employer]  Tobacco Use   Smoking status: Never   Smokeless tobacco: Never   Tobacco comments:    Never smoke 10/08/22  Vaping Use   Vaping Use: Never used  Substance and Sexual Activity   Alcohol use: Never   Drug use: Never   Sexual activity: Not Currently  Other Topics Concern   Not on file  Social History Narrative   Not on file   Social Determinants of Health   Financial Resource Strain: Medium Risk (12/05/2022)   Overall Financial Resource Strain (CARDIA)    Difficulty of Paying Living Expenses: Somewhat hard  Food Insecurity: No Food Insecurity (12/05/2022)   Hunger Vital Sign    Worried About Running Out of Food in the Last Year: Never true    Ran Out of Food in the Last Year: Never true  Transportation Needs: Unmet Transportation Needs (01/04/2023)   PRAPARE - Administrator, Civil Service (Medical): Yes    Lack of Transportation (Non-Medical): Yes  Physical Activity: Not on file  Stress: Not on file  Social Connections: Not on file  Intimate Partner Violence: Not At Risk (12/04/2022)   Humiliation,  Afraid, Rape, and Kick questionnaire    Fear of Current or Ex-Partner: No    Emotionally Abused: No    Physically Abused: No    Sexually Abused: No     No family history on file.  Vitals:   01/04/23 1126  BP: 100/62  Pulse: 87  SpO2: 96%  Weight: 106.1 kg (234 lb)    PHYSICAL EXAM: General:  Sitting in chair Mild audible wheeze HEENT: normal Neck: supple. JVP 8-9. Carotids 2+ bilat; no bruits. No lymphadenopathy or thryomegaly appreciated.  Cor: PMI nondisplaced. Irregular tachy No rubs, gallops or murmurs. Lungs: mild exp wheeze Abdomen: soft, nontender, nondistended. No hepatosplenomegaly. No bruits or masses. Good bowel sounds. Extremities: no cyanosis, clubbing, rash, edema Neuro: alert & orientedx3, cranial nerves grossly intact. moves all 4 extremities w/o difficulty. Affect pleasant   ECG: Atrial fibrillation/tach rate 161 bpm   ASSESSMENT & PLAN:  1. Acute on chronic systolic HF -Suspect possibly tachymediated. Discovered in setting of Afib with RVR in December 2023. Echo 12/23: EF 45-50%, RV okay, severe LAE. No prior echo for comparison. -Echo 03/24 in setting of Afib with RVR: EF 35-40% -TEE 4/24 EF 30% - Back in Afib today. Volume status not too bad but does have mild exp wheeze. - He is out of his lasix, Toprol, Jardiance - Remains on Entresto 24/26 bid  - Would admit today for amio load and DC-CV as well as treatment of HF - Will need PharmD assistance with meds.   2. Paroxysmal atrial fibrillation with RVR -Diagnosed 12/23 -Successful DCCV in March 2024 - Repeat DC-CV 4/24  - Back in Afib/atrial tach today with RVR.  - Will need admit for IV amio (was on amio po 200 bid - started 4/24 - but no out) - Continue Eliquis  - Plan DC-CV  3. DM II -Last A1c 6.6% -On metformin. Out of jardiance -Per PCP - Cover with SSI  4. HLD Continue Atorvastatin   Arvilla Meres, MD  2:11 PM

## 2023-02-22 ENCOUNTER — Other Ambulatory Visit (HOSPITAL_COMMUNITY): Payer: Self-pay

## 2023-02-22 DIAGNOSIS — I4891 Unspecified atrial fibrillation: Secondary | ICD-10-CM | POA: Diagnosis not present

## 2023-02-22 LAB — BASIC METABOLIC PANEL
Anion gap: 10 (ref 5–15)
BUN: 19 mg/dL (ref 8–23)
CO2: 21 mmol/L — ABNORMAL LOW (ref 22–32)
Calcium: 8.8 mg/dL — ABNORMAL LOW (ref 8.9–10.3)
Chloride: 105 mmol/L (ref 98–111)
Creatinine, Ser: 1.36 mg/dL — ABNORMAL HIGH (ref 0.61–1.24)
GFR, Estimated: 58 mL/min — ABNORMAL LOW (ref >60)
Glucose, Bld: 113 mg/dL — ABNORMAL HIGH (ref 70–99)
Potassium: 3.9 mmol/L (ref 3.5–5.1)
Sodium: 136 mmol/L (ref 135–145)

## 2023-02-22 LAB — GLUCOSE, CAPILLARY
Glucose-Capillary: 134 mg/dL — ABNORMAL HIGH (ref 70–99)
Glucose-Capillary: 94 mg/dL (ref 70–99)
Glucose-Capillary: 101 mg/dL — ABNORMAL HIGH (ref 70–99)

## 2023-02-22 MED ORDER — GUAIFENESIN ER 600 MG PO TB12
600.0000 mg | ORAL_TABLET | Freq: Two times a day (BID) | ORAL | Status: DC
  Administered 2023-02-22 – 2023-02-26 (×8): 600 mg via ORAL
  Filled 2023-02-22 (×8): qty 1

## 2023-02-22 MED ORDER — INSULIN ASPART 100 UNIT/ML IJ SOLN
0.0000 [IU] | Freq: Three times a day (TID) | INTRAMUSCULAR | Status: DC
  Administered 2023-02-22: 2 [IU] via SUBCUTANEOUS
  Administered 2023-02-24: 3 [IU] via SUBCUTANEOUS

## 2023-02-22 MED ORDER — SPIRONOLACTONE 12.5 MG HALF TABLET
12.5000 mg | ORAL_TABLET | Freq: Every day | ORAL | Status: DC
  Administered 2023-02-22 – 2023-02-26 (×4): 12.5 mg via ORAL
  Filled 2023-02-22 (×4): qty 1

## 2023-02-22 MED ORDER — DM-GUAIFENESIN ER 30-600 MG PO TB12: 1.0000 | ORAL_TABLET | Freq: Two times a day (BID) | ORAL | Status: DC

## 2023-02-22 MED ORDER — GUAIFENESIN-DM 100-10 MG/5ML PO SYRP
15.0000 mL | ORAL_SOLUTION | ORAL | Status: DC | PRN
  Administered 2023-02-22 – 2023-02-26 (×6): 15 mL via ORAL
  Filled 2023-02-22 (×6): qty 15

## 2023-02-22 MED ORDER — INSULIN ASPART 100 UNIT/ML IJ SOLN: 0.0000 [IU] | Freq: Every day | INTRAMUSCULAR | Status: DC

## 2023-02-22 NOTE — Progress Notes (Signed)
   Scheduled Cardioversion for Monday at 900.   Continue amio drip.   Moua Rasmusson Np-C  1:57 PM

## 2023-02-22 NOTE — Progress Notes (Addendum)
Advanced Heart Failure Rounding Note  PCP-Cardiologist: Donato Schultz, MD   Subjective:    Admitted with a fib RVR. Now on amiodarone gtt. Remains in a fib, rates low 100s. Plan DCCV.   Feels good this morning, son at bedside. Denies CP/SOB.   Objective:   Weight Range: 106.4 kg Body mass index is 35.67 kg/m.   Vital Signs:   Temp:  [97.6 F (36.4 C)-98.2 F (36.8 C)] 98.2 F (36.8 C) (06/07 0716) Pulse Rate:  [74-134] 90 (06/07 0716) Resp:  [12-20] 18 (06/07 0716) BP: (101-128)/(66-86) 128/85 (06/07 0716) SpO2:  [92 %-98 %] 92 % (06/07 0400) Weight:  [106.4 kg-108 kg] 106.4 kg (06/07 0618) Last BM Date : 02/21/23  Weight change: Filed Weights   02/21/23 1508 02/22/23 0618  Weight: 108 kg 106.4 kg    Intake/Output:   Intake/Output Summary (Last 24 hours) at 02/22/2023 0757 Last data filed at 02/22/2023 0701 Gross per 24 hour  Intake 557.67 ml  Output 900 ml  Net -342.33 ml      Physical Exam  General:  elderly appearing.  No respiratory difficulty HEENT: normal Neck: supple. JVD difficult to see, does not appear elevated. Carotids 2+ bilat; no bruits. No lymphadenopathy or thyromegaly appreciated. Cor: PMI nondisplaced. Irregular rate & rhythm. No rubs, gallops or murmurs. Lungs: coarse throughout Abdomen: soft, nontender, nondistended. No hepatosplenomegaly. No bruits or masses. Good bowel sounds. Extremities: no cyanosis, clubbing, rash, edema  Neuro: alert & oriented x 3, cranial nerves grossly intact. moves all 4 extremities w/o difficulty. Affect pleasant.   Telemetry   A fib 100s (Personally reviewed)    EKG    A fib RVR 161 bpm  Labs    CBC Recent Labs    02/21/23 1430 02/21/23 1546  WBC 11.0* 10.1  NEUTROABS  --  7.7  HGB 14.1 13.4  HCT 47.7 44.9  MCV 75.6* 75.5*  PLT 388 341   Basic Metabolic Panel Recent Labs    09/81/19 1546 02/22/23 0037  NA 140 136  K 4.3 3.9  CL 102 105  CO2 19* 21*  GLUCOSE 120* 113*  BUN 13 19   CREATININE 1.35* 1.36*  CALCIUM 9.6 8.8*  MG 2.1  --    Liver Function Tests Recent Labs    02/21/23 1546  AST 21  ALT 19  ALKPHOS 48  BILITOT 0.5  PROT 6.5  ALBUMIN 3.6   No results for input(s): "LIPASE", "AMYLASE" in the last 72 hours. Cardiac Enzymes No results for input(s): "CKTOTAL", "CKMB", "CKMBINDEX", "TROPONINI" in the last 72 hours.  BNP: BNP (last 3 results) Recent Labs    01/29/23 1003 02/21/23 1430 02/21/23 1546  BNP 70.1 82.2 75.9    ProBNP (last 3 results) No results for input(s): "PROBNP" in the last 8760 hours.   D-Dimer No results for input(s): "DDIMER" in the last 72 hours. Hemoglobin A1C No results for input(s): "HGBA1C" in the last 72 hours. Fasting Lipid Panel No results for input(s): "CHOL", "HDL", "LDLCALC", "TRIG", "CHOLHDL", "LDLDIRECT" in the last 72 hours. Thyroid Function Tests Recent Labs    02/21/23 1546  TSH 4.380    Other results:   Imaging    No results found.   Medications:     Scheduled Medications:  apixaban  5 mg Oral BID   atorvastatin  10 mg Oral Daily   empagliflozin  10 mg Oral Daily   furosemide  40 mg Intravenous BID   pantoprazole  40 mg Oral Daily  sacubitril-valsartan  1 tablet Oral BID   sodium chloride flush  3 mL Intravenous Q12H    Infusions:  sodium chloride     amiodarone 30 mg/hr (02/22/23 0701)    PRN Medications: sodium chloride, acetaminophen, ondansetron (ZOFRAN) IV, sodium chloride flush    Patient Profile   62 y.o. male with history of HFrEF, atrial fibrillation (diagnosed 12/23), HTN, HLD, DM and systolic HF. Direct admitted yesterday from AHF clinic with a fib RVR.   Assessment/Plan  1. Paroxysmal atrial fibrillation with RVR -Diagnosed 12/23 -Successful DCCV in 3/24 - Repeat DC-CV 4/24  - Back in Afib/atrial tach with RVR 6/6 in AHF clinic - Continue amiodarone gtt, remains in a fib 100s - Continue Eliquis  - Plan DC-CV  2. Acute on chronic systolic  HF -Suspect possibly tachymediated. Discovered in setting of Afib with RVR in December 2023. Echo 12/23: EF 45-50%, RV okay, severe LAE. No prior echo for comparison. -Echo 03/24 in setting of Afib with RVR: EF 35-40% -TEE 4/24 EF 30% - Volume status improving. S/p 40 IV lasix BID. Weight down 4lbs. Still with coarse breath sounds. Will continue IV diuresis through today.  - Continue jardiance 10 mg daily - start spiro 12.5 mg daily - restart toprol XL - Continue Entresto 24/26 bid  - strict I&O, daily weights   3. DM II -Last A1c 6.6% -SSI   4. HLD Continue Atorvastatin  Length of Stay: 1  Alen Bleacher, NP  02/22/2023, 7:57 AM  Advanced Heart Failure Team Pager [phone] (M-F; 7a - 5p)  Please contact [facility] for night-coverage after hours (5p -7a ) and weekends on [email or web address]  Patient seen with NP, agree with the above note.   HR up to 130s-140s currently in AF but amiodarone bag has run out.  Breathing better and weight down.    General: NAD Neck: JVP difficult but appears elevated, no thyromegaly or thyroid nodule.  Lungs: Coarse BS CV: Nondisplaced PMI.  Heart tachy, irregular S1/S2, no S3/S4, no murmur.  No peripheral edema.   Abdomen: Soft, nontender, no hepatosplenomegaly, no distention.  Skin: Intact without lesions or rashes.  Neurologic: Alert and oriented x 3.  Psych: Normal affect. Extremities: No clubbing or cyanosis.  HEENT: Normal.   Patient is in AF with RVR but amiodarone has run out.  - Restart amiodarone gtt at 60 mg/hr for now.  - Continue Eliquis, he has not missed doses per his report since last TEE.  - He will need cardioversion, had breakfast today and unable to do today.  Have arranged for Monday.   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  I think he is still volume overloaded.  Continue Lasix 40 mg IV bid and will start spironolactone 12.5 daily.    Marca Ancona 02/22/2023 1:58 PM

## 2023-02-22 NOTE — Plan of Care (Signed)

## 2023-02-23 DIAGNOSIS — I4891 Unspecified atrial fibrillation: Secondary | ICD-10-CM | POA: Diagnosis not present

## 2023-02-23 LAB — BASIC METABOLIC PANEL
Anion gap: 11 (ref 5–15)
BUN: 23 mg/dL (ref 8–23)
CO2: 22 mmol/L (ref 22–32)
Calcium: 8.9 mg/dL (ref 8.9–10.3)
Chloride: 100 mmol/L (ref 98–111)
Creatinine, Ser: 1.51 mg/dL — ABNORMAL HIGH (ref 0.61–1.24)
GFR, Estimated: 52 mL/min — ABNORMAL LOW (ref >60)
Glucose, Bld: 110 mg/dL — ABNORMAL HIGH (ref 70–99)
Potassium: 3.8 mmol/L (ref 3.5–5.1)
Sodium: 133 mmol/L — ABNORMAL LOW (ref 135–145)

## 2023-02-23 LAB — GLUCOSE, CAPILLARY
Glucose-Capillary: 96 mg/dL (ref 70–99)
Glucose-Capillary: 82 mg/dL (ref 70–99)
Glucose-Capillary: 98 mg/dL (ref 70–99)
Glucose-Capillary: 159 mg/dL — ABNORMAL HIGH (ref 70–99)

## 2023-02-23 MED ORDER — POTASSIUM CHLORIDE CRYS ER 20 MEQ PO TBCR
40.0000 meq | EXTENDED_RELEASE_TABLET | Freq: Once | ORAL | Status: AC
  Administered 2023-02-23: 40 meq via ORAL
  Filled 2023-02-23: qty 2

## 2023-02-23 MED ORDER — FUROSEMIDE 10 MG/ML IJ SOLN
40.0000 mg | Freq: Two times a day (BID) | INTRAMUSCULAR | Status: AC
  Administered 2023-02-23: 40 mg via INTRAVENOUS
  Filled 2023-02-23: qty 4

## 2023-02-23 NOTE — Progress Notes (Signed)
Patient ID: Jonathan Gonzalez, male   DOB: [birth year], 62 y.o.   MRN: [id number]     Advanced Heart Failure Rounding Note  PCP-Cardiologist: Donato Schultz, MD   Subjective:    Admitted with a fib RVR. Now on amiodarone gtt. Remains in atrial fibrillation, rates low 100s on amiodarone gtt 60 mg/hr.   Good diuresis yesterday, weight down 2 lbs.  Creatinine up to 1.5.   Objective:   Weight Range: 105.4 kg Body mass index is 35.33 kg/m.   Vital Signs:   Temp:  [97.8 F (36.6 C)-98.3 F (36.8 C)] 98.2 F (36.8 C) (06/08 0720) Pulse Rate:  [71-116] 116 (06/08 0720) Resp:  [16-19] 16 (06/08 0720) BP: (98-131)/(69-117) 108/85 (06/08 0720) SpO2:  [96 %-97 %] 97 % (06/08 0720) Weight:  [105.4 kg] 105.4 kg (06/08 0636) Last BM Date : 02/21/23  Weight change: Filed Weights   02/21/23 1508 02/22/23 0618 02/23/23 0636  Weight: 108 kg 106.4 kg 105.4 kg    Intake/Output:   Intake/Output Summary (Last 24 hours) at 02/23/2023 1027 Last data filed at 02/23/2023 0701 Gross per 24 hour  Intake 772.42 ml  Output 2050 ml  Net -1277.58 ml      Physical Exam  General: NAD Neck: JVP 10 cm, no thyromegaly or thyroid nodule.  Lungs: Clear to auscultation bilaterally with normal respiratory effort. CV: Nondisplaced PMI.  Heart mildly tachy, irregular S1/S2, no S3/S4, no murmur.  No peripheral edema.  N Abdomen: Soft, nontender, no hepatosplenomegaly, no distention.  Skin: Intact without lesions or rashes.  Neurologic: Alert and oriented x 3.  Psych: Normal affect. Extremities: No clubbing or cyanosis.  HEENT: Normal.    Telemetry   A fib 100s (Personally reviewed)     Labs    CBC Recent Labs    02/21/23 1430 02/21/23 1546  WBC 11.0* 10.1  NEUTROABS  --  7.7  HGB 14.1 13.4  HCT 47.7 44.9  MCV 75.6* 75.5*  PLT 388 341   Basic Metabolic Panel Recent Labs    86/57/84 1546 02/22/23 0037 02/23/23 0029  NA 140 136 133*  K 4.3 3.9 3.8  CL 102 105 100  CO2 19* 21* 22   GLUCOSE 120* 113* 110*  BUN 13 19 23   CREATININE 1.35* 1.36* 1.51*  CALCIUM 9.6 8.8* 8.9  MG 2.1  --   --    Liver Function Tests Recent Labs    02/21/23 1546  AST 21  ALT 19  ALKPHOS 48  BILITOT 0.5  PROT 6.5  ALBUMIN 3.6   No results for input(s): "LIPASE", "AMYLASE" in the last 72 hours. Cardiac Enzymes No results for input(s): "CKTOTAL", "CKMB", "CKMBINDEX", "TROPONINI" in the last 72 hours.  BNP: BNP (last 3 results) Recent Labs    01/29/23 1003 02/21/23 1430 02/21/23 1546  BNP 70.1 82.2 75.9    ProBNP (last 3 results) No results for input(s): "PROBNP" in the last 8760 hours.   D-Dimer No results for input(s): "DDIMER" in the last 72 hours. Hemoglobin A1C No results for input(s): "HGBA1C" in the last 72 hours. Fasting Lipid Panel No results for input(s): "CHOL", "HDL", "LDLCALC", "TRIG", "CHOLHDL", "LDLDIRECT" in the last 72 hours. Thyroid Function Tests Recent Labs    02/21/23 1546  TSH 4.380    Other results:   Imaging    No results found.   Medications:     Scheduled Medications:  apixaban  5 mg Oral BID   atorvastatin  10 mg Oral Daily   empagliflozin  10 mg Oral Daily   furosemide  40 mg Intravenous BID   guaiFENesin  600 mg Oral BID   insulin aspart  0-15 Units Subcutaneous TID WC   insulin aspart  0-5 Units Subcutaneous QHS   pantoprazole  40 mg Oral Daily   potassium chloride  40 mEq Oral Once   sacubitril-valsartan  1 tablet Oral BID   sodium chloride flush  3 mL Intravenous Q12H   spironolactone  12.5 mg Oral Daily    Infusions:  sodium chloride     amiodarone 60 mg/hr (02/23/23 0727)    PRN Medications: sodium chloride, acetaminophen, guaiFENesin-dextromethorphan, ondansetron (ZOFRAN) IV, sodium chloride flush    Patient Profile   62 y.o. male with history of HFrEF, atrial fibrillation (diagnosed 12/23), HTN, HLD, DM and systolic HF. Direct admitted yesterday from AHF clinic with a fib RVR.   Assessment/Plan   1. Paroxysmal atrial fibrillation with RVR - Diagnosed 12/23 - Successful DCCV in 3/24 - Repeat DC-CV 4/24  - Back in Afib/atrial tach with RVR 6/6 in AHF clinic - Continue amiodarone 60 mg/hr gtt, remains in afib 100s - Continue Eliquis, he has not missed doses per his report since last TEE.  - Plan DC-CV on Monday.  - Would aim for eventual ablation.   2. Acute on chronic systolic HF -Suspect possibly tachy-mediated. Discovered in setting of Afib with RVR in December 2023. Echo 12/23: EF 45-50%, RV okay, severe LAE. No prior echo for comparison. - Echo 03/24 in setting of Afib with RVR: EF 35-40% - TEE 4/24 EF 30% - Still looks at least mildly volume overloaded on exam.  Would continue Lasix 40 mg IV bid for 1 more day.  - Continue jardiance 10 mg daily - Continue spironolactone 12.5 mg daily - Continue Entresto 24/26 bid  - strict I&O, daily weights   3. DM II -Last A1c 6.6% -SSI   4. HLD Continue Atorvastatin  5. CKD: Stage 3 - Baseline creatinine appears to be around 1.5.   Length of Stay: 2  Marca Ancona, MD  02/23/2023, 10:27 AM  Advanced Heart Failure Team Pager [phone] (M-F; 7a - 5p)  Please contact [facility] for night-coverage after hours (5p -7a ) and weekends on [email or web address]

## 2023-02-23 NOTE — Plan of Care (Signed)

## 2023-02-24 DIAGNOSIS — I4891 Unspecified atrial fibrillation: Secondary | ICD-10-CM | POA: Diagnosis not present

## 2023-02-24 LAB — BASIC METABOLIC PANEL
Anion gap: 17 — ABNORMAL HIGH (ref 5–15)
BUN: 20 mg/dL (ref 8–23)
CO2: 21 mmol/L — ABNORMAL LOW (ref 22–32)
Calcium: 7.9 mg/dL — ABNORMAL LOW (ref 8.9–10.3)
Chloride: 90 mmol/L — ABNORMAL LOW (ref 98–111)
Creatinine, Ser: 1.42 mg/dL — ABNORMAL HIGH (ref 0.61–1.24)
GFR, Estimated: 56 mL/min — ABNORMAL LOW (ref >60)
Glucose, Bld: 427 mg/dL — ABNORMAL HIGH (ref 70–99)
Potassium: 3.3 mmol/L — ABNORMAL LOW (ref 3.5–5.1)
Sodium: 128 mmol/L — ABNORMAL LOW (ref 135–145)

## 2023-02-24 LAB — PROTIME-INR
INR: 1.3 — ABNORMAL HIGH (ref 0.8–1.2)
Prothrombin Time: 16 seconds — ABNORMAL HIGH (ref 11.4–15.2)

## 2023-02-24 LAB — GLUCOSE, CAPILLARY
Glucose-Capillary: 132 mg/dL — ABNORMAL HIGH (ref 70–99)
Glucose-Capillary: 84 mg/dL (ref 70–99)
Glucose-Capillary: 106 mg/dL — ABNORMAL HIGH (ref 70–99)
Glucose-Capillary: 125 mg/dL — ABNORMAL HIGH (ref 70–99)

## 2023-02-24 MED ORDER — FUROSEMIDE 10 MG/ML IJ SOLN
40.0000 mg | Freq: Two times a day (BID) | INTRAMUSCULAR | Status: AC
  Administered 2023-02-24: 40 mg via INTRAVENOUS
  Filled 2023-02-24: qty 4

## 2023-02-24 MED ORDER — SODIUM CHLORIDE 0.9 % IV SOLN: INTRAVENOUS | Status: DC

## 2023-02-24 MED ORDER — POTASSIUM CHLORIDE CRYS ER 20 MEQ PO TBCR
60.0000 meq | EXTENDED_RELEASE_TABLET | Freq: Once | ORAL | Status: AC
  Administered 2023-02-24: 60 meq via ORAL
  Filled 2023-02-24: qty 3

## 2023-02-24 NOTE — Progress Notes (Signed)
Patient ID: Jonathan Gonzalez, male   DOB: October 11, 1960, 62 y.o.   MRN: [id number]     Advanced Heart Failure Rounding Note  PCP-Cardiologist: Donato Schultz, MD   Subjective:    - Remains in afib with RVR; rates 100-110  - JVP 10cm - negative 2L since admission, sCr 1.42 today.   Objective:   Weight Range: 104.3 kg Body mass index is 34.96 kg/m.   Vital Signs:   Temp:  [97.8 F (36.6 C)-98.2 F (36.8 C)] 98.2 F (36.8 C) (06/09 1125) Pulse Rate:  [85-99] 95 (06/09 1125) Resp:  [16-20] 18 (06/09 1125) BP: (115-123)/(73-87) 115/85 (06/09 1125) SpO2:  [93 %-97 %] 93 % (06/09 1125) Weight:  [104.3 kg] 104.3 kg (06/09 0428) Last BM Date : 02/23/23  Weight change: Filed Weights   02/22/23 0618 02/23/23 0636 02/24/23 0428  Weight: 106.4 kg 105.4 kg 104.3 kg    Intake/Output:   Intake/Output Summary (Last 24 hours) at 02/24/2023 1337 Last data filed at 02/24/2023 1129 Gross per 24 hour  Intake 758.24 ml  Output 1500 ml  Net -741.76 ml       Physical Exam  General: NAD Neck: JVP 10 cm with respiratory variation, no thyromegaly or thyroid nodule.  Lungs: Clear to auscultation bilaterally with normal respiratory effort. CV: Nondisplaced PMI.  Heart mildly tachy, irregular S1/S2, no S3/S4, no murmur.  No peripheral edema.  N Abdomen: Soft, nontender, no hepatosplenomegaly, no distention.  Skin: Intact without lesions or rashes.  Neurologic: Alert and oriented x 3.  Psych: Normal affect. Extremities: No clubbing or cyanosis.  HEENT: Normal.    Telemetry   A fib 100s (Personally reviewed)     Labs    CBC Recent Labs    02/21/23 1430 02/21/23 1546  WBC 11.0* 10.1  NEUTROABS  --  7.7  HGB 14.1 13.4  HCT 47.7 44.9  MCV 75.6* 75.5*  PLT 388 341    Basic Metabolic Panel Recent Labs    40/98/11 1546 02/22/23 0037 02/23/23 0029 02/24/23 0017  NA 140   < > 133* 128*  K 4.3   < > 3.8 3.3*  CL 102   < > 100 90*  CO2 19*   < > 22 21*  GLUCOSE 120*   < > 110*  427*  BUN 13   < > 23 20  CREATININE 1.35*   < > 1.51* 1.42*  CALCIUM 9.6   < > 8.9 7.9*  MG 2.1  --   --   --    < > = values in this interval not displayed.    Liver Function Tests Recent Labs    02/21/23 1546  AST 21  ALT 19  ALKPHOS 48  BILITOT 0.5  PROT 6.5  ALBUMIN 3.6    No results for input(s): "LIPASE", "AMYLASE" in the last 72 hours. Cardiac Enzymes No results for input(s): "CKTOTAL", "CKMB", "CKMBINDEX", "TROPONINI" in the last 72 hours.  BNP: BNP (last 3 results) Recent Labs    01/29/23 1003 02/21/23 1430 02/21/23 1546  BNP 70.1 82.2 75.9    Recent Labs    02/21/23 1546  TSH 4.380     Other results:   Imaging    No results found.   Medications:     Scheduled Medications:  apixaban  5 mg Oral BID   atorvastatin  10 mg Oral Daily   empagliflozin  10 mg Oral Daily   guaiFENesin  600 mg Oral BID   insulin aspart  0-15  Units Subcutaneous TID WC   insulin aspart  0-5 Units Subcutaneous QHS   pantoprazole  40 mg Oral Daily   sacubitril-valsartan  1 tablet Oral BID   sodium chloride flush  3 mL Intravenous Q12H   spironolactone  12.5 mg Oral Daily    Infusions:  sodium chloride     amiodarone 60 mg/hr (02/24/23 1001)    PRN Medications: sodium chloride, acetaminophen, guaiFENesin-dextromethorphan, ondansetron (ZOFRAN) IV, sodium chloride flush    Patient Profile   62 y.o. male with history of HFrEF, atrial fibrillation (diagnosed 12/23), HTN, HLD, DM and systolic HF. Direct admitted yesterday from AHF clinic with a fib RVR.   Assessment/Plan  1. Paroxysmal atrial fibrillation with RVR - Diagnosed 12/23 - Successful DCCV in 3/24 - Repeat DC-CV 4/24  - Back in Afib/atrial tach with RVR 6/6 in AHF clinic - Continue amiodarone 60 mg/hr gtt, remains in afib 100s - Continue Eliquis, he has not missed doses per his report since last TEE.  - Plan DC-CV on Monday. Npo TONIGHT.  - Would aim for eventual ablation.   2. Acute on  chronic systolic HF -Suspect possibly tachy-mediated. Discovered in setting of Afib with RVR in December 2023. Echo 12/23: EF 45-50%, RV okay, severe LAE. No prior echo for comparison. - Echo 03/24 in setting of Afib with RVR: EF 35-40% - TEE 4/24 EF 30% - Continue jardiance 10 mg daily - Continue spironolactone 12.5 mg daily - Continue Entresto 24/26 bid  - SOB this AM with elevated JVP. Restart IV lasix 40mg  BID. - strict I&O, daily weights   3. DM II -Last A1c 6.6% -SSI   4. HLD Continue Atorvastatin  5. CKD: Stage 3 - Baseline creatinine appears to be around 1.5.  - stable, sCr 1.42 today.   Length of Stay: 3  Nashly Olsson, DO  02/24/2023, 1:37 PM  Advanced Heart Failure Team Pager [phone] (M-F; 7a - 5p)  Please contact [facility] for night-coverage after hours (5p -7a ) and weekends on [email or web address]

## 2023-02-25 ENCOUNTER — Other Ambulatory Visit: Payer: Self-pay

## 2023-02-25 ENCOUNTER — Inpatient Hospital Stay (HOSPITAL_COMMUNITY): Payer: 59 | Admitting: Certified Registered Nurse Anesthetist

## 2023-02-25 ENCOUNTER — Encounter (HOSPITAL_COMMUNITY)
Admission: AD | Disposition: A | Discharge status: Home / Self Care | Payer: Self-pay | Source: Other Acute Inpatient Hospital | Attending: Cardiology

## 2023-02-25 ENCOUNTER — Encounter (HOSPITAL_COMMUNITY): Payer: Self-pay | Admitting: Internal Medicine

## 2023-02-25 DIAGNOSIS — I4891 Unspecified atrial fibrillation: Secondary | ICD-10-CM

## 2023-02-25 DIAGNOSIS — E119 Type 2 diabetes mellitus without complications: Secondary | ICD-10-CM

## 2023-02-25 DIAGNOSIS — I509 Heart failure, unspecified: Secondary | ICD-10-CM | POA: Diagnosis not present

## 2023-02-25 DIAGNOSIS — I110 Hypertensive heart disease with heart failure: Secondary | ICD-10-CM | POA: Diagnosis not present

## 2023-02-25 DIAGNOSIS — I5023 Acute on chronic systolic (congestive) heart failure: Secondary | ICD-10-CM | POA: Diagnosis not present

## 2023-02-25 HISTORY — PX: CARDIOVERSION: SHX1299

## 2023-02-25 LAB — GLUCOSE, CAPILLARY
Glucose-Capillary: 85 mg/dL (ref 70–99)
Glucose-Capillary: 115 mg/dL — ABNORMAL HIGH (ref 70–99)
Glucose-Capillary: 79 mg/dL (ref 70–99)
Glucose-Capillary: 75 mg/dL (ref 70–99)
Glucose-Capillary: 129 mg/dL — ABNORMAL HIGH (ref 70–99)
Glucose-Capillary: 104 mg/dL — ABNORMAL HIGH (ref 70–99)

## 2023-02-25 SURGERY — CARDIOVERSION
Anesthesia: General

## 2023-02-25 MED ORDER — FUROSEMIDE 10 MG/ML IJ SOLN
80.0000 mg | Freq: Once | INTRAMUSCULAR | Status: AC
  Administered 2023-02-25: 80 mg via INTRAVENOUS
  Filled 2023-02-25: qty 8

## 2023-02-25 MED ORDER — LIDOCAINE 2% (20 MG/ML) 5 ML SYRINGE
INTRAMUSCULAR | Status: DC | PRN
  Administered 2023-02-25: 80 mg via INTRAVENOUS

## 2023-02-25 MED ORDER — PROPOFOL 10 MG/ML IV BOLUS
INTRAVENOUS | Status: DC | PRN
  Administered 2023-02-25: 70 mg via INTRAVENOUS

## 2023-02-25 MED ORDER — FUROSEMIDE 40 MG PO TABS: 40.0000 mg | ORAL_TABLET | Freq: Every day | ORAL | Status: DC

## 2023-02-25 MED ORDER — PHENYLEPHRINE 80 MCG/ML (10ML) SYRINGE FOR IV PUSH (FOR BLOOD PRESSURE SUPPORT)
PREFILLED_SYRINGE | INTRAVENOUS | Status: DC | PRN
  Administered 2023-02-25: 160 ug via INTRAVENOUS

## 2023-02-25 SURGICAL SUPPLY — 1 items: ELECT DEFIB PAD ADLT CADENCE (PAD) ×2 IMPLANT

## 2023-02-25 NOTE — H&P (View-Only) (Signed)
Patient ID: Jonathan Gonzalez, male   DOB: 10/28/1960, 62 y.o.   MRN: [id number]     Advanced Heart Failure Rounding Note  PCP-Cardiologist: Mark Skains, MD   Subjective:   - Remains in afib, rates in 90s now - negative 2.7L since admission, sCr pending  Did not walk over the weekend (didn't know he could). NPO for DCCV today. Feels fine. No CP. Breathing improved.  Objective:   Weight Range: 104.2 kg Body mass index is 34.93 kg/m.   Vital Signs:   Temp:  [97.6 F (36.4 C)-98.3 F (36.8 C)] 97.6 F (36.4 C) (06/10 0436) Pulse Rate:  [92-105] 103 (06/10 0607) Resp:  [18-23] 23 (06/10 0607) BP: (113-127)/(73-85) 127/83 (06/10 0436) SpO2:  [90 %-96 %] 90 % (06/10 0607) Weight:  [104.2 kg] 104.2 kg (06/10 0607) Last BM Date : 02/23/23  Weight change: Filed Weights   02/23/23 0636 02/24/23 0428 02/25/23 0607  Weight: 105.4 kg 104.3 kg 104.2 kg    Intake/Output:   Intake/Output Summary (Last 24 hours) at 02/25/2023 0706 Last data filed at 02/25/2023 0438 Gross per 24 hour  Intake 250.54 ml  Output 1000 ml  Net -749.46 ml      Physical Exam  General:  well appearing.  No respiratory difficulty HEENT: normal Neck: supple. JVD ~8 cm. Carotids 2+ bilat; no bruits. No lymphadenopathy or thyromegaly appreciated. Cor: PMI nondisplaced. Regular rate & irregular rhythm. No rubs, gallops or murmurs. Lungs: clear, diminished bases Abdomen: soft, nontender, nondistended. No hepatosplenomegaly. No bruits or masses. Good bowel sounds. Extremities: no cyanosis, clubbing, rash, edema  Neuro: alert & oriented x 3, cranial nerves grossly intact. moves all 4 extremities w/o difficulty. Affect pleasant.  Telemetry  A fib 90s (Personally reviewed)   Labs    CBC No results for input(s): "WBC", "NEUTROABS", "HGB", "HCT", "MCV", "PLT" in the last 72 hours. Basic Metabolic Panel Recent Labs    02/23/23 0029 02/24/23 0017  NA 133* 128*  K 3.8 3.3*  CL 100 90*  CO2 22 21*  GLUCOSE  110* 427*  BUN 23 20  CREATININE 1.51* 1.42*  CALCIUM 8.9 7.9*   Liver Function Tests No results for input(s): "AST", "ALT", "ALKPHOS", "BILITOT", "PROT", "ALBUMIN" in the last 72 hours. No results for input(s): "LIPASE", "AMYLASE" in the last 72 hours. Cardiac Enzymes No results for input(s): "CKTOTAL", "CKMB", "CKMBINDEX", "TROPONINI" in the last 72 hours.  BNP: BNP (last 3 results) Recent Labs    01/29/23 1003 02/21/23 1430 02/21/23 1546  BNP 70.1 82.2 75.9   No results for input(s): "TSH", "T4TOTAL", "T3FREE", "THYROIDAB" in the last 72 hours.  Invalid input(s): "FREET3"  Other results:   Imaging    No results found.   Medications:     Scheduled Medications:  apixaban  5 mg Oral BID   atorvastatin  10 mg Oral Daily   empagliflozin  10 mg Oral Daily   furosemide  40 mg Intravenous BID   guaiFENesin  600 mg Oral BID   insulin aspart  0-15 Units Subcutaneous TID WC   insulin aspart  0-5 Units Subcutaneous QHS   pantoprazole  40 mg Oral Daily   sacubitril-valsartan  1 tablet Oral BID   sodium chloride flush  3 mL Intravenous Q12H   spironolactone  12.5 mg Oral Daily    Infusions:  sodium chloride     sodium chloride 20 mL/hr at 02/24/23 2208   amiodarone 60 mg/hr (02/25/23 0609)    PRN Medications: sodium chloride, acetaminophen, guaiFENesin-dextromethorphan,   ondansetron (ZOFRAN) IV, sodium chloride flush    Patient Profile   62 y.o. male with history of HFrEF, atrial fibrillation (diagnosed 12/23), HTN, HLD, DM and systolic HF. Direct admitted yesterday from AHF clinic with a fib RVR.   Assessment/Plan  1. Paroxysmal atrial fibrillation with RVR - Diagnosed 12/23 - Successful DCCV 3/24, repeat DC-CV 4/24  - Back in Afib/atrial tach with RVR 6/6 in AHF clinic - Continue amiodarone 60 mg/hr gtt, remains in afib 100s - Continue Eliquis, he has not missed doses per his report since last TEE.  - Plan DC-CV today.  - Would aim for eventual ablation.    2. Acute on chronic systolic HF -Suspect possibly tachy-mediated. Discovered in setting of Afib with RVR in December 2023. Echo 12/23: EF 45-50%, RV okay, severe LAE. No prior echo for comparison. - Echo 03/24 in setting of Afib with RVR: EF 35-40% - TEE 4/24 EF 30% - appears euvolemic, will stop 40 IV lasix BID after morning dose and transition to 40 mg lasix daily PO - Continue jardiance 10 mg daily - Continue spironolactone 12.5 mg daily - Continue Entresto 24/26 bid  - strict I&O, daily weights   3. DM II -Last A1c 6.6% -SSI   4. HLD - Continue Atorvastatin  5. CKD: Stage 3 - Baseline creatinine appears to be around 1.5.  - stable, sCr 1.42, pending today  Length of Stay: 4  Alma L Diaz, NP  02/25/2023, 7:06 AM  Advanced Heart Failure Team Pager [phone] (M-F; 7a - 5p)  Please contact [facility] for night-coverage after hours (5p -7a ) and weekends on [email or web address]  Patient seen and examined with the above-signed Advanced Practice Provider and/or Housestaff. I personally reviewed laboratory data, imaging studies and relevant notes. I independently examined the patient and formulated the important aspects of the plan. I have edited the note to reflect any of my changes or salient points. I have personally discussed the plan with the patient and/or family.  Remains in AF on IV amio. Also on IV lasix. Denies SOB, orthopnea or PND.   NPO for DC-CV today  General:  Sitting up. No resp difficulty HEENT: normal Neck: supple. JVP 7. Carotids 2+ bilat; no bruits. No lymphadenopathy or thryomegaly appreciated. Cor: PMI nondisplaced. Irregular rate & rhythm. No rubs, gallops or murmurs. Lungs: clear Abdomen: soft, nontender, nondistended. No hepatosplenomegaly. No bruits or masses. Good bowel sounds. Extremities: no cyanosis, clubbing, rash, edema Neuro: alert & orientedx3, cranial nerves grossly intact. moves all 4 extremities w/o difficulty. Affect pleasant  Volume status  much improved.  Remains in AF on IV amio. On Eliquis.   For DC-CV. Agree with switching to po diuretics and titrating GDMT.  Lakeasha Petion, MD  8:30 AM     

## 2023-02-25 NOTE — Progress Notes (Addendum)
Patient ID: Jonathan Gonzalez, male   DOB: April 01, 1961, 62 y.o.   MRN: [id number]     Advanced Heart Failure Rounding Note  PCP-Cardiologist: Donato Schultz, MD   Subjective:   - Remains in afib, rates in 90s now - negative 2.7L since admission, sCr pending  Did not walk over the weekend (didn't know he could). NPO for DCCV today. Feels fine. No CP. Breathing improved.  Objective:   Weight Range: 104.2 kg Body mass index is 34.93 kg/m.   Vital Signs:   Temp:  [97.6 F (36.4 C)-98.3 F (36.8 C)] 97.6 F (36.4 C) (06/10 0436) Pulse Rate:  [92-105] 103 (06/10 0607) Resp:  [18-23] 23 (06/10 0607) BP: (113-127)/(73-85) 127/83 (06/10 0436) SpO2:  [90 %-96 %] 90 % (06/10 0607) Weight:  [104.2 kg] 104.2 kg (06/10 0607) Last BM Date : 02/23/23  Weight change: Filed Weights   02/23/23 0636 02/24/23 0428 02/25/23 0607  Weight: 105.4 kg 104.3 kg 104.2 kg    Intake/Output:   Intake/Output Summary (Last 24 hours) at 02/25/2023 0706 Last data filed at 02/25/2023 0438 Gross per 24 hour  Intake 250.54 ml  Output 1000 ml  Net -749.46 ml      Physical Exam  General:  well appearing.  No respiratory difficulty HEENT: normal Neck: supple. JVD ~8 cm. Carotids 2+ bilat; no bruits. No lymphadenopathy or thyromegaly appreciated. Cor: PMI nondisplaced. Regular rate & irregular rhythm. No rubs, gallops or murmurs. Lungs: clear, diminished bases Abdomen: soft, nontender, nondistended. No hepatosplenomegaly. No bruits or masses. Good bowel sounds. Extremities: no cyanosis, clubbing, rash, edema  Neuro: alert & oriented x 3, cranial nerves grossly intact. moves all 4 extremities w/o difficulty. Affect pleasant.  Telemetry  A fib 90s (Personally reviewed)   Labs    CBC No results for input(s): "WBC", "NEUTROABS", "HGB", "HCT", "MCV", "PLT" in the last 72 hours. Basic Metabolic Panel Recent Labs    40/98/11 0029 02/24/23 0017  NA 133* 128*  K 3.8 3.3*  CL 100 90*  CO2 22 21*  GLUCOSE  110* 427*  BUN 23 20  CREATININE 1.51* 1.42*  CALCIUM 8.9 7.9*   Liver Function Tests No results for input(s): "AST", "ALT", "ALKPHOS", "BILITOT", "PROT", "ALBUMIN" in the last 72 hours. No results for input(s): "LIPASE", "AMYLASE" in the last 72 hours. Cardiac Enzymes No results for input(s): "CKTOTAL", "CKMB", "CKMBINDEX", "TROPONINI" in the last 72 hours.  BNP: BNP (last 3 results) Recent Labs    01/29/23 1003 02/21/23 1430 02/21/23 1546  BNP 70.1 82.2 75.9   No results for input(s): "TSH", "T4TOTAL", "T3FREE", "THYROIDAB" in the last 72 hours.  Invalid input(s): "FREET3"  Other results:   Imaging    No results found.   Medications:     Scheduled Medications:  apixaban  5 mg Oral BID   atorvastatin  10 mg Oral Daily   empagliflozin  10 mg Oral Daily   furosemide  40 mg Intravenous BID   guaiFENesin  600 mg Oral BID   insulin aspart  0-15 Units Subcutaneous TID WC   insulin aspart  0-5 Units Subcutaneous QHS   pantoprazole  40 mg Oral Daily   sacubitril-valsartan  1 tablet Oral BID   sodium chloride flush  3 mL Intravenous Q12H   spironolactone  12.5 mg Oral Daily    Infusions:  sodium chloride     sodium chloride 20 mL/hr at 02/24/23 2208   amiodarone 60 mg/hr (02/25/23 0609)    PRN Medications: sodium chloride, acetaminophen, guaiFENesin-dextromethorphan,  ondansetron (ZOFRAN) IV, sodium chloride flush    Patient Profile   62 y.o. male with history of HFrEF, atrial fibrillation (diagnosed 12/23), HTN, HLD, DM and systolic HF. Direct admitted yesterday from AHF clinic with a fib RVR.   Assessment/Plan  1. Paroxysmal atrial fibrillation with RVR - Diagnosed 12/23 - Successful DCCV 3/24, repeat DC-CV 4/24  - Back in Afib/atrial tach with RVR 6/6 in AHF clinic - Continue amiodarone 60 mg/hr gtt, remains in afib 100s - Continue Eliquis, he has not missed doses per his report since last TEE.  - Plan DC-CV today.  - Would aim for eventual ablation.    2. Acute on chronic systolic HF -Suspect possibly tachy-mediated. Discovered in setting of Afib with RVR in December 2023. Echo 12/23: EF 45-50%, RV okay, severe LAE. No prior echo for comparison. - Echo 03/24 in setting of Afib with RVR: EF 35-40% - TEE 4/24 EF 30% - appears euvolemic, will stop 40 IV lasix BID after morning dose and transition to 40 mg lasix daily PO - Continue jardiance 10 mg daily - Continue spironolactone 12.5 mg daily - Continue Entresto 24/26 bid  - strict I&O, daily weights   3. DM II -Last A1c 6.6% -SSI   4. HLD - Continue Atorvastatin  5. CKD: Stage 3 - Baseline creatinine appears to be around 1.5.  - stable, sCr 1.42, pending today  Length of Stay: 4  Alen Bleacher, NP  02/25/2023, 7:06 AM  Advanced Heart Failure Team Pager [phone] (M-F; 7a - 5p)  Please contact [facility] for night-coverage after hours (5p -7a ) and weekends on [email or web address]  Patient seen and examined with the above-signed Advanced Practice Provider and/or Housestaff. I personally reviewed laboratory data, imaging studies and relevant notes. I independently examined the patient and formulated the important aspects of the plan. I have edited the note to reflect any of my changes or salient points. I have personally discussed the plan with the patient and/or family.  Remains in AF on IV amio. Also on IV lasix. Denies SOB, orthopnea or PND.   NPO for DC-CV today  General:  Sitting up. No resp difficulty HEENT: normal Neck: supple. JVP 7. Carotids 2+ bilat; no bruits. No lymphadenopathy or thryomegaly appreciated. Cor: PMI nondisplaced. Irregular rate & rhythm. No rubs, gallops or murmurs. Lungs: clear Abdomen: soft, nontender, nondistended. No hepatosplenomegaly. No bruits or masses. Good bowel sounds. Extremities: no cyanosis, clubbing, rash, edema Neuro: alert & orientedx3, cranial nerves grossly intact. moves all 4 extremities w/o difficulty. Affect pleasant  Volume status  much improved.  Remains in AF on IV amio. On Eliquis.   For DC-CV. Agree with switching to po diuretics and titrating GDMT.  Arvilla Meres, MD  8:30 AM

## 2023-02-25 NOTE — Anesthesia Postprocedure Evaluation (Signed)
Anesthesia Post Note  Patient: Jonathan Gonzalez  Procedure(s) Performed: CARDIOVERSION     Patient location during evaluation: PACU Anesthesia Type: General Level of consciousness: awake and alert Pain management: pain level controlled Vital Signs Assessment: post-procedure vital signs reviewed and stable Respiratory status: spontaneous breathing, nonlabored ventilation and respiratory function stable Cardiovascular status: blood pressure returned to baseline and stable Postop Assessment: no apparent nausea or vomiting Anesthetic complications: no   No notable events documented.  Last Vitals:  Vitals:   02/25/23 1115 02/25/23 1130  BP: 138/86 (!) 145/83  Pulse: 82 77  Resp: 18 18  Temp:    SpO2: 96% 97%    Last Pain:  Vitals:   02/25/23 1112  TempSrc: Oral  PainSc:                  Lowella Curb

## 2023-02-25 NOTE — Anesthesia Preprocedure Evaluation (Signed)
Anesthesia Evaluation  Patient identified by MRN, date of birth, ID band Patient awake    Reviewed: Allergy & Precautions, NPO status , Patient's Chart, lab work & pertinent test results  Airway Mallampati: II  TM Distance: >3 FB Neck ROM: Full    Dental no notable dental hx. (+) Teeth Intact, Dental Advisory Given   Pulmonary    Pulmonary exam normal breath sounds clear to auscultation       Cardiovascular hypertension, Pt. on medications +CHF  Normal cardiovascular exam+ dysrhythmias Atrial Fibrillation  Rhythm:Regular Rate:Normal  12/03/2022 ECHo 1. Left ventricular ejection fraction, by estimation, is 35 to 40%. The  left ventricle has moderately decreased function. The left ventricle  demonstrates global hypokinesis. Left ventricular diastolic function could  not be evaluated.   2. Right ventricular systolic function is mildly reduced. The right  ventricular size is normal. There is normal pulmonary artery systolic  pressure. The estimated right ventricular systolic pressure is 35.5 mmHg.   3. Left atrial size was mildly dilated.   4. The mitral valve is abnormal. Moderate mitral valve regurgitation. No  evidence of mitral stenosis.   5. Tricuspid valve regurgitation is mild to moderate.   6. The aortic valve is tricuspid. Aortic valve regurgitation is not  visualized. No aortic stenosis is present.   7. The inferior vena cava is dilated in size with >50% respiratory  variability, suggesting right atrial pressure of 8 mmHg.     Neuro/Psych    GI/Hepatic Neg liver ROS,GERD  ,,  Endo/Other  diabetes, Type 2    Renal/GU Lab Results      Component                Value               Date                      CREATININE               1.24                12/05/2022                   K                        3.2 (L)             12/05/2022                     Musculoskeletal   Abdominal  (+) + obese (BMI 35.64)   Peds  Hematology Lab Results      Component                Value               Date                      WBC                      10.1                12/05/2022                HGB                      12.1 (L)  12/05/2022                HCT                      39.5                12/05/2022                         PLT                      375                 12/05/2022              Anesthesia Other Findings   Reproductive/Obstetrics                             Anesthesia Physical Anesthesia Plan  ASA: 3  Anesthesia Plan: General   Post-op Pain Management: Minimal or no pain anticipated   Induction: Intravenous  PONV Risk Score and Plan: Treatment may vary due to age or medical condition and Propofol infusion  Airway Management Planned: Mask  Additional Equipment: None  Intra-op Plan:   Post-operative Plan:   Informed Consent: I have reviewed the patients History and Physical, chart, labs and discussed the procedure including the risks, benefits and alternatives for the proposed anesthesia with the patient or authorized representative who has indicated his/her understanding and acceptance.     Dental advisory given  Plan Discussed with:   Anesthesia Plan Comments:         Anesthesia Quick Evaluation

## 2023-02-25 NOTE — Progress Notes (Signed)
Patient refusing to continue on Amio drip. Stated "It burns and my arm is hurting". IV flushes with ease and draws back. Did not burn during flush. Patient educated on effects of stopping the Amio drip. MD paged - waiting for return call.

## 2023-02-25 NOTE — TOC Initial Note (Signed)
Transition of Care [facility]) - Initial/Assessment Note    Patient Details  Name: Jonathan Gonzalez MRN: [id number] Date of Birth: 14-Jun-1961  Transition of Care [facility]) CM/SW Contact:    Elliot Cousin, RN Phone Number: [phone] 02/25/2023, 3:41 PM  Clinical Narrative:   HF TOC CM spoke to pt and states he was on waiting list for housing. He and son are currently staying motel. Pt reports using public transportation for appts. Will need ride for home. Provided pt with taxi voucher to call at dc. Has scale at home for daily weights.               Expected Discharge Plan: Home/Self Care Barriers to Discharge: Continued Medical Work up   Patient Goals and CMS Choice Patient states their goals for this hospitalization and ongoing recovery are:: wants to remain independent          Expected Discharge Plan and Services   Discharge Planning Services: CM Consult   Living arrangements for the past 2 months: Hotel/Motel                                      Prior Living Arrangements/Services Living arrangements for the past 2 months: Hotel/Motel Lives with:: Adult Children Patient language and need for interpreter reviewed:: Yes Do you feel safe going back to the place where you live?: Yes      Need for Family Participation in Patient Care: No (Comment) Care giver support system in place?: Yes (comment) Current home services: DME (scale) Criminal Activity/Legal Involvement Pertinent to Current Situation/Hospitalization: No - Comment as needed  Activities of Daily Living      Permission Sought/Granted Permission sought to share information with : Case Manager, Family Supports, PCP Permission granted to share information with : Yes, Verbal Permission Granted  Share Information with NAME: Crisanto Nied     Permission granted to share info w Relationship: son  Permission granted to share info w Contact Information: [phone]  Emotional  Assessment Appearance:: Appears stated age Attitude/Demeanor/Rapport: Engaged Affect (typically observed): Accepting Orientation: : Oriented to Self, Oriented to Place, Oriented to  Time, Oriented to Situation   Psych Involvement: No (comment)  Admission diagnosis:  Atrial fibrillation with RVR (HCC) [I48.91] Patient Active Problem List   Diagnosis Date Noted   Heart failure with mildly reduced ejection fraction (HFmrEF) (HCC) 12/03/2022   Hypercoagulable state due to persistent atrial fibrillation (HCC) 10/08/2022   DM (diabetes mellitus) (HCC) 09/14/2022   HTN (hypertension) 09/14/2022   HLD (hyperlipidemia) 09/14/2022   Atrial fibrillation with RVR (HCC) 09/14/2022   PCP:  Claiborne Rigg, NP Pharmacy:   [facility] [address] [place] [address] Phone: [phone] Fax: [phone]  [facility] [address] [place] [address] Phone: [phone] Fax: [phone]     Social Determinants of Health (SDOH) Social History: SDOH Screenings   Food Insecurity: No Food Insecurity (12/05/2022)  Housing: Medium Risk (01/04/2023)  Transportation Needs: Unmet Transportation Needs (01/04/2023)  Utilities: Not At Risk (12/04/2022)  Alcohol Screen: Low Risk  (12/05/2022)  Depression (PHQ2-9): Medium Risk (12/28/2022)  Financial Resource Strain: Medium Risk (12/05/2022)  Tobacco Use: Low Risk  (02/25/2023)   SDOH Interventions:     Readmission Risk Interventions     No data to display

## 2023-02-25 NOTE — Interval H&P Note (Signed)
History and Physical Interval Note:  02/25/2023 10:03 AM  Jonathan Gonzalez  has presented today for surgery, with the diagnosis of afib.  The various methods of treatment have been discussed with the patient and family. After consideration of risks, benefits and other options for treatment, the patient has consented to  Procedure(s): CARDIOVERSION (N/A) as a surgical intervention.  The patient's history has been reviewed, patient examined, no change in status, stable for surgery.  I have reviewed the patient's chart and labs.  Questions were answered to the patient's satisfaction.     Ninfa Giannelli

## 2023-02-25 NOTE — Transfer of Care (Signed)
Immediate Anesthesia Transfer of Care Note  Patient: Jonathan Gonzalez  Procedure(s) Performed: CARDIOVERSION  Patient Location: Cath Lab  Anesthesia Type:General  Level of Consciousness: drowsy and patient cooperative  Airway & Oxygen Therapy: Patient Spontanous Breathing and Patient connected to nasal cannula oxygen  Post-op Assessment: Report given to RN, Post -op Vital signs reviewed and stable, and Patient moving all extremities X 4  Post vital signs: Reviewed and stable  Last Vitals:  Vitals Value Taken Time  BP 130/93 02/25/23 1000  Temp    Pulse 94 02/25/23 1002  Resp 16 02/25/23 1002  SpO2 99 % 02/25/23 1002  Vitals shown include unvalidated device data.  Last Pain:  Vitals:   02/25/23 0924  TempSrc:   PainSc: 0-No pain         Complications: No notable events documented.

## 2023-02-25 NOTE — CV Procedure (Signed)
    DIRECT CURRENT CARDIOVERSION  NAME:  ESAW KNIPPEL   MRN: [id number] DOB:  [birth year]   ADMIT DATE: 02/21/2023   INDICATIONS: Atrial fibrillation    PROCEDURE:   Informed consent was obtained prior to the procedure. The risks, benefits and alternatives for the procedure were discussed and the patient comprehended these risks. Once an appropriate time out was taken, the patient had the defibrillator pads placed in the anterior and posterior position. The patient then underwent sedation by the anesthesia service. Once an appropriate level of sedation was achieved, the patient received a single biphasic, synchronized 200J shock with prompt conversion to sinus rhythm. No apparent complications.  Arvilla Meres, MD  10:41 AM

## 2023-02-25 NOTE — TOC CM/SW Note (Signed)
..  02/25/2023  Jonathan Gonzalez DOB: 1960/09/25 MRN: [id number]   RIDER WAIVER AND RELEASE OF LIABILITY  For the purposes of helping with transportation needs, [facility] partners with outside transportation providers (taxi companies, [place], Catering manager.) to give [employer] patients or other approved people the choice of on-demand rides [employer]") to our buildings for non-emergency visits.  By using [employer], I, the person signing this document, on behalf of myself and/or any legal minors (in my care using the [employer]), agree:  Science writer given to me are supplied by independent, outside transportation providers who do not work for, or have any affiliation with, [employer]. [facility] is not a transportation company. [place] has no control over the quality or safety of the rides I get using [employer]. [facility] has no control over whether any outside ride will happen on time or not. [facility] gives no guarantee on the reliability, quality, safety, or availability on any rides, or that no mistakes will happen. I know and accept that traveling by vehicle (car, truck, SVU, Zenaida Niece, bus, taxi, etc.) has risks of serious injuries such as disability, being paralyzed, and death. I know and agree the risk of using [employer] is mine alone, and not [employer]. Transport Services are provided "as is" and as are available. The transportation providers are in charge for all inspections and care of the vehicles used to provide these rides. I agree not to take legal action against [place], its agents, employees, officers, directors, representatives, insurers, attorneys, assigns, successors, subsidiaries, and affiliates at any time for any reasons related directly or indirectly to using [employer]. I also agree not to take legal action against [facility] or its affiliates for any injury, death, or damage to property caused by or related to using  [employer]. I have read this Waiver and Release of Liability, and I understand the terms used in it and their legal meaning. This Waiver is freely and voluntarily given with the understanding that my right (or any legal minors) to legal action against [place] relating to [employer] is knowingly given up to use these services.   I attest that I read the Ride Waiver and Release of Liability to Jonathan Gonzalez, gave Mr. Jonathan Gonzalez the opportunity to ask questions and answered the questions asked (if any). I affirm that Jonathan Gonzalez then provided consent for assistance with transportation.

## 2023-02-25 NOTE — Discharge Summary (Signed)
Advanced Heart Failure Team  Discharge Summary   Patient ID: Jonathan Gonzalez MRN: [id number], DOB/AGE: 1961/09/11 62 y.o. Admit date: 02/21/2023 D/C date:     02/26/2023   Primary Discharge Diagnoses:  Paroxysmal atrial fibrillation with RVR Acute on chronic systolic heart failure  Secondary Discharge Diagnoses:  DM II HLD CKD stage 3  Hospital Course:  Jonathan Gonzalez is a 62 y.o. male with history of HFrEF, atrial fibrillation (diagnosed 12/23), HTN, HLD, DM and systolic HF.   Patient with history of multiple DVVCs. Was previously seen in AHF clinic 5/24 and was in a fib RVR at the time, recommended to go to ED and never did. Later saw Dr. Lalla Brothers (EP) 5/30, still in a fib, attempted to uptitrate amio but he was later seen in AHF clinic 6/6 and EKG showed persistent a fib RVR. Sent to ED for DCCV and mild volume overload.   Once admitted he diuresed well with IV lasix.  Underwent successful DCCV 6/10 to NSR. Has f/u with EP in August for possible ablation +/- PPM.  Pt will continue to be followed closely in the HF clinic. Dr Gala Romney evaluated and deemed appropriate for discharge. Patient getting 30 day meds from [facility] pharmacy. Also provided AHF office number (where he won't lose it) so he can contact us if he needs medication assistance or has questions/ concerns. HFSW provided patient with Taxi voucher for home.   See below for detailed problem list: 1. Paroxysmal atrial fibrillation with RVR - Diagnosed 12/23 - Successful DCCV 3/24, repeat DC-CV 4/24  - Back in Afib/atrial tach with RVR 6/6 in AHF clinic - Transition amiodarone gtt to 200 mg PO BID - Continue Eliquis, he has not missed doses per his report since last TEE.  - S/p DCCV 02/25/23>> NSR - Would aim for eventual ablation, has f/u with EP in August.  2. Acute on chronic systolic HF - Suspect possibly tachy-mediated. Discovered in setting of Afib with RVR in December 2023. Echo 12/23: EF 45-50%, RV okay, severe LAE. No  prior echo for comparison. - Echo 03/24 in setting of Afib with RVR: EF 35-40% - TEE 4/24 EF 30% - appears euvolemic, 40 mg lasix daily PRN  - Continue jardiance 10 mg daily - Continue spironolactone 12.5 mg daily - Continue Entresto 24/26 bid  - strict I&O, daily weights 3. DM II -Last A1c 6.6% -PharmD worked with patient on home regimen 4. HLD - Continue Atorvastatin 5. CKD: Stage 3 - Baseline creatinine appears to be around 1.5.  - stable, sCr 1.23 today  Discharge Weight Range: 228 lbs Discharge Vitals: Blood pressure 137/77, pulse 77, temperature 98.2 F (36.8 C), temperature source Oral, resp. rate 14, height 5\' 8"  (1.727 m), weight 103.6 kg, SpO2 94 %.  Labs: Lab Results  Component Value Date   WBC 10.1 02/21/2023   HGB 13.4 02/21/2023   HCT 44.9 02/21/2023   MCV 75.5 (L) 02/21/2023   PLT 341 02/21/2023    Recent Labs  Lab 02/21/23 1546 02/22/23 0037 02/26/23 0850  NA 140   < > 133*  K 4.3   < > 4.1  CL 102   < > 97*  CO2 19*   < > 23  BUN 13   < > 21  CREATININE 1.35*   < > 1.23  CALCIUM 9.6   < > 9.0  PROT 6.5  --   --   BILITOT 0.5  --   --   ALKPHOS 48  --   --  ALT 19  --   --   AST 21  --   --   GLUCOSE 120*   < > 114*   < > = values in this interval not displayed.   Lab Results  Component Value Date   CHOL 123 09/15/2022   HDL 38 (L) 09/15/2022   LDLCALC 73 09/15/2022   TRIG 59 09/15/2022   BNP (last 3 results) Recent Labs    01/29/23 1003 02/21/23 1430 02/21/23 1546  BNP 70.1 82.2 75.9    ProBNP (last 3 results) No results for input(s): "PROBNP" in the last 8760 hours.   Diagnostic Studies/Procedures   EP STUDY  Result Date: 02/25/2023 See surgical note for result.  TEE/DCCV 6/10 >> NSR  Discharge Medications   Allergies as of 02/26/2023   No Known Allergies      Medication List     STOP taking these medications    metoprolol 200 MG 24 hr tablet Commonly known as: Toprol XL       TAKE these medications     amiodarone 200 MG tablet Commonly known as: PACERONE Take 1 tablet (200 mg total) by mouth 2 (two) times daily. What changed: See the new instructions.   apixaban 5 MG Tabs tablet Commonly known as: ELIQUIS Take 1 tablet (5 mg total) by mouth 2 (two) times daily.   atorvastatin 10 MG tablet Commonly known as: LIPITOR Take 1 tablet (10 mg total) by mouth daily.   empagliflozin 10 MG Tabs tablet Commonly known as: JARDIANCE Take 1 tablet (10 mg total) by mouth daily.   furosemide 40 MG tablet Commonly known as: Lasix Take 1 tablet (40 mg total) by mouth daily as needed. For 3 lb weight gain overnight or 5 lbs in one week   hydrOXYzine 25 MG capsule Commonly known as: VISTARIL Take 1-2 capsules (25-50 mg total) by mouth at bedtime. For sleep   iron polysaccharides 150 MG capsule Commonly known as: NIFEREX Take 150 mg by mouth daily.   metFORMIN 500 MG tablet Commonly known as: GLUCOPHAGE Take 1 tablet (500 mg total) by mouth 2 (two) times daily with a meal.   omeprazole 20 MG capsule Commonly known as: PRILOSEC Take 1 capsule (20 mg total) by mouth daily. FOR ACID REFLUX   sacubitril-valsartan 24-26 MG Commonly known as: ENTRESTO Take 1 tablet by mouth 2 (two) times daily.   spironolactone 25 MG tablet Commonly known as: ALDACTONE Take 0.5 tablets (12.5 mg total) by mouth daily.   True Metrix Blood Glucose Test test strip Generic drug: glucose blood Use as instructed. Check blood glucose level by fingerstick twice per day. NEEDS PASS   True Metrix Meter w/Device Kit Use as instructed. Check blood glucose level by fingerstick twice per day. E11.65        Disposition   The patient will be discharged in stable condition to home.   Follow-up Information     [facility] Follow up on 03/08/2023.   Specialty: Cardiology Why: Follow up in the Advanced Heart Failure Clinic 03/08/23 at 2:30 pm Entrance C, free  valet Contact information: [address] [id number] mc [place] [address] [phone]                  Duration of Discharge Encounter: Greater than 35 minutes   Signed, Alen Bleacher AGACNP-BC  02/26/2023, 11:10 AM

## 2023-02-26 ENCOUNTER — Other Ambulatory Visit (HOSPITAL_COMMUNITY): Payer: Self-pay

## 2023-02-26 ENCOUNTER — Encounter (HOSPITAL_COMMUNITY): Payer: Self-pay | Admitting: Internal Medicine

## 2023-02-26 DIAGNOSIS — I4891 Unspecified atrial fibrillation: Secondary | ICD-10-CM | POA: Diagnosis not present

## 2023-02-26 LAB — BASIC METABOLIC PANEL
Anion gap: 13 (ref 5–15)
BUN: 21 mg/dL (ref 8–23)
CO2: 23 mmol/L (ref 22–32)
Calcium: 9 mg/dL (ref 8.9–10.3)
Chloride: 97 mmol/L — ABNORMAL LOW (ref 98–111)
Creatinine, Ser: 1.23 mg/dL (ref 0.61–1.24)
GFR, Estimated: >60 mL/min (ref >60)
Glucose, Bld: 114 mg/dL — ABNORMAL HIGH (ref 70–99)
Potassium: 4.1 mmol/L (ref 3.5–5.1)
Sodium: 133 mmol/L — ABNORMAL LOW (ref 135–145)

## 2023-02-26 LAB — GLUCOSE, CAPILLARY
Glucose-Capillary: 94 mg/dL (ref 70–99)
Glucose-Capillary: 94 mg/dL (ref 70–99)

## 2023-02-26 MED ORDER — IPRATROPIUM-ALBUTEROL 0.5-2.5 (3) MG/3ML IN SOLN
RESPIRATORY_TRACT | Status: AC
  Filled 2023-02-26: qty 3

## 2023-02-26 MED ORDER — FUROSEMIDE 40 MG PO TABS
40.0000 mg | ORAL_TABLET | Freq: Once | ORAL | Status: AC
  Administered 2023-02-26: 40 mg via ORAL
  Filled 2023-02-26: qty 1

## 2023-02-26 MED ORDER — AMIODARONE HCL 200 MG PO TABS
200.0000 mg | ORAL_TABLET | Freq: Two times a day (BID) | ORAL | 5 refills | Status: DC
  Filled 2023-02-26: qty 60, 30d supply, fill #0

## 2023-02-26 MED ORDER — ATORVASTATIN CALCIUM 10 MG PO TABS
10.0000 mg | ORAL_TABLET | Freq: Every day | ORAL | 1 refills | Status: DC
  Filled 2023-02-26: qty 30, 30d supply, fill #0
  Filled 2023-08-01: qty 30, 30d supply, fill #1
  Filled 2023-11-21: qty 30, 30d supply, fill #2

## 2023-02-26 MED ORDER — METFORMIN HCL 500 MG PO TABS
500.0000 mg | ORAL_TABLET | Freq: Two times a day (BID) | ORAL | 4 refills | Status: DC
Start: 2023-02-26 — End: 2024-02-26
  Filled 2023-02-26: qty 60, 30d supply, fill #0
  Filled 2023-08-01: qty 60, 30d supply, fill #1
  Filled 2023-10-24: qty 60, 30d supply, fill #2
  Filled 2023-11-21: qty 60, 30d supply, fill #3

## 2023-02-26 MED ORDER — SACUBITRIL-VALSARTAN 24-26 MG PO TABS
1.0000 | ORAL_TABLET | Freq: Two times a day (BID) | ORAL | 3 refills | Status: DC
Start: 2023-02-26 — End: 2023-12-20
  Filled 2023-02-26 – 2023-04-05 (×2): qty 60, 30d supply, fill #0

## 2023-02-26 MED ORDER — APIXABAN 5 MG PO TABS
5.0000 mg | ORAL_TABLET | Freq: Two times a day (BID) | ORAL | 5 refills | Status: DC
Start: 2023-02-26 — End: 2024-03-18
  Filled 2023-02-26 – 2023-08-01 (×3): qty 60, 30d supply, fill #0
  Filled 2023-10-24: qty 60, 30d supply, fill #1
  Filled 2023-11-21: qty 60, 30d supply, fill #2
  Filled 2024-01-29: qty 60, 30d supply, fill #3

## 2023-02-26 MED ORDER — IPRATROPIUM-ALBUTEROL 0.5-2.5 (3) MG/3ML IN SOLN
3.0000 mL | Freq: Once | RESPIRATORY_TRACT | Status: AC
  Administered 2023-02-26: 3 mL via RESPIRATORY_TRACT

## 2023-02-26 MED ORDER — AMIODARONE HCL 200 MG PO TABS
200.0000 mg | ORAL_TABLET | Freq: Two times a day (BID) | ORAL | Status: DC
  Administered 2023-02-26: 200 mg via ORAL
  Filled 2023-02-26: qty 1

## 2023-02-26 MED ORDER — SPIRONOLACTONE 25 MG PO TABS
12.5000 mg | ORAL_TABLET | Freq: Every day | ORAL | 1 refills | Status: DC
  Filled 2023-02-26: qty 15, 30d supply, fill #0

## 2023-02-26 MED ORDER — FUROSEMIDE 40 MG PO TABS
40.0000 mg | ORAL_TABLET | Freq: Every day | ORAL | 1 refills | Status: DC | PRN
  Filled 2023-02-26: qty 30, 30d supply, fill #0
  Filled 2023-11-21: qty 30, 30d supply, fill #1

## 2023-02-26 NOTE — TOC Transition Note (Signed)
Transition of Care [facility]) - CM/SW Discharge Note   Patient Details  Name: Jonathan Gonzalez MRN: [id number] Date of Birth: 1960-10-18  Transition of Care [facility]) CM/SW Contact:  Tom-Johnson, Hershal Coria, RN Phone Number: 02/26/2023, 12:30 PM   Clinical Narrative:     Patient is scheduled for discharge today.  Readmission Risk Assessment done. Outpatient referral, hospital f/u and discharge instructions on AVS. Prescriptions sent to [facility] pharmacy and meds will be delivered to patient at bedside prior discharge. Cab voucher given to patient by previous CM yesterday. No further TOC needs noted.        Final next level of care: Home/Self Care Barriers to Discharge: Barriers Resolved   Patient Goals and CMS Choice CMS Medicare.gov Compare Post Acute Care list provided to:: Patient Choice offered to / list presented to : Patient  Discharge Placement                  Patient to be transferred to facility by: [facility]      Discharge Plan and Services Additional resources added to the After Visit Summary for     Discharge Planning Services: CM Consult            DME Arranged: N/A DME Agency: NA         HH Agency: NA        Social Determinants of Health (SDOH) Interventions SDOH Screenings   Food Insecurity: No Food Insecurity (12/05/2022)  Housing: Medium Risk (01/04/2023)  Transportation Needs: Unmet Transportation Needs (01/04/2023)  Utilities: Not At Risk (12/04/2022)  Alcohol Screen: Low Risk  (12/05/2022)  Depression (PHQ2-9): Medium Risk (12/28/2022)  Financial Resource Strain: Medium Risk (12/05/2022)  Tobacco Use: Low Risk  (02/26/2023)     Readmission Risk Interventions    02/26/2023   12:29 PM  Readmission Risk Prevention Plan  Post Dischage Appt Complete  Medication Screening Complete  Transportation Screening Complete

## 2023-02-26 NOTE — Progress Notes (Addendum)
Patient ID: CHASKA HAGGER, male   DOB: December 05, 1960, 62 y.o.   MRN: [id number]     Advanced Heart Failure Rounding Note  PCP-Cardiologist: Donato Schultz, MD   Subjective:   S/p DCCV yesterday>> NSR. Remains on amiodarone gtt.   Asking to go home today. Denies CP/SOB.   Objective:   Weight Range: 103.6 kg Body mass index is 34.73 kg/m.   Vital Signs:   Temp:  [97.8 F (36.6 C)-98.3 F (36.8 C)] 97.9 F (36.6 C) (06/11 0348) Pulse Rate:  [75-126] 75 (06/11 0348) Resp:  [13-22] 17 (06/11 0348) BP: (76-150)/(65-100) 111/71 (06/11 0348) SpO2:  [93 %-100 %] 96 % (06/11 0348) Weight:  [103.6 kg] 103.6 kg (06/11 0406) Last BM Date : 02/23/23  Weight change: Filed Weights   02/24/23 0428 02/25/23 0607 02/26/23 0406  Weight: 104.3 kg 104.2 kg 103.6 kg    Intake/Output:   Intake/Output Summary (Last 24 hours) at 02/26/2023 0728 Last data filed at 02/26/2023 4098 Gross per 24 hour  Intake 730.74 ml  Output 1270 ml  Net -539.26 ml    Physical Exam  General:  well appearing.  No respiratory difficulty HEENT: normal Neck: supple. JVD ~7 cm. Carotids 2+ bilat; no bruits. No lymphadenopathy or thyromegaly appreciated. Cor: PMI nondisplaced. Regular rate & rhythm. No rubs, gallops or murmurs. Lungs: clear Abdomen: soft, nontender, nondistended. No hepatosplenomegaly. No bruits or masses. Good bowel sounds. Extremities: no cyanosis, clubbing, rash, edema  Neuro: alert & oriented x 3, cranial nerves grossly intact. moves all 4 extremities w/o difficulty. Affect pleasant.  Telemetry  NSR 80s (Personally reviewed)   Labs    CBC No results for input(s): "WBC", "NEUTROABS", "HGB", "HCT", "MCV", "PLT" in the last 72 hours. Basic Metabolic Panel Recent Labs    11/91/47 0017  NA 128*  K 3.3*  CL 90*  CO2 21*  GLUCOSE 427*  BUN 20  CREATININE 1.42*  CALCIUM 7.9*   Liver Function Tests No results for input(s): "AST", "ALT", "ALKPHOS", "BILITOT", "PROT", "ALBUMIN" in the last  72 hours. No results for input(s): "LIPASE", "AMYLASE" in the last 72 hours. Cardiac Enzymes No results for input(s): "CKTOTAL", "CKMB", "CKMBINDEX", "TROPONINI" in the last 72 hours.  BNP: BNP (last 3 results) Recent Labs    01/29/23 1003 02/21/23 1430 02/21/23 1546  BNP 70.1 82.2 75.9   No results for input(s): "TSH", "T4TOTAL", "T3FREE", "THYROIDAB" in the last 72 hours.  Invalid input(s): "FREET3"  Other results:   Imaging    EP STUDY  Result Date: 02/25/2023 See surgical note for result.    Medications:     Scheduled Medications:  apixaban  5 mg Oral BID   atorvastatin  10 mg Oral Daily   empagliflozin  10 mg Oral Daily   guaiFENesin  600 mg Oral BID   insulin aspart  0-15 Units Subcutaneous TID WC   insulin aspart  0-5 Units Subcutaneous QHS   pantoprazole  40 mg Oral Daily   sacubitril-valsartan  1 tablet Oral BID   sodium chloride flush  3 mL Intravenous Q12H   spironolactone  12.5 mg Oral Daily    Infusions:  sodium chloride     amiodarone 60 mg/hr (02/26/23 0628)    PRN Medications: sodium chloride, acetaminophen, guaiFENesin-dextromethorphan, ondansetron (ZOFRAN) IV, sodium chloride flush  Patient Profile   62 y.o. male with history of HFrEF, atrial fibrillation (diagnosed 12/23), HTN, HLD, DM and systolic HF. Direct admitted yesterday from AHF clinic with a fib RVR.   Assessment/Plan  1. Paroxysmal atrial fibrillation with RVR - Diagnosed 12/23 - Successful DCCV 3/24, repeat DC-CV 4/24  - Back in Afib/atrial tach with RVR 6/6 in AHF clinic - Transition amiodarone gtt to 200 mg PO BID - Continue Eliquis, he has not missed doses per his report since last TEE.  - S/p DCCV yesterday>> NSR - Would aim for eventual ablation.   2. Acute on chronic systolic HF - Suspect possibly tachy-mediated. Discovered in setting of Afib with RVR in December 2023. Echo 12/23: EF 45-50%, RV okay, severe LAE. No prior echo for comparison. - Echo 03/24 in  setting of Afib with RVR: EF 35-40% - TEE 4/24 EF 30% - appears euvolemic, will stop 40 IV lasix BID after morning dose and transition to 40 mg lasix daily PO - Continue jardiance 10 mg daily - Continue spironolactone 12.5 mg daily - Continue Entresto 24/26 bid  - strict I&O, daily weights   3. DM II -Last A1c 6.6% -SSI   4. HLD - Continue Atorvastatin  5. CKD: Stage 3 - Baseline creatinine appears to be around 1.5.  - stable, sCr 1.42, pending today  Patient in NSR, wanting to go home today. Stable. Will plan for discharge later today, discussed calling AHF clinic if meds are running low or has any questions. Has f/u in AHF clinic and with EP. Patient will be given 30 days of meds at discharge.   AHF meds at discharge:  Amiodarone 200 mg BID Eliquis 5 mg BID Atorvastatin 10 mg daily Jardiance 10 mg daily Entresto 24-26 mg BID Spironolactone 12.5 mg daily Lasix 40 mg daily PRN  Length of Stay: 5  Alen Bleacher, NP  02/26/2023, 7:28 AM  Advanced Heart Failure Team Pager [phone] (M-F; 7a - 5p)  Please contact [facility] for night-coverage after hours (5p -7a ) and weekends on [email or web address]  Patient seen and examined with the above-signed Advanced Practice Provider and/or Housestaff. I personally reviewed laboratory data, imaging studies and relevant notes. I independently examined the patient and formulated the important aspects of the plan. I have edited the note to reflect any of my changes or salient points. I have personally discussed the plan with the patient and/or family.  Remains in NSR after DC-CV today. Volume status improved as well. No CP or SOB. Wants to go home  General:  Well appearing. No resp difficulty HEENT: normal Neck: supple. no JVD. Carotids 2+ bilat; no bruits. No lymphadenopathy or thryomegaly appreciated. Cor: PMI nondisplaced. Regular rate & rhythm. No rubs, gallops or murmurs. Lungs: clear Abdomen: soft, nontender, nondistended. No  hepatosplenomegaly. No bruits or masses. Good bowel sounds. Extremities: no cyanosis, clubbing, rash, edema Neuro: alert & orientedx3, cranial nerves grossly intact. moves all 4 extremities w/o difficulty. Affect pleasant  Ok for d/c today on above meds. Will arrange HF f/u for next week.   Arvilla Meres, MD  8:12 AM

## 2023-03-08 ENCOUNTER — Ambulatory Visit: Payer: 59 | Admitting: Gastroenterology

## 2023-03-08 ENCOUNTER — Encounter (HOSPITAL_COMMUNITY): Payer: 59

## 2023-03-18 ENCOUNTER — Encounter (HOSPITAL_COMMUNITY): Payer: 59

## 2023-04-01 ENCOUNTER — Encounter: Payer: 59 | Admitting: Nurse Practitioner

## 2023-04-05 ENCOUNTER — Other Ambulatory Visit (HOSPITAL_COMMUNITY): Payer: Self-pay

## 2023-04-05 ENCOUNTER — Other Ambulatory Visit: Payer: Self-pay

## 2023-04-07 ENCOUNTER — Other Ambulatory Visit (HOSPITAL_COMMUNITY): Payer: Self-pay

## 2023-04-12 ENCOUNTER — Other Ambulatory Visit: Payer: Self-pay

## 2023-05-13 NOTE — Progress Notes (Deleted)
Cardiology Office Note Date:  05/13/2023  Patient ID:  Jonathan Gonzalez 1961/08/13, MRN [id number] PCP:  Claiborne Rigg, NP  Cardiologist:  Dr. Gala Romney Electrophysiologist: Dr. Lalla Brothers  ***refresh   Chief Complaint: *** 3 mo  History of Present Illness: Jonathan Gonzalez is a 62 y.o. male with history of HTN, HLD, DM, chronic CHF (systolic), NICM (suspect to be tachy-mediated), AFib, CKD (III)  Saw Dr. Lalla Brothers 02/14/23, had been out of his amiodarone, uncertain how long, a couple weeks probably, he was also unclear on most of his medicines.  Advised him take 200 g by mouth twice daily for 2 weeks and then decrease the dose to 200 mg by mouth once daily.   She saw the HF team 02/21/23, back in AF w/RVR out of several meds dur to cost.  Reported feeling OK, arthritis L hand, using BC powder.  Planned for admission, restarting meds, DCCV, and volume management. Discharged 02/26/23 Urged to reach out of med assistance is needed  *** eliquis, bleeding, labs, dose *** amiodarone, labs *** med compliance *** volume   Device information Diagnosed Dec 2023 Amiodarone started April 2024   Past Medical History:  Diagnosis Date   Diabetes mellitus without complication (HCC)    GERD (gastroesophageal reflux disease)    Hyperlipidemia    Hypertension     Past Surgical History:  Procedure Laterality Date   BUBBLE STUDY  12/06/2022   Procedure: BUBBLE STUDY;  Surgeon: Parke Poisson, MD;  Location: [facility] ENDOSCOPY;  Service: Cardiovascular;;   CARDIOVERSION N/A 12/06/2022   Procedure: CARDIOVERSION;  Surgeon: Parke Poisson, MD;  Location[facility] ENDOSCOPY;  Service: Cardiovascular;  Laterality: N/A;   CARDIOVERSION N/A 01/15/2023   Procedure: CARDIOVERSION;  Surgeon: Dolores Patty, MD;  Location: [facility] INVASIVE CV LAB;  Service: Cardiovascular;  Laterality: N/A;   CARDIOVERSION N/A 02/25/2023   Procedure: CARDIOVERSION;  Surgeon: Dolores Patty, MD;  Location: [facility]  INVASIVE CV LAB;  Service: Cardiovascular;  Laterality: N/A;   HERNIA REPAIR     TEE WITHOUT CARDIOVERSION N/A 12/06/2022   Procedure: TRANSESOPHAGEAL ECHOCARDIOGRAM (TEE);  Surgeon: Parke Poisson, MD;  Location: [facility] ENDOSCOPY;  Service: Cardiovascular;  Laterality: N/A;   TEE WITHOUT CARDIOVERSION N/A 01/15/2023   Procedure: TRANSESOPHAGEAL ECHOCARDIOGRAM;  Surgeon: Dolores Patty, MD;  Location: [facility] INVASIVE CV LAB;  Service: Cardiovascular;  Laterality: N/A;    Current Outpatient Medications  Medication Sig Dispense Refill   amiodarone (PACERONE) 200 MG tablet Take 1 tablet (200 mg total) by mouth 2 (two) times daily. 60 tablet 5   apixaban (ELIQUIS) 5 MG TABS tablet Take 1 tablet (5 mg total) by mouth 2 (two) times daily. 60 tablet 5   atorvastatin (LIPITOR) 10 MG tablet Take 1 tablet (10 mg total) by mouth daily. 90 tablet 1   Blood Glucose Monitoring Suppl (TRUE METRIX METER) w/Device KIT Use as instructed. Check blood glucose level by fingerstick twice per day. E11.65 1 kit 0   empagliflozin (JARDIANCE) 10 MG TABS tablet Take 1 tablet (10 mg total) by mouth daily. 90 tablet 3   furosemide (LASIX) 40 MG tablet Take 1 tablet (40 mg total) by mouth daily as needed. For 3 lb weight gain overnight or 5 lbs in one week 90 tablet 1   glucose blood (TRUE METRIX BLOOD GLUCOSE TEST) test strip Use as instructed. Check blood glucose level by fingerstick twice per day. NEEDS PASS 100 each 12   hydrOXYzine (VISTARIL) 25 MG capsule  Take 1-2 capsules (25-50 mg total) by mouth at bedtime. For sleep 60 capsule 3   iron polysaccharides (NIFEREX) 150 MG capsule Take 150 mg by mouth daily. (Patient not taking: Reported on 02/21/2023)     metFORMIN (GLUCOPHAGE) 500 MG tablet Take 1 tablet (500 mg total) by mouth 2 (two) times daily with a meal. 60 tablet 4   omeprazole (PRILOSEC) 20 MG capsule Take 1 capsule (20 mg total) by mouth daily. FOR ACID REFLUX 90 capsule 1   sacubitril-valsartan (ENTRESTO) 24-26  MG Take 1 tablet by mouth 2 (two) times daily. 60 tablet 3   spironolactone (ALDACTONE) 25 MG tablet Take 0.5 tablets (12.5 mg total) by mouth daily. 15 tablet 1   No current facility-administered medications for this visit.    Allergies:   Patient has no known allergies.   Social History:  The patient  reports that he has never smoked. He has never used smokeless tobacco. He reports that he does not drink alcohol and does not use drugs.   Family History:  The patient's family history is not on file.  ROS:  Please see the history of present illness.    All other systems are reviewed and otherwise negative.   PHYSICAL EXAM:  VS:  There were no vitals taken for this visit. BMI: There is no height or weight on file to calculate BMI. Well nourished, well developed, in no acute distress HEENT: normocephalic, atraumatic Neck: no JVD, carotid bruits or masses Cardiac:  *** RRR; no significant murmurs, no rubs, or gallops Lungs:  *** CTA b/l, no wheezing, rhonchi or rales Abd: soft, nontender MS: no deformity or *** atrophy Ext: *** no edema Skin: warm and dry, no rash Neuro:  No gross deficits appreciated Psych: euthymic mood, full affect    EKG:  Done today and reviewed by myself shows       01/14/22: TEE  1. LVEF hard to assess in setting of rapid AF. Left ventricular ejection  fraction, by estimation, is 30%. The left ventricle has moderately  decreased function.   2. Right ventricular systolic function is moderately reduced. The right  ventricular size is normal.   3. Left atrial size was severely dilated. No left atrial/left atrial  appendage thrombus was detected.   4. Right atrial size was mildly dilated.   5. The mitral valve is normal in structure. Mild mitral valve  regurgitation.   6. The aortic valve is tricuspid. Aortic valve regurgitation is not  visualized.   7. There is mild (Grade II) plaque involving the descending aorta.   8. Evidence of atrial level  shunting detected by color flow Doppler.  There is a small atrial septal defect.    Recent Labs: 02/21/2023: ALT 19; B Natriuretic Peptide 75.9; Hemoglobin 13.4; Magnesium 2.1; Platelets 341; TSH 4.380 02/26/2023: BUN 21; Creatinine, Ser 1.23; Potassium 4.1; Sodium 133  09/15/2022: Cholesterol 123; HDL 38; LDL Cholesterol 73; Total CHOL/HDL Ratio 3.2; Triglycerides 59; VLDL 12   CrCl cannot be calculated (Patient's most recent lab result is older than the maximum 21 days allowed.).   Wt Readings from Last 3 Encounters:  02/26/23 228 lb 6.3 oz (103.6 kg)  02/21/23 238 lb (108 kg)  02/14/23 238 lb (108 kg)     Other studies reviewed: Additional studies/records reviewed today include: summarized above  ASSESSMENT AND PLAN:  Paroxysmal AFib CHA2DS2Vasc is 3, on Eliquis, *** appropriately dosed *** amiodarone *** burden by symptoms  Chronic CHF (systolic) NICM Suspect tachy-mediated ***  HTN ***  Secondary hypercoagulable state     Disposition: F/u with ***  Current medicines are reviewed at length with the patient today.  The patient did not have any concerns regarding medicines.  Norma Fredrickson, PA-C 05/13/2023 2:22 PM     [facility] [address] [place] [address] [phone] (office)  [phone] (fax)

## 2023-05-15 ENCOUNTER — Ambulatory Visit: Payer: 59 | Attending: Physician Assistant | Admitting: Physician Assistant

## 2023-06-21 ENCOUNTER — Other Ambulatory Visit: Payer: Self-pay | Admitting: Nurse Practitioner

## 2023-06-21 DIAGNOSIS — Z1211 Encounter for screening for malignant neoplasm of colon: Secondary | ICD-10-CM

## 2023-06-21 DIAGNOSIS — Z1212 Encounter for screening for malignant neoplasm of rectum: Secondary | ICD-10-CM

## 2023-06-29 LAB — COLOGUARD

## 2023-07-01 ENCOUNTER — Other Ambulatory Visit: Payer: Self-pay | Admitting: Nurse Practitioner

## 2023-07-01 DIAGNOSIS — Z1211 Encounter for screening for malignant neoplasm of colon: Secondary | ICD-10-CM

## 2023-07-27 LAB — COLOGUARD

## 2023-08-01 ENCOUNTER — Other Ambulatory Visit: Payer: Self-pay

## 2023-08-21 ENCOUNTER — Ambulatory Visit: Payer: 59 | Admitting: Nurse Practitioner

## 2023-08-21 ENCOUNTER — Ambulatory Visit: Payer: 59 | Admitting: Physician Assistant

## 2023-08-21 ENCOUNTER — Emergency Department (HOSPITAL_COMMUNITY)
Admission: EM | Admit: 2023-08-21 | Discharge: 2023-08-21 | Disposition: A | Discharge status: Home / Self Care | Payer: 59 | Attending: Emergency Medicine | Admitting: Emergency Medicine

## 2023-08-21 ENCOUNTER — Other Ambulatory Visit: Payer: Self-pay

## 2023-08-21 ENCOUNTER — Emergency Department (HOSPITAL_COMMUNITY): Payer: 59

## 2023-08-21 DIAGNOSIS — I509 Heart failure, unspecified: Secondary | ICD-10-CM | POA: Insufficient documentation

## 2023-08-21 DIAGNOSIS — I110 Hypertensive heart disease with heart failure: Secondary | ICD-10-CM | POA: Diagnosis not present

## 2023-08-21 DIAGNOSIS — Z7901 Long term (current) use of anticoagulants: Secondary | ICD-10-CM | POA: Insufficient documentation

## 2023-08-21 DIAGNOSIS — R0602 Shortness of breath: Secondary | ICD-10-CM | POA: Diagnosis not present

## 2023-08-21 DIAGNOSIS — E119 Type 2 diabetes mellitus without complications: Secondary | ICD-10-CM | POA: Diagnosis not present

## 2023-08-21 DIAGNOSIS — R062 Wheezing: Secondary | ICD-10-CM | POA: Insufficient documentation

## 2023-08-21 DIAGNOSIS — Z7984 Long term (current) use of oral hypoglycemic drugs: Secondary | ICD-10-CM | POA: Insufficient documentation

## 2023-08-21 HISTORY — DX: Unspecified atrial fibrillation: I48.91

## 2023-08-21 LAB — BASIC METABOLIC PANEL
Anion gap: 11 (ref 5–15)
BUN: 12 mg/dL (ref 8–23)
CO2: 21 mmol/L — ABNORMAL LOW (ref 22–32)
Calcium: 9 mg/dL (ref 8.9–10.3)
Chloride: 106 mmol/L (ref 98–111)
Creatinine, Ser: 0.82 mg/dL (ref 0.61–1.24)
GFR, Estimated: >60 mL/min (ref >60)
Glucose, Bld: 109 mg/dL — ABNORMAL HIGH (ref 70–99)
Potassium: 3.5 mmol/L (ref 3.5–5.1)
Sodium: 138 mmol/L (ref 135–145)

## 2023-08-21 LAB — CBC
HCT: 42 % (ref 39.0–52.0)
Hemoglobin: 12.7 g/dL — ABNORMAL LOW (ref 13.0–17.0)
MCH: 23.2 pg — ABNORMAL LOW (ref 26.0–34.0)
MCHC: 30.2 g/dL (ref 30.0–36.0)
MCV: 76.8 fL — ABNORMAL LOW (ref 80.0–100.0)
Platelets: 324 10*3/uL (ref 150–400)
RBC: 5.47 MIL/uL (ref 4.22–5.81)
RDW: 18.6 % — ABNORMAL HIGH (ref 11.5–15.5)
WBC: 10.7 10*3/uL — ABNORMAL HIGH (ref 4.0–10.5)
nRBC: 0 % (ref 0.0–0.2)

## 2023-08-21 LAB — MAGNESIUM: Magnesium: 1.9 mg/dL (ref 1.7–2.4)

## 2023-08-21 LAB — BRAIN NATRIURETIC PEPTIDE: B Natriuretic Peptide: 15.5 pg/mL (ref 0.0–100.0)

## 2023-08-21 LAB — D-DIMER, QUANTITATIVE: D-Dimer, Quant: <0.27 ug{FEU}/mL (ref 0.00–0.50)

## 2023-08-21 LAB — TROPONIN I (HIGH SENSITIVITY): Troponin I (High Sensitivity): 12 ng/L (ref <18)

## 2023-08-21 MED ORDER — METHYLPREDNISOLONE SODIUM SUCC 125 MG IJ SOLR
125.0000 mg | Freq: Once | INTRAMUSCULAR | Status: AC
  Administered 2023-08-21: 125 mg via INTRAVENOUS
  Filled 2023-08-21: qty 2

## 2023-08-21 MED ORDER — ALBUTEROL SULFATE HFA 108 (90 BASE) MCG/ACT IN AERS
1.0000 | INHALATION_SPRAY | Freq: Four times a day (QID) | RESPIRATORY_TRACT | 2 refills | Status: AC | PRN
  Filled 2023-08-21: qty 18, 25d supply, fill #0
  Filled 2023-11-21: qty 18, 25d supply, fill #1

## 2023-08-21 MED ORDER — SODIUM CHLORIDE 0.9 % IV BOLUS
500.0000 mL | Freq: Once | INTRAVENOUS | Status: AC
  Administered 2023-08-21: 500 mL via INTRAVENOUS

## 2023-08-21 MED ORDER — SODIUM CHLORIDE 0.9 % IV BOLUS: 1000.0000 mL | Freq: Once | INTRAVENOUS | Status: DC

## 2023-08-21 MED ORDER — AZITHROMYCIN 250 MG PO TABS
500.0000 mg | ORAL_TABLET | Freq: Once | ORAL | Status: AC
  Administered 2023-08-21: 500 mg via ORAL
  Filled 2023-08-21: qty 2

## 2023-08-21 MED ORDER — IPRATROPIUM-ALBUTEROL 0.5-2.5 (3) MG/3ML IN SOLN
3.0000 mL | Freq: Once | RESPIRATORY_TRACT | Status: AC
  Administered 2023-08-21: 3 mL via RESPIRATORY_TRACT
  Filled 2023-08-21: qty 3

## 2023-08-21 MED ORDER — PREDNISONE 10 MG PO TABS
20.0000 mg | ORAL_TABLET | Freq: Every day | ORAL | 0 refills | Status: DC
  Filled 2023-08-21: qty 15, 7d supply, fill #0

## 2023-08-21 MED ORDER — AZITHROMYCIN 250 MG PO TABS
ORAL_TABLET | ORAL | 0 refills | Status: AC
  Filled 2023-08-21: qty 6, 5d supply, fill #0

## 2023-08-21 NOTE — ED Notes (Signed)
Patient transported to X-ray 

## 2023-08-21 NOTE — ED Notes (Signed)
Ambulated patient on pulse ox . Patient's O2 was 98% on room air. He did state that he feels better since his nebulizer treatment. He was still wheezing, but no other complaints noted.

## 2023-08-21 NOTE — Discharge Instructions (Addendum)
Evaluation today revealed that you may have had a mild asthma attack.  Also since you have had a cough intermittently over the send you home with a short course of antibiotics and steroids.  Recommend you follow-up with your PCP.  If your symptoms worsen in any way please return to the emergency department further evaluation.

## 2023-08-21 NOTE — ED Triage Notes (Signed)
Pt. Stated, Ive had SOB since Nov. 13 and I had a Dr's appt and it got cancelled. I just want to make sure Im not in  A-Fib.

## 2023-08-21 NOTE — ED Provider Triage Note (Signed)
Emergency Medicine Provider Triage Evaluation Note  Jonathan Gonzalez , a 62 y.o. male  was evaluated in triage.  Pt complains of feeling his heart is racing.  Symptoms been off and on for at least a couple weeks.  He has a history of atrial fibrillation and was worried that he had gone back into it.  I tried to schedule an appointment with his doctor but have been struggling, sounds like he is scheduled for Monday.  He denies cough congestion or fever.  Denies chest pain or pressure.  Does get short of breath with exertion..  Review of Systems  Positive: SOB with exertion, palpitations Negative: Chest pain, leg edema  Physical Exam  BP (!) 160/104   Pulse (!) 124   Temp 97.7 F (36.5 C) (Oral)   Resp 18   Ht 5\' 8"  (1.727 m)   Wt 104.3 kg   SpO2 98%   BMI 34.97 kg/m  Gen:   Awake, no distress   Resp:  Normal effort  MSK:   Moves extremities without difficulty  Other:  HR low 100's regular  Medical Decision Making  Medically screening exam initiated at 10:07 AM.  Appropriate orders placed.  ANGELICA STABLEIN was informed that the remainder of the evaluation will be completed by another provider, this initial triage assessment does not replace that evaluation, and the importance of remaining in the ED until their evaluation is complete.  Patient with a history of A-fib, initial EKG not obviously in A-fib more consistent with sinus tachycardia.  Patient does not endorse any fevers he thinks he could be anxious.  Will obtain a laboratory evaluation.   Melene Plan, DO 08/21/23 1008

## 2023-08-21 NOTE — ED Provider Notes (Signed)
[place] EMERGENCY DEPARTMENT AT [facility] Provider Note   CSN: [id number] Arrival date & time: 08/21/23  4098     History  Chief Complaint  Patient presents with   Shortness of Breath   [name] is a 62 y.o. male with history of atrial fibrillation, CHF, hypertension, HLD and diabetes presenting for shortness of breath.  States it started November 13.  Also endorses intermittent central chest pain but no chest pain today.  Shortness of breath is worse with exertion and lying flat.  Concerned he might be in A-fib.  Denies cough and fever.  Denies nausea vomiting diarrhea.   Shortness of Breath      Home Medications Prior to Admission medications   Medication Sig Start Date End Date Taking? Authorizing Provider  albuterol (VENTOLIN HFA) 108 (90 Base) MCG/ACT inhaler Inhale 1-2 puffs into the lungs every 6 (six) hours as needed for wheezing or shortness of breath. 08/21/23  Yes Gareth Eagle, PA-C  apixaban (ELIQUIS) 5 MG TABS tablet Take 1 tablet (5 mg total) by mouth 2 (two) times daily. 02/26/23  Yes Bensimhon, Bevelyn Buckles, MD  Aspirin-Salicylamide-Caffeine [facility] HEADACHE POWDER PO) Take 1 packet by mouth daily as needed (headache, pain).   Yes [provider]  atorvastatin (LIPITOR) 10 MG tablet Take 1 tablet (10 mg total) by mouth daily. 02/26/23  Yes Bensimhon, Bevelyn Buckles, MD  azithromycin (ZITHROMAX) 250 MG tablet Take 1 tablet (250 mg total) by mouth daily. Take first 2 tablets together, then 1 every day until finished. 08/21/23  Yes Gareth Eagle, PA-C  empagliflozin (JARDIANCE) 10 MG TABS tablet Take 1 tablet (10 mg total) by mouth daily. 02/14/23  Yes Lanier Prude, MD  furosemide (LASIX) 40 MG tablet Take 1 tablet (40 mg total) by mouth daily as needed. For 3 lb weight gain overnight or 5 lbs in one week 02/26/23  Yes Bensimhon, Bevelyn Buckles, MD  metFORMIN (GLUCOPHAGE) 500 MG tablet Take 1 tablet (500 mg total) by mouth 2 (two) times daily with  a meal. 02/26/23  Yes Bensimhon, Bevelyn Buckles, MD  omeprazole (PRILOSEC) 20 MG capsule Take 1 capsule (20 mg total) by mouth daily. FOR ACID REFLUX Patient taking differently: Take 20 mg by mouth daily as needed (acid reflux). 12/28/22  Yes Claiborne Rigg, NP  predniSONE (DELTASONE) 10 MG tablet Take 2 tablets (20 mg total) by mouth daily. 08/21/23  Yes Gareth Eagle, PA-C  amiodarone (PACERONE) 200 MG tablet Take 1 tablet (200 mg total) by mouth 2 (two) times daily. Patient not taking: Reported on 08/21/2023 02/26/23   Alen Bleacher, NP  sacubitril-valsartan (ENTRESTO) 24-26 MG Take 1 tablet by mouth 2 (two) times daily. Patient not taking: Reported on 08/21/2023 02/26/23   Bensimhon, Bevelyn Buckles, MD      Allergies    Patient has no known allergies.    Review of Systems   Review of Systems  Respiratory:  Positive for shortness of breath.     Physical Exam Updated Vital Signs BP (!) 150/93   Pulse (!) 114   Temp 98.2 F (36.8 C)   Resp (!) 24   Ht 5\' 8"  (1.727 m)   Wt 104.3 kg   SpO2 100%   BMI 34.97 kg/m  Physical Exam Vitals and nursing note reviewed.  HENT:     Head: Normocephalic and atraumatic.     Mouth/Throat:     Mouth: Mucous membranes are moist.  Eyes:  General:        Right eye: No discharge.        Left eye: No discharge.     Conjunctiva/sclera: Conjunctivae normal.  Cardiovascular:     Rate and Rhythm: Normal rate and regular rhythm.     Pulses: Normal pulses.     Heart sounds: Normal heart sounds.  Pulmonary:     Effort: Pulmonary effort is normal.     Breath sounds: Examination of the right-lower field reveals wheezing. Examination of the left-lower field reveals wheezing. Wheezing present. No rhonchi or rales.  Abdominal:     General: Abdomen is flat.     Palpations: Abdomen is soft.  Skin:    General: Skin is warm and dry.  Neurological:     General: No focal deficit present.  Psychiatric:        Mood and Affect: Mood normal.     ED Results /  Procedures / Treatments   Labs (all labs ordered are listed, but only abnormal results are displayed) Labs Reviewed  BASIC METABOLIC PANEL - Abnormal; Notable for the following components:      Result Value   CO2 21 (*)    Glucose, Bld 109 (*)    All other components within normal limits  CBC - Abnormal; Notable for the following components:   WBC 10.7 (*)    Hemoglobin 12.7 (*)    MCV 76.8 (*)    MCH 23.2 (*)    RDW 18.6 (*)    All other components within normal limits  MAGNESIUM  D-DIMER, QUANTITATIVE  BRAIN NATRIURETIC PEPTIDE  TROPONIN I (HIGH SENSITIVITY)    EKG EKG Interpretation Date/Time:  Wednesday August 21 2023 09:35:06 EST Ventricular Rate:  124 PR Interval:  138 QRS Duration:  68 QT Interval:  304 QTC Calculation: 436 R Axis:   38  Text Interpretation: Sinus tachycardia Otherwise normal ECG Otherwise no significant change Confirmed by Melene Plan [phone]) on 08/21/2023 10:02:00 AM  Radiology DG Chest 2 View  Result Date: 08/21/2023 CLINICAL DATA:  Shortness of breath. EXAM: CHEST - 2 VIEW COMPARISON:  12/04/2022; 12/03/2022 FINDINGS: Unchanged cardiac silhouette and mediastinal contours with obscuration of the bilateral heart borders secondary to elevated bilateral hemidiaphragms, left-greater-than-right. No focal airspace opacities. No pleural effusion or pneumothorax. No evidence of edema. No acute osseous abnormalities. IMPRESSION: No acute cardiopulmonary disease. Specifically, no evidence of pneumonia. Electronically Signed   By: Simonne Come M.D.   On: 08/21/2023 10:38    Procedures Procedures    Medications Ordered in ED Medications  azithromycin (ZITHROMAX) tablet 500 mg (has no administration in time range)  sodium chloride 0.9 % bolus 500 mL (0 mLs Intravenous Stopped 08/21/23 1152)  ipratropium-albuterol (DUONEB) 0.5-2.5 (3) MG/3ML nebulizer solution 3 mL (3 mLs Nebulization Given 08/21/23 1135)  methylPREDNISolone sodium succinate (SOLU-MEDROL) 125  mg/2 mL injection 125 mg (125 mg Intravenous Given 08/21/23 1132)  ipratropium-albuterol (DUONEB) 0.5-2.5 (3) MG/3ML nebulizer solution 3 mL (3 mLs Nebulization Given 08/21/23 1131)    ED Course/ Medical Decision Making/ A&P Clinical Course as of 08/21/23 1410  Wed Aug 21, 2023  1227 CO2(!): 21 [JR]  1228 [facility](!): 23.2 [JR]    Clinical Course User Index [JR] Gareth Eagle, PA-C                                 Medical Decision Making Amount and/or Complexity of Data Reviewed Labs: ordered. Decision-making details documented  in ED Course. Radiology: ordered.  Risk Prescription drug management.   Initial Impression and Ddx 62 year old well-appearing male presenting for shortness of breath.  Exam notable for wheezing in the lower lung fields but otherwise reassuring.  Risks includes COPD or asthma exacerbation, pneumonia, PE, ACS, arrhythmia or other. Patient PMH that increases complexity of ED encounter: atrial fibrillation, CHF, hypertension, HLD and diabetes  Interpretation of Diagnostics - I independent reviewed and interpreted the labs as followed: Leukocytosis  - I independently visualized the following imaging with scope of interpretation limited to determining acute life threatening conditions related to emergency care: cxr, which revealed no acute cardiopulmonary process  -I personally reviewed interpret EKG which revealed sinus tachycardia  Patient Reassessment and Ultimate Disposition/Management On reassessment after breathing treatment and fluids patient stated shortness of breath had improved considerably and his tachycardia did improve.  It did remain somewhat elevated but suspect this is more due to albuterol treatment.  Doubt ACS given no chest pain and reassuring EKG and troponins.  Also doubt PE given negative D-dimer and no chest pain.  Doubt arrhythmia including A-fib given reassuring EKG.  Suspect mild asthma exacerbation. Work up does not suggest associated  infection but will treat empirically had a cough intermittently at home and white blood cell count is elevated here.  Advised to follow-up PCP.  Discussed return precautions.  Vital stable discharged in good condition.  Patient management required discussion with the following services or consulting groups:  None  Complexity of Problems Addressed Acute complicated illness or Injury  Additional Data Reviewed and Analyzed Further history obtained from: Further history from spouse/family member, Past medical history and medications listed in the EMR, and Prior ED visit notes  Patient Encounter Risk Assessment Prescriptions         Final Clinical Impression(s) / ED Diagnoses Final diagnoses:  SOB (shortness of breath)    Rx / DC Orders ED Discharge Orders          Ordered    azithromycin (ZITHROMAX) 250 MG tablet  Daily        08/21/23 1410    predniSONE (DELTASONE) 10 MG tablet  Daily        08/21/23 1410    albuterol (VENTOLIN HFA) 108 (90 Base) MCG/ACT inhaler  Every 6 hours PRN        08/21/23 1410              Gareth Eagle, PA-C 08/21/23 1412    Cathren Laine, MD 08/22/23 [phone]

## 2023-08-26 ENCOUNTER — Ambulatory Visit: Payer: 59 | Attending: Nurse Practitioner | Admitting: Nurse Practitioner

## 2023-08-26 ENCOUNTER — Encounter: Payer: Self-pay | Admitting: Nurse Practitioner

## 2023-08-26 VITALS — BP 123/89 | HR 123 | Ht 68.0 in | Wt 227.2 lb

## 2023-08-26 DIAGNOSIS — Z7984 Long term (current) use of oral hypoglycemic drugs: Secondary | ICD-10-CM

## 2023-08-26 DIAGNOSIS — J984 Other disorders of lung: Secondary | ICD-10-CM | POA: Diagnosis not present

## 2023-08-26 DIAGNOSIS — Z09 Encounter for follow-up examination after completed treatment for conditions other than malignant neoplasm: Secondary | ICD-10-CM

## 2023-08-26 DIAGNOSIS — Z1211 Encounter for screening for malignant neoplasm of colon: Secondary | ICD-10-CM

## 2023-08-26 DIAGNOSIS — E1165 Type 2 diabetes mellitus with hyperglycemia: Secondary | ICD-10-CM

## 2023-08-26 NOTE — Progress Notes (Signed)
Assessment & Plan:  Jonathan Gonzalez was seen today for hospitalization follow-up.  Diagnoses and all orders for this visit:  Hospital discharge follow-up PFT ordered. Continue albuterol and prednisone as prescribed.   Restrictive airway disease -     Pulmonary function test; Future  Type 2 diabetes mellitus with hyperglycemia, without long-term current use of insulin (HCC) -     Urine Albumin/Creatinine with ratio (send out) [LAB689] -     Hemoglobin A1c -     Ambulatory referral to Ophthalmology  Colon cancer screening -     Ambulatory referral to Gastroenterology    Patient has been counseled on age-appropriate routine health concerns for screening and prevention. These are reviewed and up-to-date. Referrals have been placed accordingly. Immunizations are up-to-date or declined.    Subjective:   Chief Complaint  Patient presents with   Hospitalization Follow-up    Jonathan Gonzalez 62 y.o. male presents to office today for HFU  He has a past medical history of DM2, Hyperlipidemia, AFib with RVR, CHF, and Hypertension.  He is overdue for Cardiology follow up.   HFU Jonathan Gonzalez was seen in the emergency room on 08/21/2023 with complaints of shortness of breath which had started several weeks prior.  Workup was negative for PE, ACS and A-fib.  It was suspected that he may have had a mild asthma exacerbation as he does have a history of asthma.  He was sent home with 2-week treatment of p.o. prednisone, albuterol inhaler and Z-Pak Today he reports improvement in shortness of breath.  Endorses insomnia which is likely related to the prednisone.  At this time he is using his albuterol inhaler once a day.    Review of Systems  Constitutional:  Negative for fever, malaise/fatigue and weight loss.  HENT: Negative.  Negative for nosebleeds.   Eyes: Negative.  Negative for blurred vision, double vision and photophobia.  Respiratory:  Positive for shortness of breath. Negative for cough,  hemoptysis, sputum production and wheezing.   Cardiovascular: Negative.  Negative for chest pain, palpitations and leg swelling.  Gastrointestinal: Negative.  Negative for heartburn, nausea and vomiting.  Musculoskeletal: Negative.  Negative for myalgias.  Neurological: Negative.  Negative for dizziness, focal weakness, seizures and headaches.  Psychiatric/Behavioral: Negative.  Negative for suicidal ideas.     Past Medical History:  Diagnosis Date   A-fib (HCC)    Asthma    Diabetes mellitus without complication (HCC)    GERD (gastroesophageal reflux disease)    Hyperlipidemia    Hypertension     Past Surgical History:  Procedure Laterality Date   BUBBLE STUDY  12/06/2022   Procedure: BUBBLE STUDY;  Surgeon: Parke Poisson, MD;  Location: [facility] ENDOSCOPY;  Service: Cardiovascular;;   CARDIOVERSION N/A 12/06/2022   Procedure: CARDIOVERSION;  Surgeon: Parke Poisson, MD;  Location: [facility] ENDOSCOPY;  Service: Cardiovascular;  Laterality: N/A;   CARDIOVERSION N/A 01/15/2023   Procedure: CARDIOVERSION;  Surgeon: Dolores Patty, MD;  Location: [facility] INVASIVE CV LAB;  Service: Cardiovascular;  Laterality: N/A;   CARDIOVERSION N/A 02/25/2023   Procedure: CARDIOVERSION;  Surgeon: Dolores Patty, MD;  Location: [facility] INVASIVE CV LAB;  Service: Cardiovascular;  Laterality: N/A;   HERNIA REPAIR     TEE WITHOUT CARDIOVERSION N/A 12/06/2022   Procedure: TRANSESOPHAGEAL ECHOCARDIOGRAM (TEE);  Surgeon: Parke Poisson, MD;  Location: [facility] ENDOSCOPY;  Service: Cardiovascular;  Laterality: N/A;   TEE WITHOUT CARDIOVERSION N/A 01/15/2023   Procedure: TRANSESOPHAGEAL ECHOCARDIOGRAM;  Surgeon: Dolores Patty, MD;  Location: [facility] INVASIVE CV LAB;  Service: Cardiovascular;  Laterality: N/A;    No family history on file.  Social History Reviewed with no changes to be made today.   Outpatient Medications Prior to Visit  Medication Sig Dispense Refill   albuterol (VENTOLIN HFA) 108 (90 Base)  MCG/ACT inhaler Inhale 1-2 puffs into the lungs every 6 (six) hours as needed for wheezing or shortness of breath. 18 g 2   apixaban (ELIQUIS) 5 MG TABS tablet Take 1 tablet (5 mg total) by mouth 2 (two) times daily. 60 tablet 5   Aspirin-Salicylamide-Caffeine (BC HEADACHE POWDER PO) Take 1 packet by mouth daily as needed (headache, pain).     atorvastatin (LIPITOR) 10 MG tablet Take 1 tablet (10 mg total) by mouth daily. 90 tablet 1   azithromycin (ZITHROMAX) 250 MG tablet Take 2 tablets (500 mg total) by mouth daily for 1 day, THEN 1 tablet (250 mg total) daily for 4 days. 6 tablet 0   empagliflozin (JARDIANCE) 10 MG TABS tablet Take 1 tablet (10 mg total) by mouth daily. 90 tablet 3   furosemide (LASIX) 40 MG tablet Take 1 tablet (40 mg total) by mouth daily as needed. For 3 lb weight gain overnight or 5 lbs in one week 90 tablet 1   metFORMIN (GLUCOPHAGE) 500 MG tablet Take 1 tablet (500 mg total) by mouth 2 (two) times daily with a meal. 60 tablet 4   omeprazole (PRILOSEC) 20 MG capsule Take 1 capsule (20 mg total) by mouth daily. FOR ACID REFLUX (Patient taking differently: Take 20 mg by mouth daily as needed (acid reflux).) 90 capsule 1   predniSONE (DELTASONE) 10 MG tablet Take 2 tablets (20 mg total) by mouth daily. 15 tablet 0   sacubitril-valsartan (ENTRESTO) 24-26 MG Take 1 tablet by mouth 2 (two) times daily. 60 tablet 3   amiodarone (PACERONE) 200 MG tablet Take 1 tablet (200 mg total) by mouth 2 (two) times daily. (Patient not taking: Reported on 08/21/2023) 60 tablet 5   No facility-administered medications prior to visit.    No Known Allergies     Objective:    BP 123/89 (BP Location: Left Arm, Patient Position: Sitting, Cuff Size: Normal)   Pulse (!) 123   Ht 5\' 8"  (1.727 m)   Wt 227 lb 3.2 oz (103.1 kg)   SpO2 97%   BMI 34.55 kg/m  Wt Readings from Last 3 Encounters:  08/26/23 227 lb 3.2 oz (103.1 kg)  08/21/23 230 lb (104.3 kg)  02/26/23 228 lb 6.3 oz (103.6 kg)     Physical Exam Vitals and nursing note reviewed.  Constitutional:      Appearance: He is well-developed.  HENT:     Head: Normocephalic and atraumatic.  Cardiovascular:     Rate and Rhythm: Regular rhythm. Tachycardia present.     Heart sounds: Normal heart sounds. No murmur heard.    No friction rub. No gallop.  Pulmonary:     Effort: Pulmonary effort is normal. No tachypnea or respiratory distress.     Breath sounds: Normal breath sounds. No decreased breath sounds, wheezing, rhonchi or rales.  Chest:     Chest wall: No tenderness.  Abdominal:     General: Bowel sounds are normal.     Palpations: Abdomen is soft.  Musculoskeletal:        General: Normal range of motion.     Cervical back: Normal range of motion.  Skin:    General: Skin is warm and dry.  Neurological:     Mental Status: He is alert and oriented to person, place, and time.     Coordination: Coordination normal.  Psychiatric:        Behavior: Behavior normal. Behavior is cooperative.        Thought Content: Thought content normal.        Judgment: Judgment normal.          Patient has been counseled extensively about nutrition and exercise as well as the importance of adherence with medications and regular follow-up. The patient was given clear instructions to go to ER or return to medical center if symptoms don't improve, worsen or new problems develop. The patient verbalized understanding.   Follow-up: Return in about 3 months (around 11/24/2023).   Claiborne Rigg, FNP-BC [facility] [place], [place] [phone]   08/26/2023, 11:00 AM

## 2023-08-26 NOTE — Patient Instructions (Signed)
[facility] [address] [place], [place] [address] ?PH# [phone] ?

## 2023-08-27 ENCOUNTER — Inpatient Hospital Stay (HOSPITAL_COMMUNITY)
Admission: RE | Admit: 2023-08-27 | Discharge: 2023-08-27 | Disposition: A | Discharge status: Home / Self Care | Payer: 59 | Source: Ambulatory Visit | Attending: Nurse Practitioner | Admitting: Nurse Practitioner

## 2023-08-27 LAB — MICROALBUMIN / CREATININE URINE RATIO
Creatinine, Urine: 194.3 mg/dL
Microalb/Creat Ratio: 27 mg/g{creat} (ref 0–29)
Microalbumin, Urine: 52.2 ug/mL

## 2023-08-27 LAB — HEMOGLOBIN A1C
Est. average glucose Bld gHb Est-mCnc: 134 mg/dL
Hgb A1c MFr Bld: 6.3 % — ABNORMAL HIGH (ref 4.8–5.6)

## 2023-10-24 ENCOUNTER — Other Ambulatory Visit: Payer: Self-pay

## 2023-10-25 ENCOUNTER — Other Ambulatory Visit: Payer: Self-pay

## 2023-11-22 ENCOUNTER — Encounter: Payer: Self-pay | Admitting: Student

## 2023-11-22 ENCOUNTER — Ambulatory Visit: Attending: Student | Admitting: Student

## 2023-11-22 ENCOUNTER — Other Ambulatory Visit: Payer: Self-pay

## 2023-11-22 VITALS — BP 118/70 | HR 115 | Ht 68.0 in | Wt 235.2 lb

## 2023-11-22 DIAGNOSIS — I5020 Unspecified systolic (congestive) heart failure: Secondary | ICD-10-CM

## 2023-11-22 DIAGNOSIS — I5022 Chronic systolic (congestive) heart failure: Secondary | ICD-10-CM

## 2023-11-22 DIAGNOSIS — I10 Essential (primary) hypertension: Secondary | ICD-10-CM

## 2023-11-22 DIAGNOSIS — I4819 Other persistent atrial fibrillation: Secondary | ICD-10-CM

## 2023-11-22 MED ORDER — AMIODARONE HCL 200 MG PO TABS
200.0000 mg | ORAL_TABLET | Freq: Every day | ORAL | 3 refills | Status: DC
  Filled 2023-11-22: qty 90, 90d supply, fill #0

## 2023-11-22 MED ORDER — FUROSEMIDE 40 MG PO TABS: 40.0000 mg | ORAL_TABLET | Freq: Every day | ORAL | 1 refills | Status: DC | PRN

## 2023-11-22 MED ORDER — METOPROLOL SUCCINATE ER 50 MG PO TB24
50.0000 mg | ORAL_TABLET | Freq: Every day | ORAL | 1 refills | Status: DC
  Filled 2023-11-22: qty 90, 90d supply, fill #0

## 2023-11-22 NOTE — Progress Notes (Signed)
  Electrophysiology Office Note:   Date:  11/22/2023  ID:  Jonathan Gonzalez, DOB July 06, 1961, MRN [id number]  Primary Cardiologist: Donato Schultz, MD Electrophysiologist: Lanier Prude, MD      History of Present Illness:   Jonathan Gonzalez is a 63 y.o. male with h/o persistent AF, hypercoagulable state, obesity, snoring, HTN, HFrEF, and sinus tachycardia seen today for routine electrophysiology followup.   Since last being seen in our clinic the patient reports doing OK. They briefly moved to [facility] but are now back. He has been out of his amiodarone and lasix for 2-4 weeks.  Having mild SOB with moderate or more exertion. Feels like he is holding on to fluid as well.   Review of systems complete and found to be negative unless listed in HPI.   EP Information / Studies Reviewed:    EKG is ordered today. Personal review as below.  EKG Interpretation Date/Time:  Friday November 22 2023 10:41:14 EST Ventricular Rate:  115 PR Interval:  138 QRS Duration:  62 QT Interval:  324 QTC Calculation: 448 R Axis:   28  Text Interpretation: Sinus tachycardia When compared with ECG of 21-Aug-2023 09:35, No significant change was found Confirmed by Maxine Glenn [phone]) on 11/22/2023 10:50:54 AM    Arrhythmia/Device History No specialty comments available.   Physical Exam:   VS:  BP 118/70   Pulse (!) 115   Ht 5\' 8"  (1.727 m)   Wt 235 lb 3.2 oz (106.7 kg)   SpO2 94%   BMI 35.76 kg/m    Wt Readings from Last 3 Encounters:  11/22/23 235 lb 3.2 oz (106.7 kg)  08/26/23 227 lb 3.2 oz (103.1 kg)  08/21/23 230 lb (104.3 kg)     GEN: No acute distress NECK: No JVD; No carotid bruits CARDIAC: Regular rate and rhythm, no murmurs, rubs, gallops RESPIRATORY:  Clear to auscultation without rales, wheezing or rhonchi  ABDOMEN: Soft, non-tender, non-distended EXTREMITIES:  1+ edema; No deformity   ASSESSMENT AND PLAN:    Persistent AF EKG today shows sinus tach in 110s Continue eliquis 5 mg  BID for CHA2DS2VASc of at least 3 Resume 50 mg daily metoprolol Resume amiodarone at 200 mg daily  Chronic systolic CHF NYHA III symptoms Volume status somewhat elevated. Has been out of lasix.  Resume lasix 40 mg prn daily, would take x 3 days Re-establish with HF clinic.  If he has worsening symptoms would be checked in ED.   HTN Stable on current regimen   Follow up with EP APP in 3 months. HF clinic soon.   Signed, Graciella Freer, PA-C

## 2023-11-22 NOTE — Patient Instructions (Signed)
 Medication Instructions:  Lasix 40 mg ( Take 1 Tablet Daily For 3 Days. Then Resume  as Needed). Start Toprol-XL 50 mg ( Take 1 Tablet Daily). Decrease Amiodarone to 200 mg ( Take 1 Tablet Daily). *If you need a refill on your cardiac medications before your next appointment, please call your pharmacy*   Lab Work: No Labs If you have labs (blood work) drawn today and your tests are completely normal, you will receive your results only by: MyChart Message (if you have MyChart) OR A paper copy in the mail If you have any lab test that is abnormal or we need to change your treatment, we will call you to review the results.   Testing/Procedures: No Testing   Follow-Up: At [facility], you and your health needs are our priority.  As part of our continuing mission to provide you with exceptional heart care, we have created designated Provider Care Teams.  These Care Teams include your primary Cardiologist (physician) and Advanced Practice Providers (APPs -  Physician Assistants and Nurse Practitioners) who all work together to provide you with the care you need, when you need it.  We recommend signing up for the patient portal called "MyChart".  Sign up information is provided on this After Visit Summary.  MyChart is used to connect with patients for Virtual Visits (Telemedicine).  Patients are able to view lab/test results, encounter notes, upcoming appointments, etc.  Non-urgent messages can be sent to your provider as well.   To learn more about what you can do with MyChart, go to [email or web address].    Your next appointment:   3 month(s)   Provider:    Casimiro Needle "Otilio Saber, PA-C  Other Instructions     1st Floor: - Lobby - Registration  - Pharmacy  - Lab - Cafe  2nd Floor: - PV Lab - Diagnostic Testing (echo, CT, nuclear med)  3rd Floor: - Vacant  4th Floor: - TCTS (cardiothoracic surgery) - AFib Clinic - Structural Heart Clinic - Vascular  Surgery  - Vascular Ultrasound  5th Floor: - HeartCare Cardiology (general and EP) - Clinical Pharmacy for coumadin, hypertension, lipid, weight-loss medications, and med management appointments    Valet parking services will be available as well.

## 2023-11-25 ENCOUNTER — Ambulatory Visit: Payer: Self-pay | Admitting: Nurse Practitioner

## 2023-11-25 NOTE — Telephone Encounter (Signed)
  Chief Complaint: urinary burning sensation hx DM  Symptoms: burning at end of urinating. Reports he took lasix 40 daily over weekend per MD orders and noted urinating on drops of urine. Has not taken lasix today and was able to produce a stream of urine but noticed burning sensation at end or urination.  Frequency: over weekend  Pertinent Negatives: Patient denies no chest pain no difficulty breathing no fever no retention of urine at this time.  Disposition: [] ED /[] Urgent Care (no appt availability in office) / [] Appointment(In office/virtual)/ []  [facility] [place] [facility]/ [] Home Care/ [] Refused Recommended Disposition /[x] [facility]/ []  Follow-up with PCP Additional Notes:   No available appt with PCP or practice. Today . Please advise if patient can be seen tomorrow with any provider due to hx DM. Recommended mobile bus today. Unsure if patient will go . Patient reports if he needs UC or stops urinating again he will go . Please advise.     Copied from CRM [id number]. Topic: Clinical - Red Word Triage >> Nov 25, 2023 10:54 AM Jonathan Gonzalez wrote: [facility] that prompted transfer to Nurse Triage: was prescribed medication ( one was lasik ) still not using the bathroom much, but experience burning sensation now at the end of him urinating Reason for Disposition  Diabetes mellitus or weak immune system (e.Gonzalez., HIV positive, cancer chemo, splenectomy, organ transplant, chronic steroids)  Answer Assessment - Initial Assessment Questions 1. SEVERITY: "How bad is the pain?"  (e.Gonzalez., Scale 1-10; mild, moderate, or severe)   - MILD (1-3): Complains slightly about urination hurting.   - MODERATE (4-7): Interferes with normal activities.     - SEVERE (8-10): Excruciating, unwilling or unable to urinate because of the pain.      8/10 2. FREQUENCY: "How many times have you had painful urination today?"      everytime 3. PATTERN: "Is pain present every time you urinate or just sometimes?"       Burning sensation 4. ONSET: "When did the painful urination start?"      Sunday  5. FEVER: "Do you have a fever?" If Yes, ask: "What is your temperature, how was it measured, and when did it start?"     Na  6. PAST UTI: "Have you had a urine infection before?" If Yes, ask: "When was the last time?" and "What happened that time?"      na 7. CAUSE: "What do you think is causing the painful urination?"      Not sure  8. OTHER SYMPTOMS: "Do you have any other symptoms?" (e.Gonzalez., flank pain, penis discharge, scrotal pain, blood in urine)     Only drops of urine noted over weekend but able to produce a stream today with burning sensation at of urinating  Protocols used: Urination Pain - Male-A-AH

## 2023-11-25 NOTE — Telephone Encounter (Signed)
 Patient was added to the walkin schedule for tomorrow morning.

## 2023-11-26 ENCOUNTER — Encounter: Payer: Self-pay | Admitting: Nurse Practitioner

## 2023-11-26 ENCOUNTER — Ambulatory Visit: Attending: Nurse Practitioner | Admitting: Nurse Practitioner

## 2023-11-26 VITALS — BP 114/83 | HR 136 | Resp 19 | Ht 68.0 in | Wt 230.6 lb

## 2023-11-26 DIAGNOSIS — I10 Essential (primary) hypertension: Secondary | ICD-10-CM | POA: Diagnosis not present

## 2023-11-26 DIAGNOSIS — R300 Dysuria: Secondary | ICD-10-CM | POA: Diagnosis not present

## 2023-11-26 DIAGNOSIS — R5383 Other fatigue: Secondary | ICD-10-CM | POA: Diagnosis not present

## 2023-11-26 DIAGNOSIS — R7989 Other specified abnormal findings of blood chemistry: Secondary | ICD-10-CM

## 2023-11-26 DIAGNOSIS — Z1211 Encounter for screening for malignant neoplasm of colon: Secondary | ICD-10-CM

## 2023-11-26 DIAGNOSIS — I5022 Chronic systolic (congestive) heart failure: Secondary | ICD-10-CM

## 2023-11-26 DIAGNOSIS — H539 Unspecified visual disturbance: Secondary | ICD-10-CM

## 2023-11-26 NOTE — Progress Notes (Signed)
 Patient identified by name and date of birth.  Patient's son will advise patient of request.

## 2023-11-26 NOTE — Progress Notes (Signed)
 I have seen and examined this patient with the advanced practice provider STUDENT and agree with the note below

## 2023-11-26 NOTE — Progress Notes (Signed)
 Jonathan Gonzalez was seen today for dysuria and hematuria.  Diagnoses and all orders for this visit:  Primary hypertension -     CMP14+EGFR Continue all antihypertensives as prescribed.  Reminded to bring in blood pressure log for follow  up appointment.  RECOMMENDATIONS: DASH/Mediterranean Diets are healthier choices for HTN.    Fatigue, unspecified type -     VITAMIN D 25 Hydroxy (Vit-D Deficiency, Fractures) -     CMP14+EGFR -     CBC with Differential  Encounter for screening colonoscopy -     Ambulatory referral to Gastroenterology  Dysuria -     PSA -     Urinalysis, Complete Drink water to remain hydrated Reduce or eliminate energy drinks, caffeine and dark colored soda  Vision changes -     Ambulatory referral to Ophthalmology  Heart failure with mildly reduced ejection fraction (HFmrEF) (HCC) Follow up with Cardiology as scheduled -     Brain natriuretic peptide DASH DIET, limit sodium intake, and eliminate high processed foods Daily weights, report weight gain 3 to 5 lbs in one day or week Continue Furosemide 40 mg every daily as needed, for 3 lb weight gain overnight or 5 lbs in one week  Abnormal CBC Pending lab results     Subjective:   Chief Complaint  Patient presents with   Dysuria   Hematuria    Jonathan Gonzalez 63 y.o. male presents to office today for complaints of burning with urinating with blood in urine. States burning started 2 days ago with dark colored urine streaked with blood. Burning started subsiding today. Drinking energy drinks Celsius to help him stay awake during the day due to lack of sleep at night. Reports feeling fatigue, and tired due to lack of sleep which started a few weeks ago. Reports having shortness of breath, had recent visit with Cardiologist on 11/22/2023, restarted medications.Scheduled to take furosemide x 3 days for increased volume. He stopped taking medications, due to living in another [place] a few months. Patient is  reestablishing with Cardiologist in [place] and has an appt with HF clinic on 12/20/2023. States he stopped taking Entresto due to vision changes, seeing white spots. Reports whites spots went away, but did not alert Cardiology that he had discontinued medication.   Reports he lives in hotel and uses microwave to prepare foods. Eating foods too high in sodium due to his living situation. Recently purchased an air fryer. Educated to reduce sodium intake, limit foods high in sugar, and highly processed. Will need a scale from HF clinic if not already provided.   Dysuria  This is a new problem. Episode onset: 3 days ago. The problem occurs every urination. Associated symptoms include hematuria. Pertinent negatives include no chills.  Hematuria Associated symptoms include dysuria. Pertinent negatives include no chills or fever.    Review of Systems  Constitutional:  Positive for malaise/fatigue. Negative for chills and fever.  Eyes:  Negative for blurred vision.       Floaters  Respiratory:  Positive for shortness of breath. Negative for wheezing.   Cardiovascular:  Negative for chest pain.  Gastrointestinal: Negative.   Genitourinary:  Positive for dysuria and hematuria.  Musculoskeletal: Negative.   Skin: Negative.   Neurological: Negative.   Endo/Heme/Allergies: Negative.   Psychiatric/Behavioral:  Negative for depression. The patient has insomnia.     Past Medical History:  Diagnosis Date   A-fib (HCC)    Asthma    Diabetes mellitus without complication (HCC)  GERD (gastroesophageal reflux disease)    Hyperlipidemia    Hypertension     Past Surgical History:  Procedure Laterality Date   BUBBLE STUDY  12/06/2022   Procedure: BUBBLE STUDY;  Surgeon: Parke Poisson, MD;  Location: [facility] ENDOSCOPY;  Service: Cardiovascular;;   CARDIOVERSION N/A 12/06/2022   Procedure: CARDIOVERSION;  Surgeon: Parke Poisson, MD;  Location: [facility] ENDOSCOPY;  Service: Cardiovascular;  Laterality: N/A;    CARDIOVERSION N/A 01/15/2023   Procedure: CARDIOVERSION;  Surgeon: Dolores Patty, MD;  Location: [facility] INVASIVE CV LAB;  Service: Cardiovascular;  Laterality: N/A;   CARDIOVERSION N/A 02/25/2023   Procedure: CARDIOVERSION;  Surgeon: Dolores Patty, MD;  Location: [facility] INVASIVE CV LAB;  Service: Cardiovascular;  Laterality: N/A;   HERNIA REPAIR     TEE WITHOUT CARDIOVERSION N/A 12/06/2022   Procedure: TRANSESOPHAGEAL ECHOCARDIOGRAM (TEE);  Surgeon: Parke Poisson, MD;  Location: [facility] ENDOSCOPY;  Service: Cardiovascular;  Laterality: N/A;   TEE WITHOUT CARDIOVERSION N/A 01/15/2023   Procedure: TRANSESOPHAGEAL ECHOCARDIOGRAM;  Surgeon: Dolores Patty, MD;  Location: [facility] INVASIVE CV LAB;  Service: Cardiovascular;  Laterality: N/A;    No family history on file.  Social History Reviewed with no changes to be made today.   Outpatient Medications Prior to Visit  Medication Sig Dispense Refill   albuterol (VENTOLIN HFA) 108 (90 Base) MCG/ACT inhaler Inhale 1-2 puffs into the lungs every 6 (six) hours as needed for wheezing or shortness of breath. 18 g 2   amiodarone (PACERONE) 200 MG tablet Take 1 tablet (200 mg total) by mouth daily. 90 tablet 3   apixaban (ELIQUIS) 5 MG TABS tablet Take 1 tablet (5 mg total) by mouth 2 (two) times daily. 60 tablet 5   Aspirin-Salicylamide-Caffeine (BC HEADACHE POWDER PO) Take 1 packet by mouth daily as needed (headache, pain).     empagliflozin (JARDIANCE) 10 MG TABS tablet Take 1 tablet (10 mg total) by mouth daily. 90 tablet 3   furosemide (LASIX) 40 MG tablet Take 1 tablet (40 mg total) by mouth daily as needed. For 3 lb weight gain overnight or 5 lbs in one week 90 tablet 1   metFORMIN (GLUCOPHAGE) 500 MG tablet Take 1 tablet (500 mg total) by mouth 2 (two) times daily with a meal. 60 tablet 4   atorvastatin (LIPITOR) 10 MG tablet Take 1 tablet (10 mg total) by mouth daily. (Patient not taking: Reported on 11/26/2023) 90 tablet 1   metoprolol succinate  (TOPROL XL) 50 MG 24 hr tablet Take 1 tablet (50 mg total) by mouth daily. Take with or immediately following a meal. (Patient not taking: Reported on 11/26/2023) 90 tablet 1   omeprazole (PRILOSEC) 20 MG capsule Take 1 capsule (20 mg total) by mouth daily. FOR ACID REFLUX (Patient not taking: Reported on 11/26/2023) 90 capsule 1   predniSONE (DELTASONE) 10 MG tablet Take 2 tablets (20 mg total) by mouth daily. (Patient not taking: Reported on 11/26/2023) 15 tablet 0   sacubitril-valsartan (ENTRESTO) 24-26 MG Take 1 tablet by mouth 2 (two) times daily. (Patient not taking: Reported on 11/26/2023) 60 tablet 3   No facility-administered medications prior to visit.    No Known Allergies     Objective:    BP 114/83 (BP Location: Left Arm, Patient Position: Sitting, Cuff Size: Normal)   Pulse (!) 136   Resp 19   Ht 5\' 8"  (1.727 m)   Wt 230 lb 9.6 oz (104.6 kg)   SpO2 97%   BMI 35.06 kg/m  Wt Readings from Last 3 Encounters:  11/26/23 230 lb 9.6 oz (104.6 kg)  11/22/23 235 lb 3.2 oz (106.7 kg)  08/26/23 227 lb 3.2 oz (103.1 kg)    Physical Exam Vitals and nursing note reviewed.  Constitutional:      General: He is not in acute distress.    Appearance: Normal appearance.  HENT:     Head: Normocephalic.  Cardiovascular:     Rate and Rhythm: Tachycardia present.  Pulmonary:     Effort: Pulmonary effort is normal.     Breath sounds: Normal breath sounds.  Abdominal:     General: There is distension.  Musculoskeletal:     Cervical back: Normal range of motion.  Neurological:     Mental Status: He is alert.  Psychiatric:        Behavior: Behavior is cooperative.         Patient has been counseled extensively about nutrition and exercise as well as the importance of adherence with medications and regular follow-up. The patient was given clear instructions to go to ER or return to medical center if symptoms don't improve, worsen or new problems develop. The patient verbalized  understanding.   Follow-up: Return in about 3 months (around 02/26/2024).   Joette Catching, FNP-BC [facility] [place], [place] [phone]   11/26/2023, 3:26 PM

## 2023-11-27 LAB — CMP14+EGFR
ALT: 29 IU/L (ref 0–44)
AST: 33 IU/L (ref 0–40)
Albumin: 4.3 g/dL (ref 3.9–4.9)
Alkaline Phosphatase: 102 IU/L (ref 44–121)
BUN/Creatinine Ratio: 11 (ref 10–24)
BUN: 14 mg/dL (ref 8–27)
Bilirubin Total: 0.6 mg/dL (ref 0.0–1.2)
CO2: 19 mmol/L — ABNORMAL LOW (ref 20–29)
Calcium: 9.7 mg/dL (ref 8.6–10.2)
Chloride: 97 mmol/L (ref 96–106)
Creatinine, Ser: 1.28 mg/dL — ABNORMAL HIGH (ref 0.76–1.27)
Globulin, Total: 3.1 g/dL (ref 1.5–4.5)
Glucose: 135 mg/dL — ABNORMAL HIGH (ref 70–99)
Potassium: 4 mmol/L (ref 3.5–5.2)
Sodium: 138 mmol/L (ref 134–144)
Total Protein: 7.4 g/dL (ref 6.0–8.5)
eGFR: 62 mL/min/{1.73_m2} (ref >59)

## 2023-11-27 LAB — URINALYSIS, COMPLETE
Bilirubin, UA: NEGATIVE
Nitrite, UA: POSITIVE — AB
Specific Gravity, UA: >=1.03 — AB (ref 1.005–1.030)
Urobilinogen, Ur: 1 mg/dL (ref 0.2–1.0)
pH, UA: 6 (ref 5.0–7.5)

## 2023-11-27 LAB — MICROSCOPIC EXAMINATION
Casts: NONE SEEN /LPF
RBC, Urine: NONE SEEN /HPF (ref 0–2)
WBC, UA: >30 /HPF — AB (ref 0–5)

## 2023-11-27 LAB — PSA: Prostate Specific Ag, Serum: 11.4 ng/mL — ABNORMAL HIGH (ref 0.0–4.0)

## 2023-11-27 LAB — VITAMIN D 25 HYDROXY (VIT D DEFICIENCY, FRACTURES): Vit D, 25-Hydroxy: 6 ng/mL — ABNORMAL LOW (ref 30.0–100.0)

## 2023-11-28 ENCOUNTER — Telehealth: Payer: Self-pay

## 2023-11-28 NOTE — Telephone Encounter (Signed)
 Noted.

## 2023-11-28 NOTE — Telephone Encounter (Signed)
 Copied from CRM [id number]. Topic: General - Other >> Nov 28, 2023  2:49 PM Jonathan Gonzalez wrote: Reason for CRM: Pt called back to report that he is not feeling well enough at this time to return and complete his lab work.

## 2023-12-01 ENCOUNTER — Encounter: Payer: Self-pay | Admitting: Nurse Practitioner

## 2023-12-01 ENCOUNTER — Other Ambulatory Visit: Payer: Self-pay | Admitting: Nurse Practitioner

## 2023-12-01 DIAGNOSIS — E559 Vitamin D deficiency, unspecified: Secondary | ICD-10-CM

## 2023-12-01 DIAGNOSIS — N390 Urinary tract infection, site not specified: Secondary | ICD-10-CM

## 2023-12-01 MED ORDER — VITAMIN D (ERGOCALCIFEROL) 1.25 MG (50000 UNIT) PO CAPS
50000.0000 [IU] | ORAL_CAPSULE | ORAL | 0 refills | Status: DC
  Filled 2023-12-01: qty 12, 84d supply, fill #0

## 2023-12-01 MED ORDER — SULFAMETHOXAZOLE-TRIMETHOPRIM 800-160 MG PO TABS
1.0000 | ORAL_TABLET | Freq: Two times a day (BID) | ORAL | 0 refills | Status: AC
  Filled 2023-12-01: qty 28, 14d supply, fill #0

## 2023-12-02 ENCOUNTER — Other Ambulatory Visit: Payer: Self-pay

## 2023-12-03 ENCOUNTER — Other Ambulatory Visit: Payer: Self-pay

## 2023-12-16 NOTE — Progress Notes (Incomplete)
 Advanced [facility]   Primary Care: Bertram Denver, NP Primary Cardiologist: Dr. Anne Fu HF Cardiologist: Dr. Gala Romney  HPI: 63 y.o. male with history of HFrEF, atrial fibrillation (diagnosed 12/23), HTN, HLD, DM and systolic HF  Patient admitted 29/56/21 with new afib with RVR. Treated with rate control. 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 Echo demonstrated EF 45-50%, RV okay, severe LAE. Plan was for cardioversion at a later date but cancelled the procedure d/t passing of his wife. He later missed doses of eliquis and ran out of metoprolol and farxiga. He was admitted with Afib with RVR and a/c CHF in March 2024. EF down slightly further to 35-40%. He was diuresed and GDMT titrated. He underwent successful DCCV during the admission.  We saw him in [facility] on 01/04/23 and was back in AF. Started on amio and underwent outpatient TEE/DCVV on 01/15/23.   TEE 4/24 EF 30% severe LAE. Mild MR  Admitted 6/24 from AHF clinic with AF with RVR and a/c dHF. Started on amiodarone gtt, diuresed and underwent DCCV to NSR.  Today he returns for HF follow up with his son. Overall feeling fine. More SOB recently, takes Lasix once a week. Rare atypical CP when laying down, resolves with sitting up. Rare dizziness, no falls. Denies palpitations, abnormal bleeding, dizziness, edema, or PND/Orthopnea. Appetite ok. No fever or chills. Not weighing at home. Off Entresto and spiro.  Average home BP 140s systolic. No tobacco/ETOH/drug use. Lives in [place] with autist son. Wife passed last year, previously worked at [employer], but now unemployed.  Cardiac Studies - TEE 4/24: EF 30%, severe LAE, mild MR  - Echo 3/24: EF 35-40%  - Echo 12/23: EF 45%, normal RV  Past Medical History:  Diagnosis Date   A-fib (HCC)    Asthma    Diabetes mellitus without complication (HCC)    GERD (gastroesophageal reflux disease)    Hyperlipidemia    Hypertension    Current Outpatient  Medications  Medication Sig Dispense Refill   albuterol (VENTOLIN HFA) 108 (90 Base) MCG/ACT inhaler Inhale 1-2 puffs into the lungs every 6 (six) hours as needed for wheezing or shortness of breath. 18 g 2   amiodarone (PACERONE) 200 MG tablet Take 1 tablet (200 mg total) by mouth daily. 90 tablet 3   apixaban (ELIQUIS) 5 MG TABS tablet Take 1 tablet (5 mg total) by mouth 2 (two) times daily. 60 tablet 5   Aspirin-Salicylamide-Caffeine (BC HEADACHE POWDER PO) Take 1 packet by mouth daily as needed (headache, pain).     empagliflozin (JARDIANCE) 10 MG TABS tablet Take 1 tablet (10 mg total) by mouth daily. 90 tablet 3   furosemide (LASIX) 40 MG tablet Take 1 tablet (40 mg total) by mouth daily as needed. For 3 lb weight gain overnight or 5 lbs in one week 90 tablet 1   metFORMIN (GLUCOPHAGE) 500 MG tablet Take 1 tablet (500 mg total) by mouth 2 (two) times daily with a meal. (Patient taking differently: Take 500 mg by mouth 2 (two) times daily with a meal. Daily) 60 tablet 4   metoprolol succinate (TOPROL XL) 50 MG 24 hr tablet Take 1 tablet (50 mg total) by mouth daily. Take with or immediately following a meal. 90 tablet 1   omeprazole (PRILOSEC) 20 MG capsule Take 1 capsule (20 mg total) by mouth daily. FOR ACID REFLUX (Patient taking differently: Take 20 mg by mouth daily. FOR ACID REFLUX AS NEEDED) 90 capsule 1  Vitamin D, Ergocalciferol, (DRISDOL) 1.25 MG (50000 UNIT) CAPS capsule Take 1 capsule (50,000 Units total) by mouth once a week. (Patient taking differently: Take 50,000 Units by mouth once a week. Takes on Tuesdays) 12 capsule 0   No current facility-administered medications for this encounter.   No Known Allergies  Social History   Socioeconomic History   Marital status: Widowed    Spouse name: Not on file   Number of children: 1   Years of education: Not on file   Highest education level: 12th grade  Occupational History   Occupation: [employer]  Tobacco Use   Smoking  status: Never   Smokeless tobacco: Never   Tobacco comments:    Never smoke 10/08/22  Vaping Use   Vaping status: Never Used  Substance and Sexual Activity   Alcohol use: Never   Drug use: Never   Sexual activity: Not Currently  Other Topics Concern   Not on file  Social History Narrative   Not on file   Social Drivers of Health   Financial Resource Strain: High Risk (11/25/2023)   Overall Financial Resource Strain (CARDIA)    Difficulty of Paying Living Expenses: Hard  Food Insecurity: No Food Insecurity (11/25/2023)   Hunger Vital Sign    Worried About Running Out of Food in the Last Year: Never true    Ran Out of Food in the Last Year: Never true  Transportation Needs: Unmet Transportation Needs (11/25/2023)   PRAPARE - Administrator, Civil Service (Medical): Yes    Lack of Transportation (Non-Medical): No  Physical Activity: Inactive (11/25/2023)   Exercise Vital Sign    Days of Exercise per Week: 0 days    Minutes of Exercise per Session: 0 min  Stress: Stress Concern Present (11/25/2023)   [employer] of Occupational Health - Occupational Stress Questionnaire    Feeling of Stress : Very much  Social Connections: Socially Isolated (11/25/2023)   Social Connection and Isolation Panel [NHANES]    Frequency of Communication with Friends and Family: Once a week    Frequency of Social Gatherings with Friends and Family: Never    Attends Religious Services: Never    Database administrator or Organizations: No    Attends Engineer, structural: Not on file    Marital Status: Widowed  Intimate Partner Violence: Not At Risk (12/04/2022)   Humiliation, Afraid, Rape, and Kick questionnaire    Fear of Current or Ex-Partner: No    Emotionally Abused: No    Physically Abused: No    Sexually Abused: No   No family history on file.  Wt Readings from Last 3 Encounters:  12/20/23 103 kg (227 lb)  11/26/23 104.6 kg (230 lb 9.6 oz)  11/22/23 106.7 kg (235 lb  3.2 oz)   BP (!) 154/86   Pulse 97   Wt 103 kg (227 lb)   SpO2 97%   BMI 34.52 kg/m   PHYSICAL EXAM: General:  NAD. No resp difficulty, walked into clinic HEENT: Normal Neck: Supple. No JVD. Thick neck Cor: Regular rate & rhythm. No rubs, gallops or murmurs. Lungs: Clear Abdomen: Soft, obese, nontender, nondistended.  Extremities: No cyanosis, clubbing, rash, edema Neuro: Alert & oriented x 3, moves all 4 extremities w/o difficulty. Affect pleasant.  ReDs reading: 30%, normal  ASSESSMENT & PLAN: 1. Paroxysmal atrial fibrillation with RVR - Diagnosed 12/23 - Successful DCCV 3/24, 4/24 & 6/24 - Continue amiodarone 200 mg daily. - Continue Eliquis 5  mg bid. - Would aim for eventual ablation.  - CBC and amio labs today   2. Chronic systolic HF - Suspect possibly tachy-mediated. Discovered in setting of Afib with RVR in 08/2022.  - Echo 12/23: EF 45-50%, RV okay, severe LAE. No prior echo for comparison. - Echo 3/24 in setting of Afib with RVR: EF 35-40% - TEE 4/24 EF 30% - NYHA IIb-III, volume OK ReDs 30% - Restart Entresto 24/26 mg bid - Continue Lasix 40 mg PRN - Continue Toprol XL 50 mg daily - Continue Jardiance 10 mg daily - Add spiro back next visit. - Labs today, repeat BMET at follow up - Repeat echo in 3 months once back on GDMT. - Gifted a scale today - Refer to CR   3. HTN - BP up today - Restart Entresto as above - Has BP cuff at home - Consider sleep study down the road   4. HLD - Continue atorvastatin   5. CKD: Stage 3 - Baseline SCr~ 1.5.  - Continue SGLT2i - Labs today  6. SDOH - Insured - Has transportation needs - Lives in hotel with autistic son - Engage HFSW for resources  Follow up in 3 weeks with PharmD (add spiro) and 3 months with Dr .Gala Romney + echo  [place], FNP-BC 12/20/23

## 2023-12-19 ENCOUNTER — Telehealth (HOSPITAL_COMMUNITY): Payer: Self-pay

## 2023-12-19 NOTE — Telephone Encounter (Signed)
 Called to confirm/remind patient of their appointment at the [facility] on 12/20/23***.   Appointment:   [x] Confirmed  [] Left mess   [] No answer/No voice mail  [] Phone not in service  Patient reminded to bring all medications and/or complete list.  Confirmed patient has transportation. Gave directions, instructed to utilize valet parking.

## 2023-12-20 ENCOUNTER — Other Ambulatory Visit: Payer: Self-pay | Admitting: Nurse Practitioner

## 2023-12-20 ENCOUNTER — Other Ambulatory Visit: Payer: Self-pay

## 2023-12-20 ENCOUNTER — Other Ambulatory Visit (HOSPITAL_COMMUNITY): Payer: Self-pay | Admitting: *Deleted

## 2023-12-20 ENCOUNTER — Telehealth (HOSPITAL_COMMUNITY): Payer: Self-pay | Admitting: *Deleted

## 2023-12-20 ENCOUNTER — Ambulatory Visit (HOSPITAL_COMMUNITY)
Admission: RE | Admit: 2023-12-20 | Discharge: 2023-12-20 | Disposition: A | Discharge status: Home / Self Care | Source: Ambulatory Visit | Attending: Family Medicine | Admitting: Family Medicine

## 2023-12-20 ENCOUNTER — Encounter (HOSPITAL_COMMUNITY): Payer: Self-pay

## 2023-12-20 VITALS — BP 154/86 | HR 97 | Wt 227.0 lb

## 2023-12-20 DIAGNOSIS — I480 Paroxysmal atrial fibrillation: Secondary | ICD-10-CM | POA: Insufficient documentation

## 2023-12-20 DIAGNOSIS — Z5982 Transportation insecurity: Secondary | ICD-10-CM | POA: Insufficient documentation

## 2023-12-20 DIAGNOSIS — Z7984 Long term (current) use of oral hypoglycemic drugs: Secondary | ICD-10-CM | POA: Diagnosis not present

## 2023-12-20 DIAGNOSIS — I10 Essential (primary) hypertension: Secondary | ICD-10-CM | POA: Diagnosis not present

## 2023-12-20 DIAGNOSIS — Z79899 Other long term (current) drug therapy: Secondary | ICD-10-CM | POA: Diagnosis not present

## 2023-12-20 DIAGNOSIS — I130 Hypertensive heart and chronic kidney disease with heart failure and stage 1 through stage 4 chronic kidney disease, or unspecified chronic kidney disease: Secondary | ICD-10-CM | POA: Insufficient documentation

## 2023-12-20 DIAGNOSIS — N183 Chronic kidney disease, stage 3 unspecified: Secondary | ICD-10-CM | POA: Insufficient documentation

## 2023-12-20 DIAGNOSIS — I5022 Chronic systolic (congestive) heart failure: Secondary | ICD-10-CM

## 2023-12-20 DIAGNOSIS — I4891 Unspecified atrial fibrillation: Secondary | ICD-10-CM | POA: Diagnosis not present

## 2023-12-20 DIAGNOSIS — E611 Iron deficiency: Secondary | ICD-10-CM

## 2023-12-20 DIAGNOSIS — E785 Hyperlipidemia, unspecified: Secondary | ICD-10-CM | POA: Insufficient documentation

## 2023-12-20 DIAGNOSIS — Z5986 Financial insecurity: Secondary | ICD-10-CM | POA: Diagnosis not present

## 2023-12-20 DIAGNOSIS — Z7901 Long term (current) use of anticoagulants: Secondary | ICD-10-CM | POA: Insufficient documentation

## 2023-12-20 DIAGNOSIS — D649 Anemia, unspecified: Secondary | ICD-10-CM

## 2023-12-20 DIAGNOSIS — Z5901 Sheltered homelessness: Secondary | ICD-10-CM | POA: Diagnosis not present

## 2023-12-20 DIAGNOSIS — E1122 Type 2 diabetes mellitus with diabetic chronic kidney disease: Secondary | ICD-10-CM | POA: Diagnosis not present

## 2023-12-20 DIAGNOSIS — Z139 Encounter for screening, unspecified: Secondary | ICD-10-CM

## 2023-12-20 LAB — CBC
HCT: 35.7 % — ABNORMAL LOW (ref 39.0–52.0)
Hemoglobin: 9.9 g/dL — ABNORMAL LOW (ref 13.0–17.0)
MCH: 19.3 pg — ABNORMAL LOW (ref 26.0–34.0)
MCHC: 27.7 g/dL — ABNORMAL LOW (ref 30.0–36.0)
MCV: 69.7 fL — ABNORMAL LOW (ref 80.0–100.0)
Platelets: 378 10*3/uL (ref 150–400)
RBC: 5.12 MIL/uL (ref 4.22–5.81)
RDW: 17.7 % — ABNORMAL HIGH (ref 11.5–15.5)
WBC: 8.1 10*3/uL (ref 4.0–10.5)
nRBC: 0 % (ref 0.0–0.2)

## 2023-12-20 LAB — IRON AND TIBC
Iron: 16 ug/dL — ABNORMAL LOW (ref 45–182)
Saturation Ratios: 3 % — ABNORMAL LOW (ref 17.9–39.5)
TIBC: 497 ug/dL — ABNORMAL HIGH (ref 250–450)
UIBC: 481 ug/dL

## 2023-12-20 LAB — COMPREHENSIVE METABOLIC PANEL WITH GFR
ALT: 18 U/L (ref 0–44)
AST: 21 U/L (ref 15–41)
Albumin: 3.6 g/dL (ref 3.5–5.0)
Alkaline Phosphatase: 56 U/L (ref 38–126)
Anion gap: 10 (ref 5–15)
BUN: 20 mg/dL (ref 8–23)
CO2: 22 mmol/L (ref 22–32)
Calcium: 9.1 mg/dL (ref 8.9–10.3)
Chloride: 105 mmol/L (ref 98–111)
Creatinine, Ser: 1.23 mg/dL (ref 0.61–1.24)
GFR, Estimated: >60 mL/min (ref >60)
Glucose, Bld: 113 mg/dL — ABNORMAL HIGH (ref 70–99)
Potassium: 4 mmol/L (ref 3.5–5.1)
Sodium: 137 mmol/L (ref 135–145)
Total Bilirubin: 0.7 mg/dL (ref 0.0–1.2)
Total Protein: 6.9 g/dL (ref 6.5–8.1)

## 2023-12-20 LAB — FERRITIN: Ferritin: 3 ng/mL — ABNORMAL LOW (ref 24–336)

## 2023-12-20 LAB — BRAIN NATRIURETIC PEPTIDE: B Natriuretic Peptide: 32.6 pg/mL (ref 0.0–100.0)

## 2023-12-20 LAB — TSH: TSH: 2.91 u[IU]/mL (ref 0.350–4.500)

## 2023-12-20 MED ORDER — ENTRESTO 24-26 MG PO TABS
1.0000 | ORAL_TABLET | Freq: Two times a day (BID) | ORAL | 11 refills | Status: DC
  Filled 2023-12-20: qty 60, 30d supply, fill #0
  Filled 2024-01-21: qty 60, 30d supply, fill #1

## 2023-12-20 NOTE — Addendum Note (Signed)
 Encounter addended by: Levonne Spiller, RN on: 12/20/2023 12:52 PM  Actions taken: Charge Capture section accepted

## 2023-12-20 NOTE — Patient Instructions (Addendum)
 Re-start Entresto 24/26 mg twice daily - Rx sent. Labs today - will call you if abnormal. Referral sent to [facility] - see below. They should call you to schedule first appointment. Return to Heart Failure Pharmacy Clinic in 3 weeks - see below. Return to see Dr. Gala Romney with echo in 3 months. Please call us at [phone] in July to schedule this appointment.  Spoke with Child psychotherapist today re: transportation issues. Provided patient with scales for home use. Please call us at [phone] if any issues prior to your next visit.

## 2023-12-20 NOTE — Addendum Note (Signed)
 Encounter addended by: Jacklynn Ganong, FNP on: 12/20/2023 10:05 AM  Actions taken: Clinical Note Signed

## 2023-12-20 NOTE — Progress Notes (Signed)
 H&V Care Navigation CSW Progress Note  Clinical Social Worker met with patient to assist with transportation resources.  Patient is participating in a Managed Medicaid Plan:  Yes  Patient asked about options for transportation as he has been using Uber/Lyft and is a financial burden. CSW provided resources and the contact information for his Managed Medicaid transportation benefit. Patient verbalizes understanding of follow up to obtain payor transportation. CSW available as needed. Lasandra Beech, LCSW, CCSW-MCS [phone]   SDOH Screenings   Food Insecurity: No Food Insecurity (11/25/2023)  Housing: Low Risk  (11/25/2023)  Transportation Needs: Unmet Transportation Needs (12/20/2023)  Utilities: Not At Risk (12/04/2022)  Alcohol Screen: Low Risk  (12/05/2022)  Depression (PHQ2-9): High Risk (11/26/2023)  Financial Resource Strain: High Risk (11/25/2023)  Physical Activity: Inactive (11/25/2023)  Social Connections: Socially Isolated (11/25/2023)  Stress: Stress Concern Present (11/25/2023)  Tobacco Use: Low Risk  (12/20/2023)  Health Literacy: Adequate Health Literacy (08/26/2023)

## 2023-12-20 NOTE — Progress Notes (Signed)
 ReDS Vest / Clip - 12/20/23 0900       ReDS Vest / Clip   Station Marker C    Ruler Value 31    ReDS Value Range Low volume    ReDS Actual Value 30

## 2023-12-20 NOTE — Telephone Encounter (Signed)
 Called patient per Prince Rome, NP with following lab results and instructions:  "Tsat and ferritin are low. Also hgb has dropped 2 grams. Please watch for s/s of bleeding. I will forward labs to his PCP.   Please arrange an iron transfusion if he is agreeable. Anemia may be contributing to his symptoms."  Pt denies any symptoms of bleeding, but will follow up with his PCP and report any bright red blood or dark black tarry stools. Pt agrees to IV iron. Order placed, will ask Philicia to obtain prior authorization if needed, and schedule appointment and call patient.

## 2024-01-01 ENCOUNTER — Ambulatory Visit: Payer: Self-pay | Admitting: *Deleted

## 2024-01-01 ENCOUNTER — Other Ambulatory Visit: Payer: Self-pay | Admitting: Nurse Practitioner

## 2024-01-01 ENCOUNTER — Ambulatory Visit: Payer: MEDICAID | Attending: Nurse Practitioner

## 2024-01-01 DIAGNOSIS — R399 Unspecified symptoms and signs involving the genitourinary system: Secondary | ICD-10-CM

## 2024-01-01 DIAGNOSIS — N390 Urinary tract infection, site not specified: Secondary | ICD-10-CM | POA: Diagnosis not present

## 2024-01-01 LAB — POCT URINALYSIS DIP (CLINITEK)
Bilirubin, UA: NEGATIVE
Glucose, UA: >=1000 mg/dL — AB
Nitrite, UA: NEGATIVE
POC PROTEIN,UA: 100 — AB
Spec Grav, UA: 1.02 (ref 1.010–1.025)
Urobilinogen, UA: 0.2 U/dL
pH, UA: 5.5 (ref 5.0–8.0)

## 2024-01-01 NOTE — Telephone Encounter (Signed)
 Patient advised to come in to obtain a urine sample. Patient is agreeable.

## 2024-01-01 NOTE — Telephone Encounter (Signed)
 Copied from CRM [id number]. Topic: Clinical - Red Word Triage >> Jan 01, 2024  7:52 AM Izetta Dakin wrote: [facility] that prompted transfer to Nurse Triage: sharp pain in lt side and groin area Reason for Disposition  Urinating more frequently than usual (i.e., frequency)  Answer Assessment - Initial Assessment Questions 1. SYMPTOM: "What's the main symptom you're concerned about?" (e.g., frequency, incontinence)     Last night, suddenly, I started having pain in my left groin area and a little into my abd.  I'm also having discomfort when I urinate.   I just took my last antibiotic for a UTI last week.   These are the same symptoms I had when I saw Bertram Denver, NP on 11/26/2023  and she told me I have a UTI.   My symptoms completely went away with the antibiotic.  2. ONSET: "When did the  pain  start?"     Suddenly last night in my left groin area.    Also discomfort when I urinate. 3. PAIN: "Is there any pain?" If Yes, ask: "How bad is it?" (Scale: 1-10; mild, moderate, severe)     Yes 4. CAUSE: "What do you think is causing the symptoms?"     UTI has come back possibly 5. OTHER SYMPTOMS: "Do you have any other symptoms?" (e.g., blood in urine, fever, flank pain, pain with urination)     Urgency and frequency with urination. Denies flank pain, blood or cloudiness of urine. 6. PREGNANCY: "Is there any chance you are pregnant?" "When was your last menstrual period?"     N/A  Answer Assessment - Initial Assessment Questions 1. LOCATION: "Where does it hurt?"      I'm having pain in my left groin area.   No knots felt.   It's a dull ache.   2. RADIATION: "Does the pain shoot anywhere else?" (e.g., chest, back)     In groin area and into my stomach a little.   Discomfort when I urinate.   3. ONSET: "When did the pain begin?" (Minutes, hours or days ago)      Started suddenly last night.  I just got over a UTI.   I took the last pill last week.    I've been going more often and having urgency.    No blood.  No flank pain.   Urine is not cloudy and does not have an odor.   4. SUDDEN: "Gradual or sudden onset?"     Suddenly. 5. PATTERN "Does the pain come and go, or is it constant?"    - If it comes and goes: "How long does it last?" "Do you have pain now?"     (Note: Comes and goes means the pain is intermittent. It goes away completely between bouts.)    - If constant: "Is it getting better, staying the same, or getting worse?"      (Note: Constant means the pain never goes away completely; most serious pain is constant and gets worse.)      Moderate  6. SEVERITY: "How bad is the pain?"  (e.g., Scale 1-10; mild, moderate, or severe)    - MILD (1-3): Doesn't interfere with normal activities, abdomen soft and not tender to touch.     - MODERATE (4-7): Interferes with normal activities or awakens from sleep, abdomen tender to touch.     - SEVERE (8-10): Excruciating pain, doubled over, unable to do any normal activities.       Moderate  7. RECURRENT  SYMPTOM: "Have you ever had this type of stomach pain before?" If Yes, ask: "When was the last time?" and "What happened that time?"      Yes    I just got over a UTI.    8. CAUSE: "What do you think is causing the stomach pain?"     UTI  9. RELIEVING/AGGRAVATING FACTORS: "What makes it better or worse?" (e.g., antacids, bending or twisting motion, bowel movement)     The antibiotic cleared up the UTI until the pain started back last night. 10. OTHER SYMPTOMS: "Do you have any other symptoms?" (e.g., back pain, diarrhea, fever, urination pain, vomiting)       No  Protocols used: Abdominal Pain - Male-A-AH, Urinary Symptoms-A-AH  Chief Complaint: UTI symptoms have returned after completing last antibiotic last week.  Saw Winda Hastings, NP on 11/28/2023. Symptoms: Left groin pain, some abd pain, urgency and frequency of urination Frequency: Started suddenly again last night the pain in his left groin area Pertinent Negatives: Patient denies  blood in urine, no odor or cloudiness, no flank pain. Disposition: [] ED /[] Urgent Care (no appt availability in office) / [] Appointment(In office/virtual)/ []  [place] [facility]/ [] Home Care/ [] Refused Recommended Disposition /[] [place] [facility]/ [x]  Follow-up with PCP Additional Notes: Message sent to Winda Hastings, NP.    Pt. Uses the [facility] there at [facility].

## 2024-01-01 NOTE — Progress Notes (Signed)
 Urine sample collect. PCP aware of results. Urine sent for complete urinalysis and Culture.

## 2024-01-02 LAB — URINALYSIS, COMPLETE
Bilirubin, UA: NEGATIVE
Ketones, UA: NEGATIVE
Nitrite, UA: NEGATIVE
Specific Gravity, UA: 1.026 (ref 1.005–1.030)
Urobilinogen, Ur: 0.2 mg/dL (ref 0.2–1.0)
pH, UA: 5.5 (ref 5.0–7.5)

## 2024-01-02 LAB — MICROSCOPIC EXAMINATION
Casts: NONE SEEN /LPF
WBC, UA: >30 /HPF — AB (ref 0–5)

## 2024-01-03 ENCOUNTER — Ambulatory Visit: Payer: Self-pay

## 2024-01-03 NOTE — Telephone Encounter (Signed)
 Copied from CRM [id number]. Topic: Clinical - Red Word Triage >> Jan 03, 2024 10:22 AM Leory Rands wrote: Red Word that prompted transfer to Nurse Triage: Patient is calling to report that he is still having pain in his left groin. Pain started Monday Night. U/A 01/01/24. Not resulted. Spoke to NT 01/01/24    Chief Complaint: Mild , aching pain to left testicle, worse with sitting. Comes and goes. Asking to be worked in. Symptoms: Above Frequency: 3 days Pertinent Negatives: Patient denies swelling Disposition: [] ED /[] Urgent Care (no appt availability in office) / [] Appointment(In office/virtual)/ []  [place] [facility]/ [] Home Care/ [] Refused Recommended Disposition /[] [place] [facility]/ [x]  Follow-up with PCP Additional Notes: Please advise pt. Will go to UC/ED for worsening of symptoms. Reason for Disposition  [1] Pain comes and goes (intermittent) AND [2] present > 24 hours  Answer Assessment - Initial Assessment Questions 1. LOCATION and RADIATION: "Where is the pain located?"      Left testicle 2. QUALITY: "What does the pain feel like?"  (e.g., sharp, dull, aching, burning)     Ache 3. SEVERITY: "How bad is the pain?"  (Scale 1-10; or mild, moderate, severe)   - MILD (1-3): doesn't interfere with normal activities    - MODERATE (4-7): interferes with normal activities (e.g., work or school) or awakens from sleep   - SEVERE (8-10): excruciating pain, unable to do any normal activities, difficulty walking     Mild 4. ONSET: "When did the pain start?"     Tuesday 5. PATTERN: "Does it come and go, or has it been constant since it started?"     Comes and goes 6. SCROTAL APPEARANCE: "What does the scrotum look like?" "Is there any swelling or redness?"      No swelling 7. HERNIA: "Has a doctor ever told you that you have a hernia?"     No 8. OTHER SYMPTOMS: "Do you have any other symptoms?" (e.g., abdomen pain, difficulty passing urine, fever, vomiting)     No  Protocols  used: Scrotum Pain-A-AH

## 2024-01-03 NOTE — Telephone Encounter (Signed)
 Returned pt call. Made pt aware that our office is closed today but because of the pain he is having I have recommended the ED or Urgent Care but I will be sending  a message to his provider for FYI. Pt states he is taking aleve for the pain and it does seem to be helping. Pt states if the pain gets worse he will go to the ED or Urgent care.   Rashiema could we schedule pt an appt with any available provider

## 2024-01-04 ENCOUNTER — Other Ambulatory Visit: Payer: Self-pay | Admitting: Nurse Practitioner

## 2024-01-04 ENCOUNTER — Other Ambulatory Visit (HOSPITAL_BASED_OUTPATIENT_CLINIC_OR_DEPARTMENT_OTHER): Payer: Self-pay

## 2024-01-04 DIAGNOSIS — R972 Elevated prostate specific antigen [PSA]: Secondary | ICD-10-CM

## 2024-01-04 DIAGNOSIS — R399 Unspecified symptoms and signs involving the genitourinary system: Secondary | ICD-10-CM

## 2024-01-04 LAB — URINE CULTURE

## 2024-01-04 MED ORDER — NITROFURANTOIN MONOHYD MACRO 100 MG PO CAPS
100.0000 mg | ORAL_CAPSULE | Freq: Two times a day (BID) | ORAL | 0 refills | Status: AC
  Filled 2024-01-04: qty 14, 7d supply, fill #0

## 2024-01-04 NOTE — Telephone Encounter (Signed)
 Antibiotic sent to pharmacy. Also referred to UROLOGY for elevated PSA

## 2024-01-06 ENCOUNTER — Other Ambulatory Visit: Payer: Self-pay | Admitting: Nurse Practitioner

## 2024-01-06 ENCOUNTER — Telehealth (HOSPITAL_COMMUNITY): Payer: Self-pay | Admitting: Licensed Clinical Social Worker

## 2024-01-06 DIAGNOSIS — N3001 Acute cystitis with hematuria: Secondary | ICD-10-CM

## 2024-01-06 DIAGNOSIS — R972 Elevated prostate specific antigen [PSA]: Secondary | ICD-10-CM

## 2024-01-06 NOTE — Telephone Encounter (Signed)
 Patient called requesting assistance with transportation to appointment at the end of the week. CSW provided contact information for insurance transportation. Patient will call and arrange for the end of the week. CSW encouraged patient to return call to CSW if unable to make contact with insurance. Patient verbalizes understanding. Aubry Blase, LCSW, CCSW-MCS [phone]

## 2024-01-06 NOTE — Telephone Encounter (Signed)
 Patient identified by name and date of birth.  Aware of response from provider.

## 2024-01-07 ENCOUNTER — Other Ambulatory Visit (HOSPITAL_COMMUNITY): Payer: Self-pay | Admitting: *Deleted

## 2024-01-07 ENCOUNTER — Telehealth (HOSPITAL_COMMUNITY): Payer: Self-pay | Admitting: Licensed Clinical Social Worker

## 2024-01-07 NOTE — Telephone Encounter (Signed)
 H&V Care Navigation CSW Progress Note  Clinical Social Worker contacted patient by phone to assist with transportation..  Patient is participating in a Managed Medicaid Plan:  Yes  Patient called to share that he was unable to obtain transportation through the managed medicaid as his phone number has changed and is different than the original number he signed up for insurance. Therefore he because he has a different number they would not provide transportation services. Patient very frustrated. CSW will provide taxi ride to avoid missing an appointment.  Patient grateful and ride arranged with [employer] taxi. Aubry Blase, LCSW, CCSW-MCS [phone]   SDOH Screenings   Food Insecurity: No Food Insecurity (11/25/2023)  Housing: Low Risk  (11/25/2023)  Transportation Needs: Unmet Transportation Needs (01/07/2024)  Utilities: Not At Risk (12/04/2022)  Alcohol Screen: Low Risk  (12/05/2022)  Depression (PHQ2-9): High Risk (11/26/2023)  Financial Resource Strain: High Risk (11/25/2023)  Physical Activity: Inactive (11/25/2023)  Social Connections: Socially Isolated (11/25/2023)  Stress: Stress Concern Present (11/25/2023)  Tobacco Use: Low Risk  (12/20/2023)  Health Literacy: Adequate Health Literacy (08/26/2023)

## 2024-01-09 ENCOUNTER — Encounter (HOSPITAL_COMMUNITY): Payer: Self-pay

## 2024-01-09 ENCOUNTER — Other Ambulatory Visit: Payer: Self-pay

## 2024-01-09 ENCOUNTER — Ambulatory Visit (HOSPITAL_COMMUNITY)
Admission: RE | Admit: 2024-01-09 | Discharge: 2024-01-09 | Disposition: A | Discharge status: Home / Self Care | Source: Ambulatory Visit | Attending: Cardiology | Admitting: Cardiology

## 2024-01-09 ENCOUNTER — Ambulatory Visit (HOSPITAL_BASED_OUTPATIENT_CLINIC_OR_DEPARTMENT_OTHER)
Admission: RE | Admit: 2024-01-09 | Discharge: 2024-01-09 | Disposition: A | Discharge status: Home / Self Care | Source: Ambulatory Visit | Attending: Cardiology | Admitting: Cardiology

## 2024-01-09 ENCOUNTER — Inpatient Hospital Stay (HOSPITAL_COMMUNITY)
Admission: RE | Admit: 2024-01-09 | Discharge: 2024-01-09 | Disposition: A | Discharge status: Home / Self Care | Source: Ambulatory Visit | Attending: Internal Medicine | Admitting: Internal Medicine

## 2024-01-09 ENCOUNTER — Other Ambulatory Visit (HOSPITAL_COMMUNITY): Payer: Self-pay | Admitting: Internal Medicine

## 2024-01-09 ENCOUNTER — Ambulatory Visit (HOSPITAL_COMMUNITY)
Admission: RE | Admit: 2024-01-09 | Source: Ambulatory Visit | Attending: Internal Medicine | Admitting: Internal Medicine

## 2024-01-09 VITALS — BP 114/74 | HR 110 | Wt 226.0 lb

## 2024-01-09 DIAGNOSIS — I5022 Chronic systolic (congestive) heart failure: Secondary | ICD-10-CM | POA: Diagnosis not present

## 2024-01-09 DIAGNOSIS — N183 Chronic kidney disease, stage 3 unspecified: Secondary | ICD-10-CM | POA: Diagnosis not present

## 2024-01-09 DIAGNOSIS — I10 Essential (primary) hypertension: Secondary | ICD-10-CM | POA: Diagnosis not present

## 2024-01-09 DIAGNOSIS — E785 Hyperlipidemia, unspecified: Secondary | ICD-10-CM | POA: Diagnosis not present

## 2024-01-09 DIAGNOSIS — I4891 Unspecified atrial fibrillation: Secondary | ICD-10-CM

## 2024-01-09 DIAGNOSIS — N1831 Chronic kidney disease, stage 3a: Secondary | ICD-10-CM

## 2024-01-09 DIAGNOSIS — I5020 Unspecified systolic (congestive) heart failure: Secondary | ICD-10-CM | POA: Diagnosis present

## 2024-01-09 DIAGNOSIS — E1122 Type 2 diabetes mellitus with diabetic chronic kidney disease: Secondary | ICD-10-CM | POA: Insufficient documentation

## 2024-01-09 DIAGNOSIS — I480 Paroxysmal atrial fibrillation: Secondary | ICD-10-CM | POA: Insufficient documentation

## 2024-01-09 DIAGNOSIS — Z5901 Sheltered homelessness: Secondary | ICD-10-CM | POA: Diagnosis not present

## 2024-01-09 DIAGNOSIS — Z7984 Long term (current) use of oral hypoglycemic drugs: Secondary | ICD-10-CM | POA: Diagnosis not present

## 2024-01-09 DIAGNOSIS — I130 Hypertensive heart and chronic kidney disease with heart failure and stage 1 through stage 4 chronic kidney disease, or unspecified chronic kidney disease: Secondary | ICD-10-CM | POA: Diagnosis not present

## 2024-01-09 DIAGNOSIS — Z5982 Transportation insecurity: Secondary | ICD-10-CM | POA: Diagnosis not present

## 2024-01-09 DIAGNOSIS — I110 Hypertensive heart disease with heart failure: Secondary | ICD-10-CM | POA: Diagnosis present

## 2024-01-09 LAB — BASIC METABOLIC PANEL WITH GFR
Anion gap: 9 (ref 5–15)
BUN: 13 mg/dL (ref 8–23)
CO2: 22 mmol/L (ref 22–32)
Calcium: 8.9 mg/dL (ref 8.9–10.3)
Chloride: 108 mmol/L (ref 98–111)
Creatinine, Ser: 1.03 mg/dL (ref 0.61–1.24)
GFR, Estimated: >60 mL/min (ref >60)
Glucose, Bld: 141 mg/dL — ABNORMAL HIGH (ref 70–99)
Potassium: 3.9 mmol/L (ref 3.5–5.1)
Sodium: 139 mmol/L (ref 135–145)

## 2024-01-09 LAB — BRAIN NATRIURETIC PEPTIDE: B Natriuretic Peptide: 13.9 pg/mL (ref 0.0–100.0)

## 2024-01-09 MED ORDER — SPIRONOLACTONE 25 MG PO TABS
12.5000 mg | ORAL_TABLET | Freq: Every day | ORAL | 3 refills | Status: DC
  Filled 2024-01-09: qty 45, 90d supply, fill #0

## 2024-01-09 MED ORDER — AMIODARONE HCL 200 MG PO TABS
200.0000 mg | ORAL_TABLET | Freq: Two times a day (BID) | ORAL | 3 refills | Status: DC
  Filled 2024-01-09: qty 60, 30d supply, fill #0

## 2024-01-09 MED ORDER — FUROSEMIDE 40 MG PO TABS
40.0000 mg | ORAL_TABLET | Freq: Every day | ORAL | 3 refills | Status: DC
  Filled 2024-01-09: qty 90, 90d supply, fill #0

## 2024-01-09 NOTE — Progress Notes (Signed)
 ADVANCED HF CLINIC NOTE  Primary Care: Winda Hastings, NP Primary Cardiologist: Dr. Renna Cary HF Cardiologist: Dr. Julane Ny  Reason for Visit: Heart Failure Follow-up HPI: 63 y.o. male with history of HFrEF, atrial fibrillation (diagnosed 12/23), HTN, HLD, DM and systolic HF  Patient admitted 16/10/96 with new afib with RVR. Treated with rate control. 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 Echo demonstrated EF 45-50%, RV okay, severe LAE. Plan was for cardioversion at a later date but cancelled the procedure d/t passing of his wife. He later missed doses of eliquis  and ran out of metoprolol  and farxiga . He was admitted with Afib with RVR and a/c CHF in March 2024. EF down slightly further to 35-40%. He was diuresed and GDMT titrated. He underwent successful DCCV during the admission.  We saw him in [facility] on 01/04/23 and was back in AF. Started on amio and underwent outpatient TEE/DCVV on 01/15/23.   TEE 4/24 EF 30% severe LAE. Mild MR  Admitted 6/24 from AHF clinic with AF with RVR and a/c dHF. Started on amiodarone  gtt, diuresed and underwent DCCV to NSR.  He returns today for heart failure follow up with his son. Overall feeling poor. NYHA IIIb. Reports dyspnea, fatigue, palpitations, and paroxysmal nocturnal dyspnea. Has been having increased dyspnea with exertion as well as conversation for the past month. Intermittently with palpitations. Has trouble sleeping lately related to PND. Denies chest pain, near-syncope, orthopnea, dizziness, and abnormal bleeding. Able to perform ADLs. Appetite okay. Weight at home stable. Compliant with all medications.  Cardiac Studies - TEE 4/24: EF 30%, severe LAE, mild MR - Echo 3/24: EF 35-40% - Echo 12/23: EF 45%, normal RV  Past Medical History:  Diagnosis Date   A-fib (HCC)    Asthma    Diabetes mellitus without complication (HCC)    GERD (gastroesophageal reflux disease)    Hyperlipidemia    Hypertension     Current Outpatient Medications  Medication Sig Dispense Refill   albuterol  (VENTOLIN  HFA) 108 (90 Base) MCG/ACT inhaler Inhale 1-2 puffs into the lungs every 6 (six) hours as needed for wheezing or shortness of breath. 18 g 2   amiodarone  (PACERONE ) 200 MG tablet Take 1 tablet (200 mg total) by mouth daily. 90 tablet 3   apixaban  (ELIQUIS ) 5 MG TABS tablet Take 1 tablet (5 mg total) by mouth 2 (two) times daily. 60 tablet 5   Aspirin-Salicylamide-Caffeine (BC HEADACHE POWDER PO) Take 1 packet by mouth daily as needed (headache, pain).     empagliflozin  (JARDIANCE ) 10 MG TABS tablet Take 1 tablet (10 mg total) by mouth daily. 90 tablet 3   furosemide  (LASIX ) 40 MG tablet Take 1 tablet (40 mg total) by mouth daily as needed. For 3 lb weight gain overnight or 5 lbs in one week 90 tablet 1   metFORMIN  (GLUCOPHAGE ) 500 MG tablet Take 1 tablet (500 mg total) by mouth 2 (two) times daily with a meal. (Patient taking differently: Take 500 mg by mouth 2 (two) times daily with a meal. Daily) 60 tablet 4   metoprolol  succinate (TOPROL  XL) 50 MG 24 hr tablet Take 1 tablet (50 mg total) by mouth daily. Take with or immediately following a meal. 90 tablet 1   omeprazole  (PRILOSEC) 20 MG capsule Take 1 capsule (20 mg total) by mouth daily. FOR ACID REFLUX (Patient taking differently: Take 20 mg by mouth daily. FOR ACID REFLUX AS NEEDED) 90 capsule 1   sacubitril -valsartan  (ENTRESTO ) 24-26 MG Take 1 tablet  by mouth 2 (two) times daily. 60 tablet 11   Vitamin D , Ergocalciferol , (DRISDOL ) 1.25 MG (50000 UNIT) CAPS capsule Take 1 capsule (50,000 Units total) by mouth once a week. (Patient taking differently: Take 50,000 Units by mouth once a week. Takes on Tuesdays) 12 capsule 0   nitrofurantoin , macrocrystal-monohydrate, (MACROBID ) 100 MG capsule Take 1 capsule (100 mg total) by mouth 2 (two) times daily for 7 days. 14 capsule 0   No current facility-administered medications for this encounter.   No Known  Allergies  Social History   Socioeconomic History   Marital status: Widowed    Spouse name: Not on file   Number of children: 1   Years of education: Not on file   Highest education level: 12th grade  Occupational History   Occupation: [employer]  Tobacco Use   Smoking status: Never   Smokeless tobacco: Never   Tobacco comments:    Never smoke 10/08/22  Vaping Use   Vaping status: Never Used  Substance and Sexual Activity   Alcohol use: Never   Drug use: Never   Sexual activity: Not Currently  Other Topics Concern   Not on file  Social History Narrative   Not on file   Social Drivers of Health   Financial Resource Strain: High Risk (11/25/2023)   Overall Financial Resource Strain (CARDIA)    Difficulty of Paying Living Expenses: Hard  Food Insecurity: No Food Insecurity (11/25/2023)   Hunger Vital Sign    Worried About Running Out of Food in the Last Year: Never true    Ran Out of Food in the Last Year: Never true  Transportation Needs: Unmet Transportation Needs (01/07/2024)   PRAPARE - Transportation    Lack of Transportation (Medical): Yes    Lack of Transportation (Non-Medical): Yes  Physical Activity: Inactive (11/25/2023)   Exercise Vital Sign    Days of Exercise per Week: 0 days    Minutes of Exercise per Session: 0 min  Stress: Stress Concern Present (11/25/2023)   [employer] of Occupational Health - Occupational Stress Questionnaire    Feeling of Stress : Very much  Social Connections: Socially Isolated (11/25/2023)   Social Connection and Isolation Panel [NHANES]    Frequency of Communication with Friends and Family: Once a week    Frequency of Social Gatherings with Friends and Family: Never    Attends Religious Services: Never    Database administrator or Organizations: No    Attends Engineer, structural: Not on file    Marital Status: Widowed  Intimate Partner Violence: Not At Risk (12/04/2022)   Humiliation, Afraid, Rape, and Kick  questionnaire    Fear of Current or Ex-Partner: No    Emotionally Abused: No    Physically Abused: No    Sexually Abused: No   No family history on file.  Filed Weights   01/09/24 0908  Weight: 102.5 kg (226 lb)   Vitals:   01/09/24 0908  BP: 114/74  Pulse: (!) 110  SpO2: 98%   PHYSICAL EXAM: General: Well appearing. No distress on RA Cardiac: JVP difficult to assess. S1 and S2 present. No murmurs or rub. Resp: Coarse upper lobes, diminished bases. Low lung volumes.  Abdomen: Taut, non-tender, distended.  Extremities: Warm and dry.  No peripheral edema.  Neuro: Alert and oriented x3. Affect pleasant. Moves all extremities without difficulty.  ECG (personally reviewed): ST 110 bpm, quality low r/t Barostim  ReDs reading: 36 %, abnormal    ASSESSMENT &  PLAN: 1. PAF with RVR - Successful DCCV 3/24, 4/24 & 6/24 - Reports intermittent palpitations and increased SOB, suspect in/out AF - ECG in ST on exam - Increase amiodarone  to 200 mg bid - Continue Eliquis  5 mg bid, discussed need for compliance - Long term would aim for eventual ablation, refer to EP if having AF recurrence on Zio - Order placed for sleep study, patient agreeable - Place Zio monitor   2. Chronic systolic HF - Suspect possibly tachy-mediated. Discovered in setting of Afib with RVR in 08/2022.  - Echo 12/23: EF 45-50%, RV okay, severe LAE. No prior echo for comparison. - Echo 3/24 in setting of Afib with RVR: EF 35-40% - TEE 4/24 EF 30% - NYHA IIIb (worse from last visit). Volume up by Reds and exam - Continue Entresto  24/26 mg bid - Increase Lasix  40 mg from EOD to daily - Continue Toprol  XL 50 mg daily - Continue Jardiance  10 mg daily - Restart Spiro 12.5 mg daily - Repeat echo in 3 months once back on GDMT.   3. HTN - BP stable - GDMT as above - Sleep study ordered   4. HLD - Continue atorvastatin .    5. CKD: Stage 3 - Baseline Scr ~1.5.  - Continue SGLT2i  6. SDOH - Insured - Has  transportation needs - Lives in hotel with autistic son - Engage HFSW for resources  Follow up in 4 weeks with APP to assess Zio and 3 months with Dr .Julane Ny + echo  [place] Kearie Mennen, NP 01/09/24

## 2024-01-09 NOTE — Progress Notes (Signed)
 Zio patch placed onto patient.  All instructions and information reviewed with patient, they verbalize understanding with no questions.

## 2024-01-09 NOTE — Patient Instructions (Signed)
 Medication Changes:  INCREASE AMIODARONE  TO 200MG  TWICE DAILY   START: SPIRONOLACTONE  12.5MG  ONCE DAILY   TAKE YOUR FUROSEMIDE  (LASIX ) ONCE DAILY   Lab Work:  Labs done today, your results will be available in MyChart, we will contact you for abnormal readings.  Testing/Procedures:  Your provider has recommended that  you wear a Zio Patch for 14 days.  This monitor will record your heart rhythm for our review.  IF you have any symptoms while wearing the monitor please press the button.  If you have any issues with the patch or you notice a red or orange light on it please call the company at [phone].  Once you remove the patch please mail it back to the company as soon as possible so we can get the results.   SOMEONE WILL REACH OUT TO YOU TO GET THIS SCHEDULED ONCE APPROVED WITH INSURANCE Your physician has recommended that you have a sleep study. This test records several body functions during sleep, including: brain activity, eye movement, oxygen and carbon dioxide blood levels, heart rate and rhythm, breathing rate and rhythm, the flow of air through your mouth and nose, snoring, body muscle movements, and chest and belly movement.  Follow-Up in: 4 WEEKS AS SCHEDULED   At the [facility], you and your health needs are our priority. We have a designated team specialized in the treatment of Heart Failure. This Care Team includes your primary Heart Failure Specialized Cardiologist (physician), Advanced Practice Providers (APPs- Physician Assistants and Nurse Practitioners), and Pharmacist who all work together to provide you with the care you need, when you need it.   You may see any of the following providers on your designated Care Team at your next follow up:  Dr. Jules Oar Dr. Peder Bourdon Dr. Alwin Baars Dr. Judyth Nunnery Nieves Bars, NP Ruddy Corral, [place] [facility] [place], [place] Dennise Fitz, NP [place] Lee, NP Luster Salters,  PharmD   Please be sure to bring in all your medications bottles to every appointment.   Need to Contact Us :  If you have any questions or concerns before your next appointment please send us  a message through [place] or call our office at [phone].    TO LEAVE A MESSAGE FOR THE NURSE SELECT OPTION 2, PLEASE LEAVE A MESSAGE INCLUDING: YOUR NAME DATE OF BIRTH CALL BACK NUMBER REASON FOR CALL**this is important as we prioritize the call backs  YOU WILL RECEIVE A CALL BACK THE SAME DAY AS LONG AS YOU CALL BEFORE 4:00 PM

## 2024-01-13 ENCOUNTER — Encounter: Payer: Self-pay | Admitting: Gastroenterology

## 2024-01-14 ENCOUNTER — Telehealth (HOSPITAL_COMMUNITY): Payer: Self-pay

## 2024-01-14 NOTE — Telephone Encounter (Signed)
 Called patient to see if he was interested in participating in the Cardiac Rehab Program. Patient will come in for orientation on 5/6@1030  and will attend the 8:15 exercise class.  Gave pt information over the phone about the program.

## 2024-01-21 ENCOUNTER — Other Ambulatory Visit: Payer: Self-pay

## 2024-01-21 ENCOUNTER — Ambulatory Visit (HOSPITAL_COMMUNITY)

## 2024-01-21 ENCOUNTER — Other Ambulatory Visit (HOSPITAL_COMMUNITY): Payer: Self-pay

## 2024-01-23 ENCOUNTER — Other Ambulatory Visit: Payer: Self-pay

## 2024-01-27 ENCOUNTER — Ambulatory Visit (HOSPITAL_COMMUNITY)

## 2024-01-29 ENCOUNTER — Ambulatory Visit (HOSPITAL_COMMUNITY): Payer: Self-pay

## 2024-01-29 ENCOUNTER — Ambulatory Visit (HOSPITAL_COMMUNITY)

## 2024-01-29 NOTE — Addendum Note (Signed)
 Encounter addended by: Stan Eans, RN on: 01/29/2024 4:51 PM  Actions taken: Imaging Exam ended

## 2024-01-31 ENCOUNTER — Ambulatory Visit (HOSPITAL_COMMUNITY)

## 2024-01-31 ENCOUNTER — Other Ambulatory Visit: Payer: Self-pay

## 2024-02-03 ENCOUNTER — Ambulatory Visit (HOSPITAL_COMMUNITY)

## 2024-02-05 ENCOUNTER — Telehealth (HOSPITAL_COMMUNITY): Payer: Self-pay

## 2024-02-05 ENCOUNTER — Ambulatory Visit (HOSPITAL_COMMUNITY)

## 2024-02-05 NOTE — Telephone Encounter (Signed)
 Called to confirm/remind patient of their appointment at the [facility] on 02/06/24.   Appointment:   [x] Confirmed  [] Left mess   [] No answer/No voice mail  [] VM Full/unable to leave message  [] Phone not in service  Patient reminded to bring all medications and/or complete list.  Confirmed patient has transportation. Gave directions, instructed to utilize valet parking.

## 2024-02-06 ENCOUNTER — Other Ambulatory Visit: Payer: Self-pay

## 2024-02-06 ENCOUNTER — Ambulatory Visit (HOSPITAL_COMMUNITY)
Admission: RE | Admit: 2024-02-06 | Discharge: 2024-02-06 | Disposition: A | Discharge status: Home / Self Care | Source: Ambulatory Visit | Attending: Adult Health | Admitting: Adult Health

## 2024-02-06 VITALS — BP 98/64 | HR 84 | Wt 225.0 lb

## 2024-02-06 DIAGNOSIS — I429 Cardiomyopathy, unspecified: Secondary | ICD-10-CM | POA: Insufficient documentation

## 2024-02-06 DIAGNOSIS — Z5901 Sheltered homelessness: Secondary | ICD-10-CM | POA: Diagnosis not present

## 2024-02-06 DIAGNOSIS — Z79899 Other long term (current) drug therapy: Secondary | ICD-10-CM | POA: Diagnosis not present

## 2024-02-06 DIAGNOSIS — N1831 Chronic kidney disease, stage 3a: Secondary | ICD-10-CM | POA: Diagnosis not present

## 2024-02-06 DIAGNOSIS — Z7984 Long term (current) use of oral hypoglycemic drugs: Secondary | ICD-10-CM | POA: Insufficient documentation

## 2024-02-06 DIAGNOSIS — I480 Paroxysmal atrial fibrillation: Secondary | ICD-10-CM

## 2024-02-06 DIAGNOSIS — Z5982 Transportation insecurity: Secondary | ICD-10-CM | POA: Diagnosis not present

## 2024-02-06 DIAGNOSIS — I130 Hypertensive heart and chronic kidney disease with heart failure and stage 1 through stage 4 chronic kidney disease, or unspecified chronic kidney disease: Secondary | ICD-10-CM | POA: Diagnosis not present

## 2024-02-06 DIAGNOSIS — E1122 Type 2 diabetes mellitus with diabetic chronic kidney disease: Secondary | ICD-10-CM | POA: Insufficient documentation

## 2024-02-06 DIAGNOSIS — E785 Hyperlipidemia, unspecified: Secondary | ICD-10-CM | POA: Insufficient documentation

## 2024-02-06 DIAGNOSIS — N183 Chronic kidney disease, stage 3 unspecified: Secondary | ICD-10-CM | POA: Diagnosis not present

## 2024-02-06 DIAGNOSIS — I5022 Chronic systolic (congestive) heart failure: Secondary | ICD-10-CM | POA: Diagnosis not present

## 2024-02-06 DIAGNOSIS — I5020 Unspecified systolic (congestive) heart failure: Secondary | ICD-10-CM

## 2024-02-06 DIAGNOSIS — Z7901 Long term (current) use of anticoagulants: Secondary | ICD-10-CM | POA: Diagnosis not present

## 2024-02-06 LAB — BASIC METABOLIC PANEL WITH GFR
Anion gap: 9 (ref 5–15)
BUN: 11 mg/dL (ref 8–23)
CO2: 24 mmol/L (ref 22–32)
Calcium: 8.9 mg/dL (ref 8.9–10.3)
Chloride: 105 mmol/L (ref 98–111)
Creatinine, Ser: 1.14 mg/dL (ref 0.61–1.24)
GFR, Estimated: >60 mL/min (ref >60)
Glucose, Bld: 142 mg/dL — ABNORMAL HIGH (ref 70–99)
Potassium: 4.5 mmol/L (ref 3.5–5.1)
Sodium: 138 mmol/L (ref 135–145)

## 2024-02-06 MED ORDER — FUROSEMIDE 40 MG PO TABS: 40.0000 mg | ORAL_TABLET | ORAL | Status: DC

## 2024-02-06 MED ORDER — AMIODARONE HCL 200 MG PO TABS
200.0000 mg | ORAL_TABLET | Freq: Every day | ORAL | 3 refills | Status: DC
  Filled 2024-02-06 – 2024-02-26 (×2): qty 30, 30d supply, fill #0

## 2024-02-06 NOTE — Patient Instructions (Signed)
 Medication Change   DECREASE AMIODARONE  TO 200MG  ONCE DAILY   TAKE LASIX  (FUROSEMIDE ) DOSE EVERY OTHER DAY   Lab Work:  Labs done today, your results will be available in MyChart, we will contact you for abnormal readings.  Follow-Up in: 1 MONTH AS SCHEDULED WITH PHARMACY   AND THEN 2 MONTHS WITH DR. Julane Ny WITH AN ECHO AS SCHEDULED   At the [facility], you and your health needs are our priority. We have a designated team specialized in the treatment of Heart Failure. This Care Team includes your primary Heart Failure Specialized Cardiologist (physician), Advanced Practice Providers (APPs- Physician Assistants and Nurse Practitioners), and Pharmacist who all work together to provide you with the care you need, when you need it.   You may see any of the following providers on your designated Care Team at your next follow up:  Dr. Jules Oar Dr. Peder Bourdon Dr. Alwin Baars Dr. Judyth Nunnery Nieves Bars, NP Ruddy Corral, [place] [facility] [place], [place] Dennise Fitz, NP [place] Lee, NP Luster Salters, PharmD   Please be sure to bring in all your medications bottles to every appointment.   Need to Contact Us :  If you have any questions or concerns before your next appointment please send us  a message through [place] or call our office at [phone].    TO LEAVE A MESSAGE FOR THE NURSE SELECT OPTION 2, PLEASE LEAVE A MESSAGE INCLUDING: YOUR NAME DATE OF BIRTH CALL BACK NUMBER REASON FOR CALL**this is important as we prioritize the call backs  YOU WILL RECEIVE A CALL BACK THE SAME DAY AS LONG AS YOU CALL BEFORE 4:00 PM

## 2024-02-06 NOTE — Progress Notes (Signed)
 ReDS Vest / Clip - 02/06/24 0900       ReDS Vest / Clip   Station Marker D    Ruler Value 33    ReDS Value Range Low volume    ReDS Actual Value 27

## 2024-02-06 NOTE — Progress Notes (Signed)
 ADVANCED HF CLINIC NOTE  Primary Care: Winda Hastings, NP Primary Cardiologist: Dr. Renna Cary HF Cardiologist: Dr. Julane Ny  Reason for Visit: Heart Failure  HPI: Mr Banton is a 63 y.o. male with history of HFrEF, PAF(diagnosed 12/23), HTN, HLD, DM, NSVT. and chronic HFrEF.   Patient admitted 09/14/22 with new afib with RVR. Treated with rate control. 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 Echo demonstrated EF 45-50%, RV okay, severe LAE. Plan was for cardioversion at a later date but cancelled the procedure d/t passing of his wife. He later missed doses of eliquis  and ran out of metoprolol  and farxiga . He was admitted with Afib with RVR and a/c CHF in March 2024. EF down slightly further to 35-40%. He was diuresed and GDMT titrated. He underwent successful DCCV during the admission.   TOC Clinic 01/04/23 and was back in AF. Started on amio and underwent outpatient TEE/DCVV on 01/15/23.   Admitted 6/24 from AHF clinic with AF with RVR and a/c dHF. Started on amiodarone  gtt, diuresed and underwent DCCV to NSR.  Today he returns for HF follow up.Overall feeling fine.  Reporting photo sensitivity when he walks outside. Goes away when he goes back inside. Yesterday he a couple dizzy episodes but he tells me he has dizzy episodes all his life. He says dizzy episodes are brief. SOB with exertion. Able walk around the store but he tries to slow down.  Denies PND/Orthopnea. Appetite ok. No fever or chills. Weight at home 220 pounds. Taking all medications. Lives with his son (high functioning Autism) in a Motel. He gets help with transportation. Trying to get disability.   Cardiac Studies - TEE 4/24: EF 30%, severe LAE, mild MR - Echo 3/24: EF 35-40% - Echo 12/23: EF 45%, normal RV  -Zio 01/2024   Predominant underlying rhythm was Sinus Rhythm. 1 run of Ventricular Tachycardia occurred lasting 4 beats with a max rate of 194 bpm (avg 172 bpm). 9 Supraventricular Tachycardia  runs occurred, the run with the fastest interval lasting 4 beats with a max rate of 145 bpm, the longest lasting 5 beats with an avg rate of 108 bpm. Isolated SVEs were rare (<1.0%), SVE Couplets were rare (<1.0%), and SVE Triplets were rare (<1.0%).  Isolated VEs were rare (<1.0%), VE Couplets were rare (<1.0%), and no VE Triplets were present.   Past Medical History:  Diagnosis Date   A-fib (HCC)    Asthma    Diabetes mellitus without complication (HCC)    GERD (gastroesophageal reflux disease)    Hyperlipidemia    Hypertension    Current Outpatient Medications  Medication Sig Dispense Refill   albuterol  (VENTOLIN  HFA) 108 (90 Base) MCG/ACT inhaler Inhale 1-2 puffs into the lungs every 6 (six) hours as needed for wheezing or shortness of breath. 18 g 2   amiodarone  (PACERONE ) 200 MG tablet Take 1 tablet (200 mg total) by mouth daily. 90 tablet 3   apixaban  (ELIQUIS ) 5 MG TABS tablet Take 1 tablet (5 mg total) by mouth 2 (two) times daily. 60 tablet 5   Aspirin-Salicylamide-Caffeine (BC HEADACHE POWDER PO) Take 1 packet by mouth daily as needed (headache, pain).     empagliflozin  (JARDIANCE ) 10 MG TABS tablet Take 1 tablet (10 mg total) by mouth daily. 90 tablet 3   furosemide  (LASIX ) 40 MG tablet Take 1 tablet (40 mg total) by mouth daily as needed. For 3 lb weight gain overnight or 5 lbs in one week 90 tablet 1  metFORMIN  (GLUCOPHAGE ) 500 MG tablet Take 1 tablet (500 mg total) by mouth 2 (two) times daily with a meal. (Patient taking differently: Take 500 mg by mouth 2 (two) times daily with a meal. Daily) 60 tablet 4   metoprolol  succinate (TOPROL  XL) 50 MG 24 hr tablet Take 1 tablet (50 mg total) by mouth daily. Take with or immediately following a meal. 90 tablet 1   omeprazole  (PRILOSEC) 20 MG capsule Take 1 capsule (20 mg total) by mouth daily. FOR ACID REFLUX (Patient taking differently: Take 20 mg by mouth daily. FOR ACID REFLUX AS NEEDED) 90 capsule 1   sacubitril -valsartan   (ENTRESTO ) 24-26 MG Take 1 tablet by mouth 2 (two) times daily. 60 tablet 11   Vitamin D , Ergocalciferol , (DRISDOL ) 1.25 MG (50000 UNIT) CAPS capsule Take 1 capsule (50,000 Units total) by mouth once a week. (Patient taking differently: Take 50,000 Units by mouth once a week. Takes on Tuesdays) 12 capsule 0   nitrofurantoin , macrocrystal-monohydrate, (MACROBID ) 100 MG capsule Take 1 capsule (100 mg total) by mouth 2 (two) times daily for 7 days. 14 capsule 0   No current facility-administered medications for this encounter.   No Known Allergies  Social History   Socioeconomic History   Marital status: Widowed    Spouse name: Not on file   Number of children: 1   Years of education: Not on file   Highest education level: 12th grade  Occupational History   Occupation: [employer]  Tobacco Use   Smoking status: Never   Smokeless tobacco: Never   Tobacco comments:    Never smoke 10/08/22  Vaping Use   Vaping status: Never Used  Substance and Sexual Activity   Alcohol use: Never   Drug use: Never   Sexual activity: Not Currently  Other Topics Concern   Not on file  Social History Narrative   Not on file   Social Drivers of Health   Financial Resource Strain: High Risk (11/25/2023)   Overall Financial Resource Strain (CARDIA)    Difficulty of Paying Living Expenses: Hard  Food Insecurity: No Food Insecurity (11/25/2023)   Hunger Vital Sign    Worried About Running Out of Food in the Last Year: Never true    Ran Out of Food in the Last Year: Never true  Transportation Needs: Unmet Transportation Needs (01/07/2024)   PRAPARE - Transportation    Lack of Transportation (Medical): Yes    Lack of Transportation (Non-Medical): Yes  Physical Activity: Inactive (11/25/2023)   Exercise Vital Sign    Days of Exercise per Week: 0 days    Minutes of Exercise per Session: 0 min  Stress: Stress Concern Present (11/25/2023)   [employer] of Occupational Health - Occupational Stress  Questionnaire    Feeling of Stress : Very much  Social Connections: Socially Isolated (11/25/2023)   Social Connection and Isolation Panel [NHANES]    Frequency of Communication with Friends and Family: Once a week    Frequency of Social Gatherings with Friends and Family: Never    Attends Religious Services: Never    Database administrator or Organizations: No    Attends Engineer, structural: Not on file    Marital Status: Widowed  Intimate Partner Violence: Not At Risk (12/04/2022)   Humiliation, Afraid, Rape, and Kick questionnaire    Fear of Current or Ex-Partner: No    Emotionally Abused: No    Physically Abused: No    Sexually Abused: No   No family  history on file.  Filed Weights   02/06/24 0921  Weight: 102.1 kg (225 lb)    Vitals:   02/06/24 0921  BP: 98/64  Pulse: 84  SpO2: 98%   Wt Readings from Last 3 Encounters:  02/06/24 102.1 kg (225 lb)  01/09/24 102.5 kg (226 lb)  12/20/23 103 kg (227 lb)    PHYSICAL EXAM: General:   No resp difficulty Neck: supple. no JVD.  Cor: PMI nondisplaced. Regular rate & rhythm. No rubs, gallops or murmurs. Lungs: clear Abdomen: soft, nontender, nondistended.  Extremities: no cyanosis, clubbing, rash, edema Neuro: alert & oriented x3  ReDs reading: 27 %, normal  EKG: SR 81 bpm     ASSESSMENT & PLAN: 1. PAF  - Successful DCCV 3/24, 4/24 & 6/24 -EKG today- NSR.  - Cut back amio to 200 mg daily due to concern amio may be causing photosensitivity.  - Continue Eliquis  5 mg bid - Long term would aim for eventual ablation, refer to EP if having AF recurrence on Zio - Order placed for sleep study, patient agreeable   2. Chronic systolic HF - Suspect possibly tachy-mediated. Discovered in setting of Afib with RVR in 08/2022.  - Echo 12/23: EF 45-50%, RV okay, severe LAE. No prior echo for comparison. - Echo 3/24 in setting of Afib with RVR: EF 35-40% - TEE 4/24 EF 30% - NYHA III. Volume status looks a little low.  Cut back lasix  to every other day.  -Continue Entresto  24/26 mg bid -  Continue Toprol  XL 50 mg daily - Continue Jardiance  10 mg daily - Continue Spiro 12.5 mg daily - Check BMET  - Plan to repeat Echo on HF meds in 2 months.    3. HTN - BP is a low. No med titration.  - Sleep study ordered   4. HLD - Continue atorvastatin .    5. CKD: Stage 3 - Baseline Scr ~1.5.  - Continue SGLT2i -Check BMET   6. SDOH - Insured - Has transportation needs - Lives in hotel with autistic son   Follow up in 4 weeks with pharmacy  for med titration. and 2 months with Dr Bensimhon and an ECHO.     Marguerette Sheller NP-C  9:25 AM

## 2024-02-07 ENCOUNTER — Ambulatory Visit (HOSPITAL_COMMUNITY)

## 2024-02-11 ENCOUNTER — Ambulatory Visit (HOSPITAL_COMMUNITY): Payer: Self-pay | Admitting: Adult Health

## 2024-02-12 ENCOUNTER — Ambulatory Visit (HOSPITAL_COMMUNITY)

## 2024-02-14 ENCOUNTER — Ambulatory Visit (HOSPITAL_COMMUNITY)

## 2024-02-17 ENCOUNTER — Ambulatory Visit (HOSPITAL_COMMUNITY)

## 2024-02-18 ENCOUNTER — Other Ambulatory Visit: Payer: Self-pay

## 2024-02-19 ENCOUNTER — Ambulatory Visit (HOSPITAL_COMMUNITY)

## 2024-02-21 ENCOUNTER — Ambulatory Visit (HOSPITAL_COMMUNITY)

## 2024-02-24 ENCOUNTER — Ambulatory Visit: Admitting: Student

## 2024-02-26 ENCOUNTER — Ambulatory Visit: Attending: Nurse Practitioner | Admitting: Nurse Practitioner

## 2024-02-26 ENCOUNTER — Other Ambulatory Visit: Payer: Self-pay

## 2024-02-26 ENCOUNTER — Other Ambulatory Visit (HOSPITAL_COMMUNITY)
Admission: RE | Admit: 2024-02-26 | Discharge: 2024-02-26 | Disposition: A | Discharge status: Home / Self Care | Source: Ambulatory Visit | Attending: Nurse Practitioner | Admitting: Nurse Practitioner

## 2024-02-26 ENCOUNTER — Encounter: Payer: Self-pay | Admitting: Nurse Practitioner

## 2024-02-26 VITALS — BP 114/76 | HR 100 | Resp 19 | Ht 68.0 in | Wt 222.2 lb

## 2024-02-26 DIAGNOSIS — N3001 Acute cystitis with hematuria: Secondary | ICD-10-CM | POA: Insufficient documentation

## 2024-02-26 DIAGNOSIS — E119 Type 2 diabetes mellitus without complications: Secondary | ICD-10-CM | POA: Diagnosis not present

## 2024-02-26 DIAGNOSIS — E559 Vitamin D deficiency, unspecified: Secondary | ICD-10-CM | POA: Diagnosis not present

## 2024-02-26 DIAGNOSIS — D649 Anemia, unspecified: Secondary | ICD-10-CM

## 2024-02-26 DIAGNOSIS — E7800 Pure hypercholesterolemia, unspecified: Secondary | ICD-10-CM

## 2024-02-26 DIAGNOSIS — Z7984 Long term (current) use of oral hypoglycemic drugs: Secondary | ICD-10-CM | POA: Diagnosis not present

## 2024-02-26 LAB — POCT GLYCOSYLATED HEMOGLOBIN (HGB A1C): HbA1c, POC (controlled diabetic range): 5.4 % (ref 0.0–7.0)

## 2024-02-26 MED ORDER — METFORMIN HCL 500 MG PO TABS
500.0000 mg | ORAL_TABLET | Freq: Two times a day (BID) | ORAL | 4 refills | Status: DC
  Filled 2024-02-26: qty 60, 30d supply, fill #0

## 2024-02-26 MED ORDER — VITAMIN D (ERGOCALCIFEROL) 1.25 MG (50000 UNIT) PO CAPS
50000.0000 [IU] | ORAL_CAPSULE | ORAL | 0 refills | Status: DC
  Filled 2024-02-26: qty 12, 84d supply, fill #0

## 2024-02-26 NOTE — Progress Notes (Signed)
 Assessment & Plan:   Kamden was seen today for diabetes.  Diagnoses and all orders for this visit:  Diabetes mellitus treated with oral medication (HCC) -     POCT glycosylated hemoglobin (Hb A1C) -     Ambulatory referral to Ophthalmology -     metFORMIN  (GLUCOPHAGE ) 500 MG tablet; Take 1 tablet (500 mg total) by mouth 2 (two) times daily with a meal. Continue blood sugar control as discussed in office today, low carbohydrate diet, and regular physical exercise as tolerated, 150 minutes per week (30 min each day, 5 days per week, or 50 min 3 days per week). Keep blood sugar logs with fasting goal of 90-130 mg/dl, post prandial (after you eat) less than 180.  For Hypoglycemia: BS <60 and Hyperglycemia BS >400; contact the clinic ASAP. Annual eye exams and foot exams are recommended.   Acute cystitis with hematuria Continues to decline urology referral -     PSA -     Urine cytology ancillary only -     Urine Culture -     Urinalysis, Complete  Vitamin D  deficiency disease -     VITAMIN D  25 Hydroxy (Vit-D Deficiency, Fractures) -     Vitamin D , Ergocalciferol , (DRISDOL ) 1.25 MG (50000 UNIT) CAPS capsule; Take 1 capsule (50,000 Units total) by mouth once a week.  Anemia, unspecified type -     Iron , TIBC and Ferritin Panel -     CBC with Differential  Pure hypercholesterolemia -     Lipid panel    Patient has been counseled on age-appropriate routine health concerns for screening and prevention. These are reviewed and up-to-date. Referrals have been placed accordingly. Immunizations are up-to-date or declined.    Subjective:   Chief Complaint  Patient presents with   Diabetes    Jonathan Gonzalez 63 y.o. male presents to office today for prediabetes.    He has a past medical history of A-fib, (successful DCCV) asthma, chronic systolic heart failure, PAF, NSVT, DM2, GERD, Hyperlipidemia, and Hypertension.   He missed his appointment for iron  infusion. Stating he is on a  fixed income and cannot afford the payments for [place] lift to get to multiple appointments. Lives with his son (high functioning Autism) in a Motel. He gets help with transportation. Trying to get disability.  He has symptomatic anemia including shortness of breath with minimal activity.   He was referred to urology for elevated PSA.  When he was contacted by urology he declined the schedule.  I did instruct him today that his anemia could be caused by prostate cancer and he declined urology referral again today.     DM 2 A1c at goal with metformin  500 mg twice daily and Jardiance  10 mg daily. Lab Results  Component Value Date   HGBA1C 5.4 02/26/2024    He is followed by cardiology and has an upcoming appointment in 2 days.  Currently taking all medications as prescribed.  Amiodarone  was recently decreased to 200 mg daily. BP Readings from Last 3 Encounters:  02/26/24 114/76  02/06/24 98/64  01/09/24 114/74     Review of Systems  Constitutional:  Negative for fever, malaise/fatigue and weight loss.  HENT: Negative.  Negative for nosebleeds.   Eyes: Negative.  Negative for blurred vision, double vision and photophobia.  Respiratory:  Positive for shortness of breath. Negative for cough.   Cardiovascular: Negative.  Negative for chest pain, palpitations and leg swelling.  Gastrointestinal: Negative.  Negative for heartburn, nausea  and vomiting.  Musculoskeletal: Negative.  Negative for myalgias.  Neurological: Negative.  Negative for dizziness, focal weakness, seizures and headaches.  Psychiatric/Behavioral: Negative.  Negative for suicidal ideas.     Past Medical History:  Diagnosis Date   A-fib (HCC)    Asthma    Diabetes mellitus without complication (HCC)    GERD (gastroesophageal reflux disease)    Hyperlipidemia    Hypertension     Past Surgical History:  Procedure Laterality Date   BUBBLE STUDY  12/06/2022   Procedure: BUBBLE STUDY;  Surgeon: Euell Herrlich, MD;   Location: [facility] ENDOSCOPY;  Service: Cardiovascular;;   CARDIOVERSION N/A 12/06/2022   Procedure: CARDIOVERSION;  Surgeon: Euell Herrlich, MD;  Location: [facility] ENDOSCOPY;  Service: Cardiovascular;  Laterality: N/A;   CARDIOVERSION N/A 01/15/2023   Procedure: CARDIOVERSION;  Surgeon: Mardell Shade, MD;  Location: [facility] INVASIVE CV LAB;  Service: Cardiovascular;  Laterality: N/A;   CARDIOVERSION N/A 02/25/2023   Procedure: CARDIOVERSION;  Surgeon: Mardell Shade, MD;  Location: [facility] INVASIVE CV LAB;  Service: Cardiovascular;  Laterality: N/A;   HERNIA REPAIR     TEE WITHOUT CARDIOVERSION N/A 12/06/2022   Procedure: TRANSESOPHAGEAL ECHOCARDIOGRAM (TEE);  Surgeon: Euell Herrlich, MD;  Location: [facility] ENDOSCOPY;  Service: Cardiovascular;  Laterality: N/A;   TEE WITHOUT CARDIOVERSION N/A 01/15/2023   Procedure: TRANSESOPHAGEAL ECHOCARDIOGRAM;  Surgeon: Mardell Shade, MD;  Location: [facility] INVASIVE CV LAB;  Service: Cardiovascular;  Laterality: N/A;    No family history on file.  Social History Reviewed with no changes to be made today.   Outpatient Medications Prior to Visit  Medication Sig Dispense Refill   albuterol  (VENTOLIN  HFA) 108 (90 Base) MCG/ACT inhaler Inhale 1-2 puffs into the lungs every 6 (six) hours as needed for wheezing or shortness of breath. 18 g 2   amiodarone  (PACERONE ) 200 MG tablet Take 1 tablet (200 mg total) by mouth daily. 30 tablet 3   apixaban  (ELIQUIS ) 5 MG TABS tablet Take 1 tablet (5 mg total) by mouth 2 (two) times daily. 60 tablet 5   Aspirin-Salicylamide-Caffeine (BC HEADACHE POWDER PO) Take 1 packet by mouth daily as needed (headache, pain).     empagliflozin  (JARDIANCE ) 10 MG TABS tablet Take 1 tablet (10 mg total) by mouth daily. 90 tablet 3   furosemide  (LASIX ) 40 MG tablet Take 1 tablet (40 mg total) by mouth every other day.     metoprolol  succinate (TOPROL  XL) 50 MG 24 hr tablet Take 1 tablet (50 mg total) by mouth daily. Take with or immediately  following a meal. 90 tablet 1   omeprazole  (PRILOSEC) 20 MG capsule Take 1 capsule (20 mg total) by mouth daily. FOR ACID REFLUX (Patient taking differently: Take 20 mg by mouth daily. FOR ACID REFLUX AS NEEDED) 90 capsule 1   sacubitril -valsartan  (ENTRESTO ) 24-26 MG Take 1 tablet by mouth 2 (two) times daily. 60 tablet 11   spironolactone  (ALDACTONE ) 25 MG tablet Take 0.5 tablets (12.5 mg total) by mouth daily. 45 tablet 3   metFORMIN  (GLUCOPHAGE ) 500 MG tablet Take 1 tablet (500 mg total) by mouth 2 (two) times daily with a meal. (Patient taking differently: Take 500 mg by mouth 2 (two) times daily with a meal. Daily) 60 tablet 4   Vitamin D , Ergocalciferol , (DRISDOL ) 1.25 MG (50000 UNIT) CAPS capsule Take 1 capsule (50,000 Units total) by mouth once a week. (Patient taking differently: Take 50,000 Units by mouth once a week. Takes on Tuesdays) 12 capsule 0  No facility-administered medications prior to visit.    No Known Allergies     Objective:    BP 114/76 (BP Location: Right Arm, Patient Position: Sitting, Cuff Size: Normal)   Pulse 100   Resp 19   Ht 5' 8 (1.727 m)   Wt 222 lb 3.2 oz (100.8 kg)   SpO2 100%   BMI 33.79 kg/m  Wt Readings from Last 3 Encounters:  02/26/24 222 lb 3.2 oz (100.8 kg)  02/06/24 225 lb (102.1 kg)  01/09/24 226 lb (102.5 kg)    Physical Exam Vitals and nursing note reviewed.  Constitutional:      Appearance: He is well-developed.  HENT:     Head: Normocephalic and atraumatic.  Cardiovascular:     Rate and Rhythm: Regular rhythm. Tachycardia present.     Heart sounds: Normal heart sounds. No murmur heard.    No friction rub. No gallop.  Pulmonary:     Effort: Pulmonary effort is normal. No tachypnea or respiratory distress.     Breath sounds: Normal breath sounds. No decreased breath sounds, wheezing, rhonchi or rales.  Chest:     Chest wall: No tenderness.  Abdominal:     General: Bowel sounds are normal.     Palpations: Abdomen is soft.   Musculoskeletal:        General: Normal range of motion.     Cervical back: Normal range of motion.  Skin:    General: Skin is warm and dry.  Neurological:     Mental Status: He is alert and oriented to person, place, and time.     Coordination: Coordination normal.  Psychiatric:        Behavior: Behavior normal. Behavior is cooperative.        Thought Content: Thought content normal.        Judgment: Judgment normal.          Patient has been counseled extensively about nutrition and exercise as well as the importance of adherence with medications and regular follow-up. The patient was given clear instructions to go to ER or return to medical center if symptoms don't improve, worsen or new problems develop. The patient verbalized understanding.   Follow-up: No follow-ups on file.   Collins Dean, FNP-BC [facility] and [facility] [place], [place] [phone]   02/26/2024, 10:09 AM

## 2024-02-26 NOTE — Progress Notes (Signed)
  Electrophysiology Office Note:   Date:  02/28/2024  ID:  Jonathan Gonzalez, DOB [id number], MRN [id number]  Primary Cardiologist: Dorothye Gathers, MD Electrophysiologist: Boyce Byes, MD      History of Present Illness:   Jonathan Gonzalez is a 63 y.o. male with h/o persistent AF, hypercoagulable state, obesity, snoring, HTN, HFrEF, and sinus tachycardia seen today for routine electrophysiology followup.   Since last being seen in our clinic the patient reports doing OK. Has orthopnea and fatigue. SOB with more than his ADLs. Otherwise, he denies chest pain, palpitations, PND, orthopnea, nausea, vomiting, dizziness, syncope, weight gain, or early satiety.  Reports is taking his medications has directed and has not missed any since his visit in March. Sees HF clinic next month with echo.  Recent monitor showed brief NSVT and occasional SVT, No AF.   Review of systems complete and found to be negative unless listed in HPI.   EP Information / Studies Reviewed:    EKG is ordered today. Personal review as below.  EKG Interpretation Date/Time:  Friday February 28 2024 08:26:37 EDT Ventricular Rate:  100 PR Interval:  150 QRS Duration:  66 QT Interval:  364 QTC Calculation: 469 R Axis:   42  Text Interpretation: Normal sinus rhythm Septal infarct , age undetermined When compared with ECG of 06-Feb-2024 09:25, Septal infarct is now Present Confirmed by Pilar Bridge [id number]) on 02/28/2024 8:31:42 AM    Arrhythmia/Device History No specialty comments available.   Physical Exam:   VS:  BP 112/64   Pulse 100   Wt 224 lb 8 oz (101.8 kg)   SpO2 97%   BMI 34.14 kg/m    Wt Readings from Last 3 Encounters:  02/28/24 224 lb 8 oz (101.8 kg)  02/26/24 222 lb 3.2 oz (100.8 kg)  02/06/24 225 lb (102.1 kg)     GEN: No acute distress NECK: No JVD; No carotid bruits CARDIAC: Regular rate and rhythm, no murmurs, rubs, gallops RESPIRATORY:  Clear to auscultation without rales, wheezing or rhonchi   ABDOMEN: Soft, non-tender, non-distended EXTREMITIES:  No edema; No deformity   ASSESSMENT AND PLAN:    Paroxysmal AF EKG today shows sinus tachycardia Continue eliquis  5 mg BID for CHA2DS2/VASc of at least 3 Continue metoprolol  50 mg daily Continue amiodarone  200 mg daily Recent monitor did not show AF, but pt is potentially interested in ablation since he has had possible photosensitivity on amio.   Will bring back in 2-3 months to discuss ablation with Dr. Marven Slimmer now that he reports compliance with his medications. Has a visit with HF clinic next month with Echo for further optimization.   Chronic systolic CHF NYHA III symptoms Volume status OK. He does have orthopnea and intermittent edema. Was felt to be dry several weeks ago and lasix  decreased to every other day. Hasn't felt like he has needed it on an off day.  Follow with HF clinic States he has not missed his medications since going back on them in March.   HTN Stable on current regimen   Obesity Body mass index is 34.14 kg/m.  Encouraged lifestyle modification     Follow up with Dr. Marven Slimmer in 3 months  Signed, Tylene Galla, PA-C

## 2024-02-27 LAB — CBC WITH DIFFERENTIAL/PLATELET
Basophils Absolute: 0.1 10*3/uL (ref 0.0–0.2)
Basos: 1 %
EOS (ABSOLUTE): 0.1 10*3/uL (ref 0.0–0.4)
Eos: 1 %
Hematocrit: 30.4 % — ABNORMAL LOW (ref 37.5–51.0)
Hemoglobin: 7.7 g/dL — ABNORMAL LOW (ref 13.0–17.7)
Immature Grans (Abs): 0 10*3/uL (ref 0.0–0.1)
Immature Granulocytes: 0 %
Lymphocytes Absolute: 1.5 10*3/uL (ref 0.7–3.1)
Lymphs: 13 %
MCH: 16.6 pg — ABNORMAL LOW (ref 26.6–33.0)
MCHC: 25.3 g/dL — ABNORMAL LOW (ref 31.5–35.7)
MCV: 66 fL — ABNORMAL LOW (ref 79–97)
Monocytes Absolute: 0.7 10*3/uL (ref 0.1–0.9)
Monocytes: 6 %
Neutrophils Absolute: 9 10*3/uL — ABNORMAL HIGH (ref 1.4–7.0)
Neutrophils: 79 %
Platelets: 494 10*3/uL — ABNORMAL HIGH (ref 150–450)
RBC: 4.63 x10E6/uL (ref 4.14–5.80)
RDW: 18 % — ABNORMAL HIGH (ref 11.6–15.4)
WBC: 11.5 10*3/uL — ABNORMAL HIGH (ref 3.4–10.8)

## 2024-02-27 LAB — URINE CYTOLOGY ANCILLARY ONLY
Chlamydia: NEGATIVE
Comment: NEGATIVE
Comment: NEGATIVE
Comment: NORMAL
Neisseria Gonorrhea: NEGATIVE
Trichomonas: NEGATIVE

## 2024-02-27 LAB — IRON,TIBC AND FERRITIN PANEL
Ferritin: 6 ng/mL — ABNORMAL LOW (ref 30–400)
Iron Saturation: 3 % — CL (ref 15–55)
Iron: 12 ug/dL — ABNORMAL LOW (ref 38–169)
Total Iron Binding Capacity: 455 ug/dL — ABNORMAL HIGH (ref 250–450)
UIBC: 443 ug/dL — ABNORMAL HIGH (ref 111–343)

## 2024-02-27 LAB — URINALYSIS, COMPLETE
Bilirubin, UA: NEGATIVE
Ketones, UA: NEGATIVE
Nitrite, UA: POSITIVE — AB
RBC, UA: NEGATIVE
Specific Gravity, UA: 1.025 (ref 1.005–1.030)
Urobilinogen, Ur: 1 mg/dL (ref 0.2–1.0)
pH, UA: 5.5 (ref 5.0–7.5)

## 2024-02-27 LAB — MICROSCOPIC EXAMINATION
Casts: NONE SEEN /LPF
RBC, Urine: NONE SEEN /HPF (ref 0–2)
WBC, UA: >30 /HPF — AB (ref 0–5)

## 2024-02-27 LAB — LIPID PANEL
Chol/HDL Ratio: 2.3 ratio (ref 0.0–5.0)
Cholesterol, Total: 133 mg/dL (ref 100–199)
HDL: 58 mg/dL (ref >39)
LDL Chol Calc (NIH): 61 mg/dL (ref 0–99)
Triglycerides: 66 mg/dL (ref 0–149)
VLDL Cholesterol Cal: 14 mg/dL (ref 5–40)

## 2024-02-27 LAB — VITAMIN D 25 HYDROXY (VIT D DEFICIENCY, FRACTURES): Vit D, 25-Hydroxy: 50.3 ng/mL (ref 30.0–100.0)

## 2024-02-27 LAB — PSA: Prostate Specific Ag, Serum: 0.9 ng/mL (ref 0.0–4.0)

## 2024-02-28 ENCOUNTER — Other Ambulatory Visit: Payer: Self-pay

## 2024-02-28 ENCOUNTER — Ambulatory Visit: Attending: Student | Admitting: Student

## 2024-02-28 ENCOUNTER — Encounter: Payer: Self-pay | Admitting: Student

## 2024-02-28 ENCOUNTER — Ambulatory Visit (HOSPITAL_COMMUNITY)

## 2024-02-28 VITALS — BP 112/64 | HR 100 | Wt 224.5 lb

## 2024-02-28 DIAGNOSIS — I480 Paroxysmal atrial fibrillation: Secondary | ICD-10-CM | POA: Insufficient documentation

## 2024-02-28 DIAGNOSIS — I5020 Unspecified systolic (congestive) heart failure: Secondary | ICD-10-CM | POA: Diagnosis present

## 2024-02-28 DIAGNOSIS — I5022 Chronic systolic (congestive) heart failure: Secondary | ICD-10-CM | POA: Insufficient documentation

## 2024-02-28 NOTE — Patient Instructions (Addendum)
 Medication Instructions:  No medication changes today. *If you need a refill on your cardiac medications before your next appointment, please call your pharmacy*  Lab Work: No labwork ordered today. If you have labs (blood work) drawn today and your tests are completely normal, you will receive your results only by: MyChart Message (if you have MyChart) OR A paper copy in the mail If you have any lab test that is abnormal or we need to change your treatment, we will call you to review the results.  Testing/Procedures: No testing ordered today  Follow-Up: At [facility], you and your health needs are our priority.  As part of our continuing mission to provide you with exceptional heart care, our providers are all part of one team.  This team includes your primary Cardiologist (physician) and Advanced Practice Providers or APPs (Physician Assistants and Nurse Practitioners) who all work together to provide you with the care you need, when you need it.  Your next appointment:   2-3 months with Dr. Marven Slimmer to re-discuss the idea of ablation.  We recommend signing up for the patient portal called MyChart.  Sign up information is provided on this After Visit Summary.  MyChart is used to connect with patients for Virtual Visits (Telemedicine).  Patients are able to view lab/test results, encounter notes, upcoming appointments, etc.  Non-urgent messages can be sent to your provider as well.   To learn more about what you can do with MyChart, go to [email or web address].

## 2024-03-02 ENCOUNTER — Other Ambulatory Visit: Payer: Self-pay | Admitting: Nurse Practitioner

## 2024-03-02 ENCOUNTER — Ambulatory Visit: Payer: Self-pay | Admitting: Nurse Practitioner

## 2024-03-02 ENCOUNTER — Ambulatory Visit (HOSPITAL_COMMUNITY)

## 2024-03-02 DIAGNOSIS — R399 Unspecified symptoms and signs involving the genitourinary system: Secondary | ICD-10-CM

## 2024-03-02 MED ORDER — SULFAMETHOXAZOLE-TRIMETHOPRIM 800-160 MG PO TABS
1.0000 | ORAL_TABLET | Freq: Two times a day (BID) | ORAL | 0 refills | Status: AC
  Filled 2024-03-02: qty 14, 7d supply, fill #0

## 2024-03-02 NOTE — Progress Notes (Incomplete)
 ***In Progress***    Advanced Heart Failure Clinic Note   Primary Care: Winda Hastings, NP Primary Cardiologist: Dr. Renna Cary HF Cardiologist: Dr. Julane Ny  HPI:  Mr Casale is a 63 y.o. male with history of HFrEF, PAF(diagnosed 08/2022), HTN, HLD, DM, NSVT. and chronic HFrEF.    Patient admitted 09/14/22 with new afib with RVR. Treated with rate control. 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 Echo demonstrated EF 45-50%, RV okay, severe LAE. Plan was for cardioversion at a later date but cancelled the procedure d/t passing of his wife. He later missed doses of eliquis  and ran out of metoprolol  and farxiga . He was admitted with Afib with RVR and a/c CHF in March 2024. EF down slightly further to 35-40%. He was diuresed and GDMT titrated. He underwent successful DCCV during the admission.    TOC Clinic 01/04/23 and was back in AF. Started on amiodarone  and underwent outpatient TEE/DCVV on 01/15/23.   Admitted 02/2023 from AHF clinic with AF with RVR and a/c dHF. Started on amiodarone  gtt, diuresed and underwent DCCV to NSR.   He returned for HF follow up with NP on 02/06/24. Reported feeling fine. *** Reported photo sensitivity when he walks outside that goes away when he goes back inside. The day prior to clinic, he reported having a couple dizzy episodes but he reported having dizzy episodes his entire life. He says each dizzy episode is brief. Reported SOB with exertion. Patient was able walk around the store but he tried to slow down.  Denied PND/Orthopnea. Appetite was ok. No fever or chills reported. Weight at home was 220 pounds. Reported taking all medications. Lives with his son (high functioning Autism) in a motel. He received help with transportation. Reported trying to get disability.   Today he returns to HF clinic for pharmacist medication titration. At last visit with NP, the amiodarone  was decreased to 200 mg daily due to photosensitivity. Furosemide  was  decreased to every other day due volume status looking low.  ***He had appointment with EP on 02/28/24. Amiodarone  200 mg daily was continued. Patient reported orthopnea and intermittent edema, but patient reported not needing the furosemide  on his 'off' days.***  Shortness of breath/dyspnea on exertion? {YES NO:22349}  Orthopnea/PND? {YES NO:22349} Edema? {YES NO:22349} Lightheadedness/dizziness? {YES NO:22349} Daily weights at home? {YES NO:22349} Blood pressure/heart rate monitoring at home? {YES [id number] Following low-sodium/fluid-restricted diet? {YES NO:22349}  HF Medications: Metoprolol  succinate 50 mg daily *** Entresto  24/26 mg BID *** Jardiance  10 mg daily *** Spironolactone  12.5 mg daily  Furosemide  40 mg every other day   Has the patient been experiencing any side effects to the medications prescribed?  {YES NO:22349}  Does the patient have any problems obtaining medications due to transportation or finances?   {YES NO:22349}  Understanding of regimen: {excellent/good/fair/poor:19665} Understanding of indications: {excellent/good/fair/poor:19665} Potential of compliance: {excellent/good/fair/poor:19665} Patient understands to avoid NSAIDs. Patient understands to avoid decongestants.   Pertinent Lab Values: (02/06/24) Serum creatinine 1.14 mg/dL, BUN 11 mg/dL, Potassium 4.5 mmol/L, Sodium 138 mmol/L, BNP 13.9 pg/mL (01/09/24)  Vital Signs: Weight: *** (last clinic weight: 225 lbs) Blood pressure: *** (last clinic BP: 98/64 mmHg) *** Heart rate: *** (last clinic HR: 84 bpm) ***  Assessment/Plan: 1. PAF  - Successful DCCV 11/2022, 12/2022 & 02/2023 - EKG today- NSR. *** - Cut back amiodarone  to 200 mg daily due to concern amio may be causing photosensitivity. *** - Continue Eliquis  5 mg bid - Long term would aim for eventual ablation, refer  to EP if having AF recurrence on Zio *** EP appointment on *** - Order placed for sleep study, patient agreeable ***   2. Chronic  systolic HF - Suspect possibly tachy-mediated. Discovered in setting of Afib with RVR in 08/2022.  - Echo 08/2022: EF 45-50%, RV okay, severe LAE. No prior echo for comparison. - Echo 11/2022 in setting of Afib with RVR: EF 35-40% - TEE 12/2022 EF 30% - NYHA III. Volume status looks a little low. Cut back furosemide  to 40 mg every other day. - Continue metoprolol  succinate 50 mg daily - Continue Entresto  24/26 mg bid - Continue Jardiance  10 mg daily - Continue spironolactone  12.5 mg daily - Check BMET  - Plan to repeat Echo on HF meds in 2 months.    3. HTN - BP is a low. No med titration. *** - Sleep study ordered   4. HLD - Continue atorvastatin .    5. CKD: Stage 3 - Baseline Scr ~1.5.  - Continue SGLT2i -Check BMET    6. SDOH - Insured - Has transportation needs - Lives in hotel with autistic son  Follow up ***  Volney Grumbles, PharmD PGY-1 Acute Care Pharmacy Resident  Luster Salters, PharmD, BCPS, [facility], CPP Heart Failure Clinic Pharmacist [phone]

## 2024-03-03 ENCOUNTER — Other Ambulatory Visit: Payer: Self-pay

## 2024-03-03 ENCOUNTER — Other Ambulatory Visit (HOSPITAL_COMMUNITY): Payer: Self-pay

## 2024-03-03 ENCOUNTER — Inpatient Hospital Stay (HOSPITAL_COMMUNITY)
Admission: RE | Admit: 2024-03-03 | Discharge: 2024-03-03 | Disposition: A | Discharge status: Home / Self Care | Source: Ambulatory Visit

## 2024-03-04 ENCOUNTER — Ambulatory Visit (HOSPITAL_COMMUNITY)

## 2024-03-05 ENCOUNTER — Other Ambulatory Visit: Payer: Self-pay

## 2024-03-05 ENCOUNTER — Ambulatory Visit: Admitting: Gastroenterology

## 2024-03-06 ENCOUNTER — Other Ambulatory Visit: Payer: Self-pay

## 2024-03-06 ENCOUNTER — Ambulatory Visit (HOSPITAL_COMMUNITY)

## 2024-03-09 ENCOUNTER — Ambulatory Visit (HOSPITAL_COMMUNITY)

## 2024-03-11 ENCOUNTER — Ambulatory Visit (HOSPITAL_COMMUNITY)

## 2024-03-11 ENCOUNTER — Ambulatory Visit: Payer: Self-pay

## 2024-03-11 NOTE — Telephone Encounter (Signed)
Patient identified by name and date of birth.  Patient aware of response from provider.

## 2024-03-11 NOTE — Telephone Encounter (Signed)
  FYI Only or Action Required?: FYI only for provider.  Patient was last seen in primary care on 02/26/2024 by Theotis Haze ORN, NP. Called Nurse Triage reporting No chief complaint on file.. Symptoms began several days ago. Interventions attempted: OTC medications: Aleve. Symptoms are: unchanged.  Triage Disposition: No disposition on file.  Patient/caregiver understands and will follow disposition?:   Copied from CRM [id number]. Topic: Clinical - Red Word Triage >> Mar 11, 2024  8:39 AM Edsel HERO wrote: Numbness/tingling in left leg Reason for Disposition  [1] Tingling (e.g., pins and needles) of the face, arm / hand, or leg / foot on one side of the body AND [2] present now (Exceptions: Chronic or recurrent symptom lasting > 4 weeks; or tingling from known cause, such as: bumped elbow, carpal tunnel syndrome, pinched nerve, frostbite.)  Answer Assessment - Initial Assessment Questions 1. SYMPTOM: What is the main symptom you are concerned about? (e.g., weakness, numbness)     Numbness, Tingling  2. ONSET: When did this start? (minutes, hours, days; while sleeping)     Friday  3. LAST NORMAL: When was the last time you (the patient) were normal (no symptoms)?     Before Friday  4. PATTERN Does this come and go, or has it been constant since it started?  Is it present now?     Comes and Goes, mostly with Aleve  5. CARDIAC SYMPTOMS: Have you had any of the following symptoms: chest pain, difficulty breathing, palpitations?     Chest Pain   6. NEUROLOGIC SYMPTOMS: Have you had any of the following symptoms: headache, dizziness, vision loss, double vision, changes in speech, unsteady on your feet?     Denies  7. OTHER SYMPTOMS: Do you have any other symptoms?     No  Protocols used: Neurologic Deficit-A-AH

## 2024-03-11 NOTE — Telephone Encounter (Signed)
Urgent care or ED

## 2024-03-12 ENCOUNTER — Observation Stay (HOSPITAL_COMMUNITY)
Admission: EM | Admit: 2024-03-12 | Discharge: 2024-03-12 | Discharge status: Against Medical Advice | Attending: Internal Medicine | Admitting: Internal Medicine

## 2024-03-12 ENCOUNTER — Other Ambulatory Visit: Payer: Self-pay

## 2024-03-12 ENCOUNTER — Encounter (HOSPITAL_COMMUNITY): Payer: Self-pay

## 2024-03-12 ENCOUNTER — Emergency Department (HOSPITAL_COMMUNITY)

## 2024-03-12 ENCOUNTER — Inpatient Hospital Stay (HOSPITAL_COMMUNITY)

## 2024-03-12 DIAGNOSIS — R0602 Shortness of breath: Secondary | ICD-10-CM | POA: Insufficient documentation

## 2024-03-12 DIAGNOSIS — J45909 Unspecified asthma, uncomplicated: Secondary | ICD-10-CM | POA: Diagnosis present

## 2024-03-12 DIAGNOSIS — R42 Dizziness and giddiness: Secondary | ICD-10-CM | POA: Diagnosis not present

## 2024-03-12 DIAGNOSIS — I70229 Atherosclerosis of native arteries of extremities with rest pain, unspecified extremity: Secondary | ICD-10-CM

## 2024-03-12 DIAGNOSIS — K219 Gastro-esophageal reflux disease without esophagitis: Secondary | ICD-10-CM | POA: Diagnosis present

## 2024-03-12 DIAGNOSIS — M79605 Pain in left leg: Secondary | ICD-10-CM | POA: Diagnosis present

## 2024-03-12 DIAGNOSIS — Z79899 Other long term (current) drug therapy: Secondary | ICD-10-CM

## 2024-03-12 DIAGNOSIS — D649 Anemia, unspecified: Principal | ICD-10-CM | POA: Insufficient documentation

## 2024-03-12 DIAGNOSIS — Z6833 Body mass index (BMI) 33.0-33.9, adult: Secondary | ICD-10-CM

## 2024-03-12 DIAGNOSIS — G2581 Restless legs syndrome: Secondary | ICD-10-CM | POA: Diagnosis present

## 2024-03-12 DIAGNOSIS — I739 Peripheral vascular disease, unspecified: Principal | ICD-10-CM

## 2024-03-12 DIAGNOSIS — E785 Hyperlipidemia, unspecified: Secondary | ICD-10-CM | POA: Diagnosis present

## 2024-03-12 DIAGNOSIS — R451 Restlessness and agitation: Secondary | ICD-10-CM | POA: Diagnosis present

## 2024-03-12 DIAGNOSIS — Z7984 Long term (current) use of oral hypoglycemic drugs: Secondary | ICD-10-CM

## 2024-03-12 DIAGNOSIS — E1151 Type 2 diabetes mellitus with diabetic peripheral angiopathy without gangrene: Secondary | ICD-10-CM | POA: Diagnosis present

## 2024-03-12 DIAGNOSIS — D509 Iron deficiency anemia, unspecified: Secondary | ICD-10-CM | POA: Diagnosis not present

## 2024-03-12 DIAGNOSIS — I5022 Chronic systolic (congestive) heart failure: Secondary | ICD-10-CM | POA: Diagnosis present

## 2024-03-12 DIAGNOSIS — I110 Hypertensive heart disease with heart failure: Secondary | ICD-10-CM | POA: Diagnosis present

## 2024-03-12 DIAGNOSIS — Z7901 Long term (current) use of anticoagulants: Secondary | ICD-10-CM

## 2024-03-12 DIAGNOSIS — I480 Paroxysmal atrial fibrillation: Secondary | ICD-10-CM | POA: Diagnosis present

## 2024-03-12 DIAGNOSIS — Z5329 Procedure and treatment not carried out because of patient's decision for other reasons: Secondary | ICD-10-CM | POA: Diagnosis present

## 2024-03-12 DIAGNOSIS — E669 Obesity, unspecified: Secondary | ICD-10-CM | POA: Diagnosis present

## 2024-03-12 LAB — IRON AND TIBC
Iron: 13 ug/dL — ABNORMAL LOW (ref 45–182)
Saturation Ratios: 3 % — ABNORMAL LOW (ref 17.9–39.5)
TIBC: 459 ug/dL — ABNORMAL HIGH (ref 250–450)
UIBC: 446 ug/dL

## 2024-03-12 LAB — FOLATE: Folate: 9.2 ng/mL (ref >5.9)

## 2024-03-12 LAB — BRAIN NATRIURETIC PEPTIDE: B Natriuretic Peptide: 25 pg/mL (ref 0.0–100.0)

## 2024-03-12 LAB — CBC
HCT: 26.6 % — ABNORMAL LOW (ref 39.0–52.0)
Hemoglobin: 7 g/dL — ABNORMAL LOW (ref 13.0–17.0)
MCH: 16.3 pg — ABNORMAL LOW (ref 26.0–34.0)
MCHC: 26.3 g/dL — ABNORMAL LOW (ref 30.0–36.0)
MCV: 62 fL — ABNORMAL LOW (ref 80.0–100.0)
Platelets: 556 10*3/uL — ABNORMAL HIGH (ref 150–400)
RBC: 4.29 MIL/uL (ref 4.22–5.81)
RDW: 21.3 % — ABNORMAL HIGH (ref 11.5–15.5)
WBC: 10.9 10*3/uL — ABNORMAL HIGH (ref 4.0–10.5)
nRBC: 0.4 % — ABNORMAL HIGH (ref 0.0–0.2)

## 2024-03-12 LAB — HEPATIC FUNCTION PANEL
ALT: 21 U/L (ref 0–44)
AST: 27 U/L (ref 15–41)
Albumin: 3.5 g/dL (ref 3.5–5.0)
Alkaline Phosphatase: 49 U/L (ref 38–126)
Bilirubin, Direct: <0.1 mg/dL (ref 0.0–0.2)
Total Bilirubin: 0.2 mg/dL (ref 0.0–1.2)
Total Protein: 6.8 g/dL (ref 6.5–8.1)

## 2024-03-12 LAB — BASIC METABOLIC PANEL WITH GFR
Anion gap: 10 (ref 5–15)
BUN: 11 mg/dL (ref 8–23)
CO2: 22 mmol/L (ref 22–32)
Calcium: 9 mg/dL (ref 8.9–10.3)
Chloride: 107 mmol/L (ref 98–111)
Creatinine, Ser: 1.1 mg/dL (ref 0.61–1.24)
GFR, Estimated: >60 mL/min (ref >60)
Glucose, Bld: 119 mg/dL — ABNORMAL HIGH (ref 70–99)
Potassium: 3.8 mmol/L (ref 3.5–5.1)
Sodium: 139 mmol/L (ref 135–145)

## 2024-03-12 LAB — RETICULOCYTES
Immature Retic Fract: 37.7 % — ABNORMAL HIGH (ref 2.3–15.9)
RBC.: 3.92 MIL/uL — ABNORMAL LOW (ref 4.22–5.81)
Retic Count, Absolute: 64.7 10*3/uL (ref 19.0–186.0)
Retic Ct Pct: 1.7 % (ref 0.4–3.1)

## 2024-03-12 LAB — VAS US ABI WITH/WO TBI
Left ABI: 1.17
Right ABI: 1.4

## 2024-03-12 LAB — VITAMIN B12: Vitamin B-12: 350 pg/mL (ref 180–914)

## 2024-03-12 LAB — CBG MONITORING, ED: Glucose-Capillary: 96 mg/dL (ref 70–99)

## 2024-03-12 LAB — FERRITIN: Ferritin: <3 ng/mL — ABNORMAL LOW (ref 24–336)

## 2024-03-12 MED ORDER — AMIODARONE HCL 200 MG PO TABS: 200.0000 mg | ORAL_TABLET | Freq: Every day | ORAL | Status: DC

## 2024-03-12 MED ORDER — METOPROLOL SUCCINATE ER 25 MG PO TB24: 50.0000 mg | ORAL_TABLET | Freq: Every day | ORAL | Status: DC

## 2024-03-12 MED ORDER — ALBUTEROL SULFATE (2.5 MG/3ML) 0.083% IN NEBU: 3.0000 mL | INHALATION_SOLUTION | Freq: Four times a day (QID) | RESPIRATORY_TRACT | Status: DC | PRN

## 2024-03-12 MED ORDER — ONDANSETRON HCL 4 MG PO TABS: 4.0000 mg | ORAL_TABLET | Freq: Four times a day (QID) | ORAL | Status: DC | PRN

## 2024-03-12 MED ORDER — ONDANSETRON HCL 4 MG/2ML IJ SOLN
4.0000 mg | Freq: Four times a day (QID) | INTRAMUSCULAR | Status: DC | PRN
Start: 2024-03-12 — End: 2024-03-13

## 2024-03-12 MED ORDER — ACETAMINOPHEN 325 MG PO TABS: 650.0000 mg | ORAL_TABLET | Freq: Four times a day (QID) | ORAL | Status: DC | PRN

## 2024-03-12 MED ORDER — SODIUM CHLORIDE 0.9% FLUSH: 3.0000 mL | Freq: Two times a day (BID) | INTRAVENOUS | Status: DC

## 2024-03-12 MED ORDER — IOHEXOL 350 MG/ML SOLN
100.0000 mL | Freq: Once | INTRAVENOUS | Status: AC | PRN
  Administered 2024-03-12: 100 mL via INTRAVENOUS

## 2024-03-12 MED ORDER — ACETAMINOPHEN 650 MG RE SUPP: 650.0000 mg | Freq: Four times a day (QID) | RECTAL | Status: DC | PRN

## 2024-03-12 MED ORDER — INSULIN ASPART 100 UNIT/ML IJ SOLN: 0.0000 [IU] | INTRAMUSCULAR | Status: DC

## 2024-03-12 MED ORDER — MORPHINE SULFATE (PF) 2 MG/ML IV SOLN: 2.0000 mg | INTRAVENOUS | Status: DC | PRN

## 2024-03-12 MED ORDER — SODIUM CHLORIDE 0.9% FLUSH: 3.0000 mL | INTRAVENOUS | Status: DC | PRN

## 2024-03-12 NOTE — H&P (Addendum)
 History and Physical    Patient: Jonathan Gonzalez DOB: 1961-08-01 DOA: 03/12/2024 DOS: the patient was seen and examined on 03/12/2024 . PCP: Theotis Haze ORN, NP  Patient coming from: Home Chief complaint: Chief Complaint  Patient presents with   Leg Pain   HPI:  Jonathan Gonzalez is a 63 y.o. male with past medical history  of  type 2 diabetes, atrial fibrillation on Eliquis , CHF, hypertension, and hyperlipidemia presenting to the emergency department today with left leg restlessness and discomfort x 1 week ,found to be anemic. Pt is on AC denies any s/s of melena, hematemesis, hematochezia.  Patient has a son and he almost left emergency room in order to take care of him.  Has decided to stay.  CT abdomen is pending.  Stool guaiac is pending.  Chart review shows patient was recently treated for urinary tract infection with Bactrim  on 11 June.  ED Course: Pt in ed at bedside  is alert awake oriented. Vital signs in the ED were notable for the following:  Vitals:   03/12/24 1353 03/12/24 1645 03/12/24 1900 03/12/24 1913  BP: 131/80 124/81 136/75   Pulse: 94 94 95   Temp: 98.1 F (36.7 C)   98 F (36.7 C)  Resp: 18 18 17    Height:      Weight:      SpO2: 100% 100% 100%   TempSrc: Oral   Oral  BMI (Calculated):      >>ED evaluation thus far shows: BMP today is within normal limits with a glucose of 119. LFTs are within normal limits. BNP is 25. Iron  studies today show iron  of 13 TIBC of 459 ferritin less than 3 folate of 9.2 saturation less than 2 B12 of 350. CBC today shows white count of 10.9 hemoglobin of 7 platelets of 556 MCV of 62. Chest x-ray done today is negative for any acute abnormality.   EKG shows sinus tach at 101 PR of 154 QTc of 482, otherwise essentially normal normal axis and normal intervals. Patient had ABIs in the emergency room today with prelim report of Right: Resting right ankle-brachial index indicates noncompressible right  lower  extremity arteries. The right toe-brachial index is abnormal. Left: Resting left ankle-brachial index is within normal range. The left  toe-brachial index is abnormal.   >>While in the ED patient received the following: Medications  albuterol  (PROVENTIL ) (2.5 MG/3ML) 0.083% nebulizer solution 3 mL (has no administration in time range)  amiodarone  (PACERONE ) tablet 200 mg (has no administration in time range)  metoprolol  succinate (TOPROL -XL) 24 hr tablet 50 mg (has no administration in time range)  acetaminophen  (TYLENOL ) tablet 650 mg (has no administration in time range)    Or  acetaminophen  (TYLENOL ) suppository 650 mg (has no administration in time range)  morphine (PF) 2 MG/ML injection 2 mg (has no administration in time range)  ondansetron  (ZOFRAN ) tablet 4 mg (has no administration in time range)    Or  ondansetron  (ZOFRAN ) injection 4 mg (has no administration in time range)  sodium chloride  flush (NS) 0.9 % injection 3-10 mL (has no administration in time range)  sodium chloride  flush (NS) 0.9 % injection 3-10 mL (has no administration in time range)  insulin  aspart (novoLOG ) injection 0-9 Units (has no administration in time range)   Review of Systems  Constitutional:  Positive for malaise/fatigue.  All other systems reviewed and are negative.  Past Medical History:  Diagnosis Date   A-fib [facility])    Asthma  Diabetes mellitus without complication (HCC)    GERD (gastroesophageal reflux disease)    Hyperlipidemia    Hypertension    Past Surgical History:  Procedure Laterality Date   BUBBLE STUDY  12/06/2022   Procedure: BUBBLE STUDY;  Surgeon: Loni Soyla LABOR, MD;  Location: [facility] ENDOSCOPY;  Service: Cardiovascular;;   CARDIOVERSION N/A 12/06/2022   Procedure: CARDIOVERSION;  Surgeon: Loni Soyla LABOR, MD;  Location: [facility] ENDOSCOPY;  Service: Cardiovascular;  Laterality: N/A;   CARDIOVERSION N/A 01/15/2023   Procedure: CARDIOVERSION;  Surgeon: Cherrie Toribio SAUNDERS, MD;   Location: [facility] INVASIVE CV LAB;  Service: Cardiovascular;  Laterality: N/A;   CARDIOVERSION N/A 02/25/2023   Procedure: CARDIOVERSION;  Surgeon: Cherrie Toribio SAUNDERS, MD;  Location: [facility] INVASIVE CV LAB;  Service: Cardiovascular;  Laterality: N/A;   HERNIA REPAIR     TEE WITHOUT CARDIOVERSION N/A 12/06/2022   Procedure: TRANSESOPHAGEAL ECHOCARDIOGRAM (TEE);  Surgeon: Loni Soyla LABOR, MD;  Location: [facility] ENDOSCOPY;  Service: Cardiovascular;  Laterality: N/A;   TEE WITHOUT CARDIOVERSION N/A 01/15/2023   Procedure: TRANSESOPHAGEAL ECHOCARDIOGRAM;  Surgeon: Cherrie Toribio SAUNDERS, MD;  Location: [facility] INVASIVE CV LAB;  Service: Cardiovascular;  Laterality: N/A;    reports that he has never smoked. He has never used smokeless tobacco. He reports that he does not drink alcohol and does not use drugs. No Known Allergies History reviewed. No pertinent family history. Prior to Admission medications   Medication Sig Start Date End Date Taking? Authorizing Provider  albuterol  (VENTOLIN  HFA) 108 (90 Base) MCG/ACT inhaler Inhale 1-2 puffs into the lungs every 6 (six) hours as needed for wheezing or shortness of breath. 08/21/23   Robinson, John K, PA-C  amiodarone  (PACERONE ) 200 MG tablet Take 1 tablet (200 mg total) by mouth daily. 02/06/24   Clegg, Amy D, NP  apixaban  (ELIQUIS ) 5 MG TABS tablet Take 1 tablet (5 mg total) by mouth 2 (two) times daily. 02/26/23   Bensimhon, Daniel R, MD  Aspirin-Salicylamide-Caffeine (BC HEADACHE POWDER PO) Take 1 packet by mouth daily as needed (headache, pain).    [provider]  empagliflozin  (JARDIANCE ) 10 MG TABS tablet Take 1 tablet (10 mg total) by mouth daily. 02/14/23   Cindie Ole DASEN, MD  furosemide  (LASIX ) 40 MG tablet Take 1 tablet (40 mg total) by mouth every other day. 02/06/24   Clegg, Amy D, NP  metFORMIN  (GLUCOPHAGE ) 500 MG tablet Take 1 tablet (500 mg total) by mouth 2 (two) times daily with a meal. 02/26/24   Fleming, Zelda W, NP  metoprolol  succinate (TOPROL   XL) 50 MG 24 hr tablet Take 1 tablet (50 mg total) by mouth daily. Take with or immediately following a meal. 11/22/23   Tillery, Ozell Barter, PA-C  omeprazole  (PRILOSEC) 20 MG capsule Take 1 capsule (20 mg total) by mouth daily. FOR ACID REFLUX 12/28/22   Fleming, Zelda W, NP  sacubitril -valsartan  (ENTRESTO ) 24-26 MG Take 1 tablet by mouth 2 (two) times daily. 12/20/23   Milford, Harlene HERO, FNP  spironolactone  (ALDACTONE ) 25 MG tablet Take 0.5 tablets (12.5 mg total) by mouth daily. 01/09/24   Lee, [place], NP  sulfamethoxazole -trimethoprim  (BACTRIM  DS) 800-160 MG tablet Take 1 tablet by mouth 2 (two) times daily for 7 days. 03/02/24 03/13/24  Fleming, Zelda W, NP  Vitamin D , Ergocalciferol , (DRISDOL ) 1.25 MG (50000 UNIT) CAPS capsule Take 1 capsule (50,000 Units total) by mouth once a week. 02/26/24   Fleming, Zelda W, NP  Vitals:   03/12/24 1353 03/12/24 1645 03/12/24 1900 03/12/24 1913  BP: 131/80 124/81 136/75   Pulse: 94 94 95   Resp: 18 18 17    Temp: 98.1 F (36.7 C)   98 F (36.7 C)  TempSrc: Oral   Oral  SpO2: 100% 100% 100%   Weight:      Height:       Physical Exam Vitals and nursing note reviewed.  Constitutional:      General: He is not in acute distress.    Appearance: He is obese. He is not ill-appearing.  HENT:     Head: Normocephalic and atraumatic.     Right Ear: Hearing and external ear normal.     Left Ear: Hearing and external ear normal.     Nose: No nasal deformity.     Mouth/Throat:     Lips: Pink.   Eyes:     General: Lids are normal.     Extraocular Movements: Extraocular movements intact.    Cardiovascular:     Rate and Rhythm: Normal rate and regular rhythm.     Pulses:          Dorsalis pedis pulses are 1+ on the right side and 1+ on the left side.       Posterior tibial pulses are 1+ on the right side and 1+ on the left side.     Heart sounds: Normal heart sounds.   Pulmonary:     Effort: Pulmonary effort is normal.     Breath sounds: Normal breath sounds.  Abdominal:     General: Bowel sounds are normal. There is no distension.     Palpations: Abdomen is soft. There is no mass.     Tenderness: There is no abdominal tenderness.   Musculoskeletal:     Right lower leg: No edema.     Left lower leg: No edema.   Skin:    General: Skin is warm.   Neurological:     General: No focal deficit present.     Mental Status: He is alert and oriented to person, place, and time.     Cranial Nerves: Cranial nerves 2-12 are intact.   Psychiatric:        Mood and Affect: Mood normal.        Speech: Speech normal.        Behavior: Behavior normal.     Labs on Admission: I have personally reviewed following labs and imaging studies CBC: Recent Labs  Lab 03/12/24 1201  WBC 10.9*  HGB 7.0*  HCT 26.6*  MCV 62.0*  PLT 556*   Basic Metabolic Panel: Recent Labs  Lab 03/12/24 1201  NA 139  K 3.8  CL 107  CO2 22  GLUCOSE 119*  BUN 11  CREATININE 1.10  CALCIUM  9.0   GFR: Estimated Creatinine Clearance: 78 mL/min (by C-G formula based on SCr of 1.1 mg/dL). Liver Function Tests: Recent Labs  Lab 03/12/24 1429  AST 27  ALT 21  ALKPHOS 49  BILITOT 0.2  PROT 6.8  ALBUMIN 3.5   No results for input(s): LIPASE, AMYLASE in the last 168 hours. No results for input(s): AMMONIA in the last 168 hours. Coagulation Profile: No results for input(s): INR, PROTIME in the last 168 hours. Cardiac Enzymes: No results for input(s): CKTOTAL, CKMB, CKMBINDEX, TROPONINI in the last 168 hours. BNP (last 3 results) No results for input(s): PROBNP in the last 8760 hours. HbA1C: No results for input(s): HGBA1C in the last 72  hours. CBG: No results for input(s): GLUCAP in the last 168 hours. Lipid Profile: No results for input(s): CHOL, HDL, LDLCALC, TRIG, CHOLHDL, LDLDIRECT in the last 72 hours. Thyroid  Function  Tests: No results for input(s): TSH, T4TOTAL, FREET4, T3FREE, THYROIDAB in the last 72 hours. Anemia Panel: Recent Labs    03/12/24 1730  VITAMINB12 350  FOLATE 9.2  FERRITIN <3*  TIBC 459*  IRON  13*  RETICCTPCT 1.7   Urine analysis:    Component Value Date/Time   COLORURINE YELLOW 07/17/2022 1745   APPEARANCEUR Clear 02/26/2024 0931   LABSPEC 1.028 07/17/2022 1745   PHURINE 5.0 07/17/2022 1745   GLUCOSEU 3+ (A) 02/26/2024 0931   HGBUR NEGATIVE 07/17/2022 1745   BILIRUBINUR Negative 02/26/2024 0931   KETONESUR trace (5) (A) 01/01/2024 1126   KETONESUR 20 (A) 07/17/2022 1745   PROTEINUR 1+ (A) 02/26/2024 0931   PROTEINUR NEGATIVE 07/17/2022 1745   UROBILINOGEN 0.2 01/01/2024 1126   NITRITE Positive (A) 02/26/2024 0931   NITRITE NEGATIVE 07/17/2022 1745   LEUKOCYTESUR 2+ (A) 02/26/2024 0931   LEUKOCYTESUR NEGATIVE 07/17/2022 1745   Radiological Exams on Admission: VAS US  ABI WITH/WO TBI Result Date: 03/12/2024  LOWER EXTREMITY DOPPLER STUDY Patient Name:  Jonathan Gonzalez  Date of Exam:   03/12/2024 Medical Rec #: [id number]         Accession #:    [id number] Date of Birth: 11-19-1960         Patient Gender: M Patient Age:   10 years Exam [facility]:  [facility] Procedure:      VAS US  ABI WITH/WO TBI Referring Phys: AMY LOWTHER --------------------------------------------------------------------------------  Indications: Left leg is restless at night. Patient denies claudication              symptoms. High Risk Factors: Hypertension, hyperlipidemia, Diabetes, no history of                    smoking. Other Factors: Atrial fibrillation with RVR (on Eliquis ), HFmrEF.  Limitations: Today's exam was limited due to Dorsalis Pedis arteries rest near              surface of foot and compress with light touch. Comparison Study: No prior study on file Performing Technologist: Rachel Pellet RVS  Examination Guidelines: A complete evaluation includes at minimum, Doppler waveform  signals and systolic blood pressure reading at the level of bilateral brachial, anterior tibial, and posterior tibial arteries, when vessel segments are accessible. Bilateral testing is considered an integral part of a complete examination. Photoelectric Plethysmograph (PPG) waveforms and toe systolic pressure readings are included as required and additional duplex testing as needed. Limited examinations for reoccurring indications may be performed as noted.  ABI Findings: +---------+------------------+-----+-----------+--------+ Right    Rt Pressure (mmHg)IndexWaveform   Comment  +---------+------------------+-----+-----------+--------+ Brachial 149                    triphasic           +---------+------------------+-----+-----------+--------+ PTA      196               1.32 multiphasic         +---------+------------------+-----+-----------+--------+ DP       209               1.40 multiphasic         +---------+------------------+-----+-----------+--------+ Great Toe121               0.81 Abnormal            +---------+------------------+-----+-----------+--------+ +---------+------------------+-----+-----------+-------+  Left     Lt Pressure (mmHg)IndexWaveform   Comment +---------+------------------+-----+-----------+-------+ Brachial 149                    triphasic          +---------+------------------+-----+-----------+-------+ PTA      175               1.17 multiphasic        +---------+------------------+-----+-----------+-------+ DP       172               1.15 multiphasic        +---------+------------------+-----+-----------+-------+ Great Toe122               0.82 Abnormal           +---------+------------------+-----+-----------+-------+ +-------+-----------+-----------+------------+------------+ ABI/TBIToday's ABIToday's TBIPrevious ABIPrevious TBI +-------+-----------+-----------+------------+------------+ Right  1.4        0.81                                 +-------+-----------+-----------+------------+------------+ Left   1.17       0.82                                +-------+-----------+-----------+------------+------------+   Summary: Right: Resting right ankle-brachial index indicates noncompressible right lower extremity arteries. The right toe-brachial index is abnormal. Left: Resting left ankle-brachial index is within normal range. The left toe-brachial index is abnormal. *See table(s) above for measurements and observations.     Preliminary    DG Chest 2 View Result Date: 03/12/2024 CLINICAL DATA:  Shortness of breath. EXAM: CHEST - 2 VIEW COMPARISON:  August 21, 2023. FINDINGS: The heart size and mediastinal contours are within normal limits. Elevated left hemidiaphragm is again noted. Stable bibasilar densities are noted most consistent with scarring. No definite acute pulmonary abnormality is seen. The visualized skeletal structures are unremarkable. IMPRESSION: No acute abnormality seen.  Chronic findings as noted above. Electronically Signed   By: Lynwood Landy Raddle M.D.   On: 03/12/2024 13:22   Data Reviewed: Relevant notes from primary care and specialist visits, past discharge summaries as available in EHR, including Care Everywhere . Prior diagnostic testing as pertinent to current admission diagnoses, Updated medications and problem lists for reconciliation .ED course, including vitals, labs, imaging, treatment and response to treatment,Triage notes, nursing and pharmacy notes and ED provider's notes.Notable results as noted in HPI.Discussed case with EDMD/ ED APP/ or Specialty MD on call and as needed.  Assessment & Plan  >> Left leg pain: 2/2 to RLS. Pt does have 1+ pulse and abnormal ABIs and will need to follow-up with vascular.  >> Anemia: Suspect prolonged iron  deficiency or thalassemia from his prolonged low MCV.    Latest Ref Rng & Units 03/12/2024   12:01 PM 02/26/2024    9:31 AM 12/20/2023     9:51 AM  CBC  WBC 4.0 - 10.5 K/uL 10.9  11.5  8.1   Hemoglobin 13.0 - 17.0 g/dL 7.0  7.7  9.9   Hematocrit 39.0 - 52.0 % 26.6  30.4  35.7   Platelets 150 - 400 K/uL 556  494  378   GI consult.  Iron  studies are C/W IDA . Hematology consult. Will transfuse one unit if repeat h/h is below 7.  With his h/o CHF we will have cautious volume management.  At 7:45 PM: Nurse  devon came by and said pt wants to leave AMA. I went to the patient at bedside and discussed with him that his blood count is less than half of what it should be and his risk of bleeding is high and cardiac arrest is high if he goes home.  Patient states that his son needs groceries and he has to leave because he lives with someone scrupulous people that will put him out and that he cannot let them do that.  Discussed with patient that in order to take care of his son he has to take care of himself because if anything happens to him he will not be able to take care of his son.  Patient states he understands that the he will follow-up with his primary doctor.  Advised patient that he needs to follow-up with his GI doctor and needs to have a colonoscopy if he is never had one along with an endoscopy if he has never had one, advised patient that he undergo the CT scan hide and then decide if he still wants to leave AMA.  He said that the Lyft ride does not work all night and I have discussed with nurse to offer possible options after talking to charge nurse about transportation.    >> Chronic systolic congestive heart failure: Patient's weight today is 100.7 kg. Currently patient is euvolemic I have continued his metoprolol  and will resume his Entresto  and Aldactone  once med rec is available to prevent hypotension.    >> Paroxysmal atrial fibrillation: Currently patient is in sinus rhythm and we will continue patient on amiodarone  and metoprolol . With his severe anemia I have currently held his Eliquis . Will resume once patient  is post GI consult.   >> Essential hypertension: Currently blood pressure is low normal we will currently continue on metoprolol    >> Obesity with a BMI of 33.75/diabetes mellitus type 2: Patient is currently on metformin  500 twice daily and Jardiance  10 mg.  I will hold both currently as patient is n.p.o. with Accu-Cheks every 4 hourly.   DVT prophylaxis:  SCDs Consults:  GI Advance Care Planning:    Code Status: Full Code   Family Communication:  None Disposition Plan:  Home Severity of Illness: The appropriate patient status for this patient is INPATIENT. Inpatient status is judged to be reasonable and necessary in order to provide the required intensity of service to ensure the patient's safety. The patient's presenting symptoms, physical exam findings, and initial radiographic and laboratory data in the context of their chronic comorbidities is felt to place them at high risk for further clinical deterioration. Furthermore, it is not anticipated that the patient will be medically stable for discharge from the hospital within 2 midnights of admission.   * I certify that at the point of admission it is my clinical judgment that the patient will require inpatient hospital care spanning beyond 2 midnights from the point of admission due to high intensity of service, high risk for further deterioration and high frequency of surveillance required.*  Unresulted Labs (From admission, onward)     Start     Ordered   03/13/24 0500  CBC  Tomorrow morning,   R        03/12/24 1935   03/13/24 0500  Comprehensive metabolic panel  Tomorrow morning,   R        03/12/24 1935   03/12/24 1943  Hemoglobin and hematocrit, blood  ONCE - STAT,   STAT  03/12/24 1942   03/12/24 1935  HIV Antibody (routine testing w rflx)  (HIV Antibody (Routine testing w reflex) panel)  Once,   R        03/12/24 1935   03/12/24 1933  Sedimentation rate  Add-on,   AD        03/12/24 1935             Meds ordered this encounter  Medications   albuterol  (PROVENTIL ) (2.5 MG/3ML) 0.083% nebulizer solution 3 mL   amiodarone  (PACERONE ) tablet 200 mg   metoprolol  succinate (TOPROL -XL) 24 hr tablet 50 mg    Take with or immediately following a meal.     OR Linked Order Group    acetaminophen  (TYLENOL ) tablet 650 mg    acetaminophen  (TYLENOL ) suppository 650 mg   morphine (PF) 2 MG/ML injection 2 mg   OR Linked Order Group    ondansetron  (ZOFRAN ) tablet 4 mg    ondansetron  (ZOFRAN ) injection 4 mg   sodium chloride  flush (NS) 0.9 % injection 3-10 mL   sodium chloride  flush (NS) 0.9 % injection 3-10 mL   insulin  aspart (novoLOG ) injection 0-9 Units    Correction coverage::   Sensitive (thin, NPO, renal)    CBG < 70::   Implement Hypoglycemia Standing Orders and refer to Hypoglycemia Standing Orders sidebar report    CBG 70 - 120::   0 units    CBG 121 - 150::   1 unit    CBG 151 - 200::   2 units    CBG 201 - 250::   3 units    CBG 251 - 300::   5 units    CBG 301 - 350::   7 units    CBG 351 - 400:   9 units    CBG > 400:   call MD and obtain STAT lab verification     Orders Placed This Encounter  Procedures   DG Chest 2 View   CT Angio Abd/Pel W and/or Wo Contrast   Basic metabolic panel   CBC   Brain natriuretic peptide   Hepatic function panel   Vitamin B12   Folate   Iron  and TIBC   Ferritin   Reticulocytes   Sedimentation rate   HIV Antibody (routine testing w rflx)   CBC   Comprehensive metabolic panel   Hemoglobin and hematocrit, blood   Diet clear liquid Room service appropriate? Yes; Fluid consistency: Thin   Document Height and Actual Weight   If O2 Sat <94% administer O2 at 2 liters/minute via nasal cannula   SCDs   Cardiac Monitoring Continuous x 24 hours Indications for use: Other; other indications for use: Monitor for ischemia   Vital signs   Notify physician (specify)   Mobility Protocol: No Restrictions RN to initiate protocols based on patient's  level of care   Refer to Sidebar Report Refer to ICU, Med-Surg, Progressive, and Step-Down Mobility Protocol Sidebars   Initiate Adult Central Line Maintenance and Catheter Protocol for patients with central line (CVC, PICC, Port, Hemodialysis, Trialysis)   If patient diabetic or glucose greater than 140 notify physician for Sliding Scale Insulin  Orders   Intake and Output   Do not place and if present remove PureWick   Initiate Oral Care Protocol   Initiate Carrier Fluid Protocol   RN may order General Admission PRN Orders utilizing General Admission PRN medications (through manage orders) for the following patient needs: allergy symptoms (Claritin), cold sores (Carmex), cough (Robitussin DM), eye  irritation (Liquifilm Tears), hemorrhoids (Tucks), indigestion (Maalox), minor skin irritation (Hydrocortisone Cream), muscle pain Lucienne Gay), nose irritation (saline nasal spray) and sore throat (Chloraseptic spray).   Apply Diabetes Mellitus Care Plan   STAT CBG when hypoglycemia is suspected. If treated, recheck every 15 minutes after each treatment until CBG >/= 70 mg/dl   Refer to Hypoglycemia Protocol Sidebar Report for treatment of CBG < 70 mg/dl   No HS correction Insulin    Full code   Consult for Unassigned Medical Admission   Consult to gastroenterology   Pulse oximetry check with vital signs   Oxygen therapy Mode or (Route): Nasal cannula; Liters Per Minute: 2; Keep O2 saturation between: greater than 92 %   POC occult blood, ED   ED EKG   Admit to Inpatient (patient's expected length of stay will be greater than 2 midnights or inpatient only procedure)   Aspiration precautions   Fall precautions   VAS US  ABI WITH/WO TBI    Author: Mario LULLA Blanch, MD 12 pm- 8 pm. [facility]. 03/12/2024 7:43 PM Please note for any communication after hours contact [facility] Assigned provider on call on Amion.  At 7:45 PM: Nurse devon came by and said pt wants to leave AMA. I went to the patient  at bedside and discussed with him that his blood count is less than half of what it should be and his risk of bleeding is high and cardiac arrest is high if he goes home.  Patient states that his son needs groceries and he has to leave because he lives with someone scrupulous people that will put him out and that he cannot let them do that.  Discussed with patient that in order to take care of his son he has to take care of himself because if anything happens to him he will not be able to take care of his son.  Patient states he understands that the he will follow-up with his primary doctor.  Advised patient that he needs to follow-up with his GI doctor and needs to have a colonoscopy if he is never had one along with an endoscopy if he has never had one, advised patient that he undergo the CT scan hide and then decide if he still wants to leave AMA.  He said that the Lyft ride does not work all night and I have discussed with nurse to offer possible options after talking to charge nurse about transportation.

## 2024-03-12 NOTE — Progress Notes (Signed)
 VASCULAR LAB    ABI has been performed.  See CV proc for preliminary results.   Demetrice Combes, RVT 03/12/2024, 3:42 PM

## 2024-03-12 NOTE — ED Triage Notes (Signed)
 Pt states he has been having left leg pain since Friday.  Denies swelling to leg.  C/O SHOB.  HX of CHF

## 2024-03-12 NOTE — ED Notes (Signed)
 Introduced self to pt. Vitals updated.

## 2024-03-12 NOTE — ED Provider Triage Note (Signed)
 Emergency Medicine Provider Triage Evaluation Note  Jonathan Gonzalez , a 63 y.o. male  was evaluated in triage.  Pt complains of  Left leg restlessness. Feeling of intense restlessness. Worse at night. Occasional pain. Mild relief with aleve. On eliquis . No sig pain or neuropathy sxs.   Review of Systems  Positive: Leg discomfort Negative: fever  Physical Exam  BP 134/78 (BP Location: Right Arm)   Pulse (!) 120   Temp 98.4 F (36.9 C)   Resp (!) 22   Ht 5' 8 (1.727 m)   Wt 100.7 kg   SpO2 100%   BMI 33.75 kg/m  Gen:   Awake, no distress   Resp:  Normal effort  MSK:   Moves extremities without difficulty  Other:  No sig swelling. Normal strength and reglexes NVI  Medical Decision Making  Medically screening exam initiated at 12:37 PM.  Appropriate orders placed.  Jonathan Gonzalez was informed that the remainder of the evaluation will be completed by another provider, this initial triage assessment does not replace that evaluation, and the importance of remaining in the ED until their evaluation is complete.     Jonathan Chroman, Jonathan Gonzalez 03/12/24 1239

## 2024-03-12 NOTE — ED Provider Notes (Signed)
 [place] EMERGENCY DEPARTMENT AT [facility] Provider Note   CSN: [id number] Arrival date & time: 03/12/24  1145     Patient presents with: Leg Pain   Jonathan Gonzalez is a 63 y.o. male.   63 year old male with a past medical history of type 2 diabetes, atrial fibrillation on Eliquis , CHF, hypertension, and hyperlipidemia presenting to the emergency department today with left leg restlessness and discomfort x 1 week.  He denies any prior symptoms in the past.  He reports shortness of breath with ADLs.  He denies any obvious weight gain, chest pain, palpitations, or numbness in his lower extremities.  He denies any recent medication changes.  He reports that his symptoms are worse at night and are only located in his left thigh.   Leg Pain      Prior to Admission medications   Medication Sig Start Date End Date Taking? Authorizing Provider  albuterol  (VENTOLIN  HFA) 108 (90 Base) MCG/ACT inhaler Inhale 1-2 puffs into the lungs every 6 (six) hours as needed for wheezing or shortness of breath. 08/21/23   Robinson, John K, PA-C  amiodarone  (PACERONE ) 200 MG tablet Take 1 tablet (200 mg total) by mouth daily. 02/06/24   Clegg, Courtany Mcmurphy D, NP  apixaban  (ELIQUIS ) 5 MG TABS tablet Take 1 tablet (5 mg total) by mouth 2 (two) times daily. 02/26/23   Bensimhon, Daniel R, MD  Aspirin-Salicylamide-Caffeine (BC HEADACHE POWDER PO) Take 1 packet by mouth daily as needed (headache, pain).    [provider]  empagliflozin  (JARDIANCE ) 10 MG TABS tablet Take 1 tablet (10 mg total) by mouth daily. 02/14/23   Cindie Ole DASEN, MD  furosemide  (LASIX ) 40 MG tablet Take 1 tablet (40 mg total) by mouth every other day. 02/06/24   Clegg, Madeleyn Schwimmer D, NP  metFORMIN  (GLUCOPHAGE ) 500 MG tablet Take 1 tablet (500 mg total) by mouth 2 (two) times daily with a meal. 02/26/24   Fleming, Zelda W, NP  metoprolol  succinate (TOPROL  XL) 50 MG 24 hr tablet Take 1 tablet (50 mg total) by mouth daily. Take with or  immediately following a meal. 11/22/23   Tillery, Ozell Barter, PA-C  omeprazole  (PRILOSEC) 20 MG capsule Take 1 capsule (20 mg total) by mouth daily. FOR ACID REFLUX 12/28/22   Fleming, Zelda W, NP  sacubitril -valsartan  (ENTRESTO ) 24-26 MG Take 1 tablet by mouth 2 (two) times daily. 12/20/23   Milford, Harlene HERO, FNP  spironolactone  (ALDACTONE ) 25 MG tablet Take 0.5 tablets (12.5 mg total) by mouth daily. 01/09/24   Lee, [place], NP  sulfamethoxazole -trimethoprim  (BACTRIM  DS) 800-160 MG tablet Take 1 tablet by mouth 2 (two) times daily for 7 days. 03/02/24 03/13/24  Theotis Haze ORN, NP  Vitamin D , Ergocalciferol , (DRISDOL ) 1.25 MG (50000 UNIT) CAPS capsule Take 1 capsule (50,000 Units total) by mouth once a week. 02/26/24   Fleming, Zelda W, NP    Allergies: Patient has no known allergies.    Review of Systems  All other systems reviewed and are negative.   Updated Vital Signs BP 131/80 (BP Location: Right Arm)   Pulse 94   Temp 98.1 F (36.7 C) (Oral)   Resp 18   Ht 5' 8 (1.727 m)   Wt 100.7 kg   SpO2 100%   BMI 33.75 kg/m   Physical Exam Vitals reviewed.  Constitutional:      General: He is not in acute distress.    Appearance: He is obese.   Eyes:     Conjunctiva/sclera: Conjunctivae  normal.    Cardiovascular:     Rate and Rhythm: Normal rate.     Pulses: Normal pulses.  Pulmonary:     Effort: No respiratory distress.     Comments: Shortness of breath with conversation Bilateral rhonchi Abdominal:     General: There is distension.     Tenderness: There is no abdominal tenderness.   Musculoskeletal:     Comments: Good distal pulses and perfusion   Skin:    Capillary Refill: Capillary refill takes less than 2 seconds.   Neurological:     Mental Status: He is alert.     (all labs ordered are listed, but only abnormal results are displayed) Labs Reviewed  BASIC METABOLIC PANEL WITH GFR - Abnormal; Notable for the following components:      Result Value    Glucose, Bld 119 (*)    All other components within normal limits  CBC - Abnormal; Notable for the following components:   WBC 10.9 (*)    Hemoglobin 7.0 (*)    HCT 26.6 (*)    MCV 62.0 (*)    MCH 16.3 (*)    MCHC 26.3 (*)    RDW 21.3 (*)    Platelets 556 (*)    nRBC 0.4 (*)    All other components within normal limits  BRAIN NATRIURETIC PEPTIDE  HEPATIC FUNCTION PANEL    EKG: EKG Interpretation Date/Time:  Thursday March 12 2024 12:42:36 EDT Ventricular Rate:  101 PR Interval:  154 QRS Duration:  74 QT Interval:  372 QTC Calculation: 482 R Axis:   21  Text Interpretation: Sinus tachycardia Otherwise normal ECG When compared with ECG of 28-Feb-2024 08:26, PREVIOUS ECG IS PRESENT Confirmed by Corinthia No ([id number]) on 03/12/2024 2:16:53 PM  Radiology: [facility] Chest 2 View Result Date: 03/12/2024 CLINICAL DATA:  Shortness of breath. EXAM: CHEST - 2 VIEW COMPARISON:  August 21, 2023. FINDINGS: The heart size and mediastinal contours are within normal limits. Elevated left hemidiaphragm is again noted. Stable bibasilar densities are noted most consistent with scarring. No definite acute pulmonary abnormality is seen. The visualized skeletal structures are unremarkable. IMPRESSION: No acute abnormality seen.  Chronic findings as noted above. Electronically Signed   By: Lynwood Landy Raddle M.D.   On: 03/12/2024 13:22     Procedures   Medications Ordered in the ED - No data to display  Clinical Course as of 03/15/24 1909  Thu Mar 12, 2024  1752 ED EKG [AL]  1852 Patient states he has never had a colonoscopy.  He was requesting to leave but after extensive conversation regarding the risk of leaving he has now agreed to stay for further evaluation. [AL]  1853 He is refusing Hemoccult testing [AL]    Clinical Course User Index [AL] Fama Muenchow, DO                                 Medical Decision Making 63 year old male presenting with left leg restlessness x 1 week.  Left leg was  evaluated in good distal pulses, perfusion, and I did not appreciate any wounds.  We reviewed his CBC and patient reports that he has not been told about anemia in the past.  However, chart review shows that the provider did note low hemoglobin 2 weeks ago on a workup performed.  His hemoglobin is now 7 and has slowly trended down from priors.  There is a concern about his shortness  of breath is secondary to his anemia.  He does have a history of CHF and this could be secondary to his chronic symptoms.  He reports that his shortness of breath is at his baseline.  Lab work and imaging ordered to rule out any acute process.  Lab work ordered and ABI ultrasound was also included.  He is on anticoagulation from his atrial fibrillation.  The ABI ultrasound used to evaluate for peripheral vascular disease. ABI found noncompressible arterial vessels in the right leg.  Left leg was overall unremarkable. Patient will likely need admission for symptomatic anemia due to his ongoing shortness of breath, periodic dizziness, and restlessness of his lower extremities.  He is denying any abdominal pain or blood in his stool.  He reports that he does have a prior history of a gastric ulcer, however has not had any symptoms from this.  We did not pursue CT imaging since he is denying any current abdominal discomfort. I believe his legs are likely secondary to peripheral vascular disease, however he will need admission for symptomatic anemia.  He will need further evaluation to determine the cause of his anemia. He denied any prior colonoscopies. Pt refused hemoccult testing at this time. Hospitalist team consulted for admission.   Amount and/or Complexity of Data Reviewed Labs: ordered. Radiology: ordered. ECG/medicine tests:  Decision-making details documented in ED Course.  Risk Prescription drug management.     Final diagnoses:  None    ED Discharge Orders     None          Avelina Mcclurkin,  DO 03/15/24 1911

## 2024-03-13 ENCOUNTER — Ambulatory Visit (HOSPITAL_COMMUNITY)

## 2024-03-16 ENCOUNTER — Ambulatory Visit (HOSPITAL_COMMUNITY)

## 2024-03-18 ENCOUNTER — Other Ambulatory Visit: Payer: Self-pay | Admitting: Nurse Practitioner

## 2024-03-18 ENCOUNTER — Ambulatory Visit (HOSPITAL_COMMUNITY)

## 2024-03-18 ENCOUNTER — Other Ambulatory Visit (HOSPITAL_COMMUNITY): Payer: Self-pay | Admitting: Internal Medicine

## 2024-03-18 DIAGNOSIS — I480 Paroxysmal atrial fibrillation: Secondary | ICD-10-CM

## 2024-03-18 DIAGNOSIS — R399 Unspecified symptoms and signs involving the genitourinary system: Secondary | ICD-10-CM

## 2024-03-19 ENCOUNTER — Other Ambulatory Visit: Payer: Self-pay

## 2024-03-19 MED ORDER — APIXABAN 5 MG PO TABS
5.0000 mg | ORAL_TABLET | Freq: Two times a day (BID) | ORAL | 11 refills | Status: DC
Start: 2024-03-19 — End: 2024-05-13
  Filled 2024-03-19: qty 60, 30d supply, fill #0

## 2024-03-23 ENCOUNTER — Ambulatory Visit (HOSPITAL_COMMUNITY)

## 2024-03-24 ENCOUNTER — Other Ambulatory Visit: Payer: Self-pay

## 2024-03-25 ENCOUNTER — Other Ambulatory Visit: Payer: Self-pay

## 2024-03-25 ENCOUNTER — Ambulatory Visit (HOSPITAL_COMMUNITY)

## 2024-03-27 ENCOUNTER — Ambulatory Visit (HOSPITAL_COMMUNITY)

## 2024-03-30 ENCOUNTER — Ambulatory Visit (HOSPITAL_COMMUNITY)

## 2024-03-31 ENCOUNTER — Encounter (HOSPITAL_COMMUNITY): Admitting: Internal Medicine

## 2024-03-31 NOTE — Addendum Note (Signed)
 Encounter addended by: Serena Morna SAUNDERS, [facility] on: 03/31/2024 6:04 PM  Actions taken: Delete clinical note

## 2024-04-01 ENCOUNTER — Other Ambulatory Visit (HOSPITAL_COMMUNITY)

## 2024-04-01 ENCOUNTER — Encounter (HOSPITAL_COMMUNITY): Admitting: Internal Medicine

## 2024-04-01 ENCOUNTER — Ambulatory Visit (HOSPITAL_COMMUNITY)

## 2024-04-02 ENCOUNTER — Other Ambulatory Visit: Payer: Self-pay

## 2024-04-02 ENCOUNTER — Ambulatory Visit (HOSPITAL_COMMUNITY)
Admission: RE | Admit: 2024-04-02 | Discharge: 2024-04-02 | Disposition: A | Discharge status: Home / Self Care | Source: Ambulatory Visit | Attending: Internal Medicine | Admitting: Internal Medicine

## 2024-04-02 ENCOUNTER — Encounter (HOSPITAL_COMMUNITY): Payer: Self-pay | Admitting: Internal Medicine

## 2024-04-02 ENCOUNTER — Ambulatory Visit (HOSPITAL_COMMUNITY): Payer: Self-pay | Admitting: Adult Health

## 2024-04-02 VITALS — BP 128/80 | HR 103 | Ht 68.0 in | Wt 217.8 lb

## 2024-04-02 DIAGNOSIS — I10 Essential (primary) hypertension: Secondary | ICD-10-CM | POA: Diagnosis not present

## 2024-04-02 DIAGNOSIS — I5022 Chronic systolic (congestive) heart failure: Secondary | ICD-10-CM | POA: Insufficient documentation

## 2024-04-02 DIAGNOSIS — I110 Hypertensive heart disease with heart failure: Secondary | ICD-10-CM | POA: Insufficient documentation

## 2024-04-02 DIAGNOSIS — I4891 Unspecified atrial fibrillation: Secondary | ICD-10-CM | POA: Diagnosis not present

## 2024-04-02 DIAGNOSIS — R000 Tachycardia, unspecified: Secondary | ICD-10-CM | POA: Insufficient documentation

## 2024-04-02 DIAGNOSIS — N1831 Chronic kidney disease, stage 3a: Secondary | ICD-10-CM

## 2024-04-02 LAB — ECHOCARDIOGRAM COMPLETE
Area-P 1/2: 4.49 cm2
S' Lateral: 2.6 cm
Single Plane A4C EF: 60.8 %

## 2024-04-02 MED ORDER — AMIODARONE HCL 200 MG PO TABS
200.0000 mg | ORAL_TABLET | Freq: Every day | ORAL | 5 refills | Status: DC
Start: 2024-04-02 — End: 2024-05-13
  Filled 2024-04-02: qty 30, 30d supply, fill #0

## 2024-04-02 NOTE — Progress Notes (Signed)
 ADVANCED HF CLINIC NOTE  Primary Care: Haze Servant, NP Primary Cardiologist: Dr. Jeffrie HF Cardiologist: Dr. Cherrie  Reason for Visit: Heart Failure  HPI: Jonathan Gonzalez is a 63 y.o. male with history of HFrEF, PAF(diagnosed 12/23), HTN, HLD, DM, NSVT. and chronic HFrEF.   Patient admitted 09/14/22 with new afib with RVR. Treated with rate control. 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 Echo demonstrated EF 45-50%, RV okay, severe LAE. Plan was for cardioversion at a later date but cancelled the procedure d/t passing of his wife. He later missed doses of eliquis  and ran out of metoprolol  and farxiga . He was admitted with Afib with RVR and a/c CHF in March 2024. EF down slightly further to 35-40%. He was diuresed and GDMT titrated. He underwent successful DCCV during the admission.   TOC Clinic 01/04/23 and was back in AF. Started on amio and underwent outpatient TEE/DCVV on 01/15/23.   Admitted 6/24 from AHF clinic with AF with RVR and a/c dHF. Started on amiodarone  gtt, diuresed and underwent DCCV to NSR.  Today he returns for HF follow up with his son.  Remains short of breath exertion but says this is his baseline. SOB with steps. Denies PND/Orthopnea. Appetite ok. No fever or chills. Taking all medications. Lives with his autistic son in a motel.   Cardiac Studies - TEE 4/24: EF 30%, severe LAE, mild Jonathan - Echo 3/24: EF 35-40% - Echo 12/23: EF 45%, normal RV  -Zio 01/2024   Predominant underlying rhythm was Sinus Rhythm. 1 run of Ventricular Tachycardia occurred lasting 4 beats with a max rate of 194 bpm (avg 172 bpm). 9 Supraventricular Tachycardia runs occurred, the run with the fastest interval lasting 4 beats with a max rate of 145 bpm, the longest lasting 5 beats with an avg rate of 108 bpm. Isolated SVEs were rare (<1.0%), SVE Couplets were rare (<1.0%), and SVE Triplets were rare (<1.0%).  Isolated VEs were rare (<1.0%), VE Couplets were rare (<1.0%),  and no VE Triplets were present.   Past Medical History:  Diagnosis Date   A-fib (HCC)    Asthma    Diabetes mellitus without complication (HCC)    GERD (gastroesophageal reflux disease)    Hyperlipidemia    Hypertension    Current Outpatient Medications  Medication Sig Dispense Refill   albuterol  (VENTOLIN  HFA) 108 (90 Base) MCG/ACT inhaler Inhale 1-2 puffs into the lungs every 6 (six) hours as needed for wheezing or shortness of breath. 18 g 2   amiodarone  (PACERONE ) 200 MG tablet Take 1 tablet (200 mg total) by mouth daily. 90 tablet 3   apixaban  (ELIQUIS ) 5 MG TABS tablet Take 1 tablet (5 mg total) by mouth 2 (two) times daily. 60 tablet 5   Aspirin-Salicylamide-Caffeine (BC HEADACHE POWDER PO) Take 1 packet by mouth daily as needed (headache, pain).     empagliflozin  (JARDIANCE ) 10 MG TABS tablet Take 1 tablet (10 mg total) by mouth daily. 90 tablet 3   furosemide  (LASIX ) 40 MG tablet Take 1 tablet (40 mg total) by mouth daily as needed. For 3 lb weight gain overnight or 5 lbs in one week 90 tablet 1   metFORMIN  (GLUCOPHAGE ) 500 MG tablet Take 1 tablet (500 mg total) by mouth 2 (two) times daily with a meal. (Patient taking differently: Take 500 mg by mouth 2 (two) times daily with a meal. Daily) 60 tablet 4   metoprolol  succinate (TOPROL  XL) 50 MG 24 hr tablet Take 1 tablet (  50 mg total) by mouth daily. Take with or immediately following a meal. 90 tablet 1   omeprazole  (PRILOSEC) 20 MG capsule Take 1 capsule (20 mg total) by mouth daily. FOR ACID REFLUX (Patient taking differently: Take 20 mg by mouth daily. FOR ACID REFLUX AS NEEDED) 90 capsule 1   sacubitril -valsartan  (ENTRESTO ) 24-26 MG Take 1 tablet by mouth 2 (two) times daily. 60 tablet 11   Vitamin D , Ergocalciferol , (DRISDOL ) 1.25 MG (50000 UNIT) CAPS capsule Take 1 capsule (50,000 Units total) by mouth once a week. (Patient taking differently: Take 50,000 Units by mouth once a week. Takes on Tuesdays) 12 capsule 0    nitrofurantoin , macrocrystal-monohydrate, (MACROBID ) 100 MG capsule Take 1 capsule (100 mg total) by mouth 2 (two) times daily for 7 days. 14 capsule 0   No current facility-administered medications for this encounter.   No Known Allergies  Social History   Socioeconomic History   Marital status: Widowed    Spouse name: Not on file   Number of children: 1   Years of education: Not on file   Highest education level: 12th grade  Occupational History   Occupation: [employer]  Tobacco Use   Smoking status: Never   Smokeless tobacco: Never   Tobacco comments:    Never smoke 10/08/22  Vaping Use   Vaping status: Never Used  Substance and Sexual Activity   Alcohol use: Never   Drug use: Never   Sexual activity: Not Currently  Other Topics Concern   Not on file  Social History Narrative   Not on file   Social Drivers of Health   Financial Resource Strain: High Risk (11/25/2023)   Overall Financial Resource Strain (CARDIA)    Difficulty of Paying Living Expenses: Hard  Food Insecurity: No Food Insecurity (11/25/2023)   Hunger Vital Sign    Worried About Running Out of Food in the Last Year: Never true    Ran Out of Food in the Last Year: Never true  Transportation Needs: Unmet Transportation Needs (01/07/2024)   PRAPARE - Transportation    Lack of Transportation (Medical): Yes    Lack of Transportation (Non-Medical): Yes  Physical Activity: Inactive (11/25/2023)   Exercise Vital Sign    Days of Exercise per Week: 0 days    Minutes of Exercise per Session: 0 min  Stress: Stress Concern Present (11/25/2023)   [employer] of Occupational Health - Occupational Stress Questionnaire    Feeling of Stress : Very much  Social Connections: Socially Isolated (11/25/2023)   Social Connection and Isolation Panel [NHANES]    Frequency of Communication with Friends and Family: Once a week    Frequency of Social Gatherings with Friends and Family: Never    Attends Religious  Services: Never    Database administrator or Organizations: No    Attends Engineer, structural: Not on file    Marital Status: Widowed  Intimate Partner Violence: Not At Risk (12/04/2022)   Humiliation, Afraid, Rape, and Kick questionnaire    Fear of Current or Ex-Partner: No    Emotionally Abused: No    Physically Abused: No    Sexually Abused: No   No family history on file.  Filed Weights   04/02/24 1349  Weight: 98.8 kg (217 lb 12.8 oz)    Vitals:   04/02/24 1349  BP: 128/80  Pulse: (!) 103  SpO2: 93%   Wt Readings from Last 3 Encounters:  04/02/24 98.8 kg (217 lb 12.8 oz)  03/12/24 100.7 kg (222 lb)  02/28/24 101.8 kg (224 lb 8 oz)    PHYSICAL EXAM: General:   No resp difficulty Neck: . no JVD.  Cor: . Regular rate & rhythm. No rubs, gallops or murmurs. Lungs: clear Abdomen: soft, nontender, nondistended.  Extremities: no cyanosis, clubbing, rash, edema Neuro: alert & oriented x3    EKG: ST 107 bpm  ASSESSMENT & PLAN: 1. PAF  - Successful DCCV 3/24, 4/24 & 6/24 EKG today. Sinus Tach  - Refill amio today. Continue amio 200 mg daily.  - Continue Eliquis  5 mg bid - Refer to EP for possible ablation.  - Would benefit from sleep study but he is unable to stay over night in sleep lab because he is the primary care giver for his Autistic son.    2. Chronic systolic HF - Suspect possibly tachy-mediated. Discovered in setting of Afib with RVR in 08/2022.  - Echo 12/23: EF 45-50%, RV okay, severe LAE. No prior echo for comparison. - Echo 3/24 in setting of Afib with RVR: EF 35-40% - TEE 4/24 EF 30% - NYHA III. Appears euvolemic   -Continue Entresto  24/26 mg bid -  Continue Toprol  XL 50 mg daily - Continue Jardiance  10 mg daily - Continue Spiro 12.5 mg daily -Echo today- EF improved 65-70%. Dr Cherrie discussed and reviewed.    3. HTN - Stable. Continue current regimen.  - Sleep study discussed as noted above.    4. HLD - Continue atorvastatin .     5. CKD: Stage 3 - Baseline Scr ~1.5.  - Continue SGLT2i - Renal function.   6. SDOH - He has disability Has transportation needs - Lives in hotel with autistic son  Follow up 6 months    Amy Clegg, NP-C  Patient seen and examined with the above-signed Systems developer and/or Housestaff. I personally reviewed laboratory data, imaging studies and relevant notes. I independently examined the patient and formulated the important aspects of the plan. I have edited the note to reflect any of my changes or salient points. I have personally discussed the plan with the patient and/or family.  Overall stable. NYHA I-II. Remains in NSR on amio. Still living in hotel and caring for his adult autisitc son.   Echo today EF 65-70% Personally reviewed  General:  Well appearing. No resp difficulty HEENT: normal Neck: supple. no JVD. Carotids 2+ bilat; no bruits. No lymphadenopathy or thryomegaly appreciated. Cor: PMI nondisplaced. Regular rate & rhythm. No rubs, gallops or murmurs. Lungs: clear Abdomen: soft, nontender, nondistended. No hepatosplenomegaly. No bruits or masses. Good bowel sounds. Extremities: no cyanosis, clubbing, rash, edema Neuro: alert & orientedx3, cranial nerves grossly intact. moves all 4 extremities w/o difficulty. Affect pleasant  EF has normalized with GDMT, control of BP and maintenance of SR.   Unable to get sleep study as above.  Will refill meds. Refer to EP for possible AF ablation. Continue amio for now.   Toribio Cherrie, MD  8:36 PM

## 2024-04-02 NOTE — Patient Instructions (Signed)
 Medication Changes:  AMIODARONE  REFILLED   Referrals:  YOU HAVE BEEN REFERRED TO ELECTROPHYSIOLOGY THEY WILL REACH OUT TO YOU OR CALL TO ARRANGE THIS. PLEASE CALL US  WITH ANY CONCERNS   Follow-Up in: 6 MONTHS AS SCHEDULED   At the [facility], you and your health needs are our priority. We have a designated team specialized in the treatment of Heart Failure. This Care Team includes your primary Heart Failure Specialized Cardiologist (physician), Advanced Practice Providers (APPs- Physician Assistants and Nurse Practitioners), and Pharmacist who all work together to provide you with the care you need, when you need it.   You may see any of the following providers on your designated Care Team at your next follow up:  Dr. Toribio Fuel Dr. Ezra Shuck Dr. Ria Commander Dr. Odis Brownie Greig Mosses, NP Caffie Shed, [place] [facility] [place], [place] Beckey Coe, NP [place] Lee, NP Tinnie Redman, PharmD   Please be sure to bring in all your medications bottles to every appointment.   Need to Contact Us :  If you have any questions or concerns before your next appointment please send us  a message through [place] or call our office at [phone].    TO LEAVE A MESSAGE FOR THE NURSE SELECT OPTION 2, PLEASE LEAVE A MESSAGE INCLUDING: YOUR NAME DATE OF BIRTH CALL BACK NUMBER REASON FOR CALL**this is important as we prioritize the call backs  YOU WILL RECEIVE A CALL BACK THE SAME DAY AS LONG AS YOU CALL BEFORE 4:00 PM

## 2024-04-02 NOTE — Progress Notes (Signed)
  Echocardiogram 2D Echocardiogram has been performed.  Jonathan Gonzalez 04/02/2024, 1:40 PM

## 2024-04-03 ENCOUNTER — Ambulatory Visit (HOSPITAL_COMMUNITY)

## 2024-04-06 ENCOUNTER — Ambulatory Visit (HOSPITAL_COMMUNITY)

## 2024-04-08 ENCOUNTER — Ambulatory Visit (HOSPITAL_COMMUNITY)

## 2024-04-10 ENCOUNTER — Ambulatory Visit (HOSPITAL_COMMUNITY)

## 2024-04-13 ENCOUNTER — Other Ambulatory Visit: Payer: Self-pay

## 2024-04-13 ENCOUNTER — Ambulatory Visit (HOSPITAL_COMMUNITY)

## 2024-04-15 ENCOUNTER — Ambulatory Visit (HOSPITAL_COMMUNITY)

## 2024-04-29 NOTE — Progress Notes (Signed)
  Electrophysiology Office Follow up Visit Note:    Date:  04/30/2024   ID:  Jonathan Gonzalez, DOB 1961/08/25, MRN [id number]  PCP:  Jonathan Gonzalez ORN, NP  [facility] Cardiologist:  Oneil Parchment, MD  [facility] Electrophysiologist:  Jonathan ONEIDA HOLTS, MD    Interval History:     Jonathan Gonzalez is a 63 y.o. male who presents for a follow up visit.   I last saw the patient Feb 14, 2023.  He has persistent atrial fibrillation that was poorly controlled at the time of our last appointment.  He was originally prescribed amiodarone  but he was not taking it.  I gave him a new prescription for amiodarone  at the last appointment.  Today he is with his son in clinic.  He feels very poorly.  He is short of breath.  He has trouble doing simple tasks like combing his hair or taking a shower.  He has not noticed any obvious signs of bleeding.  He is concerned that his peptic ulcer has come back.       Past medical, surgical, social and family history were reviewed.  ROS:   Please see the history of present illness.    All other systems reviewed and are negative.  EKGs/Labs/Other Studies Reviewed:    The following studies were reviewed today:  May 2025 ZIO monitor Dominant rhythm was sinus.  No atrial fibrillation  April 02, 2024 echo EF 65% RV normal Trivial MR         Physical Exam:    VS:  BP 126/72 (BP Location: Right Arm, Patient Position: Sitting, Cuff Size: Large)   Pulse (!) 109   Ht 5' 8 (1.727 m)   Wt 220 lb (99.8 kg)   SpO2 97%   BMI 33.45 kg/m     Wt Readings from Last 3 Encounters:  04/30/24 220 lb (99.8 kg)  04/02/24 217 lb 12.8 oz (98.8 kg)  03/12/24 222 lb (100.7 kg)     GEN: no distress.  Mild shortness of breath during discussion. CARD: Tachycardic, regular rhythm, No MRG RESP: No IWOB. CTAB.      ASSESSMENT:    1. Chronic systolic heart failure (HCC)   2. Persistent atrial fibrillation (HCC)    PLAN:    In order of problems listed  above:  #Severe anemia #Shortness of breath I am concerned that his anemia has progressed.  Possible sources would be a bleeding peptic ulcer given his history.  He is on a blood thinner.  I have recommended that he present immediately to the emergency department.  He is in agreement.  I offered to call 911 and have an ambulance to transport him but he declined.  He tells me that he is going to walk across the street with his son.  I asked him multiple times and he repeatedly said that he will walk.  #Persistent atrial fibrillation #High risk drug monitoring-amiodarone  Previously associated with reduced ejection fraction.  Has led to multiple hospitalizations.    #Anemia Recent hemoglobin 7. Referral to emergency department.     Signed, Jonathan HOLTS, MD, [facility], [facility] 04/30/2024 9:29 AM    Electrophysiology [facility]

## 2024-04-30 ENCOUNTER — Ambulatory Visit: Admitting: Cardiology

## 2024-04-30 ENCOUNTER — Inpatient Hospital Stay (HOSPITAL_COMMUNITY)
Admission: EM | Admit: 2024-04-30 | Discharge: 2024-05-02 | DRG: 812 | Disposition: A | Discharge status: Home / Self Care | Source: Ambulatory Visit | Attending: Family Medicine | Admitting: Family Medicine

## 2024-04-30 ENCOUNTER — Encounter (HOSPITAL_COMMUNITY): Payer: Self-pay | Admitting: Emergency Medicine

## 2024-04-30 ENCOUNTER — Encounter: Payer: Self-pay | Admitting: Cardiology

## 2024-04-30 ENCOUNTER — Other Ambulatory Visit: Payer: Self-pay

## 2024-04-30 ENCOUNTER — Emergency Department (HOSPITAL_COMMUNITY)

## 2024-04-30 VITALS — BP 126/72 | HR 109 | Ht 68.0 in | Wt 220.0 lb

## 2024-04-30 DIAGNOSIS — E119 Type 2 diabetes mellitus without complications: Secondary | ICD-10-CM | POA: Diagnosis present

## 2024-04-30 DIAGNOSIS — Z7901 Long term (current) use of anticoagulants: Secondary | ICD-10-CM | POA: Diagnosis not present

## 2024-04-30 DIAGNOSIS — I110 Hypertensive heart disease with heart failure: Secondary | ICD-10-CM | POA: Diagnosis present

## 2024-04-30 DIAGNOSIS — K635 Polyp of colon: Secondary | ICD-10-CM | POA: Diagnosis not present

## 2024-04-30 DIAGNOSIS — K2970 Gastritis, unspecified, without bleeding: Secondary | ICD-10-CM | POA: Diagnosis present

## 2024-04-30 DIAGNOSIS — K922 Gastrointestinal hemorrhage, unspecified: Secondary | ICD-10-CM | POA: Diagnosis not present

## 2024-04-30 DIAGNOSIS — K640 First degree hemorrhoids: Secondary | ICD-10-CM | POA: Diagnosis present

## 2024-04-30 DIAGNOSIS — D509 Iron deficiency anemia, unspecified: Principal | ICD-10-CM | POA: Diagnosis present

## 2024-04-30 DIAGNOSIS — E669 Obesity, unspecified: Secondary | ICD-10-CM | POA: Diagnosis present

## 2024-04-30 DIAGNOSIS — K5730 Diverticulosis of large intestine without perforation or abscess without bleeding: Secondary | ICD-10-CM | POA: Diagnosis present

## 2024-04-30 DIAGNOSIS — K2270 Barrett's esophagus without dysplasia: Secondary | ICD-10-CM | POA: Diagnosis present

## 2024-04-30 DIAGNOSIS — I4819 Other persistent atrial fibrillation: Secondary | ICD-10-CM | POA: Diagnosis present

## 2024-04-30 DIAGNOSIS — K219 Gastro-esophageal reflux disease without esophagitis: Secondary | ICD-10-CM | POA: Diagnosis present

## 2024-04-30 DIAGNOSIS — E785 Hyperlipidemia, unspecified: Secondary | ICD-10-CM | POA: Diagnosis present

## 2024-04-30 DIAGNOSIS — J45901 Unspecified asthma with (acute) exacerbation: Secondary | ICD-10-CM | POA: Diagnosis present

## 2024-04-30 DIAGNOSIS — Z6833 Body mass index (BMI) 33.0-33.9, adult: Secondary | ICD-10-CM

## 2024-04-30 DIAGNOSIS — Z79899 Other long term (current) drug therapy: Secondary | ICD-10-CM

## 2024-04-30 DIAGNOSIS — D649 Anemia, unspecified: Secondary | ICD-10-CM | POA: Diagnosis not present

## 2024-04-30 DIAGNOSIS — Z8711 Personal history of peptic ulcer disease: Secondary | ICD-10-CM

## 2024-04-30 DIAGNOSIS — R195 Other fecal abnormalities: Secondary | ICD-10-CM | POA: Diagnosis not present

## 2024-04-30 DIAGNOSIS — Z604 Social exclusion and rejection: Secondary | ICD-10-CM | POA: Diagnosis present

## 2024-04-30 DIAGNOSIS — Z66 Do not resuscitate: Secondary | ICD-10-CM | POA: Diagnosis present

## 2024-04-30 DIAGNOSIS — I5022 Chronic systolic (congestive) heart failure: Secondary | ICD-10-CM

## 2024-04-30 DIAGNOSIS — R062 Wheezing: Secondary | ICD-10-CM

## 2024-04-30 DIAGNOSIS — Z5986 Financial insecurity: Secondary | ICD-10-CM | POA: Diagnosis not present

## 2024-04-30 DIAGNOSIS — Z7984 Long term (current) use of oral hypoglycemic drugs: Secondary | ICD-10-CM | POA: Diagnosis not present

## 2024-04-30 DIAGNOSIS — I4891 Unspecified atrial fibrillation: Secondary | ICD-10-CM | POA: Diagnosis not present

## 2024-04-30 DIAGNOSIS — D500 Iron deficiency anemia secondary to blood loss (chronic): Secondary | ICD-10-CM

## 2024-04-30 DIAGNOSIS — Z87440 Personal history of urinary (tract) infections: Secondary | ICD-10-CM

## 2024-04-30 DIAGNOSIS — K449 Diaphragmatic hernia without obstruction or gangrene: Secondary | ICD-10-CM | POA: Diagnosis present

## 2024-04-30 DIAGNOSIS — D124 Benign neoplasm of descending colon: Secondary | ICD-10-CM | POA: Diagnosis present

## 2024-04-30 DIAGNOSIS — K2289 Other specified disease of esophagus: Secondary | ICD-10-CM | POA: Diagnosis present

## 2024-04-30 DIAGNOSIS — Z5982 Transportation insecurity: Secondary | ICD-10-CM

## 2024-04-30 DIAGNOSIS — K3189 Other diseases of stomach and duodenum: Secondary | ICD-10-CM | POA: Diagnosis not present

## 2024-04-30 DIAGNOSIS — Z789 Other specified health status: Secondary | ICD-10-CM

## 2024-04-30 LAB — CBC
HCT: 26.1 % — ABNORMAL LOW (ref 39.0–52.0)
Hemoglobin: 6.5 g/dL — CL (ref 13.0–17.0)
MCH: 15.1 pg — ABNORMAL LOW (ref 26.0–34.0)
MCHC: 24.9 g/dL — ABNORMAL LOW (ref 30.0–36.0)
MCV: 60.7 fL — ABNORMAL LOW (ref 80.0–100.0)
Platelets: 227 K/uL (ref 150–400)
RBC: 4.3 MIL/uL (ref 4.22–5.81)
RDW: 23.5 % — ABNORMAL HIGH (ref 11.5–15.5)
WBC: 9.6 K/uL (ref 4.0–10.5)
nRBC: 0 % (ref 0.0–0.2)

## 2024-04-30 LAB — IRON AND TIBC
Iron: 15 ug/dL — ABNORMAL LOW (ref 45–182)
Saturation Ratios: 3 % — ABNORMAL LOW (ref 17.9–39.5)
TIBC: 498 ug/dL — ABNORMAL HIGH (ref 250–450)
UIBC: 483 ug/dL

## 2024-04-30 LAB — BASIC METABOLIC PANEL WITH GFR
Anion gap: 10 (ref 5–15)
BUN: 11 mg/dL (ref 8–23)
CO2: 22 mmol/L (ref 22–32)
Calcium: 9.1 mg/dL (ref 8.9–10.3)
Chloride: 109 mmol/L (ref 98–111)
Creatinine, Ser: 0.93 mg/dL (ref 0.61–1.24)
GFR, Estimated: >60 mL/min (ref >60)
Glucose, Bld: 145 mg/dL — ABNORMAL HIGH (ref 70–99)
Potassium: 3.8 mmol/L (ref 3.5–5.1)
Sodium: 141 mmol/L (ref 135–145)

## 2024-04-30 LAB — POC OCCULT BLOOD, ED: Fecal Occult Bld: NEGATIVE

## 2024-04-30 LAB — HIV ANTIBODY (ROUTINE TESTING W REFLEX): HIV Screen 4th Generation wRfx: NONREACTIVE

## 2024-04-30 LAB — FERRITIN: Ferritin: 3 ng/mL — ABNORMAL LOW (ref 24–336)

## 2024-04-30 LAB — TROPONIN I (HIGH SENSITIVITY)
Troponin I (High Sensitivity): 9 ng/L (ref <18)
Troponin I (High Sensitivity): 9 ng/L (ref <18)

## 2024-04-30 LAB — PREPARE RBC (CROSSMATCH)

## 2024-04-30 LAB — BRAIN NATRIURETIC PEPTIDE: B Natriuretic Peptide: 24.2 pg/mL (ref 0.0–100.0)

## 2024-04-30 LAB — ABO/RH: ABO/RH(D): O POS

## 2024-04-30 MED ORDER — IPRATROPIUM-ALBUTEROL 0.5-2.5 (3) MG/3ML IN SOLN
3.0000 mL | Freq: Once | RESPIRATORY_TRACT | Status: AC
  Administered 2024-04-30: 3 mL via RESPIRATORY_TRACT
  Filled 2024-04-30: qty 3

## 2024-04-30 MED ORDER — SODIUM CHLORIDE 0.9% IV SOLUTION: Freq: Once | INTRAVENOUS | Status: AC

## 2024-04-30 MED ORDER — ALBUTEROL SULFATE (2.5 MG/3ML) 0.083% IN NEBU: 2.5000 mg | INHALATION_SOLUTION | Freq: Four times a day (QID) | RESPIRATORY_TRACT | Status: DC | PRN

## 2024-04-30 MED ORDER — SPIRONOLACTONE 12.5 MG HALF TABLET
12.5000 mg | ORAL_TABLET | Freq: Every day | ORAL | Status: DC
  Administered 2024-04-30 – 2024-05-02 (×3): 12.5 mg via ORAL
  Filled 2024-04-30 (×3): qty 1

## 2024-04-30 MED ORDER — PANTOPRAZOLE SODIUM 40 MG IV SOLR
40.0000 mg | Freq: Two times a day (BID) | INTRAVENOUS | Status: DC
  Administered 2024-04-30 – 2024-05-02 (×4): 40 mg via INTRAVENOUS
  Filled 2024-04-30 (×4): qty 10

## 2024-04-30 MED ORDER — ACETAMINOPHEN 650 MG RE SUPP: 650.0000 mg | Freq: Four times a day (QID) | RECTAL | Status: DC | PRN

## 2024-04-30 MED ORDER — SACUBITRIL-VALSARTAN 24-26 MG PO TABS
1.0000 | ORAL_TABLET | Freq: Two times a day (BID) | ORAL | Status: DC
  Administered 2024-04-30 – 2024-05-02 (×5): 1 via ORAL
  Filled 2024-04-30 (×5): qty 1

## 2024-04-30 MED ORDER — METOPROLOL TARTRATE 25 MG PO TABS
25.0000 mg | ORAL_TABLET | Freq: Two times a day (BID) | ORAL | Status: DC
  Administered 2024-04-30 – 2024-05-02 (×5): 25 mg via ORAL
  Filled 2024-04-30 (×5): qty 1

## 2024-04-30 MED ORDER — ACETAMINOPHEN 325 MG PO TABS: 650.0000 mg | ORAL_TABLET | Freq: Four times a day (QID) | ORAL | Status: DC | PRN

## 2024-04-30 MED ORDER — IPRATROPIUM-ALBUTEROL 0.5-2.5 (3) MG/3ML IN SOLN
3.0000 mL | RESPIRATORY_TRACT | Status: DC | PRN
  Administered 2024-04-30: 3 mL via RESPIRATORY_TRACT
  Filled 2024-04-30: qty 3

## 2024-04-30 MED ORDER — AMIODARONE HCL 200 MG PO TABS
200.0000 mg | ORAL_TABLET | Freq: Every day | ORAL | Status: DC
  Administered 2024-04-30 – 2024-05-02 (×3): 200 mg via ORAL
  Filled 2024-04-30 (×3): qty 1

## 2024-04-30 NOTE — Consult Note (Addendum)
 Consultation  Referring Provider: ER MD/[place] Primary Care Physician:  Theotis Haze ORN, NP Primary Gastroenterologist: [place]  Reason for Consultation: Severe anemia microcytic symptomatic with fatigue and dyspnea  HPI: Jonathan Gonzalez is a 64 y.o. male with history of atrial fibrillation for which he is on Eliquis , and chronic systolic heart failure who was actually seen by his cardiologist Dr. Cindie earlier today for regular follow-up.  Patient had complained of progressive dyspnea over the past several weeks. Outpatient labs from 03/12/2024 showed hemoglobin of 7/hematocrit 26.6/MCV 62/platelets 556. He had was advised by cardiology to come to the emergency room for further evaluation.  Reviewing his recent labs-hemoglobin was 9.9 in April 2025 and 12.7 in December 2024.  Workup in the ER today with hemoglobin 6.5/hematocrit 26.1/MCV 60.7/platelets 227 Troponins are negative Potassium 3.8/BUN 11/creatinine 0.93 Stool for occult blood negative BNP 24.2 Ferritin 3/serum iron  15/TIBC 498/sat 3  Chest x-ray negative-known paraesophageal hernia within the left chest  He is currently being transfused 1 unit of packed RBCs  He is unaware of any previous history of problems with anemia.  He says occasionally he has some mid lower abdominal pain/discomfort but that has not been constant.  He has not noticed any changes in his bowel habits, has been having regular bowel movements, and has not noted any melena or hematochezia.  Appetite has been okay, no nausea or vomiting, no regular dysphagia.  He says long-term he occasionally has sensation of food sticking but this has not required regurgitation.  He says he was told a long time ago that he had a hiatal hernia. He also was told remotely i.e. 30 years ago that he had an ulcer, did not require surgery or EGD. He states that he used to drink a lot and had taken [facility] powders on a regular basis years back but has not been drinking or  using BC's in the past several years.  He takes occasional Aleve or BC but nothing on a regular basis and only for headaches.  No EtOH since the 1990s. No prior colonoscopy or EGD Does relate that he recently lost his wife, and that he and his son live together and take care of each other. Denies any recent chest pain, has just been feeling progressively fatigued and short of breath over the past several weeks..  Last echo 04/02/2024 EF 65 to 70% grade 1 diastolic dysfunction, no aortic stenosis  Last dose of Eliquis  was last evening.  CT of the abdomen pelvis was done 03/12/2024 due to concerns for GI bleeding.  This showed a normal-appearing stomach, no evidence of bowel wall thickening distention or inflammatory changes descending and sigmoid diverticulosis.  He does have a very large incompletely imaged paraesophageal hernia with complete intrathoracic position of the stomach and a large portion of the pancreas   Past Medical History:  Diagnosis Date   A-fib (HCC)    Asthma    Diabetes mellitus without complication (HCC)    GERD (gastroesophageal reflux disease)    Hyperlipidemia    Hypertension     Past Surgical History:  Procedure Laterality Date   BUBBLE STUDY  12/06/2022   Procedure: BUBBLE STUDY;  Surgeon: Loni Soyla LABOR, MD;  Location: [facility] ENDOSCOPY;  Service: Cardiovascular;;   CARDIOVERSION N/A 12/06/2022   Procedure: CARDIOVERSION;  Surgeon: Loni Soyla LABOR, MD;  Location: [facility] ENDOSCOPY;  Service: Cardiovascular;  Laterality: N/A;   CARDIOVERSION N/A 01/15/2023   Procedure: CARDIOVERSION;  Surgeon: Cherrie Toribio SAUNDERS, MD;  Location: [facility]  INVASIVE CV LAB;  Service: Cardiovascular;  Laterality: N/A;   CARDIOVERSION N/A 02/25/2023   Procedure: CARDIOVERSION;  Surgeon: Cherrie Toribio SAUNDERS, MD;  Location: [facility] INVASIVE CV LAB;  Service: Cardiovascular;  Laterality: N/A;   HERNIA REPAIR     TEE WITHOUT CARDIOVERSION N/A 12/06/2022   Procedure: TRANSESOPHAGEAL ECHOCARDIOGRAM (TEE);   Surgeon: Loni Soyla LABOR, MD;  Location: [facility] ENDOSCOPY;  Service: Cardiovascular;  Laterality: N/A;   TEE WITHOUT CARDIOVERSION N/A 01/15/2023   Procedure: TRANSESOPHAGEAL ECHOCARDIOGRAM;  Surgeon: Cherrie Toribio SAUNDERS, MD;  Location: [facility] INVASIVE CV LAB;  Service: Cardiovascular;  Laterality: N/A;    Prior to Admission medications   Medication Sig Start Date End Date Taking? Authorizing Provider  albuterol  (VENTOLIN  HFA) 108 (90 Base) MCG/ACT inhaler Inhale 1-2 puffs into the lungs every 6 (six) hours as needed for wheezing or shortness of breath. 08/21/23  Yes Lang Norleen POUR, PA-C  amiodarone  (PACERONE ) 200 MG tablet Take 1 tablet (200 mg total) by mouth daily. 04/02/24  Yes Clegg, Amy D, NP  apixaban  (ELIQUIS ) 5 MG TABS tablet Take 1 tablet (5 mg total) by mouth 2 (two) times daily. 03/19/24  Yes Bensimhon, Toribio SAUNDERS, MD  furosemide  (LASIX ) 40 MG tablet Take 1 tablet (40 mg total) by mouth every other day. Patient taking differently: Take 40 mg by mouth daily as needed for edema (if wt goes over 5 pounds). 02/06/24  Yes Clegg, Amy D, NP  metFORMIN  (GLUCOPHAGE ) 500 MG tablet Take 1 tablet (500 mg total) by mouth 2 (two) times daily with a meal. Patient taking differently: Take 500 mg by mouth daily with breakfast. 02/26/24  Yes Fleming, Zelda W, NP  naproxen sodium (ALEVE) 220 MG tablet Take 220 mg by mouth daily as needed (pain).   Yes [provider]  sacubitril -valsartan  (ENTRESTO ) 24-26 MG Take 1 tablet by mouth 2 (two) times daily. 12/20/23  Yes Milford, Harlene HERO, FNP  spironolactone  (ALDACTONE ) 25 MG tablet Take 0.5 tablets (12.5 mg total) by mouth daily. 01/09/24  Yes Lee, [place], NP  metoprolol  succinate (TOPROL  XL) 50 MG 24 hr tablet Take 1 tablet (50 mg total) by mouth daily. Take with or immediately following a meal. Patient not taking: Reported on 04/30/2024 11/22/23   Lesia Ozell Barter, PA-C  omeprazole  (PRILOSEC) 20 MG capsule Take 1 capsule (20 mg total) by mouth daily. FOR  ACID REFLUX Patient not taking: Reported on 04/30/2024 12/28/22   Theotis Haze ORN, NP    Current Facility-Administered Medications  Medication Dose Route Frequency Provider Last Rate Last Admin   acetaminophen  (TYLENOL ) tablet 650 mg  650 mg Oral Q6H PRN Lupie Credit, DO       Or   acetaminophen  (TYLENOL ) suppository 650 mg  650 mg Rectal Q6H PRN Majeed, Credit, DO       albuterol  (PROVENTIL ) (2.5 MG/3ML) 0.083% nebulizer solution 2.5 mg  2.5 mg Inhalation Q6H PRN Majeed, Credit, DO       amiodarone  (PACERONE ) tablet 200 mg  200 mg Oral Daily Majeed, Credit, DO       metoprolol  tartrate (LOPRESSOR ) tablet 25 mg  25 mg Oral BID Majeed, Credit, DO       sacubitril -valsartan  (ENTRESTO ) 24-26 mg per tablet  1 tablet Oral BID Majeed, Credit, DO       spironolactone  (ALDACTONE ) tablet 12.5 mg  12.5 mg Oral Daily Lupie Credit, DO       Current Outpatient Medications  Medication Sig Dispense Refill   albuterol  (VENTOLIN  HFA) 108 (90 Base) MCG/ACT inhaler  Inhale 1-2 puffs into the lungs every 6 (six) hours as needed for wheezing or shortness of breath. 18 g 2   amiodarone  (PACERONE ) 200 MG tablet Take 1 tablet (200 mg total) by mouth daily. 30 tablet 5   apixaban  (ELIQUIS ) 5 MG TABS tablet Take 1 tablet (5 mg total) by mouth 2 (two) times daily. 60 tablet 11   furosemide  (LASIX ) 40 MG tablet Take 1 tablet (40 mg total) by mouth every other day. (Patient taking differently: Take 40 mg by mouth daily as needed for edema (if wt goes over 5 pounds).)     metFORMIN  (GLUCOPHAGE ) 500 MG tablet Take 1 tablet (500 mg total) by mouth 2 (two) times daily with a meal. (Patient taking differently: Take 500 mg by mouth daily with breakfast.) 60 tablet 4   naproxen sodium (ALEVE) 220 MG tablet Take 220 mg by mouth daily as needed (pain).     sacubitril -valsartan  (ENTRESTO ) 24-26 MG Take 1 tablet by mouth 2 (two) times daily. 60 tablet 11   spironolactone  (ALDACTONE ) 25 MG tablet Take 0.5 tablets (12.5 mg total) by mouth daily.  45 tablet 3   metoprolol  succinate (TOPROL  XL) 50 MG 24 hr tablet Take 1 tablet (50 mg total) by mouth daily. Take with or immediately following a meal. (Patient not taking: Reported on 04/30/2024) 90 tablet 1   omeprazole  (PRILOSEC) 20 MG capsule Take 1 capsule (20 mg total) by mouth daily. FOR ACID REFLUX (Patient not taking: Reported on 04/30/2024) 90 capsule 1    Allergies as of 04/30/2024   (No Known Allergies)    History reviewed. No pertinent family history.  Social History   Socioeconomic History   Marital status: Widowed    Spouse name: Not on file   Number of children: 1   Years of education: Not on file   Highest education level: 12th grade  Occupational History   Occupation: [employer]  Tobacco Use   Smoking status: Never   Smokeless tobacco: Never   Tobacco comments:    Never smoke 10/08/22  Vaping Use   Vaping status: Never Used  Substance and Sexual Activity   Alcohol use: Never   Drug use: Never   Sexual activity: Not Currently  Other Topics Concern   Not on file  Social History Narrative   Not on file   Social Drivers of Health   Financial Resource Strain: High Risk (11/25/2023)   Overall Financial Resource Strain (CARDIA)    Difficulty of Paying Living Expenses: Hard  Food Insecurity: No Food Insecurity (11/25/2023)   Hunger Vital Sign    Worried About Running Out of Food in the Last Year: Never true    Ran Out of Food in the Last Year: Never true  Transportation Needs: Unmet Transportation Needs (01/07/2024)   PRAPARE - Transportation    Lack of Transportation (Medical): Yes    Lack of Transportation (Non-Medical): Yes  Physical Activity: Inactive (11/25/2023)   Exercise Vital Sign    Days of Exercise per Week: 0 days    Minutes of Exercise per Session: 0 min  Stress: Stress Concern Present (11/25/2023)   [employer] of Occupational Health - Occupational Stress Questionnaire    Feeling of Stress : Very much  Social Connections: Socially  Isolated (11/25/2023)   Social Connection and Isolation Panel    Frequency of Communication with Friends and Family: Once a week    Frequency of Social Gatherings with Friends and Family: Never    Attends Religious Services:  Never    Active Member of Clubs or Organizations: No    Attends Banker Meetings: Not on file    Marital Status: Widowed  Intimate Partner Violence: Not At Risk (12/04/2022)   Humiliation, Afraid, Rape, and Kick questionnaire    Fear of Current or Ex-Partner: No    Emotionally Abused: No    Physically Abused: No    Sexually Abused: No    Review of Systems: Pertinent positive and negative review of systems were noted in the above HPI section.  All other review of systems was otherwise negative.   Physical Exam: Vital signs in last 24 hours: Temp:  [97.9 F (36.6 C)-98.3 F (36.8 C)] 98.3 F (36.8 C) (08/14 1330) Pulse Rate:  [91-113] 97 (08/14 1530) Resp:  [14-21] 18 (08/14 1530) BP: (126-155)/(72-94) 150/94 (08/14 1530) SpO2:  [97 %-100 %] 100 % (08/14 1530) Weight:  [99.8 kg] 99.8 kg (08/14 1002)   General:   Alert,  Well-developed, well-nourished, older African-American male pleasant and cooperative in NAD, increased work of breathing at rest Head:  Normocephalic and atraumatic. Eyes:  Sclera clear, no icterus.   Conjunctiva pale Ears:  Normal auditory acuity. Nose:  No deformity, discharge,  or lesions. Mouth:  No deformity or lesions.   Neck:  Supple; no masses or thyromegaly. Lungs:  Clear throughout to auscultation.   No wheezes, crackles, or rhonchi.  Heart:  Regular rate and rhythm; no murmurs, clicks, rubs,  or gallops. Abdomen:  Soft, large, nontender, BS active,nonpalp mass or hsm.   Rectal: Done, documented Hemoccult negative today Msk:  Symmetrical without gross deformities. . Pulses:  Normal pulses noted. Extremities:  Without clubbing or edema. Neurologic:  Alert and  oriented x4;  grossly normal neurologically. Skin:   Intact without significant lesions or rashes.. Psych:  Alert and cooperative. Normal mood and affect.  Intake/Output from previous day: No intake/output data recorded. Intake/Output this shift: No intake/output data recorded.  Lab Results: Recent Labs    04/30/24 1018  WBC 9.6  HGB 6.5*  HCT 26.1*  PLT 227   BMET Recent Labs    04/30/24 1018  NA 141  K 3.8  CL 109  CO2 22  GLUCOSE 145*  BUN 11  CREATININE 0.93  CALCIUM  9.1   LFT No results for input(s): PROT, ALBUMIN, AST, ALT, ALKPHOS, BILITOT, BILIDIR, IBILI in the last 72 hours. PT/INR No results for input(s): LABPROT, INR in the last 72 hours. Hepatitis Panel No results for input(s): HEPBSAG, HCVAB, HEPAIGM, HEPBIGM in the last 72 hours.    IMPRESSION:  #37 63 year old African-American male with profound microcytic anemia, iron  deficient, currently Hemoccult negative in setting of chronic Eliquis   Symptomatic with progressive fatigue and dyspnea.   Etiology of his iron  deficiency anemia is not clear, would suspect that he may have been having intermittent GI blood loss over multiple months, though currently heme-negative. Need to rule out GI etiologies for the iron  deficiency anemia as initial workup.    Patient does have a very large paraesophageal hernia noted on recent CT with intrathoracic stomach, the hernia is also containing a portion of the pancreas.  He certainly may be having intermittent chronic GI blood loss secondary to erosions of the large paraesophageal hernia, will also need to rule out other GI etiologies i.e. chronic gastropathy, malignancy, AVMs  #2 chronic anticoagulation-on Eliquis  last dose last p.m. #3 atrial fibrillation #4 congestive heart failure/on Entresto -most recent EF 65% July 2025 #5 diabetes mellitus #6 previous hernia repair #  7 hypertension  PLAN: Heart healthy diet tonight, clear liquid diet in a.m. Hold  Eliquis  Serial hemoglobins and  transfuse to keep his hemoglobin at least 7.5 due to his significant symptomatic state.  Currently getting 1 unit, may need another unit. He will benefit from IV iron  infusion during this admission  Will plan for EGD and colonoscopy with Dr. Charlanne after Eliquis  washout.  Will plan for bowel prep tomorrow afternoon evening and procedures on Saturday, 05/02/2024 in AM. Procedures were discussed in detail with the patient including indications risks and benefits and he is agreeable to proceed.[facility]  GI will follow with you.    Amy Esterwood PA-C 04/30/2024, 3:52 PM    Attending physician's note   I have taken history, reviewed the chart and examined the patient. I performed a substantive portion of this encounter, including complete performance of at least one of the key components, in conjunction with the APP. I agree with the Advanced Practitioner's note, impression and recommendations.   Symptomatic profound IDA with heme neg stools. Hb 6.5 (baseline 13, June 2024). Large paraesophageal hernia containing almost entire stomach and pancreas on CT this adm. No obvious masses. No NSAIDs.  A-fib on Eliquis  (last dose 8/13).  Assoc comorbidities include HFpEF, DM, HTN, obesity,   Plan: -IV Protonix . -Agree with transfusion. -Trend CBC. Keep Hb>7 -EGD/colon 8/16 after Eliquis  washout.  I have discussed risks and benefits with the patient and patient's son   Anselm Charlanne, MD [facility] [phone]

## 2024-04-30 NOTE — ED Notes (Signed)
 CCMD Notified

## 2024-04-30 NOTE — ED Triage Notes (Signed)
 Pt sent in by [facility] for exertional SOB x 2 months.  Does not endorse CP or swelling.  Feels more weak and tired when doing basic tasks.  Does not endorse any bleeding.

## 2024-04-30 NOTE — Plan of Care (Signed)

## 2024-04-30 NOTE — Assessment & Plan Note (Addendum)
 HFmrEF: Continue entresto  and spirinolactone. Euvolemic and recent echo was stable. EF 65%-70% with last echo in June. HLD: Continue atorvastatin  questionable HTN: Start lopressor  DM: Stop metformin  and monitor CBG

## 2024-04-30 NOTE — Assessment & Plan Note (Addendum)
-  Admit to FMTS, med-tele, attending Dr, Madelon -GI consulted, appreciate recs -IV protonix  -NPO pending GI eval -Transfuse 1 PRBC- follow up post H&H -Transfusion treshhold <7 -Hold Eliquis  -Obtain UA microscopy to rule out urinary source

## 2024-04-30 NOTE — H&P (Addendum)
 Hospital Admission History and Physical Service Pager: [phone]  Patient name: Jonathan Gonzalez Medical record number: [id number] Date of Birth: 1961-02-11 Age: 63 y.o. Gender: male  Primary Care Provider: General: Haze Servant, NP. Cardiology Dr. Oneil Parchment. Electrophysiology Dr. Cindie Smalls. Consultants: GI Code Status: DNR-prearrest which was confirmed with patient Preferred Emergency Contact: son, Keri, Tavella [phone]  Chief Complaint:  SOB and fatigue  Differential and Medical Decision Making:  Jonathan Gonzalez is a 63 y.o. male presenting with symptomatic anemia with SOB and fatigue.  Iron  studies showing significant iron  deficiency anemia, suspect 2/2 upper GI bleed.  History of hematuria, will obtain urine studies-consider cystoscopy if hematuria present. Low concern for thalassemia given acuity and previous normal hemoglobins, other cell lines normal making leukemia/myelodysplastic process less likely. Assessment & Plan Symptomatic anemia -Admit to FMTS, med-tele, attending Dr, Rumball -GI consulted, appreciate recs -IV protonix  -NPO pending GI eval -Transfuse 1 PRBC- follow up post H&H -Transfusion treshhold <7 -Hold Eliquis  -Obtain UA microscopy to rule out urinary source Atrial fibrillation Patient NSR. 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 Euvolemic and recent echo was stable. EF 65%-70% with last echo in June. HLD: Continue atorvastatin  questionable HTN: Start lopressor  DM: Stop metformin  and monitor CBG  FEN/GI: NPO VTE Prophylaxis: SCD  Disposition: admit to MedTele  History of Present Illness:  Jonathan Gonzalez is a 63 y.o. male presenting with a past medical history of HFmREF, A-Fib on eliquis  and amiodarone , HTN, and DM  presents with a chief complaint of SOB and fatigue x2 months sent in by [facility] with concerns for recent Hgb of 7 in June. Patient reports that his symptoms are worsening and affecting his ADL, causing him to rest more frequently which results in improvement. He says that he noticed blood in his urine recently and had a history of UTI in April. He admits to having CP intermittently, last occurring Sunday, midsternally and described as heaviness without radiation or diaphoresis. Also admits to near syncopal episode x1. Admits to also having intermittent abdominal pain when sugars are too high. Last bowel movement was this morning. No recent changes to medications. Denies colonoscopies/EGD. Denies history of personal or family history of colon cancer. Denies recent palpitations, dizziness, dyspnea, pleuritic CP, syncope, hematochezia, melena, hemoptysis, recent NSAID use, ecchymosis, jaundice, bleeding gums, epistaxis, recent infections, falls, trauma, and any other associated symptoms.  In the ED, patient was found to be anemic with a Hgb of 6.5 with MCV 60.7. Patient is currently being transfused with 1 unit of PRBC. Other labs were unremarkable. Iron  studies showed iron  low, TIBC elevated, ferritin low  Review Of Systems:  Per HPI  Pertinent Past Medical History: HTN  HLD GERD DM Asthma A-fib GERD  HFmREF Remainder reviewed in history tab.   Pertinent Past Surgical History: Cardioversion 3/24, 4/24, 6/24 Hernia repair TEE  Remainder reviewed in history tab.   Pertinent Social History: Tobacco use: no Alcohol use: no Other Substance use: no Lives with son who is his emergency contact  Pertinent Family History: None  Important Outpatient Medications: Albuterol  PRN Amiodarone  Eliquis  Aspirin-salycilamide-caffeine (BC powder) Lasix  PRN Metformin  Toprol  (he says PRN) Omeprazole  Sacubatril-valsartan  Spirinolactone Atorvastatin   Objective: BP (!) 151/80   Pulse 95   Temp  98.3 F (36.8 C) (Oral)   Resp 17   Ht 5' 8 (1.727 m)  Wt 99.8 kg   SpO2 100%   BMI 33.45 kg/m  Exam: General: Laying on bed in no acute distress Eyes: Non icteric, EOMI ENTM: Atraumatic, moist mucosa, no epistaxis, no gingivitis Neck: Supple Cardiovascular: RRR no murmurs Respiratory: CTAB, breathing comfortably on room air Gastrointestinal: No TTP, soft, non-distended MSK: No deformities Derm: No ecchymosis, no rashes, no lacerations, no petechiae Neuro: AxOx4, strength 5/5, sensation intact Psych: Appropriate mood and affect  Labs:  CBC BMET  Recent Labs  Lab 04/30/24 1018  WBC 9.6  HGB 6.5*  HCT 26.1*  PLT 227   Recent Labs  Lab 04/30/24 1018  NA 141  K 3.8  CL 109  CO2 22  BUN 11  CREATININE 0.93  GLUCOSE 145*  CALCIUM  9.1    Pertinent additional labs     EKG: My own interpretation (not copied from electronic read) sinus tachycardia   Iron  15 TIBC 498 Ferritin 3  MCV 60.7 RDW 23.5  Imaging Studies Performed: Chest x-ray IMPRESSION: 1. Stable chest, no acute process.  Gwenn Bernida MARLA Wilburt, Medical Student 04/30/2024, 1:35 PM AI, [facility]  FPTS Intern pager: [phone], text pages welcome Secure chat group [facility] Teaching Service   I was personally present and performed or re-performed the history, physical exam and medical decision making activities of this service and have verified that the service and findings are accurately documented in the student's note.  Gladis Church, DO                  04/30/2024, 4:00 PM

## 2024-04-30 NOTE — ED Provider Notes (Signed)
 [place] EMERGENCY DEPARTMENT AT [facility] Provider Note   CSN: [id number] Arrival date & time: 04/30/24  9062     Patient presents with: Shortness of Breath   Jonathan Gonzalez is a 63 y.o. male.   63 year old male with history of asthma, atrial fibrillation on Eliquis  and amiodarone , HFrEF, hypertension, hyperlipidemia, type 2 diabetes who presents emergency department shortness of breath.  Patient reports that since July has had ongoing shortness of breath.  Says that he thinks he may have gained 4 pounds in that time.  Says that he was seen and told he was anemic and needed to be admitted but he opted to leave to go home to take care of something with his son.  Since then reports he has been getting more short of breath even with small tasks.  No significant lower extremity edema.  Very mild cough.  No fevers or chills.  Has not noticed any melena or hematochezia.  Does have a remote history of an ulcer and thinks that he had an EGD approximately 30 years ago.       Prior to Admission medications   Medication Sig Start Date End Date Taking? Authorizing Provider  albuterol  (VENTOLIN  HFA) 108 (90 Base) MCG/ACT inhaler Inhale 1-2 puffs into the lungs every 6 (six) hours as needed for wheezing or shortness of breath. 08/21/23  Yes Robinson, John K, PA-C  amiodarone  (PACERONE ) 200 MG tablet Take 1 tablet (200 mg total) by mouth daily. 04/02/24  Yes Clegg, Amy D, NP  apixaban  (ELIQUIS ) 5 MG TABS tablet Take 1 tablet (5 mg total) by mouth 2 (two) times daily. 03/19/24  Yes Bensimhon, Toribio SAUNDERS, MD  furosemide  (LASIX ) 40 MG tablet Take 1 tablet (40 mg total) by mouth every other day. Patient taking differently: Take 40 mg by mouth daily as needed for edema (if wt goes over 5 pounds). 02/06/24  Yes Clegg, Amy D, NP  metFORMIN  (GLUCOPHAGE ) 500 MG tablet Take 1 tablet (500 mg total) by mouth 2 (two) times daily with a meal. Patient taking differently: Take 500 mg by mouth daily with  breakfast. 02/26/24  Yes Fleming, Zelda W, NP  naproxen sodium (ALEVE) 220 MG tablet Take 220 mg by mouth daily as needed (pain).   Yes [provider]  sacubitril -valsartan  (ENTRESTO ) 24-26 MG Take 1 tablet by mouth 2 (two) times daily. 12/20/23  Yes Milford, Harlene HERO, FNP  spironolactone  (ALDACTONE ) 25 MG tablet Take 0.5 tablets (12.5 mg total) by mouth daily. 01/09/24  Yes Lee, [place], NP  metoprolol  succinate (TOPROL  XL) 50 MG 24 hr tablet Take 1 tablet (50 mg total) by mouth daily. Take with or immediately following a meal. Patient not taking: Reported on 04/30/2024 11/22/23   Lesia Ozell Barter, PA-C  omeprazole  (PRILOSEC) 20 MG capsule Take 1 capsule (20 mg total) by mouth daily. FOR ACID REFLUX Patient not taking: Reported on 04/30/2024 12/28/22   Theotis Haze ORN, NP    Allergies: Patient has no known allergies.    Review of Systems  Updated Vital Signs BP (!) 132/92   Pulse 94   Temp 98.3 F (36.8 C) (Oral)   Resp 19   Ht 5' 8 (1.727 m)   Wt 99.8 kg   SpO2 100%   BMI 33.45 kg/m   Physical Exam Vitals and nursing note reviewed.  Constitutional:      General: He is not in acute distress.    Appearance: He is well-developed.  HENT:  Head: Normocephalic and atraumatic.     Right Ear: External ear normal.     Left Ear: External ear normal.     Nose: Nose normal.  Eyes:     Extraocular Movements: Extraocular movements intact.     Conjunctiva/sclera: Conjunctivae normal.     Pupils: Pupils are equal, round, and reactive to light.  Cardiovascular:     Rate and Rhythm: Regular rhythm. Tachycardia present.     Heart sounds: Normal heart sounds.  Pulmonary:     Effort: Pulmonary effort is normal. No respiratory distress.     Breath sounds: Wheezing (Faint, mild, expiratory.) present.  Abdominal:     General: There is no distension.     Palpations: Abdomen is soft. There is no mass.     Tenderness: There is no abdominal tenderness. There is no guarding.   Musculoskeletal:     Cervical back: Normal range of motion and neck supple.     Right lower leg: No edema.     Left lower leg: No edema.  Skin:    General: Skin is warm and dry.  Neurological:     Mental Status: He is alert. Mental status is at baseline.  Psychiatric:        Mood and Affect: Mood normal.        Behavior: Behavior normal.     (all labs ordered are listed, but only abnormal results are displayed) Labs Reviewed  BASIC METABOLIC PANEL WITH GFR - Abnormal; Notable for the following components:      Result Value   Glucose, Bld 145 (*)    All other components within normal limits  CBC - Abnormal; Notable for the following components:   Hemoglobin 6.5 (*)    HCT 26.1 (*)    MCV 60.7 (*)    MCH 15.1 (*)    MCHC 24.9 (*)    RDW 23.5 (*)    All other components within normal limits  IRON  AND TIBC - Abnormal; Notable for the following components:   Iron  15 (*)    TIBC 498 (*)    Saturation Ratios 3 (*)    All other components within normal limits  FERRITIN - Abnormal; Notable for the following components:   Ferritin 3 (*)    All other components within normal limits  BRAIN NATRIURETIC PEPTIDE  HIV ANTIBODY (ROUTINE TESTING W REFLEX)  POC OCCULT BLOOD, ED  TYPE AND SCREEN  ABO/RH  PREPARE RBC (CROSSMATCH)  TROPONIN I (HIGH SENSITIVITY)  TROPONIN I (HIGH SENSITIVITY)    EKG: EKG Interpretation Date/Time:  Thursday April 30 2024 09:55:07 EDT Ventricular Rate:  111 PR Interval:  144 QRS Duration:  68 QT Interval:  344 QTC Calculation: 467 R Axis:   30  Text Interpretation: Sinus tachycardia Otherwise normal ECG When compared with ECG of 02-Apr-2024 14:27, PREVIOUS ECG IS PRESENT Confirmed by Yolande Charleston [phone]) on 04/30/2024 12:06:04 PM  Radiology: [facility] Chest 2 View Result Date: 04/30/2024 CLINICAL DATA:  Shortness of breath for 2 months EXAM: CHEST - 2 VIEW COMPARISON:  03/12/2024 FINDINGS: Frontal and lateral views of the chest demonstrate a stable  cardiac silhouette. 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 No acute airspace disease, effusion, or pneumothorax. There are no acute bony abnormalities. IMPRESSION: 1. Stable chest, no acute process. Electronically Signed   By: Ozell Daring M.D.   On: 04/30/2024 10:39     Procedures   Medications Ordered  in the ED  amiodarone  (PACERONE ) tablet 200 mg (has no administration in time range)  sacubitril -valsartan  (ENTRESTO ) 24-26 mg per tablet (has no administration in time range)  spironolactone  (ALDACTONE ) tablet 12.5 mg (has no administration in time range)  albuterol  (PROVENTIL ) (2.5 MG/3ML) 0.083% nebulizer solution 2.5 mg (has no administration in time range)  acetaminophen  (TYLENOL ) tablet 650 mg (has no administration in time range)    Or  acetaminophen  (TYLENOL ) suppository 650 mg (has no administration in time range)  metoprolol  tartrate (LOPRESSOR ) tablet 25 mg (has no administration in time range)  0.9 %  sodium chloride  infusion (Manually program via Guardrails IV Fluids) ( Intravenous New Bag/Given 04/30/24 1316)  ipratropium-albuterol  (DUONEB) 0.5-2.5 (3) MG/3ML nebulizer solution 3 mL (3 mLs Nebulization Given 04/30/24 1204)    Clinical Course as of 04/30/24 1522  Thu Apr 30, 2024  1236 Amy Esterwood from [facility] consulted.  They will see the patient when admitted [RP]  1248 Dr Lupie from [facility] teaching service.  [RP]    Clinical Course User Index [RP] Yolande Lamar BROCKS, MD                                 Medical Decision Making Amount and/or Complexity of Data Reviewed Labs: ordered. Radiology: ordered.  Risk Prescription drug management. Decision regarding hospitalization.   Jonathan Gonzalez is a 63 y.o. male with comorbidities that complicate the patient evaluation including asthma, atrial fibrillation on Eliquis  and amiodarone , HFrEF, hypertension,  hyperlipidemia, diabetes who presents emergency department shortness of breath  Initial Ddx:  Asthma exacerbation, CHF, amiodarone  induced lung injury, anemia, MI, PE  MDM/Course:  Patient presents emergency department shortness of breath.  Been going on for several months.  Was noted to be anemic previously.  Not having any melena or hematochezia that he is aware of but is on blood thinners but is at high risk for this.  On exam does have some faint expiratory wheezing but with degree of his shortness of breath and exercise intolerance is out of proportion to the wheezing and hearing.  Was given a trial of a nebulizer to see if it improves his symptoms.  His labs came back and he was anemic to 6.5.  Hemoccult was negative.  Suspect that this is likely the cause of his symptoms so was transfused blood and admitted to the hospital for further management.  GI consulted as well.  Of note, did consider PE but feel it is less likely given the fact that he is already anticoagulated and is not having a chest discomfort.  This patient presents to the ED for concern of complaints listed in HPI, this involves an extensive number of treatment options, and is a complaint that carries with it a high risk of complications and morbidity. Disposition including potential need for admission considered.   Dispo: Admit to Floor  Additional history obtained from son Records reviewed Outpatient Clinic Notes The following labs were independently interpreted: CBC and show acute anemia I independently reviewed the following imaging with scope of interpretation limited to determining acute life threatening conditions related to emergency care: Chest x-ray and agree with the radiologist interpretation with the following exceptions: none I personally reviewed and interpreted cardiac monitoring: normal sinus rhythm  I personally reviewed and interpreted the pt's EKG: see above for interpretation  I have reviewed the patients  home medications and made adjustments as needed Consults: Gastroenterology and Hospitalist  Portions  of this note were generated with Scientist, clinical (histocompatibility and immunogenetics). Dictation errors may occur despite best attempts at proofreading.    CRITICAL CARE Performed by: Lamar JAYSON Shan   Total critical care time: 30 minutes  Critical care time was exclusive of separately billable procedures and treating other patients.  Critical care was necessary to treat or prevent imminent or life-threatening deterioration.  Critical care was time spent personally by me on the following activities: development of treatment plan with patient and/or surrogate as well as nursing, discussions with consultants, evaluation of patient's response to treatment, examination of patient, obtaining history from patient or surrogate, ordering and performing treatments and interventions, ordering and review of laboratory studies, ordering and review of radiographic studies, pulse oximetry and re-evaluation of patient's condition.   Final diagnoses:  Symptomatic anemia  Wheezing    ED Discharge Orders     None          Shan Lamar JAYSON, MD 04/30/24 [phone]

## 2024-04-30 NOTE — Assessment & Plan Note (Addendum)
 Patient NSR. Questionable toprolol adherence, will transition into succinate during admission for appropriate titration -Continue amiodarone  200mg  daily - switching troprolol to lopressor  25mg  BID

## 2024-04-30 NOTE — Patient Instructions (Signed)
 Medication Instructions:  Your physician recommends that you continue on your current medications as directed. Please refer to the Current Medication list given to you today.  *If you need a refill on your cardiac medications before your next appointment, please call your pharmacy*  Follow-Up: At [facility], you and your health needs are our priority.  As part of our continuing mission to provide you with exceptional heart care, our providers are all part of one team.  This team includes your primary Cardiologist (physician) and Advanced Practice Providers or APPs (Physician Assistants and Nurse Practitioners) who all work together to provide you with the care you need, when you need it.  Please head straight to [facility] ER

## 2024-04-30 NOTE — Plan of Care (Addendum)
 FMTS Interim Progress Note  S: Night rounds for new day team admission. Patient resting comfortably in bed, reporting increased sputum production and cough. Denies CP, N/V/D.   O: BP 120/66 (BP Location: Right Arm)   Pulse 83   Temp 98.1 F (36.7 C) (Oral)   Resp 18   Ht 5' 8 (1.727 m)   Wt 99.8 kg   SpO2 98%   BMI 33.45 kg/m   General: Chronically ill-appearing, no acute distress Cardio: RRR, no murmur on exam Pulm: clear, no wheezing appreciated, No increased work of breathing Abdomen: Soft, bowel sounds present, nontender Extremity: No peripheral edema   A/P: Symptomatic Anemia most likely 2/2 to GI losses  S/P 1 unit, awaiting post-transfusion H&H. Clinically stable.  - goal Hgb >7.5 - trend CBC daily  - planning scope 8/16  Increased WOB without Respiratory Distress:  No documented history of asthma or COPD.  CXR negative for pneumonia. Troponin and EKG benign.  - trial DuoNeb  - consider sleep study and repeat PFT outpatient  - check pulse ox with vital signs   Cleotilde Perkins, DO 04/30/2024, 8:45 PM PGY-3, [facility] Family Medicine Service pager [phone]

## 2024-04-30 NOTE — Hospital Course (Addendum)
 63 year old male with a past medical history of past medical history of HFmREF, A-Fib on eliquis  and amiodarone , HTN, and DM who was admitted to the FMTS at [facility] with SOB and fatigue x2 months.    Symptomatic anemia Patient with history of declining Hgb since December 2024. 12.7>6.5 today.  Significant iron  deficiency, ferritin:3 and serum iron  15.  Presented to the ED June 2025 with suspected GI bleed but left AMA prior to workup. Reported hematuria and UA showed no hematuria. Patient was transfused with a transfusion goal of 7.5 given his profound symptoms.  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 GI was consulted who recommended EGD/colonoscopy and IV iron  infusion. EGD colonoscopy showed no active bleed, biopsies taken and polyp from colon removed. At time of discharge Hgb was 9.6 and stable. Symptoms resolved. Blood thinners were resumed at discharge see below.  Recommend outpatient referral to hematology.  AFIB Patient presented in NSR. Held eliquis  for anemia as above. Continued amiodarone  and switched toprolol to lopressor  to titrate metoprolol  dosing. At time of discharge patient remained NSR and sent home with the following regimen: Toprol  50 mg daily and amiodarone  200 mg daily.  Since no active GI bleed was discovered, shared decision making was used regarding continuation of Eliquis .  After discussion of risk and benefits, patient is opted to continue Eliquis  at this time.  Instructed to monitor for signs and symptoms of bleeding.  DME needs Patient provided with rollator per PT recommendations.  Other chronic conditions were medically managed with home medications and formulary alternatives as necessary (HTN,HLD. HFmrEF)  Follow-up recommendations Confirm patients GDMT regimen Consider maintenance inhaler for  asthma if wheezing Recommend IV iron  infusion outpatient Recommend follow-up with GI outpatient Consider further investigation of iron -deficiency anemia, such as celiac testing and consideration of hematology referral

## 2024-04-30 NOTE — Progress Notes (Signed)
 Patient stated that he was having a hard time catching his breath after a coughing spell, RT give patient a PRN duoneb treatment and placed patient on 3L humidified oxygen. RN informed.

## 2024-04-30 NOTE — ED Notes (Signed)
 CCMD called, pt on monitor

## 2024-05-01 DIAGNOSIS — D649 Anemia, unspecified: Secondary | ICD-10-CM | POA: Diagnosis not present

## 2024-05-01 DIAGNOSIS — R195 Other fecal abnormalities: Secondary | ICD-10-CM | POA: Diagnosis not present

## 2024-05-01 DIAGNOSIS — D509 Iron deficiency anemia, unspecified: Secondary | ICD-10-CM | POA: Diagnosis not present

## 2024-05-01 DIAGNOSIS — K449 Diaphragmatic hernia without obstruction or gangrene: Secondary | ICD-10-CM

## 2024-05-01 DIAGNOSIS — I4891 Unspecified atrial fibrillation: Secondary | ICD-10-CM

## 2024-05-01 DIAGNOSIS — J45901 Unspecified asthma with (acute) exacerbation: Secondary | ICD-10-CM | POA: Insufficient documentation

## 2024-05-01 DIAGNOSIS — Z7901 Long term (current) use of anticoagulants: Secondary | ICD-10-CM

## 2024-05-01 LAB — COMPREHENSIVE METABOLIC PANEL WITH GFR
ALT: 17 U/L (ref 0–44)
AST: 21 U/L (ref 15–41)
Albumin: 3.2 g/dL — ABNORMAL LOW (ref 3.5–5.0)
Alkaline Phosphatase: 62 U/L (ref 38–126)
Anion gap: 9 (ref 5–15)
BUN: 11 mg/dL (ref 8–23)
CO2: 22 mmol/L (ref 22–32)
Calcium: 8.9 mg/dL (ref 8.9–10.3)
Chloride: 106 mmol/L (ref 98–111)
Creatinine, Ser: 1.01 mg/dL (ref 0.61–1.24)
GFR, Estimated: >60 mL/min (ref >60)
Glucose, Bld: 105 mg/dL — ABNORMAL HIGH (ref 70–99)
Potassium: 3.8 mmol/L (ref 3.5–5.1)
Sodium: 137 mmol/L (ref 135–145)
Total Bilirubin: 1.1 mg/dL (ref 0.0–1.2)
Total Protein: 6.4 g/dL — ABNORMAL LOW (ref 6.5–8.1)

## 2024-05-01 LAB — CBC
HCT: 27.1 % — ABNORMAL LOW (ref 39.0–52.0)
Hemoglobin: 7.2 g/dL — ABNORMAL LOW (ref 13.0–17.0)
MCH: 16.7 pg — ABNORMAL LOW (ref 26.0–34.0)
MCHC: 26.6 g/dL — ABNORMAL LOW (ref 30.0–36.0)
MCV: 62.7 fL — ABNORMAL LOW (ref 80.0–100.0)
Platelets: 201 K/uL (ref 150–400)
RBC: 4.32 MIL/uL (ref 4.22–5.81)
RDW: 25.8 % — ABNORMAL HIGH (ref 11.5–15.5)
WBC: 10.7 K/uL — ABNORMAL HIGH (ref 4.0–10.5)
nRBC: 0 % (ref 0.0–0.2)

## 2024-05-01 LAB — URINALYSIS, ROUTINE W REFLEX MICROSCOPIC
Bilirubin Urine: NEGATIVE
Glucose, UA: NEGATIVE mg/dL
Hgb urine dipstick: NEGATIVE
Ketones, ur: NEGATIVE mg/dL
Nitrite: POSITIVE — AB
Protein, ur: 30 mg/dL — AB
Specific Gravity, Urine: 1.02 (ref 1.005–1.030)
WBC, UA: >50 WBC/hpf (ref 0–5)
pH: 5 (ref 5.0–8.0)

## 2024-05-01 LAB — HEMOGLOBIN AND HEMATOCRIT, BLOOD
HCT: 35.3 % — ABNORMAL LOW (ref 39.0–52.0)
Hemoglobin: 9.7 g/dL — ABNORMAL LOW (ref 13.0–17.0)

## 2024-05-01 LAB — PREPARE RBC (CROSSMATCH)

## 2024-05-01 MED ORDER — SODIUM CHLORIDE 0.9% IV SOLUTION: Freq: Once | INTRAVENOUS | Status: AC

## 2024-05-01 MED ORDER — PREDNISONE 20 MG PO TABS
50.0000 mg | ORAL_TABLET | Freq: Every day | ORAL | Status: DC
  Administered 2024-05-01 – 2024-05-02 (×2): 50 mg via ORAL
  Filled 2024-05-01 (×2): qty 1

## 2024-05-01 MED ORDER — NA SULFATE-K SULFATE-MG SULF 17.5-3.13-1.6 GM/177ML PO SOLN
0.5000 | Freq: Once | ORAL | Status: AC
  Administered 2024-05-01: 177 mL via ORAL
  Filled 2024-05-01 (×2): qty 1

## 2024-05-01 MED ORDER — SODIUM CHLORIDE 0.9 % IV SOLN: INTRAVENOUS | Status: AC

## 2024-05-01 MED ORDER — NA SULFATE-K SULFATE-MG SULF 17.5-3.13-1.6 GM/177ML PO SOLN
0.5000 | Freq: Once | ORAL | Status: AC
  Administered 2024-05-02: 177 mL via ORAL

## 2024-05-01 MED ORDER — IPRATROPIUM-ALBUTEROL 0.5-2.5 (3) MG/3ML IN SOLN: 3.0000 mL | RESPIRATORY_TRACT | Status: DC | PRN

## 2024-05-01 MED ORDER — SIMETHICONE 80 MG PO CHEW
240.0000 mg | CHEWABLE_TABLET | Freq: Once | ORAL | Status: AC
  Administered 2024-05-02: 240 mg via ORAL
  Filled 2024-05-01: qty 3

## 2024-05-01 MED ORDER — BUDESONIDE 0.25 MG/2ML IN SUSP
0.2500 mg | Freq: Two times a day (BID) | RESPIRATORY_TRACT | Status: DC
  Administered 2024-05-01 – 2024-05-02 (×3): 0.25 mg via RESPIRATORY_TRACT
  Filled 2024-05-01 (×3): qty 2

## 2024-05-01 MED ORDER — IPRATROPIUM-ALBUTEROL 0.5-2.5 (3) MG/3ML IN SOLN: 3.0000 mL | RESPIRATORY_TRACT | Status: DC

## 2024-05-01 MED ORDER — IPRATROPIUM-ALBUTEROL 0.5-2.5 (3) MG/3ML IN SOLN
3.0000 mL | RESPIRATORY_TRACT | Status: DC
  Administered 2024-05-01: 3 mL via RESPIRATORY_TRACT
  Filled 2024-05-01: qty 3

## 2024-05-01 MED ORDER — SIMETHICONE 80 MG PO CHEW
240.0000 mg | CHEWABLE_TABLET | Freq: Once | ORAL | Status: AC
  Administered 2024-05-02: 240 mg via ORAL

## 2024-05-01 NOTE — Assessment & Plan Note (Addendum)
 Patient NSR.  - Due to questionable metoprolol  tartrate adherence at home, will use metoprolol  succinate 25 mg twice daily - Continue amiodarone  200mg  daily - Eliquis  held for possible GI bleed, last dose 08/13

## 2024-05-01 NOTE — Assessment & Plan Note (Addendum)
 Hgb stable. No melena this admit. S/P 2 units RBC.  - GI consulted, appreciate recs - IV protonix  BID - NPO 08/16 for EGD/colonoscopy - Transfusion threshold ,<7 - Trend daily CBC

## 2024-05-01 NOTE — Assessment & Plan Note (Signed)
 HFmrEF: Continue entresto  and spirinolactone. Euvolemic and recent echo was stable. EF 65%-70% with last echo in June. 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

## 2024-05-01 NOTE — Progress Notes (Signed)
 Transition of Care [facility]) - Inpatient Brief Assessment   Patient Details  Name: Jonathan Gonzalez MRN: [id number] Date of Birth: July 09, 1961  Transition of Care [facility]) CM/SW Contact:    Rosaline JONELLE Joe, RN Phone Number: 05/01/2024, 3:52 PM   Clinical Narrative: CM met with the patient at the bedside to discuss TOc needs.  The patient admited with SOB, fatigue and anemia.  Patient has no DME at the home and lives alone at the hotel.    Patient was provided choice regarding DME company and he did not have a preference.  I called [employer] and requested delivery of rolator to the hospital room today.  Patient states that he normally uses [employer] to appointments since he does not drive.  Resources for Medicaid transportation was included in the patient's AVS.  Patient plans to follow up with social security office regarding his current disability check that he receives.  Patient will need taxi voucher home when stable for discharge since he does not have family to assist with transportation.   Transition of Care Asessment: Insurance and Status: (P) Insurance coverage has been reviewed Patient has primary care physician: (P) Yes Home environment has been reviewed: (P) from Extended Stay hotel Prior level of function:: (P) self Prior/Current Home Services: (P) No current home services Social Drivers of Health Review: (P) SDOH reviewed interventions complete Readmission risk has been reviewed: (P) Yes Transition of care needs: (P) transition of care needs identified, TOC will continue to follow

## 2024-05-01 NOTE — Assessment & Plan Note (Addendum)
 Increased work of breathing on room air with diffuse expiratory wheezing.  Had 1 DuoNeb overnight with 3 L nasal cannula for comfort.  He was weaned off 3 L nasal cannula within 2 hours because it was not very helpful. -Scheduled DuoNebs every 4 hours -Prednisone  course 50 mg p.o. 5 days (8/15 - 8/20)

## 2024-05-01 NOTE — Assessment & Plan Note (Addendum)
 Improving.  - Scheduled DuoNebs every 4 hours - Prednisone  course 50 mg p.o. 5 days (8/15 - 8/20) - Pulmicort  neb BID, discharge with ICS inhaler

## 2024-05-01 NOTE — Progress Notes (Signed)
     Progress Note    ASSESSMENT AND PLAN:   IDA  with H+ stools. 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    I discussed EGD/Colonoscopy- the indications, risks, alternatives and potential complications including, but not limited to bleeding, infection, reaction to meds, damage to internal organs, cardiac and/or pulmonary problems, and perforation requiring surgery. The possibility that significant findings could be missed was explained. All ? were answered. Pt consents to proceed.   SUBJECTIVE   Doing better after transfusion No nausea or vomiting. No melena or hematochezia.    OBJECTIVE:     Vital signs in last 24 hours: Temp:  [98.1 F (36.7 C)-98.6 F (37 C)] 98.1 F (36.7 C) (08/15 1248) Pulse Rate:  [64-97] 64 (08/15 1248) Resp:  [13-20] 19 (08/15 0610) BP: (108-152)/(66-94) 136/81 (08/15 1248) SpO2:  [96 %-100 %] 100 % (08/15 1248) Last BM Date : 04/29/24 General:   Alert, well-developed, in NAD EENT:  Normal hearing, non icteric sclera, conjunctive pink.  Heart:  Regular rate and rhythm; no murmur.  No lower extremity edema   Pulm: Normal respiratory effort, lungs CTA bilaterally without wheezes or crackles. Abdomen:  Soft, nondistended, nontender.  Normal bowel sounds,.       Neurologic:  Alert and  oriented x4;  grossly normal neurologically. Psych:  Pleasant, cooperative.  Normal mood and affect.   Intake/Output from previous day: 08/14 0701 - 08/15 0700 In: 436 [P.O.:120; Blood:316] Out: -  Intake/Output this shift: Total I/O In: 480 [Blood:480] Out: -   Lab Results: Recent Labs    04/30/24 1018 05/01/24 0328 05/01/24 1000  WBC 9.6 10.7*  --   HGB 6.5* 7.2* 9.7*  HCT 26.1* 27.1* 35.3*  PLT 227 201  --    BMET Recent Labs    04/30/24 1018 05/01/24 0328  NA 141 137  K 3.8 3.8  CL 109 106  CO2 22 22  GLUCOSE 145*  105*  BUN 11 11  CREATININE 0.93 1.01  CALCIUM  9.1 8.9   LFT Recent Labs    05/01/24 0328  PROT 6.4*  ALBUMIN 3.2*  AST 21  ALT 17  ALKPHOS 62  BILITOT 1.1   PT/INR No results for input(s): LABPROT, INR in the last 72 hours. Hepatitis Panel No results for input(s): HEPBSAG, HCVAB, HEPAIGM, HEPBIGM in the last 72 hours.  DG Chest 2 View Result Date: 04/30/2024 CLINICAL DATA:  Shortness of breath for 2 months EXAM: CHEST - 2 VIEW COMPARISON:  03/12/2024 FINDINGS: Frontal and lateral views of the chest demonstrate a stable cardiac silhouette. 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 No acute airspace disease, effusion, or pneumothorax. There are no acute bony abnormalities. IMPRESSION: 1. Stable chest, no acute process. Electronically Signed   By: Ozell Daring M.D.   On: 04/30/2024 10:39     Active Problems:   Atrial fibrillation   Symptomatic anemia   Chronic health problem   Asthma exacerbation     LOS: 1 day     Anselm Bring, MD 05/01/2024, 2:48 PM [facility] [phone]

## 2024-05-01 NOTE — H&P (View-Only) (Signed)
     Progress Note    ASSESSMENT AND PLAN:   IDA  with H+ stools. 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    I discussed EGD/Colonoscopy- the indications, risks, alternatives and potential complications including, but not limited to bleeding, infection, reaction to meds, damage to internal organs, cardiac and/or pulmonary problems, and perforation requiring surgery. The possibility that significant findings could be missed was explained. All ? were answered. Pt consents to proceed.   SUBJECTIVE   Doing better after transfusion No nausea or vomiting. No melena or hematochezia.    OBJECTIVE:     Vital signs in last 24 hours: Temp:  [98.1 F (36.7 C)-98.6 F (37 C)] 98.1 F (36.7 C) (08/15 1248) Pulse Rate:  [64-97] 64 (08/15 1248) Resp:  [13-20] 19 (08/15 0610) BP: (108-152)/(66-94) 136/81 (08/15 1248) SpO2:  [96 %-100 %] 100 % (08/15 1248) Last BM Date : 04/29/24 General:   Alert, well-developed, in NAD EENT:  Normal hearing, non icteric sclera, conjunctive pink.  Heart:  Regular rate and rhythm; no murmur.  No lower extremity edema   Pulm: Normal respiratory effort, lungs CTA bilaterally without wheezes or crackles. Abdomen:  Soft, nondistended, nontender.  Normal bowel sounds,.       Neurologic:  Alert and  oriented x4;  grossly normal neurologically. Psych:  Pleasant, cooperative.  Normal mood and affect.   Intake/Output from previous day: 08/14 0701 - 08/15 0700 In: 436 [P.O.:120; Blood:316] Out: -  Intake/Output this shift: Total I/O In: 480 [Blood:480] Out: -   Lab Results: Recent Labs    04/30/24 1018 05/01/24 0328 05/01/24 1000  WBC 9.6 10.7*  --   HGB 6.5* 7.2* 9.7*  HCT 26.1* 27.1* 35.3*  PLT 227 201  --    BMET Recent Labs    04/30/24 1018 05/01/24 0328  NA 141 137  K 3.8 3.8  CL 109 106  CO2 22 22  GLUCOSE 145*  105*  BUN 11 11  CREATININE 0.93 1.01  CALCIUM  9.1 8.9   LFT Recent Labs    05/01/24 0328  PROT 6.4*  ALBUMIN 3.2*  AST 21  ALT 17  ALKPHOS 62  BILITOT 1.1   PT/INR No results for input(s): LABPROT, INR in the last 72 hours. Hepatitis Panel No results for input(s): HEPBSAG, HCVAB, HEPAIGM, HEPBIGM in the last 72 hours.  DG Chest 2 View Result Date: 04/30/2024 CLINICAL DATA:  Shortness of breath for 2 months EXAM: CHEST - 2 VIEW COMPARISON:  03/12/2024 FINDINGS: Frontal and lateral views of the chest demonstrate a stable cardiac silhouette. 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 No acute airspace disease, effusion, or pneumothorax. There are no acute bony abnormalities. IMPRESSION: 1. Stable chest, no acute process. Electronically Signed   By: Ozell Daring M.D.   On: 04/30/2024 10:39     Active Problems:   Atrial fibrillation   Symptomatic anemia   Chronic health problem   Asthma exacerbation     LOS: 1 day     Anselm Bring, MD 05/01/2024, 2:48 PM [facility] [phone]

## 2024-05-01 NOTE — Progress Notes (Signed)
     Daily Progress Note Intern Pager: [phone]  Patient name: SHELLIE GOETTL Medical record number: [id number] Date of birth: Sep 13, 1961 Age: 63 y.o. Gender: male  Primary Care Provider: Theotis Haze ORN, NP Consultants: GI Code Status: DNR  Pt Overview and Major Events to Date:  8/14: Admitted  8/16: EGD and colonoscopy  Assessment and Plan:  SUNIL HUE is a 63 y.o. male admitted for symptomatic anemia thought to be 2/2 to upper GI bleed w/ history of PUD; now undergoing workup with EGD and colonoscopy per GI. Assessment & Plan Symptomatic anemia Hgb stable. No melena this admit. S/P 2 units RBC.  - GI consulted, appreciate recs - IV protonix  BID - NPO 08/16 for EGD/colonoscopy - Transfusion threshold ,<7 - Trend daily CBC Asthma exacerbation Improving.  - Scheduled DuoNebs every 4 hours - Prednisone  course 50 mg p.o. 5 days (8/15 - 8/20) - Pulmicort  neb BID, discharge with ICS inhaler  Atrial fibrillation Stable. NSR on tele.  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 Euvolemic and recent echo was stable. EF 65%-70% with last echo in June. HLD: Continue atorvastatin  questionable HTN: Start lopressor  DM: Stop metformin  and monitor CBG Paraesophageal hernia: intrathoracic stomach and part of pancreas, could be a factor in SOB  FEN/GI: NPO pending procedure  PPx: SCDs, high risk for bleeding  Dispo:Home tomorrow. Barriers include ongoing GI workup.   Subjective:  Resting in recliner. Ready for procedure. Per RN, patient finished prep at 3 AM.   Objective: Temp:  [98.1 F (36.7 C)-98.6 F (37 C)] 98.2 F (36.8 C) (08/15 2012) Pulse Rate:  [64-81] 81 (08/15 2012) Resp:  [18-20] 20 (08/15 2012) BP: (108-136)/(68-85) 132/76 (08/15 2012) SpO2:  [96 %-100 %] 98 % (08/15 2012) Physical Exam: General:  Chronically ill-appearing, no acute distress Cardio: RRR, no murmur on exam Pulm: clear, No increased work of breathing Abdomen: Soft, bowel sounds present, nontender Extremity: No peripheral edema   Laboratory: Most recent CBC Lab Results  Component Value Date   WBC 10.7 (H) 05/01/2024   HGB 9.7 (L) 05/01/2024   HCT 35.3 (L) 05/01/2024   MCV 62.7 (L) 05/01/2024   PLT 201 05/01/2024   Most recent BMP    Latest Ref Rng & Units 05/01/2024    3:28 AM  BMP  Glucose 70 - 99 mg/dL 894   BUN 8 - 23 mg/dL 11   Creatinine 9.38 - 1.24 mg/dL 8.98   Sodium 864 - 854 mmol/L 137   Potassium 3.5 - 5.1 mmol/L 3.8   Chloride 98 - 111 mmol/L 106   CO2 22 - 32 mmol/L 22   Calcium  8.9 - 10.3 mg/dL 8.9    Imaging/Diagnostic Tests: EGD, colonoscopy pending   Cleotilde Perkins, DO 05/01/2024, 9:27 PM  PGY-3, [facility] FPTS Intern pager: [phone], text pages welcome Secure chat group [facility] Teaching Service

## 2024-05-01 NOTE — Evaluation (Signed)
 Physical Therapy Evaluation Patient Details Name: Jonathan Gonzalez MRN: [id number] DOB: [birth year] Today's Date: 05/01/2024  History of Present Illness  Mr. Branton is a 63yo male presenting with a 2 month h/o progressively worsening shortness of breath and fatigue in setting of anemia (Hgb)6.5. EGD and colonoscopy planned for 8/16.  PHMx:  DM2, HTN, HLD, HFmREF, GERD, persistent Afib on eliquis , asthma, paraesophageal hernia with intrathoracic stomach.   Clinical Impression  Patient is agreeable to PT evaluation. He is independent at baseline.  Today the patient requested to try the rollator for ambulation due to limited endurance with exertional activity. Education on energy conservation techniques, management of rollator breaks, and using the rollator for seated rest breaks as needed. Mild dyspnea with exertion with good understanding of use of rollator demonstrated. The patient is hopeful for discharge soon. PT will follow up to maximize independence. No anticipated PT needs after this hospital stay at this time.       If plan is discharge home, recommend the following: Assist for transportation   Can travel by private vehicle        Equipment Recommendations Rollator (4 wheels)  Recommendations for Other Services       Functional Status Assessment Patient has had a recent decline in their functional status and demonstrates the ability to make significant improvements in function in a reasonable and predictable amount of time.     Precautions / Restrictions Precautions Precautions: Other (comment) (low fall risk) Precaution/Restrictions Comments: monitor O2 and HR Restrictions Weight Bearing Restrictions Per Provider Order: No      Mobility  Bed Mobility Overal bed mobility:  (not assessed)                  Transfers Overall transfer level: Modified independent                 General transfer comment: increased time for standing     Ambulation/Gait Ambulation/Gait assistance: Modified independent (Device/Increase time) Gait Distance (Feet): 75 Feet Assistive device: Rollator (4 wheels) Gait Pattern/deviations: Step-through pattern, Trunk flexed Gait velocity: decreased     General Gait Details: patient requested to trial the rollator due to limited endurance with exertional activity. education on energy conservation, breaks management, and technique to use the rollator for a seated rest break as needed with carry over demonstrated. patient walked a short distance in the room without the rollator with supervision.  Stairs            Wheelchair Mobility     Tilt Bed    Modified Rankin (Stroke Patients Only)       Balance Overall balance assessment: Modified Independent                                           Pertinent Vitals/Pain Pain Assessment Pain Assessment: No/denies pain    Home Living Family/patient expects to be discharged to:: Private residence Living Arrangements: Children Available Help at Discharge: Family;Available 24 hours/day Type of Home: Other(Comment) (hotel) Home Access: Stairs to enter Entrance Stairs-Rails: None Entrance Stairs-Number of Steps: 3 (step>walk>step>walk>step)   Home Layout: One level Home Equipment: None Additional Comments: take uber/lyft for transportation    Prior Function Prior Level of Function : Independent/Modified Independent                     Extremity/Trunk Assessment   Upper Extremity Assessment  Upper Extremity Assessment: Overall WFL for tasks assessed    Lower Extremity Assessment Lower Extremity Assessment: Overall WFL for tasks assessed (endurance impaired for sustained activity in standing)       Communication   Communication Communication: No apparent difficulties    Cognition Arousal: Alert Behavior During Therapy: WFL for tasks assessed/performed   PT - Cognitive impairments: No apparent  impairments                         Following commands: Intact       Cueing       General Comments General comments (skin integrity, edema, etc.): mild dyspnea with exertion    Exercises     Assessment/Plan    PT Assessment Patient needs continued PT services  PT Problem List Decreased activity tolerance;Decreased mobility;Cardiopulmonary status limiting activity       PT Treatment Interventions DME instruction;Gait training;Stair training;Functional mobility training;Therapeutic exercise;Therapeutic activities    PT Goals (Current goals can be found in the Care Plan section)  Acute Rehab PT Goals Patient Stated Goal: to have a rollator for discharge PT Goal Formulation: With patient Time For Goal Achievement: 05/15/24 Potential to Achieve Goals: Good    Frequency Min 1X/week     Co-evaluation               AM-PAC PT 6 Clicks Mobility  Outcome Measure Help needed turning from your back to your side while in a flat bed without using bedrails?: None Help needed moving from lying on your back to sitting on the side of a flat bed without using bedrails?: None Help needed moving to and from a bed to a chair (including a wheelchair)?: None Help needed standing up from a chair using your arms (e.g., wheelchair or bedside chair)?: None Help needed to walk in hospital room?: None Help needed climbing 3-5 steps with a railing? : A Little 6 Click Score: 23    End of Session   Activity Tolerance: Patient tolerated treatment well Patient left: in chair;with call bell/phone within reach   PT Visit Diagnosis: Muscle weakness (generalized) (M62.81);Difficulty in walking, not elsewhere classified (R26.2)    Time: 9059-9045 PT Time Calculation (min) (ACUTE ONLY): 14 min   Charges:   PT Evaluation $PT Eval Low Complexity: 1 Low   PT General Charges $$ ACUTE PT VISIT: 1 Visit         Randine Essex, PT, MPT   Randine LULLA Essex 05/01/2024, 10:30  AM

## 2024-05-01 NOTE — Plan of Care (Signed)

## 2024-05-01 NOTE — Evaluation (Signed)
 Occupational Therapy Evaluation Patient Details Name: Jonathan Gonzalez MRN: [id number] DOB: June 09, 1961 Today's Date: 05/01/2024   History of Present Illness   Mr. Jonathan Gonzalez is a 63yo male presenting with a 2 month h/o progressively worsening shortness of breath and fatigue in setting of anemia (Hgb)6.5. EGD and colonoscopy planned for 8/16.  PHMx:  DM2, HTN, HLD, HFmREF, GERD, persistent Afib on eliquis , asthma, paraesophageal hernia with intrathoracic stomach.     Clinical Impressions This 63 yo male admitted with above presents to acute OT with PLOF of being totally independent with ADLs and some IADLs (son helps prn). No AD for ambulation and no home O2. He currently is Mod I/I with all basic ADLs but his WOB increases greatly with even small amounts to activity. He will continue to benefit from acute OT without need for follow up.     If plan is discharge home, recommend the following:   Assist for transportation     Functional Status Assessment   Patient has had a recent decline in their functional status and demonstrates the ability to make significant improvements in function in a reasonable and predictable amount of time.     Equipment Recommendations   None recommended by OT      Precautions/Restrictions   Precautions Precaution/Restrictions Comments: monitor O2 and HR Restrictions Weight Bearing Restrictions Per Provider Order: No     Mobility Bed Mobility Overal bed mobility: Independent                  Transfers Overall transfer level: Independent                        Balance Overall balance assessment: Independent                                         ADL either performed or assessed with clinical judgement   ADL Overall ADL's : Modified independent                                       General ADL Comments: Educated on purse lipped breathing and tripod breathing and had pt return  demonstrate to me. Also talked with him about the use of a shower seat (which his insurance will pay for) to help conserve energy and breathing would not be as labored (he declined us  getting one more him). We also talke about the use of a rollator (which he is interested in and so I made the PT aware to try one with him)     Vision Patient Visual Report: No change from baseline              Pertinent Vitals/Pain Pain Assessment Pain Assessment: No/denies pain     Extremity/Trunk Assessment Upper Extremity Assessment Upper Extremity Assessment: Overall WFL for tasks assessed           Communication Communication Communication: No apparent difficulties   Cognition Arousal: Alert Behavior During Therapy: WFL for tasks assessed/performed Cognition: No apparent impairments                               Following commands: Intact         General Comments      At rest  on RA pt's HR 70 and O2 sats 100%; on RA with up to bathroom and to recliner HR 87 and O2 sats 99%; on RA ambulating around the room 2 times HR 86 and O2 sats 99% (more audible work of breathing noted and pt concurred)           Home Living Family/patient expects to be discharged to:: Private residence Living Arrangements: Children Available Help at Discharge: Family;Available 24 hours/day Type of Home: Other(Comment) (hotel room) Home Access: Stairs to enter Entrance Stairs-Number of Steps: 3 (step>walk>step>walk>step) Entrance Stairs-Rails: None Home Layout: One level     Bathroom Shower/Tub: IT trainer: Standard     Home Equipment: None   Additional Comments: Hotel, take uber/lyft wherever they need to go      Prior Functioning/Environment Prior Level of Function : Independent/Modified Independent                    OT Problem List: Decreased activity tolerance   OT Treatment/Interventions: Self-care/ADL training;Energy  conservation;Patient/family education      OT Goals(Current goals can be found in the care plan section)   Acute Rehab OT Goals Patient Stated Goal: to be better and get home to son who depends on me OT Goal Formulation: With patient Time For Goal Achievement: 05/15/24 Potential to Achieve Goals: Good   OT Frequency:  Min 2X/week       AM-PAC OT 6 Clicks Daily Activity     Outcome Measure Help from another person eating meals?: None Help from another person taking care of personal grooming?: None Help from another person toileting, which includes using toliet, bedpan, or urinal?: None Help from another person bathing (including washing, rinsing, drying)?: None Help from another person to put on and taking off regular upper body clothing?: None Help from another person to put on and taking off regular lower body clothing?: None 6 Click Score: 24   End of Session Nurse Communication: Mobility status (pt okay to be up and about by himself)  Activity Tolerance:  (increased SOB with longer he was up and about in his room) Patient left:                     Time: 0828-0900 OT Time Calculation (min): 32 min Charges:  OT General Charges $OT Visit: 1 Visit OT Evaluation $OT Eval Moderate Complexity: 1 Mod OT Treatments $Self Care/Home Management : 8-22 mins  Donny BECKER OT Acute Rehabilitation Services Office [phone]    [place] Dorothyann Distel 05/01/2024, 9:44 AM

## 2024-05-01 NOTE — Assessment & Plan Note (Addendum)
-  GI consulted, appreciate recs - IV protonix  BID - Clear liquid diet 08/15, NPO 08/16 for EGD/colonoscopy - Transfusion threshold 7 - Trend daily CBC, post transfusion H&H - EGD/colonoscopy 08/16 after Eliquis  washout - UA microscopy significant for nitrites and leukocytes but no blood; patient denies any dysuria, hematuria, fever, flank pain, or any other urinary complaints

## 2024-05-01 NOTE — Assessment & Plan Note (Addendum)
 HFmrEF: Continue entresto  and spirinolactone. Euvolemic and recent echo was stable. EF 65%-70% with last echo in June. 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

## 2024-05-01 NOTE — Progress Notes (Signed)
 Daily Progress Note Intern Pager: [phone]  Patient name: Jonathan Gonzalez Medical record number: [id number] Date of birth: [birth year] Age: 63 y.o. Gender: male  Primary Care Provider: Theotis Haze ORN, NP Consultants: GI Code Status: DNR-prearrest  Pt Overview and Major Events to Date:  08/14: Admitted for symptomatic anemia  Assessment and Plan:  Jonathan Gonzalez is a 63 y.o. male admitted for symptomatic anemia 2/2 significant iron  deficiency anemia, suspect 2/2 upper GI bleed. Pertinent problems include HFmrEF, DM2, HTN, GERD, remote h/o PUD, persistent Afib on Eliquis  and amiodarone , asthma, paraesophageal hernia. GI is onboard care team, appreciate recommendations.  Assessment & Plan Symptomatic anemia -GI consulted, appreciate recs - IV protonix  BID - Clear liquid diet 08/15, NPO 08/16 for EGD/colonoscopy - Transfusion threshold 7 - Trend daily CBC, post transfusion H&H - EGD/colonoscopy 08/16 after Eliquis  washout - UA microscopy significant for nitrites and leukocytes but no blood; patient denies any dysuria, hematuria, fever, flank pain, or any other urinary complaints Asthma exacerbation Increased work of breathing on room air with diffuse expiratory wheezing.  Had 1 DuoNeb overnight with 3 L nasal cannula for comfort.  He was weaned off 3 L nasal cannula within 2 hours because it was not very helpful. -Scheduled DuoNebs every 4 hours -Prednisone  course 50 mg p.o. 5 days (8/15 - 8/20) Atrial fibrillation Patient NSR.  - Due to questionable metoprolol  tartrate adherence at home, will use metoprolol  succinate 25 mg twice daily - Continue amiodarone  200mg  daily - Eliquis  held for possible GI bleed, last dose 08/13 Chronic health problem HFmrEF: Continue entresto  and spirinolactone. Euvolemic and recent echo was stable. EF 65%-70% with last echo in June. HLD: Continue atorvastatin  questionable HTN: Start lopressor  DM: Stop metformin  and monitor CBG Paraesophageal  hernia: intrathoracic stomach and part of pancreas, could be a factor in SOB  FEN/GI: clear liquids PPx: hold for GI studies 2/2 possible upper GI bleed Dispo:Home in 2-3 days.   Subjective:  Patient was seen and examined at bedside.  He is sitting at a 45 degree angle however breathing uncomfortably.  He speaks to me in short sentences and stops to take breaths in between.  He states his breathing today is worse than it usually is at home.  He did receive a breathing treatment overnight for wheezing and states it helped a little bit.  Objective: Temp:  [97.9 F (36.6 C)-98.6 F (37 C)] 98.3 F (36.8 C) (08/15 0610) Pulse Rate:  [65-113] 66 (08/15 0610) Resp:  [13-21] 19 (08/15 0610) BP: (108-155)/(66-94) 126/74 (08/15 0610) SpO2:  [96 %-100 %] 98 % (08/15 0610) Weight:  [99.8 kg] 99.8 kg (08/14 1002)  Physical Exam: General: A&O,  Cardiac: RRR, no m/r/g Respiratory: increased WOB, diffuse expiratory wheezing, speaks in short sentences, diffuse expiratory wheezing to bilateral upper lobes GI: Soft, NTTP, distended Extremities: NTTP, no peripheral edema. Neuro: Normal gait, moves all four extremities appropriately. Psych: Appropriate mood and affect   Laboratory: Most recent CBC Lab Results  Component Value Date   WBC 10.7 (H) 05/01/2024   HGB 7.2 (L) 05/01/2024   HCT 27.1 (L) 05/01/2024   MCV 62.7 (L) 05/01/2024   PLT 201 05/01/2024   Most recent BMP    Latest Ref Rng & Units 05/01/2024    3:28 AM  BMP  Glucose 70 - 99 mg/dL 894   BUN 8 - 23 mg/dL 11   Creatinine 9.38 - 1.24 mg/dL 8.98   Sodium 864 - 854 mmol/L 137   Potassium  3.5 - 5.1 mmol/L 3.8   Chloride 98 - 111 mmol/L 106   CO2 22 - 32 mmol/L 22   Calcium  8.9 - 10.3 mg/dL 8.9    Lupie Credit, DO 05/01/2024, 7:06 AM  PGY-1, [facility] FPTS Intern pager: [phone], text pages welcome Secure chat group [facility] Teaching Service

## 2024-05-01 NOTE — Progress Notes (Signed)
    Durable Medical Equipment  (From admission, onward)           Start     Ordered   05/01/24 1533  For home use only DME 4 wheeled rolling walker with seat  Once       Question Answer Comment  Patient needs a walker to treat with the following condition Shortness of breath   Patient needs a walker to treat with the following condition Anemia      05/01/24 1535

## 2024-05-01 NOTE — Assessment & Plan Note (Signed)
 Stable. NSR on tele.  - Metoprolol  succinate 25 mg twice daily - Continue amiodarone  200mg  daily - Eliquis  held for possible GI bleed, last dose 08/13

## 2024-05-02 ENCOUNTER — Inpatient Hospital Stay (HOSPITAL_COMMUNITY): Admitting: Anesthesiology

## 2024-05-02 ENCOUNTER — Other Ambulatory Visit (HOSPITAL_COMMUNITY): Payer: Self-pay

## 2024-05-02 ENCOUNTER — Encounter (HOSPITAL_COMMUNITY): Payer: Self-pay

## 2024-05-02 ENCOUNTER — Encounter (HOSPITAL_COMMUNITY)
Admission: EM | Disposition: A | Discharge status: Home / Self Care | Payer: Self-pay | Source: Home / Self Care | Attending: Family Medicine

## 2024-05-02 DIAGNOSIS — E119 Type 2 diabetes mellitus without complications: Secondary | ICD-10-CM | POA: Diagnosis not present

## 2024-05-02 DIAGNOSIS — I4891 Unspecified atrial fibrillation: Secondary | ICD-10-CM

## 2024-05-02 DIAGNOSIS — K2970 Gastritis, unspecified, without bleeding: Secondary | ICD-10-CM

## 2024-05-02 DIAGNOSIS — K5730 Diverticulosis of large intestine without perforation or abscess without bleeding: Secondary | ICD-10-CM

## 2024-05-02 DIAGNOSIS — K2289 Other specified disease of esophagus: Secondary | ICD-10-CM

## 2024-05-02 DIAGNOSIS — D124 Benign neoplasm of descending colon: Secondary | ICD-10-CM | POA: Diagnosis not present

## 2024-05-02 DIAGNOSIS — K635 Polyp of colon: Secondary | ICD-10-CM

## 2024-05-02 DIAGNOSIS — K3189 Other diseases of stomach and duodenum: Secondary | ICD-10-CM | POA: Diagnosis not present

## 2024-05-02 DIAGNOSIS — K640 First degree hemorrhoids: Secondary | ICD-10-CM

## 2024-05-02 DIAGNOSIS — K922 Gastrointestinal hemorrhage, unspecified: Secondary | ICD-10-CM | POA: Diagnosis not present

## 2024-05-02 DIAGNOSIS — D649 Anemia, unspecified: Secondary | ICD-10-CM | POA: Diagnosis not present

## 2024-05-02 DIAGNOSIS — D509 Iron deficiency anemia, unspecified: Secondary | ICD-10-CM | POA: Diagnosis not present

## 2024-05-02 HISTORY — PX: POLYPECTOMY: SHX149

## 2024-05-02 HISTORY — PX: BONE BIOPSY: SHX375

## 2024-05-02 HISTORY — PX: ESOPHAGOGASTRODUODENOSCOPY: SHX5428

## 2024-05-02 HISTORY — PX: COLONOSCOPY: SHX5424

## 2024-05-02 LAB — BPAM RBC
Blood Product Expiration Date: 202509072359
Blood Product Expiration Date: 202509112359
ISSUE DATE / TIME: 202508141253
ISSUE DATE / TIME: 202508150432
Unit Type and Rh: 5100
Unit Type and Rh: 5100

## 2024-05-02 LAB — TYPE AND SCREEN
ABO/RH(D): O POS
Antibody Screen: NEGATIVE
Unit division: 0
Unit division: 0

## 2024-05-02 LAB — CBC
HCT: 34.9 % — ABNORMAL LOW (ref 39.0–52.0)
Hemoglobin: 9.6 g/dL — ABNORMAL LOW (ref 13.0–17.0)
MCH: 18 pg — ABNORMAL LOW (ref 26.0–34.0)
MCHC: 27.5 g/dL — ABNORMAL LOW (ref 30.0–36.0)
MCV: 65.5 fL — ABNORMAL LOW (ref 80.0–100.0)
Platelets: 245 K/uL (ref 150–400)
RBC: 5.33 MIL/uL (ref 4.22–5.81)
RDW: 28.4 % — ABNORMAL HIGH (ref 11.5–15.5)
WBC: 14.4 K/uL — ABNORMAL HIGH (ref 4.0–10.5)
nRBC: 0 % (ref 0.0–0.2)

## 2024-05-02 SURGERY — EGD (ESOPHAGOGASTRODUODENOSCOPY)
Anesthesia: Monitor Anesthesia Care

## 2024-05-02 MED ORDER — LIDOCAINE 2% (20 MG/ML) 5 ML SYRINGE
INTRAMUSCULAR | Status: DC | PRN
Start: 2024-05-02 — End: 2024-05-02
  Administered 2024-05-02: 80 mg via INTRAVENOUS

## 2024-05-02 MED ORDER — OMEPRAZOLE 20 MG PO CPDR
20.0000 mg | DELAYED_RELEASE_CAPSULE | Freq: Every day | ORAL | 0 refills | Status: DC
Start: 2024-05-02 — End: 2024-05-13
  Filled 2024-05-02: qty 30, 30d supply, fill #0

## 2024-05-02 MED ORDER — PROPOFOL 10 MG/ML IV BOLUS
INTRAVENOUS | Status: DC | PRN
  Administered 2024-05-02 (×2): 10 mg via INTRAVENOUS
  Administered 2024-05-02 (×2): 20 mg via INTRAVENOUS
  Administered 2024-05-02: 40 mg via INTRAVENOUS
  Administered 2024-05-02 (×2): 20 mg via INTRAVENOUS

## 2024-05-02 MED ORDER — METOPROLOL SUCCINATE ER 50 MG PO TB24
50.0000 mg | ORAL_TABLET | Freq: Every day | ORAL | 0 refills | Status: DC
  Filled 2024-05-02: qty 30, 30d supply, fill #0

## 2024-05-02 MED ORDER — FERROUS SULFATE 325 (65 FE) MG PO TABS: 325.0000 mg | ORAL_TABLET | Freq: Every day | ORAL | Status: DC

## 2024-05-02 MED ORDER — FERROUS SULFATE 325 (65 FE) MG PO TABS
325.0000 mg | ORAL_TABLET | Freq: Every day | ORAL | 0 refills | Status: DC
  Filled 2024-05-02: qty 30, 30d supply, fill #0

## 2024-05-02 MED ORDER — PROPOFOL 500 MG/50ML IV EMUL
INTRAVENOUS | Status: DC | PRN
Start: 2024-05-02 — End: 2024-05-02
  Administered 2024-05-02: 150 ug/kg/min via INTRAVENOUS

## 2024-05-02 MED ORDER — PHENYLEPHRINE HCL (PRESSORS) 10 MG/ML IV SOLN
INTRAVENOUS | Status: DC | PRN
  Administered 2024-05-02 (×3): 120 ug via INTRAVENOUS

## 2024-05-02 NOTE — Op Note (Signed)
 [facility] Patient Name: Jonathan Gonzalez Procedure Date : 05/02/2024 MRN: [id number] Attending MD: Lynnie Bring , MD, [id number] Date of Birth: 11/02/1960 CSN: [id number] Age: 63 Admit Type: Inpatient Procedure:                Upper GI endoscopy Indications:              Iron  deficiency anemia with heme-negative stools.                            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 Providers:                Lynnie Bring, MD, Collene Edu, RN, Haskel Chris,                            Technician Referring MD:              Medicines:                Monitored Anesthesia Care Complications:            No immediate complications. Estimated Blood Loss:     Estimated blood loss: none. Procedure:                Pre-Anesthesia Assessment:                           - Prior to the procedure, a History and Physical                            was performed, and patient medications and                            allergies were reviewed. The patient's tolerance of                            previous anesthesia was also reviewed. The risks                            and benefits of the procedure and the sedation                            options and risks were discussed with the patient.                            All questions were answered, and informed consent                            was obtained. Prior Anticoagulants: Eliquis  was                            held 1 day prior. ASA Grade Assessment: III - A  patient with severe systemic disease. After                            reviewing the risks and benefits, the patient was                            deemed in satisfactory condition to undergo the                            procedure.                           After obtaining informed consent, the endoscope was                            passed under direct vision. Throughout  the                            procedure, the patient's blood pressure, pulse, and                            oxygen saturations were monitored continuously. The                            [id number]-[id number] ([id number]) Olympus endoscope was introduced                            through the mouth, and advanced to the second part                            of duodenum. The upper GI endoscopy was somewhat                            difficult due to abnormal anatomy. The patient                            tolerated the procedure well. Scope In: Scope Out: Findings:      The examined esophagus was normal. Salmon-colored mucosa projecting 1 cm       above the GE junction. Few biopsies were obtained for histology.      Distorted anatomy d/t stomach being intrathoracic. No active bleeding.       No Cameron erosions or ulcers.      Localized minimal inflammation characterized by congestion (edema) and       erythema was found in the gastric antrum. Biopsies were taken with a       cold forceps for histology.      Duodenum could only be intubated in a long retroflexed position. The       examined duodenum was normal. Biopsies for histology were taken with a       cold forceps for evaluation of celiac disease. Impression:               - ?Short segment Barrett's esophagus (biopsied).                           -  Distorted anatomy d/t intrathoracic position of                            stomach.                           - Mild Gastritis. Biopsied.                           - Normal examined duodenum. Biopsied. Recommendation:           - Patient has a contact number available for                            emergencies. The signs and symptoms of potential                            delayed complications were discussed with the                            patient. Return to normal activities tomorrow.                            Written discharge instructions were provided to the                             patient.                           - Continue present medications including Protonix .                           - Await pathology results.                           - Proceed with colonoscopy.                           - The findings and recommendations were discussed                            with the patient. Procedure Code(s):        --- Professional ---                           [phone], Esophagogastroduodenoscopy, flexible,                            transoral; with biopsy, single or multiple Diagnosis Code(s):        --- Professional ---                           K22.89, Other specified disease of esophagus                           K29.70, Gastritis, unspecified, without bleeding  D50.9, Iron  deficiency anemia, unspecified CPT copyright 2022 American Medical Association. All rights reserved. The codes documented in this report are preliminary and upon coder review may  be revised to meet current compliance requirements. Lynnie Bring, MD 05/02/2024 11:21:38 AM This report has been signed electronically. Number of Addenda: 0

## 2024-05-02 NOTE — Interval H&P Note (Signed)
 History and Physical Interval Note:  05/02/2024 9:58 AM  Jonathan Gonzalez  has presented today for surgery, with the diagnosis of GI bleed.  The various methods of treatment have been discussed with the patient and family. After consideration of risks, benefits and other options for treatment, the patient has consented to  Procedure(s): EGD (ESOPHAGOGASTRODUODENOSCOPY) (N/A) COLONOSCOPY (N/A) as a surgical intervention.  The patient's history has been reviewed, patient examined, no change in status, stable for surgery.  I have reviewed the patient's chart and labs.  Questions were answered to the patient's satisfaction.     Lynnie Bring

## 2024-05-02 NOTE — TOC CM/SW Note (Signed)
 Pt has been DC. Pt is waiting for the rollator. Per CM note, she contacted [employer] for the DME referral. Contacted Jermaine at [facility], he reports the rollator will be delivered today in 30 minutes.

## 2024-05-02 NOTE — Progress Notes (Signed)
 DC order noted per MD. DC RN at bedside. Confirmed 4WW delivered to bedside. No further DME/CM needs. AVS printed/reviewed with patient. Instructed patient on importance of adherence to med treatment and provider appts. PIV removed. Skin intact. All belongings accounted for. Patient taken downstairs to dc lounge to await TOC meds. DC lounge informed of patient arrival, TOC med need, and taxi voucher request.

## 2024-05-02 NOTE — Transfer of Care (Signed)
 Immediate Anesthesia Transfer of Care Note  Patient: Jonathan Gonzalez  Procedure(s) Performed: EGD (ESOPHAGOGASTRODUODENOSCOPY) COLONOSCOPY BIOPSY, GI POLYPECTOMY, INTESTINE  Patient Location: PACU  Anesthesia Type:MAC  Level of Consciousness: awake and alert   Airway & Oxygen Therapy: Patient Spontanous Breathing and Patient connected to face mask oxygen  Post-op Assessment: Report given to RN and Post -op Vital signs reviewed and unstable, Anesthesiologist notified  Post vital signs: Reviewed and stable  Last Vitals:  Vitals Value Taken Time  BP 128/63 05/02/24 11:27  Temp    Pulse 90 05/02/24 11:28  Resp 17 05/02/24 11:28  SpO2 97 % 05/02/24 11:28  Vitals shown include unfiled device data.  Last Pain:  Vitals:   05/02/24 1033  TempSrc:   PainSc: 0-No pain      Patients Stated Pain Goal: 0 (04/30/24 2306)  Complications: No notable events documented.

## 2024-05-02 NOTE — Progress Notes (Signed)
 Occupational Therapy Treatment Patient Details Name: Jonathan Gonzalez MRN: [id number] DOB: 08/13/1961 Today's Date: 05/02/2024   History of present illness Jonathan Gonzalez is a 63yo male presenting with a 2 month h/o progressively worsening shortness of breath and fatigue in setting of anemia (Hgb)6.5. EGD and colonoscopy planned for 8/16.  PHMx:  DM2, HTN, HLD, HFmREF, GERD, persistent Afib on eliquis , asthma, paraesophageal hernia with intrathoracic stomach.   OT comments  Patient received up in recliner and fully dressed. COTA provided patient with handout for 5 P's for energy conservations and reviewed with patient. Patient demonstrated good understanding of handout. Patient states that he believes his biggest problem is that he often holds his breath when performing tasks. COTA reinforced being aware to breath and use PLB to recover when necessary. Discharge recommendations continue to be appropriate. Acute OT to continue to follow to address established goal.        If plan is discharge home, recommend the following:  Assist for transportation   Equipment Recommendations  None recommended by OT    Recommendations for Other Services      Precautions / Restrictions Precautions Precautions: Other (comment) (low fall risk) Precaution/Restrictions Comments: monitor O2 and HR Restrictions Weight Bearing Restrictions Per Provider Order: No       Mobility Bed Mobility               General bed mobility comments: OOB in recliner    Transfers Overall transfer level: Modified independent                       Balance Overall balance assessment: Modified Independent                                         ADL either performed or assessed with clinical judgement   ADL Overall ADL's : Modified independent                                       General ADL Comments: fully dressed when therapist entered room. focused on energy  conservation strategies    Extremity/Trunk Assessment              Vision       Perception     Praxis     Communication Communication Communication: No apparent difficulties   Cognition Arousal: Alert Behavior During Therapy: WFL for tasks assessed/performed Cognition: No apparent impairments                               Following commands: Intact        Cueing      Exercises      Shoulder Instructions       General Comments handout provided on 5 P's of energy conservations and reviewed with patient.    Pertinent Vitals/ Pain       Pain Assessment Pain Assessment: No/denies pain  Home Living                                          Prior Functioning/Environment              Frequency  Min 2X/week        Progress Toward Goals  OT Goals(current goals can now be found in the care plan section)  Progress towards OT goals: Progressing toward goals  Acute Rehab OT Goals Patient Stated Goal: to go home OT Goal Formulation: With patient Time For Goal Achievement: 05/15/24 Potential to Achieve Goals: Good ADL Goals Additional ADL Goal #1: Pt will be aware of energy conservation strategies from handout that will be of benefit to him now and on return home.  Plan      Co-evaluation                 AM-PAC OT 6 Clicks Daily Activity     Outcome Measure   Help from another person eating meals?: None Help from another person taking care of personal grooming?: None Help from another person toileting, which includes using toliet, bedpan, or urinal?: None Help from another person bathing (including washing, rinsing, drying)?: None Help from another person to put on and taking off regular upper body clothing?: None Help from another person to put on and taking off regular lower body clothing?: None 6 Click Score: 24    End of Session        Activity Tolerance Patient tolerated treatment well   Patient  Left in chair;with call bell/phone within reach   Nurse Communication Mobility status        Time: 9276-9261 OT Time Calculation (min): 15 min  Charges: OT General Charges $OT Visit: 1 Visit OT Treatments $Therapeutic Activity: 8-22 mins  Dick Laine, OTA Acute Rehabilitation Services  Office [phone]   Jeb LITTIE Laine 05/02/2024, 12:50 PM

## 2024-05-02 NOTE — Op Note (Signed)
 [facility] Patient Name: Jonathan Gonzalez Procedure Date : 05/02/2024 MRN: [id number] Attending MD: Lynnie Bring , MD, [id number] Date of Birth: March 10, 1961 CSN: [id number] Age: 63 Admit Type: Inpatient Procedure:                Colonoscopy Indications:              Iron  deficiency anemia with heme-negative stools. Providers:                Lynnie Bring, MD, Collene Edu, RN, Haskel Chris,                            Technician Referring MD:              Medicines:                Monitored Anesthesia Care Complications:            No immediate complications. Estimated Blood Loss:     Estimated blood loss: none. Procedure:                Pre-Anesthesia Assessment:                           - Prior to the procedure, a History and Physical                            was performed, and patient medications and                            allergies were reviewed. The patient's tolerance of                            previous anesthesia was also reviewed. The risks                            and benefits of the procedure and the sedation                            options and risks were discussed with the patient.                            All questions were answered, and informed consent                            was obtained. Prior Anticoagulants: The patient has                            taken Eliquis  (apixaban ), last dose was 1 day prior                            to procedure. ASA Grade Assessment: III - A patient                            with severe systemic disease. After reviewing the  risks and benefits, the patient was deemed in                            satisfactory condition to undergo the procedure.                           After obtaining informed consent, the colonoscope                            was passed under direct vision. Throughout the                            procedure, the patient's blood pressure, pulse, and                             oxygen saturations were monitored continuously. The                            [id number] ([id number] Olympus colonoscope was                            introduced through the anus and advanced to the 2                            cm into the ileum. The colonoscopy was performed                            without difficulty. The patient tolerated the                            procedure well. The quality of the bowel                            preparation was good. The terminal ileum, ileocecal                            valve, appendiceal orifice, and rectum were                            photographed. Scope In: 11:03:31 AM Scope Out: 11:15:22 AM Scope Withdrawal Time: 0 hours 9 minutes 6 seconds  Total Procedure Duration: 0 hours 11 minutes 51 seconds  Findings:      A 4 mm polyp was found in the mid descending colon. The polyp was       pedunculated. The polyp was removed with a cold snare. Resection and       retrieval were complete.      Multiple medium-mouthed and small-mouthed diverticula were found in the       sigmoid colon, few in transverse colon and ascending colon.      Non-bleeding internal hemorrhoids were found during retroflexion and       during perianal exam. The hemorrhoids were moderate and Grade I       (internal hemorrhoids that do not prolapse).      Retroflexion in the right colon was performed.  The terminal ileum appeared normal.      The exam was otherwise without abnormality on direct and retroflexion       views. Impression:               - One 4 mm polyp in the mid descending colon,                            removed with a cold snare. Resected and retrieved.                           - Pancolonic diverticulosis predominantly in the                            sigmoid colon.                           - Non-bleeding internal hemorrhoids.                           - The examined portion of the ileum was normal.                           -  The examination was otherwise normal on direct                            and retroflexion views. Recommendation:           - Patient has a contact number available for                            emergencies. The signs and symptoms of potential                            delayed complications were discussed with the                            patient. Return to normal activities tomorrow.                            Written discharge instructions were provided to the                            patient.                           - Resume previous diet.                           - Continue present medications.                           - Await pathology results.                           - If needed, resume Eliquis  8/17.                           -  For IDA, recommend hematology consultation. Since                            patient had heme-negative stools, no indication for                            VCE at this time. Will sign off for now.                           - Repeat colonoscopy for surveillance based on                            pathology results.                           - The findings and recommendations were discussed                            with the patient. Procedure Code(s):        --- Professional ---                           [phone], Colonoscopy, flexible; with removal of                            tumor(s), polyp(s), or other lesion(s) by snare                            technique Diagnosis Code(s):        --- Professional ---                           D12.4, Benign neoplasm of descending colon                           K64.0, First degree hemorrhoids                           D50.9, Iron  deficiency anemia, unspecified                           K57.30, Diverticulosis of large intestine without                            perforation or abscess without bleeding CPT copyright 2022 American Medical Association. All rights reserved. The codes documented in this report are  preliminary and upon coder review may  be revised to meet current compliance requirements. Lynnie Bring, MD 05/02/2024 11:27:05 AM This report has been signed electronically. Number of Addenda: 0

## 2024-05-02 NOTE — Discharge Instructions (Signed)
 Dear [name],  Thank you for letting us  participate in your care. You were hospitalized for anemia, and diagnosed with iron  deficiency.  POST-HOSPITAL & CARE INSTRUCTIONS Please do not take NSAIDs this includes Goody's powder, naproxen, Aleve, ibuprofen, Advil You can take Tylenol  for pain Please take your iron  pill every morning with breakfast Continue to take your Eliquis  for now, however if you develop bleeds is very important you stop this medication and tell your doctor immediately I recommend discussion with your PCP to make sure asthma is well-controlled I recommend you undergo additional workup as an outpatient with your PCP, they could consider celiac testing given your iron  deficiency anemia.  Additionally, you may benefit from seeing a hematologist-your PCP can discuss this with you. Go to your follow up appointments (listed below)   DOCTOR'S APPOINTMENT   Future Appointments  Date Time Provider Department Center  05/28/2024 10:30 AM Cindie Ole DASEN, MD [facility]  10/01/2024  1:30 PM [facility] PA/NP [facility] None    Follow-up Information     [facility] Follow up.   Why: [employer] will deliver a rolator to your hospital room before you are discharged home. Contact information: [address] [place] [address] [phone]                 Take care and be well!  Family Medicine Teaching Service Inpatient Team [place]  [facility]  [address] [place], [place] [address] [phone]

## 2024-05-02 NOTE — Anesthesia Preprocedure Evaluation (Signed)
 Anesthesia Evaluation  Patient identified by MRN, date of birth, ID band Patient awake    Reviewed: Allergy & Precautions, H&P , NPO status , Patient's Chart, lab work & pertinent test results  Airway Mallampati: II   Neck ROM: full    Dental   Pulmonary asthma    breath sounds clear to auscultation       Cardiovascular hypertension, + dysrhythmias Atrial Fibrillation  Rhythm:regular Rate:Normal     Neuro/Psych    GI/Hepatic ,GERD  ,,GI bleed   Endo/Other  diabetes, Type 2  Class 3 obesity  Renal/GU      Musculoskeletal   Abdominal   Peds  Hematology  (+) Blood dyscrasia, anemia Hemoglobin 9.6   Anesthesia Other Findings   Reproductive/Obstetrics                              Anesthesia Physical Anesthesia Plan  ASA: 3  Anesthesia Plan: MAC   Post-op Pain Management:    Induction: Intravenous  PONV Risk Score and Plan: 1 and Propofol  infusion and Treatment may vary due to age or medical condition  Airway Management Planned: Nasal Cannula  Additional Equipment:   Intra-op Plan:   Post-operative Plan:   Informed Consent: I have reviewed the patients History and Physical, chart, labs and discussed the procedure including the risks, benefits and alternatives for the proposed anesthesia with the patient or authorized representative who has indicated his/her understanding and acceptance.     Dental advisory given  Plan Discussed with: CRNA, Anesthesiologist and Surgeon  Anesthesia Plan Comments:         Anesthesia Quick Evaluation

## 2024-05-02 NOTE — Discharge Summary (Addendum)
 Family Medicine Teaching [facility] Discharge Summary  Patient name: Jonathan Gonzalez Medical record number: [id number] Date of birth: [birth year] Age: 63 y.o. Gender: male Date of Admission: 04/30/2024  Date of Discharge: 05/02/2024 Admitting Physician: Camie Dixons, DO  Primary Care Provider: Theotis Haze ORN, NP Consultants: GI  Indication for Hospitalization: Weakness, dyspnea  Brief Hospital Course:  63 year old male with a past medical history of past medical history of HFmREF, A-Fib on eliquis  and amiodarone , HTN, and DM who was admitted to the [facility] at [facility] with SOB and fatigue x2 months.    Symptomatic anemia Patient with history of declining Hgb since December 2024. 12.7>6.5 today.  Significant iron  deficiency, ferritin:3 and serum iron  15.  Presented to the ED June 2025 with suspected GI bleed but left AMA prior to workup. Reported hematuria and UA showed no hematuria. Patient was transfused with a transfusion goal of 7.5 given his profound symptoms.  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 GI was consulted who recommended EGD/colonoscopy and IV iron  infusion. EGD colonoscopy showed no active bleed, biopsies taken and polyp from colon removed. At time of discharge Hgb was 9.6 and stable. Symptoms resolved. Blood thinners were resumed at discharge see below.  Recommend outpatient referral to hematology.  AFIB Patient presented in NSR. Held eliquis  for anemia as above. Continued amiodarone  and switched toprolol to lopressor  to titrate metoprolol  dosing. At time of discharge patient remained NSR and sent home with the following regimen: Toprol  50 mg daily and amiodarone  200 mg daily.  Since no active GI bleed was discovered, shared decision making was used regarding continuation of Eliquis .  After discussion  of risk and benefits, patient is opted to continue Eliquis  at this time.  Instructed to monitor for signs and symptoms of bleeding.  DME needs Patient provided with rollator per PT recommendations.  Other chronic conditions were medically managed with home medications and formulary alternatives as necessary (HTN,HLD. HFmrEF)  Follow-up recommendations Confirm patients GDMT regimen Consider maintenance inhaler for asthma if wheezing Recommend IV iron  infusion outpatient Recommend follow-up with GI outpatient Consider further investigation of iron -deficiency anemia, such as celiac testing and consideration of hematology referral  Disposition: Home  Discharge Condition: stable  Discharge Exam: Per progress note 8/16 General: Chronically ill-appearing, no acute distress Cardio: RRR, no murmur on exam Pulm: clear, No increased work of breathing Abdomen: Soft, bowel sounds present, nontender Extremity: No peripheral edema   Significant Procedures: EGD/Colonoscopy  Significant Labs and Imaging:  Recent Labs  Lab 05/01/24 0328 05/01/24 1000 05/02/24 0403  WBC 10.7*  --  14.4*  HGB 7.2* 9.7* 9.6*  HCT 27.1* 35.3* 34.9*  PLT 201  --  245   Recent Labs  Lab 05/01/24 0328  NA 137  K 3.8  CL 106  CO2 22  GLUCOSE 105*  BUN 11  CREATININE 1.01  CALCIUM  8.9  ALKPHOS 62  AST 21  ALT 17  ALBUMIN 3.2*   Results/Tests Pending at Time of Discharge: None  Discharge Medications:  Allergies as of 05/02/2024   No Known Allergies      Medication List     STOP taking these medications    naproxen sodium 220 MG tablet Commonly known as: ALEVE       TAKE these medications    amiodarone  200 MG tablet Commonly known as: PACERONE  Take  1 tablet (200 mg total) by mouth daily.   Eliquis  5 MG Tabs tablet Generic drug: apixaban  Take 1 tablet (5 mg total) by mouth 2 (two) times daily.   Entresto  24-26 MG Generic drug: sacubitril -valsartan  Take 1 tablet by mouth 2 (two)  times daily.   ferrous sulfate  325 (65 FE) MG tablet Take 1 tablet (325 mg total) by mouth daily with breakfast. Start taking on: May 03, 2024   furosemide  40 MG tablet Commonly known as: Lasix  Take 1 tablet (40 mg total) by mouth every other day. What changed:  when to take this reasons to take this   metFORMIN  500 MG tablet Commonly known as: GLUCOPHAGE  Take 1 tablet (500 mg total) by mouth 2 (two) times daily with a meal. What changed: when to take this   metoprolol  succinate 50 MG 24 hr tablet Commonly known as: Toprol  XL Take 1 tablet (50 mg total) by mouth daily. Take with or immediately following a meal.   omeprazole  20 MG capsule Commonly known as: PRILOSEC Take 1 capsule (20 mg total) by mouth daily. FOR ACID REFLUX   spironolactone  25 MG tablet Commonly known as: ALDACTONE  Take 0.5 tablets (12.5 mg total) by mouth daily.   Ventolin  HFA 108 (90 Base) MCG/ACT inhaler Generic drug: albuterol  Inhale 1-2 puffs into the lungs every 6 (six) hours as needed for wheezing or shortness of breath.               Durable Medical Equipment  (From admission, onward)           Start     Ordered   05/01/24 1540  For home use only DME 4 wheeled rolling walker with seat  Once       Question Answer Comment  Patient needs a walker to treat with the following condition Shortness of breath   Patient needs a walker to treat with the following condition Anemia      05/01/24 1539            Discharge Instructions: Please refer to Patient Instructions section of EMR for full details.  Patient was counseled important signs and symptoms that should prompt return to medical care, changes in medications, dietary instructions, activity restrictions, and follow up appointments.   Follow-Up Appointments:  Follow-up Information     [facility] Follow up.   Why: [facility] will deliver a rolator to your hospital room before you are discharged home. Contact  information: [address] [place] [address] [phone]         Theotis Haze ORN, NP. Schedule an appointment as soon as possible for a visit.   Specialty: Nurse Practitioner Why: Please make appointment to see PCP next week. Contact information: [address] [place] [address] [phone]                 Howell Lunger, DO 05/02/2024, 1:40 PM PGY-3, [facility] Family Medicine

## 2024-05-04 ENCOUNTER — Telehealth: Payer: Self-pay | Admitting: Nurse Practitioner

## 2024-05-04 ENCOUNTER — Telehealth: Payer: Self-pay | Admitting: *Deleted

## 2024-05-04 NOTE — Transitions of Care (Post Inpatient/ED Visit) (Signed)
   05/04/2024  Name: RYLEIGH BUENGER MRN: [id number] DOB: 03/14/1961  Today's TOC FU Call Status: Today's TOC FU Call Status:: Unsuccessful Call (1st Attempt) Unsuccessful Call (1st Attempt) Date: 05/04/24  Attempted to reach the patient regarding the most recent Inpatient/ED visit.  Follow Up Plan: Additional outreach attempts will be made to reach the patient to complete the Transitions of Care (Post Inpatient/ED visit) call.   Andrea Dimes RN, BSN [place]  [facility] RN Care Manager [phone]

## 2024-05-04 NOTE — Telephone Encounter (Signed)
 Copied from CRM #[id number]. Topic: Appointments - Scheduling Inquiry for Clinic >> May 04, 2024 10:12 AM Jonathan Gonzalez wrote:  Patient needs a hospital follow up. None available within 14 days from 05/02/24  Callback #: [id number]

## 2024-05-04 NOTE — Telephone Encounter (Signed)
 Called patient verified name and date of birth. Patient appointment has been scheduled for 05/13/2024 at 9:30 am.

## 2024-05-04 NOTE — Anesthesia Postprocedure Evaluation (Signed)
 Anesthesia Post Note  Patient: Jonathan Gonzalez  Procedure(s) Performed: EGD (ESOPHAGOGASTRODUODENOSCOPY) COLONOSCOPY BIOPSY, GI POLYPECTOMY, INTESTINE     Patient location during evaluation: PACU Anesthesia Type: MAC Level of consciousness: awake and alert Pain management: pain level controlled Vital Signs Assessment: post-procedure vital signs reviewed and stable Respiratory status: spontaneous breathing, nonlabored ventilation, respiratory function stable and patient connected to nasal cannula oxygen Cardiovascular status: stable and blood pressure returned to baseline Postop Assessment: no apparent nausea or vomiting Anesthetic complications: no   No notable events documented.  Last Vitals:  Vitals:   05/02/24 1140 05/02/24 1249  BP: 117/79 114/68  Pulse: 86 81  Resp: 16 20  Temp:  (!) 36.4 C  SpO2: 100% 100%    Last Pain:  Vitals:   05/02/24 1140  TempSrc:   PainSc: 0-No pain                 Cortney Mckinney S

## 2024-05-05 LAB — SURGICAL PATHOLOGY

## 2024-05-06 ENCOUNTER — Encounter (HOSPITAL_COMMUNITY): Payer: Self-pay | Admitting: Gastroenterology

## 2024-05-06 ENCOUNTER — Telehealth: Payer: Self-pay | Admitting: *Deleted

## 2024-05-06 NOTE — Transitions of Care (Post Inpatient/ED Visit) (Signed)
 05/06/2024  Name: Jonathan Gonzalez MRN: [id number] DOB: 1961-03-19  Today's TOC FU Call Status: Today's TOC FU Call Status:: Successful TOC FU Call Completed TOC FU Call Complete Date: 05/06/24 Patient's Name and Date of Birth confirmed.  Transition Care Management Follow-up Telephone Call Date of Discharge: 05/02/24 Discharge Facility: [facility]) Type of Discharge: Inpatient Admission Primary Inpatient Discharge Diagnosis:: Weakness, dyspnea How have you been since you were released from the hospital?: Better Any questions or concerns?: No  Items Reviewed: Did you receive and understand the discharge instructions provided?: Yes Medications obtained,verified, and reconciled?: Yes (Medications Reviewed) Any new allergies since your discharge?: No Dietary orders reviewed?: Yes Type of Diet Ordered:: Heart Healthy Do you have support at home?: Yes People in Home [RPT]: child(ren), adult Name of Support/Comfort Primary Source: Adult son/Jonathan Gonzalez  Medications Reviewed Today: Medications Reviewed Today     Reviewed by Lucky Andrea LABOR, RN (Registered Nurse) on 05/06/24 at 1343  Med List Status: <None>   Medication Order Taking? Sig Documenting Provider Last Dose Status Informant  albuterol  (VENTOLIN  HFA) 108 (90 Base) MCG/ACT inhaler [id number] Yes Inhale 1-2 puffs into the lungs every 6 (six) hours as needed for wheezing or shortness of breath. Lang Norleen POUR, PA-C  Active Self  amiodarone  (PACERONE ) 200 MG tablet [id number]  Take 1 tablet (200 mg total) by mouth daily.  Patient not taking: Reported on 05/06/2024   Lenetta No D, NP  Active Self  apixaban  (ELIQUIS ) 5 MG TABS tablet [id number] Yes Take 1 tablet (5 mg total) by mouth 2 (two) times daily. Bensimhon, Toribio SAUNDERS, MD  Active Self  ferrous sulfate  325 (65 FE) MG tablet [id number] Yes Take 1 tablet (325 mg total) by mouth daily with breakfast. Howell Lunger, DO  Active   furosemide  (LASIX ) 40 MG tablet [id number] Yes Take 1  tablet (40 mg total) by mouth every other day.  Patient taking differently: Take 40 mg by mouth daily as needed for edema (if wt goes over 5 pounds).   Lenetta No D, NP  Active Self  metFORMIN  (GLUCOPHAGE ) 500 MG tablet [id number] Yes Take 1 tablet (500 mg total) by mouth 2 (two) times daily with a meal.  Patient taking differently: Take 500 mg by mouth daily.   Theotis Haze ORN, NP  Active Self  metoprolol  succinate (TOPROL  XL) 50 MG 24 hr tablet [id number] Yes Take 1 tablet (50 mg total) by mouth daily. Take with or immediately following a meal. Howell Lunger, DO  Active   omeprazole  (PRILOSEC) 20 MG capsule [id number] Yes Take 1 capsule (20 mg total) by mouth daily. FOR ACID REFLUX Howell Lunger, DO  Active   sacubitril -valsartan  (ENTRESTO ) 24-26 MG [id number] Yes Take 1 tablet by mouth 2 (two) times daily. Berkley, [place], [place]  Active Self  spironolactone  (ALDACTONE ) 25 MG tablet [id number] Yes Take 0.5 tablets (12.5 mg total) by mouth daily. Lee, [place], NP  Active Self            Home Care and Equipment/Supplies: Were Home Health Services Ordered?: No Any new equipment or medical supplies ordered?: Yes Name of Medical supply agency?: [facility] Were you able to get the equipment/medical supplies?: Yes Do you have any questions related to the use of the equipment/supplies?: No  Functional Questionnaire: Do you need assistance with bathing/showering or dressing?: No Do you need assistance with meal preparation?: No Do you need assistance with eating?: No Do you have difficulty maintaining continence: No Do you need assistance with getting out  of bed/getting out of a chair/moving?: No Do you have difficulty managing or taking your medications?: Yes (Son assists with medications)  Follow up appointments reviewed: PCP Follow-up appointment confirmed?: Yes Date of PCP follow-up appointment?: 05/13/24 Follow-up Provider: Haze Servant, NP Specialist Hospital Follow-up appointment  confirmed?: NA Do you need transportation to your follow-up appointment?: No Do you understand care options if your condition(s) worsen?: Yes-patient verbalized understanding  SDOH Interventions Today    Flowsheet Row Most Recent Value  SDOH Interventions   Food Insecurity Interventions Intervention Not Indicated  Housing Interventions Intervention Not Indicated  Transportation Interventions Intervention Not Indicated  [Patient uses L[employer]]  Utilities Interventions Intervention Not Indicated    Goals Addressed             This Visit's Progress    VBCI Transitions of Care (TOC) Care Plan       Problems:  Recent Hospitalization for treatment of Anemia Medication access barrier difficulty obtaining medication refills due to cost of transportation  Goal:  Over the next 30 days, the patient will not experience hospital readmission  Interventions:  Transitions of Care: Doctor Visits  - discussed the importance of doctor visits Post discharge activity limitations prescribed by provider reviewed Medication review-discussed patient needing refills or close to needing refills on several medications, reviewed patient should be taking lasix  every other day Discussed the option for prescriptions to be delivered directly to patient, offered to assist with contacting [facility] to request delivery-patient prefers to call  Discussed the importance of daily weights, weighing same time of day and notifying provider with a 2-3 pound weight gain over night or 5 pound in one week  Patient Self Care Activities:  Attend all scheduled provider appointments Call pharmacy for medication refills 3-7 days in advance of running out of medications Call provider office for new concerns or questions  Notify RN Care Manager of TOC call rescheduling needs Participate in Transition of Care Program/Attend TOC scheduled calls Take medications as prescribed    Plan:  Telephone follow  up appointment with care management team member scheduled for:  05/14/24 at 1pm         Andrea Dimes RN, BSN [place]  [facility] RN Care Manager [phone]

## 2024-05-07 ENCOUNTER — Ambulatory Visit: Payer: Self-pay | Admitting: Gastroenterology

## 2024-05-12 ENCOUNTER — Telehealth: Payer: Self-pay | Admitting: Nurse Practitioner

## 2024-05-12 NOTE — Telephone Encounter (Signed)
 Pt unconfirmed appt 8/26 lvm

## 2024-05-13 ENCOUNTER — Encounter: Payer: Self-pay | Admitting: Nurse Practitioner

## 2024-05-13 ENCOUNTER — Other Ambulatory Visit: Payer: Self-pay

## 2024-05-13 ENCOUNTER — Ambulatory Visit: Attending: Nurse Practitioner | Admitting: Nurse Practitioner

## 2024-05-13 VITALS — BP 133/84 | HR 65 | Resp 20 | Ht 68.0 in | Wt 224.6 lb

## 2024-05-13 DIAGNOSIS — I480 Paroxysmal atrial fibrillation: Secondary | ICD-10-CM

## 2024-05-13 DIAGNOSIS — D649 Anemia, unspecified: Secondary | ICD-10-CM

## 2024-05-13 DIAGNOSIS — H538 Other visual disturbances: Secondary | ICD-10-CM | POA: Diagnosis not present

## 2024-05-13 DIAGNOSIS — Z09 Encounter for follow-up examination after completed treatment for conditions other than malignant neoplasm: Secondary | ICD-10-CM

## 2024-05-13 DIAGNOSIS — E119 Type 2 diabetes mellitus without complications: Secondary | ICD-10-CM

## 2024-05-13 DIAGNOSIS — N400 Enlarged prostate without lower urinary tract symptoms: Secondary | ICD-10-CM

## 2024-05-13 DIAGNOSIS — K219 Gastro-esophageal reflux disease without esophagitis: Secondary | ICD-10-CM

## 2024-05-13 DIAGNOSIS — Z7984 Long term (current) use of oral hypoglycemic drugs: Secondary | ICD-10-CM

## 2024-05-13 DIAGNOSIS — I10 Essential (primary) hypertension: Secondary | ICD-10-CM

## 2024-05-13 DIAGNOSIS — I5020 Unspecified systolic (congestive) heart failure: Secondary | ICD-10-CM

## 2024-05-13 MED ORDER — OMEPRAZOLE 20 MG PO CPDR
20.0000 mg | DELAYED_RELEASE_CAPSULE | Freq: Every day | ORAL | 0 refills | Status: DC
  Filled 2024-05-13: qty 90, 90d supply, fill #0

## 2024-05-13 MED ORDER — SACUBITRIL-VALSARTAN 24-26 MG PO TABS
1.0000 | ORAL_TABLET | Freq: Two times a day (BID) | ORAL | 0 refills | Status: DC
  Filled 2024-05-13: qty 60, 30d supply, fill #0

## 2024-05-13 MED ORDER — SPIRONOLACTONE 25 MG PO TABS
12.5000 mg | ORAL_TABLET | Freq: Every day | ORAL | 0 refills | Status: AC
  Filled 2024-05-13: qty 45, 90d supply, fill #0

## 2024-05-13 MED ORDER — METOPROLOL SUCCINATE ER 50 MG PO TB24
50.0000 mg | ORAL_TABLET | Freq: Every day | ORAL | 0 refills | Status: DC
  Filled 2024-05-13: qty 90, 90d supply, fill #0

## 2024-05-13 MED ORDER — METFORMIN HCL 500 MG PO TABS
500.0000 mg | ORAL_TABLET | Freq: Every day | ORAL | 1 refills | Status: AC
  Filled 2024-05-13: qty 90, 90d supply, fill #0
  Filled 2024-09-07 – 2024-09-30 (×2): qty 90, 90d supply, fill #1

## 2024-05-13 MED ORDER — AMIODARONE HCL 200 MG PO TABS
200.0000 mg | ORAL_TABLET | Freq: Every day | ORAL | 5 refills | Status: DC
  Filled 2024-05-13: qty 90, 90d supply, fill #0

## 2024-05-13 MED ORDER — TAMSULOSIN HCL 0.4 MG PO CAPS
0.4000 mg | ORAL_CAPSULE | Freq: Every day | ORAL | 0 refills | Status: AC
  Filled 2024-05-13: qty 90, 90d supply, fill #0

## 2024-05-13 MED ORDER — APIXABAN 5 MG PO TABS
5.0000 mg | ORAL_TABLET | Freq: Two times a day (BID) | ORAL | 0 refills | Status: DC
  Filled 2024-05-13: qty 60, 30d supply, fill #0
  Filled ????-??-??: fill #1

## 2024-05-13 MED ORDER — FERROUS SULFATE 325 (65 FE) MG PO TABS
325.0000 mg | ORAL_TABLET | Freq: Every day | ORAL | 0 refills | Status: AC
  Filled 2024-05-13: qty 90, 90d supply, fill #0

## 2024-05-13 MED ORDER — FUROSEMIDE 40 MG PO TABS
40.0000 mg | ORAL_TABLET | ORAL | 0 refills | Status: AC
  Filled 2024-05-13: qty 45, 90d supply, fill #0

## 2024-05-13 NOTE — Progress Notes (Signed)
 Patient would like to know where the bleeding was coming from that had him in need of a transfusion.

## 2024-05-13 NOTE — Progress Notes (Signed)
 Assessment & Plan:  Jonathan Gonzalez was seen today for hospitalization follow-up.  Diagnoses and all orders for this visit:  Symptomatic anemia -     Ambulatory referral to Hematology / Oncology -     ferrous sulfate  325 (65 FE) MG tablet; Take 1 tablet (325 mg total) by mouth daily with breakfast. FOR ANEMIA Anemia with no identified bleeding source. Previous EGD and colonoscopy negative. Possible contribution to dyspnea and heart issues. - Refer to hematology for further evaluation. - Provide contact information for [facility] hematology. - Advise to contact hematology if no response in 7-10 days. - Continue iron  supplementation.   Hospital discharge follow-up  BPH without obstruction/lower urinary tract symptoms -     Ambulatory referral to Urology -     tamsulosin  (FLOMAX ) 0.4 MG CAPS capsule; Take 1 capsule (0.4 mg total) by mouth daily. FOR PROSTATE SYMPTOMS -     PSA Benign prostatic hyperplasia with lower urinary tract symptoms Symptoms suggestive of BPH. Previous PSA levels checked. Referral to urology needed. - Order PSA test. - Refer to urology for further evaluation. - Prescribe tamsulosin  (Flomax ).   Blurred vision, bilateral -     Ambulatory referral to Ophthalmology  Paroxysmal atrial fibrillation (HCC) -     amiodarone  (PACERONE ) 200 MG tablet; Take 1 tablet (200 mg total) by mouth daily. -     apixaban  (ELIQUIS ) 5 MG TABS tablet; Take 1 tablet (5 mg total) by mouth 2 (two) times daily. -     metoprolol  succinate (TOPROL  XL) 50 MG 24 hr tablet; Take 1 tablet (50 mg total) by mouth daily. Take with or immediately following a meal. Managed with amiodarone . Risk of exacerbation and thromboembolic events if medication not taken. - Instruct to contact pharmacy for mail delivery of amiodarone . - Continue Eliquis  for anticoagulation.   HFrEF  -     spironolactone  (ALDACTONE ) 25 MG tablet; Take 0.5 tablets (12.5 mg total) by mouth daily. -     sacubitril -valsartan   (ENTRESTO ) 24-26 MG; Take 1 tablet by mouth 2 (two) times daily. -     furosemide  (LASIX ) 40 MG tablet; Take 1 tablet (40 mg total) by mouth every other day. Intermittent dyspnea possibly related to anemia and heart failure. - Encourage use of walker for safety. - Ensure follow-up with hematology for anemia management Managed with furosemide , metoprolol , and Entresto . Intermittent dyspnea possibly related to anemia. - Continue current heart failure medications. - Take furosemide  every other day as per cardiologist's recommendation.  GERD without esophagitis -     omeprazole  (PRILOSEC) 20 MG capsule; Take 1 capsule (20 mg total) by mouth daily. FOR ACID REFLUX INSTRUCTIONS: Avoid GERD Triggers: acidic, spicy or fried foods, caffeine, coffee, sodas,  alcohol and chocolate.     Primary hypertension Blood pressure at goal -     sacubitril -valsartan  (ENTRESTO ) 24-26 MG; Take 1 tablet by mouth 2 (two) times daily. Continue all antihypertensives as prescribed.  Reminded to bring in blood pressure log for follow  up appointment.  RECOMMENDATIONS: DASH/Mediterranean Diets are healthier choices for HTN.    Diabetes mellitus treated with oral medication ( A1c at goal of prediabetes range -     metFORMIN  (GLUCOPHAGE ) 500 MG tablet; Take 1 tablet (500 mg total) by mouth daily. For DIABETES -     Hemoglobin A1c Lab Results  Component Value Date   HGBA1C 5.4 02/26/2024     Depression Ongoing depression, particularly after loss of wife. Emotional support from son is significant.  Hearing disturbance (tinnitus,  ear pressure) Reports of ear pressure and ringing. Possible sinus-related issues or tinnitus associated with anemia.  General Health Maintenance Overdue for eye examination, last seen over ten years ago. - Refer to eye doctor for routine examination.  Patient has been counseled on age-appropriate routine health concerns for screening and prevention. These are reviewed and up-to-date.  Referrals have been placed accordingly. Immunizations are up-to-date or declined.    Subjective:   Chief Complaint  Patient presents with   Hospitalization Follow-up    History of Present Illness Jonathan Gonzalez is a 63 year old male with anemia and heart fluttering who presents for a hospital follow-up.  He has a past medical history of A-fib, (successful DCCV) asthma, chronic systolic heart failure, PAF, NSVT, DM2, GERD, Hyperlipidemia, and Hypertension.    He missed his previous appointment for iron  infusion. Stating he is on a fixed income and cannot afford the payments for [place] lift to get to multiple appointments. Lives with his son (high functioning Autism) in a Motel. Trying to get disability.  He has symptomatic anemia including shortness of breath with minimal activity.    He was referred to urology for previously elevated PSA.  When he was contacted by urology he declined the schedule.  I did instruct him today that his anemia could be caused by prostate cancer and he declined urology referral again today.  He was prescribed antibiotic.  And repeat PSA normal.   HFU He is here for a follow-up after a recent hospitalization for anemia. He underwent an EGD and colonoscopy, which did not reveal any bleeding in the esophagus or colon. The source of bleeding was not identified, and he was instructed to follow up with a hematologist. He feels 'a whole lot better' but still has days where he feels unwell. He has been dealing with anemia since 2022 and has been referred to a hematologist previously but not follow-up for scheduling.  He is currently taking Eliquis  as a blood thinner. He experiences heart fluttering and is prescribed amiodarone  for this condition but has not been taking.  States he has run out of amiodarone  due to transportation issues.  I did instruct him that we have a mail order system so transportation is he has an upcoming cardiology appointment on the 11th.  He reports  urinary urgency with difficulty urinating including feeling of incomplete bladder emptying and hesitancy. He missed a previous urology appointment on June 2nd due to illness and dyspnea. He is currently getting up three to four times a night to urinate.  He experiences shortness of breath and weakness, which have led to the use of a walker. He finds the walker heavy and cumbersome, especially in cold weather.  He does not have it at this appointment today.  His son has been a significant support, especially after the loss of his wife, which led to a period of depression and non-compliance with medication.  He experiences occasional ringing and pressure in his ears. No allergies or sinus issues. He has a history of working around loud machinery, which may contribute to his ear symptoms.      Review of Systems  Constitutional:  Negative for fever, malaise/fatigue and weight loss.  HENT:  Positive for tinnitus. Negative for nosebleeds.   Eyes:  Positive for blurred vision. Negative for double vision and photophobia.  Respiratory:  Positive for shortness of breath. Negative for cough.   Cardiovascular:  Positive for palpitations. Negative for chest pain and leg swelling.  Gastrointestinal: Negative.  Negative for heartburn, nausea and vomiting.  Genitourinary:  Positive for frequency and urgency.  Musculoskeletal: Negative.  Negative for myalgias.  Neurological:  Positive for weakness. Negative for dizziness, focal weakness, seizures and headaches.  Psychiatric/Behavioral: Negative.  Negative for suicidal ideas.     Past Medical History:  Diagnosis Date   A-fib (HCC)    Anemia    Asthma    Blood transfusion without reported diagnosis 04/30/2024   Diabetes mellitus without complication (HCC)    GERD (gastroesophageal reflux disease)    Hyperlipidemia    Hypertension     Past Surgical History:  Procedure Laterality Date   BONE BIOPSY  05/02/2024   Procedure: BIOPSY, GI;  Surgeon: Charlanne Groom, MD;  Location[facility] ENDOSCOPY;  Service: Gastroenterology;;   VASSIE STUDY  12/06/2022   Procedure: BUBBLE STUDY;  Surgeon: Loni Soyla LABOR, MD;  Location: [facility] ENDOSCOPY;  Service: Cardiovascular;;   CARDIOVERSION N/A 12/06/2022   Procedure: CARDIOVERSION;  Surgeon: Loni Soyla LABOR, MD;  Location: [facility] ENDOSCOPY;  Service: Cardiovascular;  Laterality: N/A;   CARDIOVERSION N/A 01/15/2023   Procedure: CARDIOVERSION;  Surgeon: Cherrie Toribio SAUNDERS, MD;  Location: [facility] INVASIVE CV LAB;  Service: Cardiovascular;  Laterality: N/A;   CARDIOVERSION N/A 02/25/2023   Procedure: CARDIOVERSION;  Surgeon: Cherrie Toribio SAUNDERS, MD;  Location: [facility] INVASIVE CV LAB;  Service: Cardiovascular;  Laterality: N/A;   COLONOSCOPY N/A 05/02/2024   Procedure: COLONOSCOPY;  Surgeon: Charlanne Groom, MD;  Location: [facility] ENDOSCOPY;  Service: Gastroenterology;  Laterality: N/A;   ESOPHAGOGASTRODUODENOSCOPY N/A 05/02/2024   Procedure: EGD (ESOPHAGOGASTRODUODENOSCOPY);  Surgeon: Charlanne Groom, MD;  Location: [facility] ENDOSCOPY;  Service: Gastroenterology;  Laterality: N/A;   HERNIA REPAIR     POLYPECTOMY  05/02/2024   Procedure: POLYPECTOMY, INTESTINE;  Surgeon: Charlanne Groom, MD;  Location: [facility] ENDOSCOPY;  Service: Gastroenterology;;   TEE WITHOUT CARDIOVERSION N/A 12/06/2022   Procedure: TRANSESOPHAGEAL ECHOCARDIOGRAM (TEE);  Surgeon: Loni Soyla LABOR, MD;  Location: [facility] ENDOSCOPY;  Service: Cardiovascular;  Laterality: N/A;   TEE WITHOUT CARDIOVERSION N/A 01/15/2023   Procedure: TRANSESOPHAGEAL ECHOCARDIOGRAM;  Surgeon: Cherrie Toribio SAUNDERS, MD;  Location: [facility] INVASIVE CV LAB;  Service: Cardiovascular;  Laterality: N/A;    No family history on file.  Social History Reviewed with no changes to be made today.   Outpatient Medications Prior to Visit  Medication Sig Dispense Refill   albuterol  (VENTOLIN  HFA) 108 (90 Base) MCG/ACT inhaler Inhale 1-2 puffs into the lungs every 6 (six) hours as needed for wheezing or shortness of breath. 18  g 2   amiodarone  (PACERONE ) 200 MG tablet Take 1 tablet (200 mg total) by mouth daily. 30 tablet 5   apixaban  (ELIQUIS ) 5 MG TABS tablet Take 1 tablet (5 mg total) by mouth 2 (two) times daily. 60 tablet 11   ferrous sulfate  325 (65 FE) MG tablet Take 1 tablet (325 mg total) by mouth daily with breakfast. 30 tablet 0   furosemide  (LASIX ) 40 MG tablet Take 1 tablet (40 mg total) by mouth every other day. (Patient taking differently: Take 40 mg by mouth daily as needed for edema (if wt goes over 5 pounds).)     metFORMIN  (GLUCOPHAGE ) 500 MG tablet Take 1 tablet (500 mg total) by mouth 2 (two) times daily with a meal. (Patient taking differently: Take 500 mg by mouth daily.) 60 tablet 4   metoprolol  succinate (TOPROL  XL) 50 MG 24 hr tablet Take 1 tablet (50 mg total) by mouth daily. Take with or immediately following a meal. 30 tablet  0   sacubitril -valsartan  (ENTRESTO ) 24-26 MG Take 1 tablet by mouth 2 (two) times daily. 60 tablet 11   omeprazole  (PRILOSEC) 20 MG capsule Take 1 capsule (20 mg total) by mouth daily. FOR ACID REFLUX (Patient not taking: Reported on 05/13/2024) 30 capsule 0   spironolactone  (ALDACTONE ) 25 MG tablet Take 0.5 tablets (12.5 mg total) by mouth daily. 45 tablet 3   No facility-administered medications prior to visit.    No Known Allergies     Objective:    BP 133/84 (BP Location: Left Arm, Patient Position: Sitting, Cuff Size: Normal)   Pulse 65   Resp 20   Ht 5' 8 (1.727 m)   Wt 224 lb 9.6 oz (101.9 kg)   SpO2 99%   BMI 34.15 kg/m  Wt Readings from Last 3 Encounters:  05/13/24 224 lb 9.6 oz (101.9 kg)  05/06/24 222 lb (100.7 kg)  04/30/24 220 lb (99.8 kg)    Physical Exam Vitals and nursing note reviewed.  Constitutional:      Appearance: He is well-developed.  HENT:     Head: Normocephalic and atraumatic.  Cardiovascular:     Rate and Rhythm: Normal rate and regular rhythm.     Heart sounds: Normal heart sounds. No murmur heard.    No friction rub.  No gallop.  Pulmonary:     Effort: Pulmonary effort is normal. No tachypnea or respiratory distress.     Breath sounds: Normal breath sounds. No decreased breath sounds, wheezing, rhonchi or rales.  Chest:     Chest wall: No tenderness.  Abdominal:     General: Bowel sounds are normal.     Palpations: Abdomen is soft.  Musculoskeletal:        General: Normal range of motion.     Cervical back: Normal range of motion.  Skin:    General: Skin is warm and dry.  Neurological:     Mental Status: He is alert and oriented to person, place, and time.     Coordination: Coordination normal.  Psychiatric:        Behavior: Behavior normal. Behavior is cooperative.        Thought Content: Thought content normal.        Judgment: Judgment normal.          Patient has been counseled extensively about nutrition and exercise as well as the importance of adherence with medications and regular follow-up. The patient was given clear instructions to go to ER or return to medical center if symptoms don't improve, worsen or new problems develop. The patient verbalized understanding.   Follow-up: Return in about 3 months (around 08/13/2024).   Haze LELON Servant, FNP-BC [facility] [place], [place] [phone]   05/13/2024, 12:54 PM

## 2024-05-13 NOTE — Patient Instructions (Addendum)
 [facility] Address: [address] [place],  [place], [place] [address] Phone: [phone]    [facility] Address: [address] [place], [place], [place] [address] Phone: [phone]

## 2024-05-14 ENCOUNTER — Other Ambulatory Visit: Payer: Self-pay | Admitting: *Deleted

## 2024-05-14 DIAGNOSIS — F4321 Adjustment disorder with depressed mood: Secondary | ICD-10-CM

## 2024-05-14 LAB — HEMOGLOBIN A1C
Est. average glucose Bld gHb Est-mCnc: 114 mg/dL
Hgb A1c MFr Bld: 5.6 % (ref 4.8–5.6)

## 2024-05-14 LAB — PSA: Prostate Specific Ag, Serum: 1 ng/mL (ref 0.0–4.0)

## 2024-05-14 NOTE — Transitions of Care (Post Inpatient/ED Visit) (Signed)
 Transition of Care week 2  Visit Note  05/14/2024  Name: Jonathan Gonzalez MRN: [id number]          DOB: [birth year]  Situation: Patient enrolled in [facility] 30-day program. Visit completed with [name] by telephone.   Background:   Initial Transition Care Management Follow-up Telephone Call    Past Medical History:  Diagnosis Date   A-fib (HCC)    Anemia    Asthma    Blood transfusion without reported diagnosis 04/30/2024   Diabetes mellitus without complication (HCC)    GERD (gastroesophageal reflux disease)    Hyperlipidemia    Hypertension     Assessment: Patient Reported Symptoms: Cognitive Cognitive Status: Able to follow simple commands, Alert and oriented to person, place, and time, Normal speech and language skills      Neurological Neurological Review of Symptoms: No symptoms reported    HEENT HEENT Symptoms Reported: No symptoms reported      Cardiovascular Cardiovascular Symptoms Reported: No symptoms reported Does patient have uncontrolled Hypertension?: No Cardiovascular Management Strategies: Routine screening, Adequate rest, Medication therapy, Diet modification Cardiovascular Comment: Patient has not checked weight today. Reports SOB last night, denies today.  Respiratory Respiratory Symptoms Reported: Shortness of breath Other Respiratory Symptoms: Patient reports SOB last night, denies today. Respiratory Management Strategies: Routine screening, Diet modification, Adequate rest, Medication therapy Respiratory Self-Management Outcome: 3 (uncertain)  Endocrine Endocrine Symptoms Reported: No symptoms reported Is patient diabetic?: Yes Is patient checking blood sugars at home?: No Endocrine Self-Management Outcome: 4 (good) Endocrine Comment: Recent A1C was 5.6 on 05/13/24  Gastrointestinal Gastrointestinal Symptoms Reported: No symptoms reported      Genitourinary Genitourinary Symptoms Reported: Difficulty initiating stream Genitourinary  Self-Management Outcome: 3 (uncertain) Genitourinary Comment: Discussed difficulty urinating with PCP yesterday. Started on Flomax , PSA drawn  Integumentary Integumentary Symptoms Reported: No symptoms reported    Musculoskeletal Musculoskelatal Symptoms Reviewed: No symptoms reported        Psychosocial Psychosocial Symptoms Reported: Other Additional Psychological Details: Patient reports difficulty sleeping since the death of his wife. Referral to [facility]BCI LCSW placed   Major Change/Loss/Stressor/Fears (CP): Death of a loved one     There were no vitals filed for this visit.  Medications Reviewed Today     Reviewed by Lucky Andrea LABOR, RN (Registered Nurse) on 05/14/24 at 1316  Med List Status: <None>   Medication Order Taking? Sig Documenting Provider Last Dose Status Informant  albuterol  (VENTOLIN  HFA) 108 (90 Base) MCG/ACT inhaler [id number] Yes Inhale 1-2 puffs into the lungs every 6 (six) hours as needed for wheezing or shortness of breath. Lang Norleen POUR, PA-C  Active Self  amiodarone  (PACERONE ) 200 MG tablet [id number] Yes Take 1 tablet (200 mg total) by mouth daily. Theotis Haze ORN, NP  Active   apixaban  (ELIQUIS ) 5 MG TABS tablet [id number] Yes Take 1 tablet (5 mg total) by mouth 2 (two) times daily. Theotis Haze ORN, NP  Active   ferrous sulfate  325 (65 FE) MG tablet [id number] Yes Take 1 tablet (325 mg total) by mouth daily with breakfast. FOR ANEMIA Fleming, Zelda W, NP  Active   furosemide  (LASIX ) 40 MG tablet [id number] Yes Take 1 tablet (40 mg total) by mouth every other day. Theotis Haze ORN, NP  Active   metFORMIN  (GLUCOPHAGE ) 500 MG tablet [id number] Yes Take 1 tablet (500 mg total) by mouth daily. For DIABETES Fleming, Zelda W, NP  Active   metoprolol  succinate (TOPROL  XL) 50 MG 24 hr tablet [id number] Yes Take 1 tablet (  50 mg total) by mouth daily. Take with or immediately following a meal. Fleming, Zelda W, NP  Active   omeprazole  (PRILOSEC) 20 MG capsule [id number] Yes Take  1 capsule (20 mg total) by mouth daily. FOR ACID REFLUX Theotis Haze ORN, NP  Active   sacubitril -valsartan  (ENTRESTO ) 24-26 MG [id number] Yes Take 1 tablet by mouth 2 (two) times daily. Fleming, Zelda W, NP  Active   spironolactone  (ALDACTONE ) 25 MG tablet [id number] Yes Take 0.5 tablets (12.5 mg total) by mouth daily. Theotis Haze ORN, NP  Active   tamsulosin  (FLOMAX ) 0.4 MG CAPS capsule [id number] Yes Take 1 capsule (0.4 mg total) by mouth daily. FOR PROSTATE SYMPTOMS Fleming, Zelda W, NP  Active             Recommendation:   Continue Current Plan of Care  Follow Up Plan:   Telephone follow-up in 1 week  Andrea Dimes RN, BSN [place]  [facility] Health RN Care Manager [phone]

## 2024-05-14 NOTE — Patient Instructions (Signed)
 Visit Information  Thank you for taking time to visit with me today. Please don't hesitate to contact me if I can be of assistance to you before our next scheduled telephone appointment.   Following is a copy of your care plan:   Goals Addressed             This Visit's Progress    [facility]BC[facility] Transitions of Care (TOC) Care Plan       Problems:  Recent Hospitalization for treatment of Anemia Medication access barrier difficulty obtaining medication refills due to cost of transportation  Goal:  Over the next 30 days, the patient will not experience hospital readmission  Interventions:  Transitions of Care: Doctor Visits  - discussed the importance of doctor visits Post discharge activity limitations prescribed by provider reviewed Medication review-patient reports having all medications and taking as instructed Discussed the option for prescriptions to be delivered directly to patient, offered to assist with contacting [facility] to request delivery-patient prefers to call  Discussed the importance of daily weights, weighing same time of day and notifying provider with a 2-3 pound weight gain over night or 5 pound in one week-revisited Referral to VBCI LCSW for managing grief Provided information for medical transportation provided by [employer]-patient has the information on his insurance card and will call to schedule transportation to upcoming appointments Reviewed upcoming appointments including: Cardiology on 05/28/24 and Hematology on 06/04/24  Patient Self Care Activities:  Attend all scheduled provider appointments Call pharmacy for medication refills 3-7 days in advance of running out of medications Call provider office for new concerns or questions  Notify RN Care Manager of [facility] call rescheduling needs Participate in Transition of Care Program/Attend TOC scheduled calls Take medications as prescribed    Plan:  Telephone follow up appointment with  care management team member scheduled for:  05/21/24 at 2:15pm        Patient verbalizes understanding of instructions and care plan provided today and agrees to view in MyChart. Active MyChart status and patient understanding of how to access instructions and care plan via MyChart confirmed with patient.     Telephone follow up appointment with care management team member scheduled for:05/21/24 at 2:15pm  Please call the care guide team at [phone] if you need to cancel or reschedule your appointment.   Please call [phone]-TALK (toll free, 24 hour hotline) go to [facility]gent [facility] [address] [place] [phone]) call 911 if you are experiencing a Mental Health or Behavioral Health Crisis or need someone to talk to.  Andrea Dimes RN, BSN [place]  [facility] Health RN Care Manager [phone]

## 2024-05-21 ENCOUNTER — Other Ambulatory Visit: Payer: Self-pay | Admitting: *Deleted

## 2024-05-21 NOTE — Transitions of Care (Post Inpatient/ED Visit) (Signed)
 Transition of Care week 3  Visit Note  05/21/2024  Name: Jonathan Gonzalez MRN: [id number]          DOB: 1961/07/08  Situation: Patient enrolled in [facility] 30-day program. Visit completed with Jonathan Gonzalez by telephone.   Background:   Initial Transition Care Management Follow-up Telephone Call    Past Medical History:  Diagnosis Date   A-fib (HCC)    Anemia    Asthma    Blood transfusion without reported diagnosis 04/30/2024   Diabetes mellitus without complication (HCC)    GERD (gastroesophageal reflux disease)    Hyperlipidemia    Hypertension     Assessment: Patient Reported Symptoms: Cognitive Cognitive Status: Able to follow simple commands, Alert and oriented to person, place, and time, Normal speech and language skills   Healing Pattern: Average  Neurological Neurological Review of Symptoms: No symptoms reported    HEENT HEENT Symptoms Reported: No symptoms reported      Cardiovascular Cardiovascular Symptoms Reported: No symptoms reported    Respiratory Respiratory Symptoms Reported: Shortness of breath Other Respiratory Symptoms: SOB yesterday, denies today Respiratory Management Strategies: Routine screening, Medication therapy, Adequate rest Respiratory Self-Management Outcome: 4 (good)  Endocrine Endocrine Symptoms Reported: No symptoms reported Endocrine Self-Management Outcome: 4 (good)  Gastrointestinal Gastrointestinal Symptoms Reported: Diarrhea Additional Gastrointestinal Details: diarrhea yesterday, denies today Gastrointestinal Management Strategies: Adequate rest Gastrointestinal Self-Management Outcome: 3 (uncertain) Gastrointestinal Comment: Patient feels diarrhea was related to taking metformin  without eating a meal. Advised patient to take metformin  with a meal. Discussed can take with lunch if this is his first meal of the day.    Genitourinary Genitourinary Symptoms Reported: No symptoms reported Genitourinary Self-Management Outcome: 4 (good)   Integumentary Integumentary Symptoms Reported: No symptoms reported    Musculoskeletal Musculoskelatal Symptoms Reviewed: Limited mobility Additional Musculoskeletal Details: ambulates with rollator   Falls in the past year?: Yes Number of falls in past year: 1 or less Was there an injury with Fall?: No Fall Risk Category Calculator: 1 Patient Fall Risk Level: Low Fall Risk Patient at Risk for Falls Due to: Impaired mobility Fall risk Follow up: Education provided, Falls prevention discussed  Psychosocial Psychosocial Symptoms Reported: Not assessed         There were no vitals filed for this visit.  Medications Reviewed Today     Reviewed by Lucky Andrea LABOR, RN (Registered Nurse) on 05/21/24 at 1435  Med List Status: <None>   Medication Order Taking? Sig Documenting Provider Last Dose Status Informant  albuterol  (VENTOLIN  HFA) 108 (90 Base) MCG/ACT inhaler [id number]  Inhale 1-2 puffs into the lungs every 6 (six) hours as needed for wheezing or shortness of breath. Lang Norleen POUR, PA-C  Active Self  amiodarone  (PACERONE ) 200 MG tablet [id number] Yes Take 1 tablet (200 mg total) by mouth daily. Theotis Haze ORN, NP  Active   apixaban  (ELIQUIS ) 5 MG TABS tablet [id number] Yes Take 1 tablet (5 mg total) by mouth 2 (two) times daily. Theotis Haze ORN, NP  Active   ferrous sulfate  325 (65 FE) MG tablet [id number] Yes Take 1 tablet (325 mg total) by mouth daily with breakfast. FOR ANEMIA Fleming, Zelda W, NP  Active   furosemide  (LASIX ) 40 MG tablet [id number] Yes Take 1 tablet (40 mg total) by mouth every other day. Theotis Haze ORN, NP  Active   metFORMIN  (GLUCOPHAGE ) 500 MG tablet [id number] Yes Take 1 tablet (500 mg total) by mouth daily. For DIABETES Theotis Haze ORN, NP  Active   metoprolol   succinate (TOPROL  XL) 50 MG 24 hr tablet [id number] Yes Take 1 tablet (50 mg total) by mouth daily. Take with or immediately following a meal. Fleming, Zelda W, NP  Active   omeprazole  (PRILOSEC) 20  MG capsule [id number] Yes Take 1 capsule (20 mg total) by mouth daily. FOR ACID REFLUX  Patient taking differently: Take 20 mg by mouth daily as needed. FOR ACID REFLUX   Theotis Haze ORN, NP  Active   sacubitril -valsartan  (ENTRESTO ) 24-26 MG [id number] Yes Take 1 tablet by mouth 2 (two) times daily. Fleming, Zelda W, NP  Active   spironolactone  (ALDACTONE ) 25 MG tablet [id number] Yes Take 0.5 tablets (12.5 mg total) by mouth daily. Theotis Haze ORN, NP  Active   tamsulosin  (FLOMAX ) 0.4 MG CAPS capsule [id number] Yes Take 1 capsule (0.4 mg total) by mouth daily. FOR PROSTATE SYMPTOMS Fleming, Zelda W, NP  Active             Recommendation:   Continue Current Plan of Care  Follow Up Plan:   Telephone follow-up in 1 week  Andrea Dimes RN, BSN [place]  Value-Based Care Institute [facility] RN Care Manager [phone]

## 2024-05-21 NOTE — Patient Instructions (Signed)
 Visit Information  Thank you for taking time to visit with me today. Please don't hesitate to contact me if I can be of assistance to you before our next scheduled telephone appointment.   Following is a copy of your care plan:   Goals Addressed             This Visit's Progress    VBCI Transitions of Care (TOC) Care Plan       Problems:  Recent Hospitalization for treatment of Anemia Medication access barrier difficulty obtaining medication refills due to cost of transportation  Goal:  Over the next 30 days, the patient will not experience hospital readmission  Interventions:  Transitions of Care: Doctor Visits  - discussed the importance of doctor visits Post discharge activity limitations prescribed by provider reviewed Medication review-patient reports having all medications and taking as instructed Discussed the option for prescriptions to be delivered directly to patient, offered to assist with contacting [facility] to request delivery-patient prefers to call  Discussed the importance of daily weights, weighing same time of day and notifying provider with a 2-3 pound weight gain over night or 5 pound in one week-revisited Referral to VBCI LCSW for managing grief Provided information for medical transportation provided by [employer]-patient has the information on his insurance card and will call to schedule transportation to upcoming appointments Reviewed upcoming appointments including: Cardiology on 05/28/24 and Hematology on 06/04/24  Patient Self Care Activities:  Attend all scheduled provider appointments Call pharmacy for medication refills 3-7 days in advance of running out of medications Call provider office for new concerns or questions  Notify RN Care Manager of [facility] call rescheduling needs Participate in Transition of Care Program/Attend TOC scheduled calls Take medications as prescribed    Plan:  Telephone follow up appointment with  care management team member scheduled for:  05/27/24 at 1:30pm        Patient verbalizes understanding of instructions and care plan provided today and agrees to view in MyChart. Active MyChart status and patient understanding of how to access instructions and care plan via MyChart confirmed with patient.     Telephone follow up appointment with care management team member scheduled for:05/27/24 at 1:30pm  Please call the care guide team at [phone] if you need to cancel or reschedule your appointment.   Please call 1-800-273-TALK (toll free, 24 hour hotline) go to [facility] Urgent [facility] [address], [place] [phone]) call 911 if you are experiencing a Mental Health or Behavioral Health Crisis or need someone to talk to.  Andrea Dimes RN, BSN [place]  Value-Based Care Institute [facility] RN Care Manager [phone]

## 2024-05-22 ENCOUNTER — Telehealth: Payer: Self-pay

## 2024-05-22 NOTE — Progress Notes (Signed)
 Complex Care Management Note  Care Guide Note 05/22/2024 Name: Jonathan Gonzalez MRN: [id number] DOB: 07-Aug-1961  Jonathan Gonzalez is a 63 y.o. year old male who sees Theotis Haze ORN, NP for primary care. I reached out to [name] by phone today to offer complex care management services.  [name] was given information about Complex Care Management services today including:   The Complex Care Management services include support from the care team which includes your Nurse Care Manager, Clinical Social Worker, or Pharmacist.  The Complex Care Management team is here to help remove barriers to the health concerns and goals most important to you. Complex Care Management services are voluntary, and the patient may decline or stop services at any time by request to their care team member.   Complex Care Management Consent Status: Patient agreed to services and verbal consent obtained.   Follow up plan:  Telephone appointment with complex care management team member scheduled for:  06/04/2024  Encounter Outcome:  Patient Scheduled  Jeoffrey Buffalo , RMA     [place]  [facility], [facility] Guide  Direct Dial: [phone]  Website: [email or web address]

## 2024-05-24 ENCOUNTER — Ambulatory Visit: Payer: Self-pay | Admitting: Nurse Practitioner

## 2024-05-27 ENCOUNTER — Other Ambulatory Visit: Payer: Self-pay | Admitting: *Deleted

## 2024-05-27 NOTE — Progress Notes (Unsigned)
  Electrophysiology Office Follow up Visit Note:    Date:  05/28/2024   ID:  [name], DOB December 20, 1960, MRN [id number]  PCP:  Theotis Haze ORN, NP  [facility] Cardiologist:  Oneil Parchment, MD  [facility] Electrophysiologist:  OLE ONEIDA HOLTS, MD    Interval History:     Jonathan Gonzalez is a 63 y.o. male who presents for a follow up visit.    I last saw the patient April 30, 2024.  At the last appointment I referred him to the emergency department given concern of symptomatic anemia.  When he arrived his hemoglobin was less than 7.  He is with his son today in clinic.  He reports he is having some wheezing today.  He thinks he has some additional fluid on board given a weight gain of a few pounds on our scale.  He thinks the bleeding has stopped.  He has an appointment next week with hematology at [facility].       Past medical, surgical, social and family history were reviewed.  ROS:   Please see the history of present illness.    All other systems reviewed and are negative.  EKGs/Labs/Other Studies Reviewed:    The following studies were reviewed today:          Physical Exam:    VS:  BP 118/72 (BP Location: Left Arm, Patient Position: Sitting, Cuff Size: Large)   Pulse 74   Ht 5' 8 (1.727 m)   Wt 226 lb (102.5 kg)   SpO2 97%   BMI 34.36 kg/m     Wt Readings from Last 3 Encounters:  05/28/24 226 lb (102.5 kg)  05/27/24 220 lb (99.8 kg)  05/13/24 224 lb 9.6 oz (101.9 kg)     GEN: no distress CARD: RRR, No MRG.  No lower extremity edema RESP: No IWOB. Some expiratory wheezing.  Good aeration to both bases      ASSESSMENT:    1. Persistent atrial fibrillation (HCC)   2. Encounter for long-term (current) use of high-risk medication    PLAN:    In order of problems listed above:  #Persistent atrial fibrillation #High risk drug monitoring-amiodarone  Rhythm control indicated given past association of his atrial fibrillation  with a reduced ejection fraction Continue amiodarone  Continue Eliquis   #History of GI bleeding Tolerating Eliquis .  Follow-up with GI  #Chronic systolic heart failure I suspect he is mildly volume overloaded.  I will have him increase the Lasix  to twice daily for the next 3 days and then return to once daily dosing.  Follow-up with the EP in 6 months.  Will need repeat blood work at that time.   Signed, OLE HOLTS, MD, [facility] 05/28/2024 9:42 AM    Electrophysiology [facility]

## 2024-05-27 NOTE — Patient Instructions (Signed)
 Visit Information  Thank you for taking time to visit with me today. Please don't hesitate to contact me if I can be of assistance to you before our next scheduled telephone appointment.   Following is a copy of your care plan:   Goals Addressed             This Visit's Progress    VBCI Transitions of Care (TOC) Care Plan       Problems:  Recent Hospitalization for treatment of Anemia Medication access barrier difficulty obtaining medication refills due to cost of transportation  Goal:  Over the next 30 days, the patient will not experience hospital readmission  Interventions:  Transitions of Care: Doctor Visits  - discussed the importance of doctor visits Post discharge activity limitations prescribed by provider reviewed Medication review-patient reports having all medications and taking as instructed Discussed the option for prescriptions to be delivered directly to patient, offered to assist with contacting [facility] to request delivery-revisited, patient prefers to call today Discussed the importance of daily weights, weighing same time of day and notifying provider with a 2-3 pound weight gain over night or 5 pound in one week-revisited Provided information for medical transportation provided by [employer]-patient has the information on his insurance card and will call to schedule transportation to upcoming appointments-revisited, patient plans to use for appointment on 06/04/24 Reviewed upcoming appointments including: Cardiology on 05/28/24, LCSW telephone visit on 06/04/24 and Hematology on 06/04/24 Secure communication to PCP requesting a referral to GI   Patient Self Care Activities:  Attend all scheduled provider appointments Call pharmacy for medication refills 3-7 days in advance of running out of medications Call provider office for new concerns or questions  Notify RN Care Manager of [facility] call rescheduling needs Participate in Transition  of Care Program/Attend TOC scheduled calls Take medications as prescribed    Plan:  Telephone follow up appointment with care management team member scheduled for:  06/03/24 at 12:45pm        Patient verbalizes understanding of instructions and care plan provided today and agrees to view in MyChart. Active MyChart status and patient understanding of how to access instructions and care plan via MyChart confirmed with patient.     Telephone follow up appointment with care management team member scheduled for:06/03/24 at 12:45pm  Please call the care guide team at [phone] if you need to cancel or reschedule your appointment.   Please call 1-800-273-TALK (toll free, 24 hour hotline) go to [facility] Urgent [facility] [address] [place] [phone]) call 911 if you are experiencing a Mental Health or Behavioral Health Crisis or need someone to talk to.  Andrea Dimes RN, BSN [place]  Value-Based Care Institute [facility] RN Care Manager [phone]

## 2024-05-27 NOTE — Transitions of Care (Post Inpatient/ED Visit) (Signed)
 Transition of Care week 4  Visit Note  05/27/2024  Name: Jonathan Gonzalez MRN: [id number]          DOB: [birth year]  Situation: Patient enrolled in [facility] 30-day program. Visit completed with Mr. Mabry by telephone.   Background:   Initial Transition Care Management Follow-up Telephone Call    Past Medical History:  Diagnosis Date   A-fib (HCC)    Anemia    Asthma    Blood transfusion without reported diagnosis 04/30/2024   Diabetes mellitus without complication (HCC)    GERD (gastroesophageal reflux disease)    Hyperlipidemia    Hypertension     Assessment: Patient Reported Symptoms: Cognitive Cognitive Status: Able to follow simple commands, Alert and oriented to person, place, and time, Normal speech and language skills      Neurological Neurological Review of Symptoms: No symptoms reported    HEENT HEENT Symptoms Reported: No symptoms reported      Cardiovascular Cardiovascular Symptoms Reported: Chest pain or discomfort Cardiovascular Management Strategies: Adequate rest, Medication therapy, Routine screening Weight: 220 lb (99.8 kg) Cardiovascular Self-Management Outcome: 3 (uncertain) Cardiovascular Comment: Patient had an episode of chest pain this morning that resolved with rest. Follow up with Dr. Cindie, Cardiology on 05/28/24. Advised patient to seek medical care if the chest pain returns.  Respiratory Respiratory Symptoms Reported: Shortness of breath Other Respiratory Symptoms: SOB today with climbing stairs after grocery shopping. Feels like I am losing my strength Respiratory Self-Management Outcome: 3 (uncertain)  Endocrine Endocrine Symptoms Reported: No symptoms reported Endocrine Self-Management Outcome: 4 (good)  Gastrointestinal Additional Gastrointestinal Details: LBM yesterday, patient reports dark stool Gastrointestinal Self-Management Outcome: 3 (uncertain) Gastrointestinal Comment: Diarrhea improved    Genitourinary Genitourinary Symptoms  Reported: No symptoms reported    Integumentary Integumentary Symptoms Reported: No symptoms reported    Musculoskeletal Musculoskelatal Symptoms Reviewed: Limited mobility Additional Musculoskeletal Details: ambulates with rollator Musculoskeletal Self-Management Outcome: 4 (good)      Psychosocial Psychosocial Symptoms Reported: Not assessed         There were no vitals filed for this visit.  Medications Reviewed Today     Reviewed by Lucky Andrea LABOR, RN (Registered Nurse) on 05/27/24 at 1320  Med List Status: <None>   Medication Order Taking? Sig Documenting Provider Last Dose Status Informant  albuterol  (VENTOLIN  HFA) 108 (90 Base) MCG/ACT inhaler [id number] Yes Inhale 1-2 puffs into the lungs every 6 (six) hours as needed for wheezing or shortness of breath. Lang Norleen POUR, PA-C  Active Self  amiodarone  (PACERONE ) 200 MG tablet [id number] Yes Take 1 tablet (200 mg total) by mouth daily. Theotis Haze ORN, NP  Active   apixaban  (ELIQUIS ) 5 MG TABS tablet [id number] Yes Take 1 tablet (5 mg total) by mouth 2 (two) times daily. Theotis Haze ORN, NP  Active   ferrous sulfate  325 (65 FE) MG tablet [id number] Yes Take 1 tablet (325 mg total) by mouth daily with breakfast. FOR ANEMIA Fleming, Zelda W, NP  Active   furosemide  (LASIX ) 40 MG tablet [id number] Yes Take 1 tablet (40 mg total) by mouth every other day. Theotis Haze ORN, NP  Active   metFORMIN  (GLUCOPHAGE ) 500 MG tablet [id number] Yes Take 1 tablet (500 mg total) by mouth daily. For DIABETES Fleming, Zelda W, NP  Active   metoprolol  succinate (TOPROL  XL) 50 MG 24 hr tablet [id number] Yes Take 1 tablet (50 mg total) by mouth daily. Take with or immediately following a meal. Theotis Haze ORN, NP  Active  omeprazole  (PRILOSEC) 20 MG capsule [id number] Yes Take 1 capsule (20 mg total) by mouth daily. FOR ACID REFLUX  Patient taking differently: Take 20 mg by mouth daily as needed. FOR ACID REFLUX   Theotis Haze ORN, NP  Active    sacubitril -valsartan  (ENTRESTO ) 24-26 MG [id number] Yes Take 1 tablet by mouth 2 (two) times daily. Theotis Haze ORN, NP  Active   spironolactone  (ALDACTONE ) 25 MG tablet [id number] Yes Take 0.5 tablets (12.5 mg total) by mouth daily. Theotis Haze ORN, NP  Active   tamsulosin  (FLOMAX ) 0.4 MG CAPS capsule [id number] Yes Take 1 capsule (0.4 mg total) by mouth daily. FOR PROSTATE SYMPTOMS Fleming, Zelda W, NP  Active             Recommendation:   Continue Current Plan of Care  Follow Up Plan:   Telephone follow-up in 1 week  Andrea Dimes RN, BSN [place]  Value-Based Care Institute [facility] RN Care Manager [phone]

## 2024-05-28 ENCOUNTER — Other Ambulatory Visit: Payer: Self-pay | Admitting: Nurse Practitioner

## 2024-05-28 ENCOUNTER — Ambulatory Visit: Attending: Cardiology | Admitting: Cardiology

## 2024-05-28 ENCOUNTER — Encounter: Payer: Self-pay | Admitting: Cardiology

## 2024-05-28 VITALS — BP 118/72 | HR 74 | Ht 68.0 in | Wt 226.0 lb

## 2024-05-28 DIAGNOSIS — I5022 Chronic systolic (congestive) heart failure: Secondary | ICD-10-CM | POA: Diagnosis not present

## 2024-05-28 DIAGNOSIS — I4819 Other persistent atrial fibrillation: Secondary | ICD-10-CM | POA: Diagnosis present

## 2024-05-28 DIAGNOSIS — K449 Diaphragmatic hernia without obstruction or gangrene: Secondary | ICD-10-CM

## 2024-05-28 DIAGNOSIS — Z79899 Other long term (current) drug therapy: Secondary | ICD-10-CM | POA: Insufficient documentation

## 2024-05-28 NOTE — Patient Instructions (Signed)
 Medication Instructions:  Your physician has recommended you make the following change in your medication:  1) INCREASE Lasix  (furosemide ) to twice daily for the next three days   *If you need a refill on your cardiac medications before your next appointment, please call your pharmacy*  Follow-Up: At [facility], you and your health needs are our priority.  As part of our continuing mission to provide you with exceptional heart care, our providers are all part of one team.  This team includes your primary Cardiologist (physician) and Advanced Practice Providers or APPs (Physician Assistants and Nurse Practitioners) who all work together to provide you with the care you need, when you need it.  Your next appointment:   6 months  Provider:   You will see one of the following Advanced Practice Providers on your designated Care Team:   Charlies Arthur, PA-C Michael Andy Tillery, PA-C Suzann Riddle, NP Daphne Barrack, NP

## 2024-06-03 ENCOUNTER — Other Ambulatory Visit: Payer: Self-pay | Admitting: *Deleted

## 2024-06-03 NOTE — Transitions of Care (Post Inpatient/ED Visit) (Signed)
 Transition of Care week 5  Visit Note  06/03/2024  Name: Jonathan Gonzalez MRN: [id number]          DOB: 1960/11/08  Situation: Patient enrolled in [facility] 30-day program. Visit completed with [name] by telephone.   Background:   Initial Transition Care Management Follow-up Telephone Call    Past Medical History:  Diagnosis Date   A-fib (HCC)    Anemia    Asthma    Blood transfusion without reported diagnosis 04/30/2024   Diabetes mellitus without complication (HCC)    GERD (gastroesophageal reflux disease)    Hyperlipidemia    Hypertension     Assessment: Patient Reported Symptoms: Cognitive Cognitive Status: Able to follow simple commands, Alert and oriented to person, place, and time, Normal speech and language skills      Neurological Neurological Review of Symptoms: No symptoms reported Neurological Management Strategies: Routine screening, Fluid modification, Adequate rest, Coping strategies Neurological Self-Management Outcome: 3 (uncertain) Neurological Comment: Patient reports one episode of dizziness, fall without injury on Saturday. Feels this was related to increased dose of Lasix . Felt better on Sunday.  HEENT HEENT Symptoms Reported: Not assessed      Cardiovascular Cardiovascular Symptoms Reported: No symptoms reported Does patient have uncontrolled Hypertension?: No Cardiovascular Management Strategies: Medication therapy, Routine screening, Adequate rest Weight: 222 lb (100.7 kg) Cardiovascular Self-Management Outcome: 3 (uncertain) Cardiovascular Comment: Episode of dizziness on Saturday, fall without injury, was able get up. Rested the rest of the evening and felt better on Sunday. Feels this was related to extra dose of Lasix . RNCM advised patient to have his son check his BP and BS if occurs again and follow up with provider.  Respiratory Respiratory Symptoms Reported: No symptoms reported    Endocrine Endocrine Symptoms Reported: No symptoms  reported Endocrine Self-Management Outcome: 4 (good)  Gastrointestinal Gastrointestinal Symptoms Reported: No symptoms reported      Genitourinary Genitourinary Symptoms Reported: No symptoms reported    Integumentary Integumentary Symptoms Reported: No symptoms reported    Musculoskeletal Musculoskelatal Symptoms Reviewed: Limited mobility Additional Musculoskeletal Details: ambulates with a rollator Musculoskeletal Management Strategies: Medical device, Medication therapy, Routine screening Musculoskeletal Self-Management Outcome: 4 (good) Falls in the past year?: Yes Number of falls in past year: 2 or more    Psychosocial Psychosocial Symptoms Reported: Other Other Psychosocial Conditions: Patient scheduled with LCSW on 06/04/24 for managing Grief         There were no vitals filed for this visit.  Medications Reviewed Today     Reviewed by Lucky Andrea LABOR, RN (Registered Nurse) on 06/03/24 at 1300  Med List Status: <None>   Medication Order Taking? Sig Documenting Provider Last Dose Status Informant  albuterol  (VENTOLIN  HFA) 108 (90 Base) MCG/ACT inhaler [id number] Yes Inhale 1-2 puffs into the lungs every 6 (six) hours as needed for wheezing or shortness of breath. Lang Norleen POUR, PA-C  Active Self  amiodarone  (PACERONE ) 200 MG tablet [id number] Yes Take 1 tablet (200 mg total) by mouth daily. Theotis Haze ORN, NP  Active   apixaban  (ELIQUIS ) 5 MG TABS tablet [id number] Yes Take 1 tablet (5 mg total) by mouth 2 (two) times daily. Theotis Haze ORN, NP  Active   ferrous sulfate  325 (65 FE) MG tablet [id number]  Take 1 tablet (325 mg total) by mouth daily with breakfast. FOR ANEMIA  Patient not taking: Reported on 06/03/2024   Fleming, Zelda W, NP  Active   furosemide  (LASIX ) 40 MG tablet [id number] Yes Take 1 tablet (40  mg total) by mouth every other day. Theotis Haze ORN, NP  Active   metFORMIN  (GLUCOPHAGE ) 500 MG tablet [id number] Yes Take 1 tablet (500 mg total) by mouth daily.  For DIABETES Theotis Haze ORN, NP  Active   metoprolol  succinate (TOPROL  XL) 50 MG 24 hr tablet [id number] Yes Take 1 tablet (50 mg total) by mouth daily. Take with or immediately following a meal. Fleming, Zelda W, NP  Active   omeprazole  (PRILOSEC) 20 MG capsule [id number] Yes Take 1 capsule (20 mg total) by mouth daily. FOR ACID REFLUX  Patient taking differently: Take 20 mg by mouth daily as needed. FOR ACID REFLUX   Fleming, Zelda W, NP  Active   sacubitril -valsartan  (ENTRESTO ) 24-26 MG [id number] Yes Take 1 tablet by mouth 2 (two) times daily. Theotis Haze ORN, NP  Active   spironolactone  (ALDACTONE ) 25 MG tablet [id number] Yes Take 0.5 tablets (12.5 mg total) by mouth daily. Theotis Haze ORN, NP  Active   tamsulosin  (FLOMAX ) 0.4 MG CAPS capsule [id number] Yes Take 1 capsule (0.4 mg total) by mouth daily. FOR PROSTATE SYMPTOMS Fleming, Zelda W, NP  Active             Recommendation:   Continue Current Plan of Care  Follow Up Plan:   Telephone follow-up in 1 week  Andrea Dimes RN, BSN [place]  [facility] RN Care Manager [phone]

## 2024-06-03 NOTE — Patient Instructions (Signed)
 Visit Information  Thank you for taking time to visit with me today. Please don't hesitate to contact me if I can be of assistance to you before our next scheduled telephone appointment.   Following is a copy of your care plan:   Goals Addressed             This Visit's Progress    VBCI Transitions of Care (TOC) Care Plan       Problems:  Recent Hospitalization for treatment of Anemia Medication access barrier difficulty obtaining medication refills due to cost of transportation  Goal:  Over the next 30 days, the patient will not experience hospital readmission  Interventions:  Transitions of Care: Doctor Visits  - discussed the importance of doctor visits Post discharge activity limitations prescribed by provider reviewed Medication review-patient reports having all medications and taking as instructed Discussed the importance of daily weights, weighing same time of day and notifying provider with a 2-3 pound weight gain over night or 5 pound in one week-revisited Provided information for medical transportation provided by [employer]-patient has the information on his insurance card and will call to schedule transportation to upcoming appointments-revisited, patient plans to use for appointment on 06/04/24 Reviewed upcoming appointments including: LCSW telephone visit on 06/04/24 and Hematology on 06/04/24 Reviewed GI referral has been placed, patient to follow up with PCP if not scheduled in one week Reviewed Cardiology provider note and discussed  Patient Self Care Activities:  Attend all scheduled provider appointments Call pharmacy for medication refills 3-7 days in advance of running out of medications Call provider office for new concerns or questions  Notify RN Care Manager of [facility] call rescheduling needs Participate in Transition of Care Program/Attend TOC scheduled calls Take medications as prescribed    Plan:  Telephone follow up appointment with care management team  member scheduled for:  06/10/24 at 11:15am        Patient verbalizes understanding of instructions and care plan provided today and agrees to view in MyChart. Active MyChart status and patient understanding of how to access instructions and care plan via MyChart confirmed with patient.     Telephone follow up appointment with care management team member scheduled for:06/10/24 at 11:15am  Please call the care guide team at [phone] if you need to cancel or reschedule your appointment.   Please call [phone]-TALK (toll free, 24 hour hotline) go to [facility] [address], [place] [phone]) call 911 if you are experiencing a Mental Health or Behavioral Health Crisis or need someone to talk to.  Andrea Dimes RN, BSN [place]  [facility] Institute [facility] RN Care Manager [phone]

## 2024-06-04 ENCOUNTER — Other Ambulatory Visit

## 2024-06-04 ENCOUNTER — Other Ambulatory Visit: Payer: Self-pay | Admitting: Licensed Clinical Social Worker

## 2024-06-04 ENCOUNTER — Inpatient Hospital Stay

## 2024-06-04 ENCOUNTER — Inpatient Hospital Stay: Attending: Hematology and Oncology | Admitting: Hematology and Oncology

## 2024-06-04 VITALS — BP 137/80 | HR 70 | Temp 98.0°F | Resp 20 | Wt 225.7 lb

## 2024-06-04 DIAGNOSIS — Z79899 Other long term (current) drug therapy: Secondary | ICD-10-CM

## 2024-06-04 DIAGNOSIS — D62 Acute posthemorrhagic anemia: Secondary | ICD-10-CM | POA: Diagnosis not present

## 2024-06-04 DIAGNOSIS — Z7901 Long term (current) use of anticoagulants: Secondary | ICD-10-CM | POA: Diagnosis not present

## 2024-06-04 DIAGNOSIS — K449 Diaphragmatic hernia without obstruction or gangrene: Secondary | ICD-10-CM

## 2024-06-04 DIAGNOSIS — Z5982 Transportation insecurity: Secondary | ICD-10-CM

## 2024-06-04 DIAGNOSIS — G2581 Restless legs syndrome: Secondary | ICD-10-CM

## 2024-06-04 DIAGNOSIS — D500 Iron deficiency anemia secondary to blood loss (chronic): Secondary | ICD-10-CM | POA: Insufficient documentation

## 2024-06-04 DIAGNOSIS — R5383 Other fatigue: Secondary | ICD-10-CM | POA: Diagnosis not present

## 2024-06-04 DIAGNOSIS — Z604 Social exclusion and rejection: Secondary | ICD-10-CM | POA: Diagnosis not present

## 2024-06-04 DIAGNOSIS — I10 Essential (primary) hypertension: Secondary | ICD-10-CM

## 2024-06-04 DIAGNOSIS — E119 Type 2 diabetes mellitus without complications: Secondary | ICD-10-CM

## 2024-06-04 DIAGNOSIS — Z5986 Financial insecurity: Secondary | ICD-10-CM | POA: Diagnosis not present

## 2024-06-04 DIAGNOSIS — I4891 Unspecified atrial fibrillation: Secondary | ICD-10-CM | POA: Diagnosis not present

## 2024-06-04 DIAGNOSIS — D509 Iron deficiency anemia, unspecified: Secondary | ICD-10-CM

## 2024-06-04 DIAGNOSIS — J45909 Unspecified asthma, uncomplicated: Secondary | ICD-10-CM | POA: Diagnosis not present

## 2024-06-04 DIAGNOSIS — R55 Syncope and collapse: Secondary | ICD-10-CM

## 2024-06-04 LAB — CBC WITH DIFFERENTIAL/PLATELET
Abs Immature Granulocytes: 0.04 K/uL (ref 0.00–0.07)
Basophils Absolute: 0.1 K/uL (ref 0.0–0.1)
Basophils Relative: 1 %
Eosinophils Absolute: 0.2 K/uL (ref 0.0–0.5)
Eosinophils Relative: 2 %
HCT: 40.1 % (ref 39.0–52.0)
Hemoglobin: 11.4 g/dL — ABNORMAL LOW (ref 13.0–17.0)
Immature Granulocytes: 0 %
Lymphocytes Relative: 13 %
Lymphs Abs: 1.4 K/uL (ref 0.7–4.0)
MCH: 21 pg — ABNORMAL LOW (ref 26.0–34.0)
MCHC: 28.4 g/dL — ABNORMAL LOW (ref 30.0–36.0)
MCV: 74 fL — ABNORMAL LOW (ref 80.0–100.0)
Monocytes Absolute: 0.6 K/uL (ref 0.1–1.0)
Monocytes Relative: 6 %
Neutro Abs: 7.9 K/uL — ABNORMAL HIGH (ref 1.7–7.7)
Neutrophils Relative %: 78 %
Platelets: 240 K/uL (ref 150–400)
RBC: 5.42 MIL/uL (ref 4.22–5.81)
RDW: 30.2 % — ABNORMAL HIGH (ref 11.5–15.5)
WBC: 10.2 K/uL (ref 4.0–10.5)
nRBC: 0 % (ref 0.0–0.2)

## 2024-06-04 LAB — IRON AND IRON BINDING CAPACITY (CC-WL,HP ONLY)
Iron: 30 ug/dL — ABNORMAL LOW (ref 45–182)
Saturation Ratios: 6 % — ABNORMAL LOW (ref 17.9–39.5)
TIBC: 496 ug/dL — ABNORMAL HIGH (ref 250–450)
UIBC: 466 ug/dL — ABNORMAL HIGH (ref 117–376)

## 2024-06-04 LAB — CMP (CANCER CENTER ONLY)
ALT: 19 U/L (ref 0–44)
AST: 19 U/L (ref 15–41)
Albumin: 4.1 g/dL (ref 3.5–5.0)
Alkaline Phosphatase: 66 U/L (ref 38–126)
Anion gap: 6 (ref 5–15)
BUN: 10 mg/dL (ref 8–23)
CO2: 27 mmol/L (ref 22–32)
Calcium: 9.1 mg/dL (ref 8.9–10.3)
Chloride: 108 mmol/L (ref 98–111)
Creatinine: 1 mg/dL (ref 0.61–1.24)
GFR, Estimated: >60 mL/min (ref >60)
Glucose, Bld: 99 mg/dL (ref 70–99)
Potassium: 4.1 mmol/L (ref 3.5–5.1)
Sodium: 141 mmol/L (ref 135–145)
Total Bilirubin: 0.5 mg/dL (ref 0.0–1.2)
Total Protein: 7.4 g/dL (ref 6.5–8.1)

## 2024-06-04 LAB — FERRITIN: Ferritin: 27 ng/mL (ref 24–336)

## 2024-06-04 LAB — SAMPLE TO BLOOD BANK

## 2024-06-04 NOTE — Patient Instructions (Signed)
 Visit Information  Mr. Jonathan Gonzalez was given information about Medicaid Managed Care team care coordination services as a part of their [facility] Medicaid benefit. Jonathan Gonzalez   If you would like to schedule transportation through your [facility] plan, please call the following number at least 2 days in advance of your appointment: [phone]  For information about your ride after you set it up, call Ride Assist at [phone]. Use this number to activate a Will Call pickup, or if your transportation is late for a scheduled pickup. Use this number, too, if you need to make a change or cancel a previously scheduled reservation.  If you need transportation services right away, call [phone]. The after-hours call center is staffed 24 hours to handle ride assistance and urgent reservation requests (including discharges) 365 days a year. Urgent trips include sick visits, hospital discharge requests and life-sustaining treatment.  Call the [facility] Line at [phone], at any time, 24 hours a day, 7 days a week. If you are in danger or need immediate medical attention call 911.   Please see education materials related to topics discussed provided by MyChart link.  Patient verbalizes understanding of instructions and care plan provided today and agrees to view in MyChart. Active MyChart status and patient understanding of how to access instructions and care plan via MyChart confirmed with patient.     Licensed Clinical Social Worker will call to f/up with patient on 10/16 at 10 AM  Rolin Kerns, LCSW [place]  [facility], [facility] Clinical Social Worker Direct Dial: [phone]  Fax: [phone] Website: [email or web address] 11:17 AM   Following is a copy of your plan of care:  There are no care plans that you recently modified to display for this patient.

## 2024-06-04 NOTE — Progress Notes (Signed)
 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 Anemia present since December 2023 with fluctuating hemoglobin. Recent dizziness suggests worsening anemia. Eliquis  use may increase bleeding risk. No overt GI bleeding reported. Previous IV iron  therapy administered. - Order blood tests for hemoglobin and iron  levels. - Arrange iron  transfusion if tests indicate low iron . - Advise reporting of fatigue, lightheadedness, or GI bleeding to gastroenterologist or seek hospital care. - Recommend surgical consultation for hernia repair,  - Coordinate with Dr. Haze Theotis for surgical referral.  Addendum: Hb improved since discharge, no indication for blood transfusion, will await Iron  panel and ferritin  HISTORY OF PRESENTING ILLNESS:  Jonathan Gonzalez 63 y.o. male is here because of acute blood loss anemia/IDA  Discussed the use of AI scribe software for clinical note transcription with the patient, who gave verbal consent to proceed.  History of Present Illness Jonathan Gonzalez is a 63 year old male with anemia and atrial fibrillation who presents with symptoms of fatigue and lightheadedness. He is accompanied by his son.  He experienced an episode of near-syncope in a grocery store, feeling as though he was about to pass out. He was recently hospitalized for anemia and received  intravenous iron  due to very low iron  levels, which improved his symptoms upon discharge. He denies any blood in stool or melena prior to hospitalization.  He has a history of a large paraesophageal hernia found on prior endoscopic procedures. He is currently on Eliquis  for atrial fibrillation and reports a history of diabetes, hypertension, and hypercholesterolemia.  He has a history of anemia since 2022, with symptoms including restless legs and tingling in his legs noted during an emergency room visit in June 2025.   He is retired, has never smoked, and previously worked on a Architectural technologist. He eats once a day, usually at the end of the day, and is not fond of pork. He denies any significant weight loss recently.  All other systems were reviewed with the patient and are negative.  MEDICAL HISTORY:  Past Medical History:  Diagnosis Date   A-fib (HCC)    Anemia    Asthma    Blood transfusion without reported diagnosis 04/30/2024   Diabetes mellitus without complication (HCC)    GERD (gastroesophageal reflux disease)    Hyperlipidemia    Hypertension     SURGICAL HISTORY: Past Surgical History:  Procedure Laterality Date   BONE BIOPSY  05/02/2024   Procedure: BIOPSY, GI;  Surgeon: Charlanne Groom, MD;  Location: [facility] ENDOSCOPY;  Service: Gastroenterology;;   VASSIE STUDY  12/06/2022   Procedure: BUBBLE STUDY;  Surgeon: Loni Soyla LABOR, MD;  Location: [facility] ENDOSCOPY;  Service: Cardiovascular;;   CARDIOVERSION N/A 12/06/2022   Procedure: CARDIOVERSION;  Surgeon: Loni Soyla LABOR, MD;  Location: [facility] ENDOSCOPY;  Service: Cardiovascular;  Laterality: N/A;   CARDIOVERSION N/A 01/15/2023   Procedure: CARDIOVERSION;  Surgeon: Cherrie Toribio SAUNDERS, MD;  Location: [facility] INVASIVE CV LAB;  Service: Cardiovascular;  Laterality: N/A;   CARDIOVERSION N/A 02/25/2023   Procedure: CARDIOVERSION;  Surgeon: Cherrie Toribio SAUNDERS, MD;  Location: [facility] INVASIVE CV LAB;  Service: Cardiovascular;  Laterality: N/A;    COLONOSCOPY N/A 05/02/2024   Procedure: COLONOSCOPY;  Surgeon: Charlanne Groom, MD;  Location: [facility] ENDOSCOPY;  Service: Gastroenterology;  Laterality: N/A;   ESOPHAGOGASTRODUODENOSCOPY N/A 05/02/2024   Procedure: EGD (ESOPHAGOGASTRODUODENOSCOPY);  Surgeon: Charlanne Groom, MD;  Location: [facility] ENDOSCOPY;  Service: Gastroenterology;  Laterality: N/A;   HERNIA REPAIR     POLYPECTOMY  05/02/2024   Procedure: POLYPECTOMY, INTESTINE;  Surgeon: Charlanne Groom, MD;  Location: [facility] ENDOSCOPY;  Service: Gastroenterology;;   TEE WITHOUT CARDIOVERSION N/A 12/06/2022   Procedure: TRANSESOPHAGEAL ECHOCARDIOGRAM (TEE);  Surgeon: Loni Soyla LABOR, MD;  Location: [facility] ENDOSCOPY;  Service: Cardiovascular;  Laterality: N/A;   TEE WITHOUT CARDIOVERSION N/A 01/15/2023   Procedure: TRANSESOPHAGEAL ECHOCARDIOGRAM;  Surgeon: Cherrie Toribio SAUNDERS, MD;  Location: [facility] INVASIVE CV LAB;  Service: Cardiovascular;  Laterality: N/A;    SOCIAL HISTORY: Social History   Socioeconomic History   Marital status: Widowed    Spouse name: Not on file   Number of children: 1   Years of education: Not on file   Highest education level: 12th grade  Occupational History   Occupation: [employer]  Tobacco Use   Smoking status: Never   Smokeless tobacco: Never   Tobacco comments:    Never smoke 10/08/22  Vaping Use   Vaping status: Never Used  Substance and Sexual Activity   Alcohol use: Never   Drug use: Never   Sexual activity: Not Currently    Birth control/protection: Abstinence, None  Other Topics Concern   Not on file  Social History Narrative   Not on file   Social Drivers of Health   Financial Resource Strain: High Risk (11/25/2023)   Overall Financial Resource Strain (CARDIA)    Difficulty of Paying Living Expenses: Hard  Food Insecurity: No Food Insecurity (06/04/2024)   Hunger Vital Sign    Worried About Running Out of Food in the Last Year: Never true    Ran Out of Food in the Last Year: Never true  Transportation  Needs: No Transportation Needs (05/06/2024)   PRAPARE - Administrator, Civil Service (Medical): No    Lack of Transportation (Non-Medical): No  Recent Concern: Transportation Needs - Unmet Transportation Needs (04/30/2024)   PRAPARE - Transportation    Lack of Transportation (Medical): Yes    Lack of Transportation (Non-Medical): Yes  Physical Activity: Inactive (11/25/2023)   Exercise Vital Sign    Days of Exercise per Week: 0 days    Minutes of Exercise per Session: 0 min  Stress: Stress Concern Present (11/25/2023)   [employer] of Occupational Health - Occupational Stress Questionnaire    Feeling of Stress : Very much  Social Connections: Socially Isolated (11/25/2023)   Social Connection and Isolation Panel    Frequency of Communication with Friends and Family: Once a week    Frequency of Social Gatherings with Friends and Family: Never    Attends Religious Services: Never    Database administrator or Organizations: No    Attends Engineer, structural: Not on file    Marital Status: Widowed  Intimate Partner Violence: Not At Risk (06/04/2024)   Humiliation, Afraid, Rape, and Kick questionnaire    Fear of Current or Ex-Partner: No    Emotionally Abused: No    Physically Abused: No    Sexually Abused: No  FAMILY HISTORY: No family history on file.  ALLERGIES:  has no known allergies.  MEDICATIONS:  Current Outpatient Medications  Medication Sig Dispense Refill   albuterol  (VENTOLIN  HFA) 108 (90 Base) MCG/ACT inhaler Inhale 1-2 puffs into the lungs every 6 (six) hours as needed for wheezing or shortness of breath. 18 g 2   amiodarone  (PACERONE ) 200 MG tablet Take 1 tablet (200 mg total) by mouth daily. 90 tablet 5   apixaban  (ELIQUIS ) 5 MG TABS tablet Take 1 tablet (5 mg total) by mouth 2 (two) times daily. 90 tablet 0   furosemide  (LASIX ) 40 MG tablet Take 1 tablet (40 mg total) by mouth every other day. 45 tablet 0   metFORMIN  (GLUCOPHAGE ) 500 MG  tablet Take 1 tablet (500 mg total) by mouth daily. For DIABETES 90 tablet 1   metoprolol  succinate (TOPROL  XL) 50 MG 24 hr tablet Take 1 tablet (50 mg total) by mouth daily. Take with or immediately following a meal. 90 tablet 0   omeprazole  (PRILOSEC) 20 MG capsule Take 1 capsule (20 mg total) by mouth daily. FOR ACID REFLUX (Patient taking differently: Take 20 mg by mouth daily as needed. FOR ACID REFLUX) 90 capsule 0   sacubitril -valsartan  (ENTRESTO ) 24-26 MG Take 1 tablet by mouth 2 (two) times daily. 90 tablet 0   spironolactone  (ALDACTONE ) 25 MG tablet Take 0.5 tablets (12.5 mg total) by mouth daily. 45 tablet 0   tamsulosin  (FLOMAX ) 0.4 MG CAPS capsule Take 1 capsule (0.4 mg total) by mouth daily. FOR PROSTATE SYMPTOMS 90 capsule 0   ferrous sulfate  325 (65 FE) MG tablet Take 1 tablet (325 mg total) by mouth daily with breakfast. FOR ANEMIA (Patient not taking: Reported on 06/04/2024) 90 tablet 0   No current facility-administered medications for this visit.     PHYSICAL EXAMINATION: ECOG PERFORMANCE STATUS: 0 - Asymptomatic  Vitals:   06/04/24 1337  BP: 137/80  Pulse: 70  Resp: 20  Temp: 98 F (36.7 C)  SpO2: 99%   Filed Weights   06/04/24 1337  Weight: 225 lb 11.2 oz (102.4 kg)    GENERAL:alert, no distress and comfortable SKIN: skin color, texture, turgor are normal, no rashes or significant lesions EYES: normal, conjunctiva are pink and non-injected, sclera clear OROPHARYNX:no exudate, no erythema and lips, buccal mucosa, and tongue normal  NECK: supple, thyroid  normal size, non-tender, without nodularity LYMPH:  no palpable lymphadenopathy in the cervical, axillary  LUNGS: clear to auscultation and percussion with normal breathing effort HEART: regular rate & rhythm and no murmurs and no lower extremity edema ABDOMEN:abdomen soft, non-tender and normal bowel sounds Musculoskeletal:no cyanosis of digits and no clubbing  PSYCH: alert & oriented x 3 with fluent  speech NEURO: no focal motor/sensory deficits  LABORATORY DATA:  I have reviewed the data as listed Lab Results  Component Value Date   WBC 14.4 (H) 05/02/2024   HGB 9.6 (L) 05/02/2024   HCT 34.9 (L) 05/02/2024   MCV 65.5 (L) 05/02/2024   PLT 245 05/02/2024     Chemistry      Component Value Date/Time   NA 137 05/01/2024 0328   NA 138 11/26/2023 1024   K 3.8 05/01/2024 0328   CL 106 05/01/2024 0328   CO2 22 05/01/2024 0328   BUN 11 05/01/2024 0328   BUN 14 11/26/2023 1024   CREATININE 1.01 05/01/2024 0328      Component Value Date/Time   CALCIUM  8.9 05/01/2024 0328   ALKPHOS 62 05/01/2024 0328  AST 21 05/01/2024 0328   ALT 17 05/01/2024 0328   BILITOT 1.1 05/01/2024 0328   BILITOT 0.6 11/26/2023 1024       RADIOGRAPHIC STUDIES: I have personally reviewed the radiological images as listed and agreed with the findings in the report. No results found.  All questions were answered. The patient knows to call the clinic with any problems, questions or concerns. I spent 45 minutes in the care of this patient including H and P, review of records, counseling and coordination of care.     Amber Stalls, MD 06/04/2024 1:51 PM

## 2024-06-04 NOTE — Patient Outreach (Signed)
 Complex Care Management   Visit Note  06/04/2024  Name:  Jonathan Gonzalez MRN: [id number] DOB: 1960/11/27  Situation: Referral received for Complex Care Management related to Grief I obtained verbal consent from Patient.  Visit completed with Patient  on the phone  Background:   Past Medical History:  Diagnosis Date   A-fib (HCC)    Anemia    Asthma    Blood transfusion without reported diagnosis 04/30/2024   Diabetes mellitus without complication (HCC)    GERD (gastroesophageal reflux disease)    Hyperlipidemia    Hypertension     Assessment: Patient Reported Symptoms:  Cognitive Cognitive Status: Alert and oriented to person, place, and time, Normal speech and language skills Cognitive/Intellectual Conditions Management [RPT]: None reported or documented in medical history or problem list   Health Maintenance Behaviors: Annual physical exam  Neurological Neurological Review of Symptoms: No symptoms reported Neurological Management Strategies: Routine screening  HEENT HEENT Symptoms Reported: No symptoms reported      Cardiovascular Cardiovascular Symptoms Reported: No symptoms reported    Respiratory Respiratory Symptoms Reported: No symptoms reported    Endocrine Endocrine Symptoms Reported: No symptoms reported    Gastrointestinal Gastrointestinal Symptoms Reported: No symptoms reported      Genitourinary Genitourinary Symptoms Reported: No symptoms reported    Integumentary Integumentary Symptoms Reported: No symptoms reported    Musculoskeletal Musculoskelatal Symptoms Reviewed: Limited mobility Additional Musculoskeletal Details: ambulates with a rollator Musculoskeletal Management Strategies: Routine screening, Medical device Falls in the past year?: Yes Number of falls in past year: 2 or more Was there an injury with Fall?: No Fall Risk Category Calculator: 2 Patient Fall Risk Level: Moderate Fall Risk    Psychosocial Psychosocial Symptoms Reported:  Other Other Psychosocial Conditions: Grief Behavioral Management Strategies: Adequate rest, Coping strategies, Support system Major Change/Loss/Stressor/Fears (CP): Death of a loved one Techniques to Cope with Loss/Stress/Change: Diversional activities Quality of Family Relationships: supportive (Limited family support, sister resides in [place]. Gets along well with son) Do you feel physically threatened by others?: No    06/04/2024    PHQ2-9 Depression Screening   Little interest or pleasure in doing things    Feeling down, depressed, or hopeless    PHQ-2 - Total Score    Trouble falling or staying asleep, or sleeping too much    Feeling tired or having little energy    Poor appetite or overeating     Feeling bad about yourself - or that you are a failure or have let yourself or your family down    Trouble concentrating on things, such as reading the newspaper or watching television    Moving or speaking so slowly that other people could have noticed.  Or the opposite - being so fidgety or restless that you have been moving around a lot more than usual    Thoughts that you would be better off dead, or hurting yourself in some way    PHQ2-9 Total Score    If you checked off any problems, how difficult have these problems made it for you to do your work, take care of things at home, or get along with other people    Depression Interventions/Treatment      There were no vitals filed for this visit.  Medications Reviewed Today     Reviewed by Amelie Caracci D, LCSW (Social Worker) on 06/04/24 at 1012  Med List Status: <None>   Medication Order Taking? Sig Documenting Provider Last Dose Status Informant  albuterol  (VENTOLIN   HFA) 108 (90 Base) MCG/ACT inhaler [id number] Yes Inhale 1-2 puffs into the lungs every 6 (six) hours as needed for wheezing or shortness of breath. Lang Norleen POUR, PA-C  Active Self  amiodarone  (PACERONE ) 200 MG tablet [id number] Yes Take 1 tablet (200 mg total) by  mouth daily. Theotis Haze ORN, NP  Active   apixaban  (ELIQUIS ) 5 MG TABS tablet [id number] Yes Take 1 tablet (5 mg total) by mouth 2 (two) times daily. Theotis Haze ORN, NP  Active   ferrous sulfate  325 (65 FE) MG tablet [id number]  Take 1 tablet (325 mg total) by mouth daily with breakfast. FOR ANEMIA  Patient not taking: Reported on 06/04/2024   Fleming, Zelda W, NP  Active   furosemide  (LASIX ) 40 MG tablet [id number] Yes Take 1 tablet (40 mg total) by mouth every other day. Theotis Haze ORN, NP  Active   metFORMIN  (GLUCOPHAGE ) 500 MG tablet [id number] Yes Take 1 tablet (500 mg total) by mouth daily. For DIABETES Fleming, Zelda W, NP  Active   metoprolol  succinate (TOPROL  XL) 50 MG 24 hr tablet [id number] Yes Take 1 tablet (50 mg total) by mouth daily. Take with or immediately following a meal. Fleming, Zelda W, NP  Active   omeprazole  (PRILOSEC) 20 MG capsule [id number] Yes Take 1 capsule (20 mg total) by mouth daily. FOR ACID REFLUX  Patient taking differently: Take 20 mg by mouth daily as needed. FOR ACID REFLUX   Fleming, Zelda W, NP  Active   sacubitril -valsartan  (ENTRESTO ) 24-26 MG [id number] Yes Take 1 tablet by mouth 2 (two) times daily. Theotis Haze ORN, NP  Active   spironolactone  (ALDACTONE ) 25 MG tablet [id number] Yes Take 0.5 tablets (12.5 mg total) by mouth daily. Theotis Haze ORN, NP  Active   tamsulosin  (FLOMAX ) 0.4 MG CAPS capsule [id number] Yes Take 1 capsule (0.4 mg total) by mouth daily. FOR PROSTATE SYMPTOMS Fleming, Zelda W, NP  Active             Recommendation:   Continue Current Plan of Care  Follow Up Plan:   Telephone follow-up in 1 month  Rolin Kerns, LCSW [facility]  [facility], [facility] Clinical Social Worker Direct Dial: [phone]  Fax: [phone] Website: [email or web address] 11:12 AM

## 2024-06-05 ENCOUNTER — Telehealth: Payer: Self-pay | Admitting: *Deleted

## 2024-06-05 NOTE — Progress Notes (Signed)
 Complex Care Management Note Care Guide Note  06/05/2024 Name: Jonathan Gonzalez MRN: [id number] DOB: 1960/11/30   Complex Care Management Outreach Attempts: An unsuccessful telephone outreach was attempted today to offer the patient information about available complex care management services.  Follow Up Plan:  Additional outreach attempts will be made to offer the patient complex care management information and services.   Encounter Outcome:  No Answer  [facility] Care Guide  Direct [phone] Fax:[phone] Website: [email or web address]

## 2024-06-08 ENCOUNTER — Telehealth: Payer: Self-pay | Admitting: *Deleted

## 2024-06-08 NOTE — Progress Notes (Signed)
 Complex Care Management Note Care Guide Note  06/08/2024 Name: Jonathan Gonzalez MRN: [id number] DOB: [birth year]  Jonathan Gonzalez is a 63 y.o. year old male who is a primary care patient of Theotis Haze ORN, NP . The community resource team was consulted for assistance with Food Insecurity  SDOH screenings and interventions completed:  Yes     SDOH Interventions Today    Flowsheet Row Most Recent Value  SDOH Interventions   Food Insecurity Interventions Community Resources Provided  [Will mail food stamo application]     Care guide performed the following interventions: Patient provided with information about care guide support team and interviewed to confirm resource needs.  Follow Up Plan:  No further follow up planned at this time. The patient has been provided with needed resources.  Encounter Outcome:  Patient Visit Completed Jonathan Gonzalez  [name]  Direct Dial:[phone] Fax:[phone] Website: [email or web address]

## 2024-06-10 ENCOUNTER — Encounter: Payer: Self-pay | Admitting: *Deleted

## 2024-06-10 ENCOUNTER — Other Ambulatory Visit: Payer: Self-pay | Admitting: *Deleted

## 2024-06-10 DIAGNOSIS — Z789 Other specified health status: Secondary | ICD-10-CM

## 2024-06-10 NOTE — Transitions of Care (Post Inpatient/ED Visit) (Signed)
 Transition of Care week 6  Visit Note  06/10/2024  Name: Jonathan Gonzalez MRN: [id number]          DOB: 09/06/1961  Situation: Patient enrolled in [facility] 30-day program. Visit completed with Mr. Cookson by telephone.   Background:   Initial Transition Care Management Follow-up Telephone Call    Past Medical History:  Diagnosis Date   A-fib (HCC)    Anemia    Asthma    Blood transfusion without reported diagnosis 04/30/2024   Diabetes mellitus without complication (HCC)    GERD (gastroesophageal reflux disease)    Hyperlipidemia    Hypertension     Assessment: Patient Reported Symptoms: Cognitive Cognitive Status: Able to follow simple commands, Alert and oriented to person, place, and time, Normal speech and language skills      Neurological Neurological Review of Symptoms: No symptoms reported    HEENT HEENT Symptoms Reported: Not assessed      Cardiovascular Cardiovascular Symptoms Reported: Chest pain or discomfort Does patient have uncontrolled Hypertension?: No Cardiovascular Management Strategies: Medication therapy, Routine screening Cardiovascular Comment: Patient reports having episodes of sharp chest pain lasting a few seconds each time every 15-20 minutes during the night. Denies chest pain today. RNCM reviewed when to seek medical attention.  Respiratory Respiratory Symptoms Reported: No symptoms reported    Endocrine Endocrine Symptoms Reported: Not assessed    Gastrointestinal Gastrointestinal Symptoms Reported: No symptoms reported Gastrointestinal Management Strategies: Medication therapy Gastrointestinal Self-Management Outcome: 3 (uncertain) Gastrointestinal Comment: Patient concerned that he will be needing surgery for hernia repair. Advised patient to take omeprazole  everyday as prescribed.    Genitourinary Genitourinary Symptoms Reported: No symptoms reported    Integumentary Integumentary Symptoms Reported: Not assessed    Musculoskeletal  Musculoskelatal Symptoms Reviewed: Limited mobility Additional Musculoskeletal Details: ambulates with rollator Musculoskeletal Self-Management Outcome: 4 (good)      Psychosocial Psychosocial Symptoms Reported: Not assessed         There were no vitals filed for this visit.  Medications Reviewed Today     Reviewed by Lucky Andrea LABOR, RN (Registered Nurse) on 06/10/24 at 1420  Med List Status: <None>   Medication Order Taking? Sig Documenting Provider Last Dose Status Informant  albuterol  (VENTOLIN  HFA) 108 (90 Base) MCG/ACT inhaler [id number] Yes Inhale 1-2 puffs into the lungs every 6 (six) hours as needed for wheezing or shortness of breath. Lang Norleen POUR, PA-C  Active Self  amiodarone  (PACERONE ) 200 MG tablet [id number] Yes Take 1 tablet (200 mg total) by mouth daily. Theotis Haze ORN, NP  Active   apixaban  (ELIQUIS ) 5 MG TABS tablet [id number] Yes Take 1 tablet (5 mg total) by mouth 2 (two) times daily. Theotis Haze ORN, NP  Active   ferrous sulfate  325 (65 FE) MG tablet [id number]  Take 1 tablet (325 mg total) by mouth daily with breakfast. FOR ANEMIA  Patient not taking: Reported on 06/10/2024   Fleming, Zelda W, NP  Active   furosemide  (LASIX ) 40 MG tablet [id number] Yes Take 1 tablet (40 mg total) by mouth every other day. Theotis Haze ORN, NP  Active   metFORMIN  (GLUCOPHAGE ) 500 MG tablet [id number] Yes Take 1 tablet (500 mg total) by mouth daily. For DIABETES Fleming, Zelda W, NP  Active   metoprolol  succinate (TOPROL  XL) 50 MG 24 hr tablet [id number] Yes Take 1 tablet (50 mg total) by mouth daily. Take with or immediately following a meal. Fleming, Zelda W, NP  Active   omeprazole  (PRILOSEC) 20 MG capsule [id number]  Yes Take 1 capsule (20 mg total) by mouth daily. FOR ACID REFLUX  Patient taking differently: Take 20 mg by mouth daily as needed. FOR ACID REFLUX   Theotis Haze ORN, NP  Active   sacubitril -valsartan  (ENTRESTO ) 24-26 MG [id number] Yes Take 1 tablet by mouth 2 (two)  times daily. Theotis Haze ORN, NP  Active   spironolactone  (ALDACTONE ) 25 MG tablet [id number] Yes Take 0.5 tablets (12.5 mg total) by mouth daily. Theotis Haze ORN, NP  Active   tamsulosin  (FLOMAX ) 0.4 MG CAPS capsule [id number] Yes Take 1 capsule (0.4 mg total) by mouth daily. FOR PROSTATE SYMPTOMS Theotis Haze ORN, NP  Active             Recommendation:   Referral to: General Surgery for hernia repair  Follow Up Plan:   Referral to RN Case Manager Closing From:  Transitions of Care Program  Andrea Dimes RN, BSN [place]  [facility] RN Care Manager [phone]

## 2024-06-10 NOTE — Patient Instructions (Signed)
 Visit Information  Thank you for taking time to visit with me today. Please don't hesitate to contact me if I can be of assistance to you before our next scheduled telephone appointment.   Following is a copy of your care plan:   Goals Addressed             This Visit's Progress    COMPLETED: [facility] Transitions of Care (TOC) Care Plan       Problems:  Recent Hospitalization for treatment of Anemia Medication access barrier difficulty obtaining medication refills due to cost of transportation  Goal: Met Over the next 30 days, the patient will not experience hospital readmission  Interventions:  Transitions of Care: Doctor Visits  - discussed the importance of doctor visits Referral to Longitudinal Nurse Case Manager for Ongoing follow-up Post discharge activity limitations prescribed by provider reviewed Medication review-patient reports having all medications and taking as instructed Discussed the importance of daily weights, weighing same time of day and notifying provider with a 2-3 pound weight gain over night or 5 pound in one week-revisited Reviewed upcoming appointments including: LCSW telephone visit on 07/02/24  Discussed patient working with Care Guide for food and housing resources Reviewed GI referral has been placed, patient to follow up with PCP if not scheduled in one week Advised patient to follow up with PCP with concerns or questions Collaborated with PCP, requesting referral to General Surgery per Hematology note Discussed referral to Longitudinal Case Management-patient agrees, referral placed  Patient Self Care Activities:  Attend all scheduled provider appointments Call pharmacy for medication refills 3-7 days in advance of running out of medications Call provider office for new concerns or questions  Notify RN Care Manager of TOC call rescheduling needs Participate in Transition of Care Program/Attend TOC scheduled calls Take medications as prescribed     Plan:  The care management team will reach out to the patient again over the next 30 days.        Patient verbalizes understanding of instructions and care plan provided today and agrees to view in MyChart. Active MyChart status and patient understanding of how to access instructions and care plan via MyChart confirmed with patient.     The care management team will reach out to the patient again over the next 30 days.   Please call the care guide team at [phone] if you need to cancel or reschedule your appointment.   Please call 1-800-273-TALK (toll free, 24 hour hotline) go to [facility] Urgent [facility] [address] [place] [phone]) call 911 if you are experiencing a Mental Health or Behavioral Health Crisis or need someone to talk to.  Andrea Dimes RN, BSN [place]  [facility] RN Care Manager [phone]

## 2024-06-12 ENCOUNTER — Other Ambulatory Visit: Payer: Self-pay | Admitting: Nurse Practitioner

## 2024-06-12 DIAGNOSIS — K449 Diaphragmatic hernia without obstruction or gangrene: Secondary | ICD-10-CM

## 2024-06-16 ENCOUNTER — Other Ambulatory Visit: Payer: Self-pay

## 2024-06-16 ENCOUNTER — Other Ambulatory Visit: Payer: Self-pay | Admitting: *Deleted

## 2024-06-16 DIAGNOSIS — I10 Essential (primary) hypertension: Secondary | ICD-10-CM

## 2024-06-16 NOTE — Patient Instructions (Addendum)
 Visit Information  Mr. Everett was given information about Medicaid Managed Care team care coordination services as a part of their [facility] Medicaid benefit. REINHOLD RICKEY   If you would like to schedule transportation through your [facility] plan, please call the following number at least 2 days in advance of your appointment: [phone]  For information about your ride after you set it up, call Ride Assist at [phone]. Use this number to activate a Will Call pickup, or if your transportation is late for a scheduled pickup. Use this number, too, if you need to make a change or cancel a previously scheduled reservation.  If you need transportation services right away, call [phone]. The after-hours call center is staffed 24 hours to handle ride assistance and urgent reservation requests (including discharges) 365 days a year. Urgent trips include sick visits, hospital discharge requests and life-sustaining treatment.  Call the [facility] Line at [phone], at any time, 24 hours a day, 7 days a week. If you are in danger or need immediate medical attention call 911.   Please see education materials related to HTN provided by MyChart link.  Patient verbalizes understanding of instructions and care plan provided today and agrees to view in MyChart. Active MyChart status and patient understanding of how to access instructions and care plan via MyChart confirmed with patient.     Telephone follow up appointment with Managed Medicaid care management team member scheduled for: 07/07/24 @ 9:30 am  Social Worker appointment to assist with housing resources is 07/02/24 @ 1030 am.    Jaston Havens, RN, Scientist, research (physical sciences), Theatre manager [employer] [phone]

## 2024-06-16 NOTE — Patient Outreach (Signed)
 Complex Care Management   Visit Note  06/16/2024  Name:  Jonathan Gonzalez MRN: [id number] DOB: 1961-07-26  Situation: Referral received for Complex Care Management related to SDOH Barriers:  Housing Insecurity and HTN I obtained verbal consent from Patient.  Visit completed with Patient  on the phone  Background:   Past Medical History:  Diagnosis Date   A-fib (HCC)    Anemia    Asthma    Blood transfusion without reported diagnosis 04/30/2024   Diabetes mellitus without complication (HCC)    GERD (gastroesophageal reflux disease)    Hyperlipidemia    Hypertension     Assessment: Patient Reported Symptoms:  Cognitive Cognitive Status: Able to follow simple commands, Alert and oriented to person, place, and time, Insightful and able to interpret abstract concepts, Normal speech and language skills Cognitive/Intellectual Conditions Management [RPT]: None reported or documented in medical history or problem list   Health Maintenance Behaviors: Annual physical exam, Healthy diet, Sleep adequate, Stress management Healing Pattern: Fast Health Facilitated by: Healthy diet, Prayer/meditation, Rest, Stress management  Neurological Neurological Review of Symptoms: Other:, Headaches [place] Neurological Symptoms/Conditions [RPT]: Patient reports occassional headaches.  He will take [facility] powder if needed to help with headache. Neurological Management Strategies: Adequate rest, Medication therapy, Routine screening Neurological Self-Management Outcome: 4 (good)  HEENT HEENT Symptoms Reported: No symptoms reported HEENT Management Strategies: Routine screening, Adequate rest    Cardiovascular Cardiovascular Symptoms Reported: No symptoms reported Does patient have uncontrolled Hypertension?: No Cardiovascular Management Strategies: Adequate rest, Routine screening Cardiovascular Self-Management Outcome: 4 (good) Cardiovascular Comment: Discussed taking BP every week and recording the numbers.   Respiratory Respiratory Symptoms Reported: No symptoms reported Respiratory Management Strategies: Adequate rest, Routine screening Respiratory Self-Management Outcome: 4 (good)  Endocrine Endocrine Symptoms Reported: No symptoms reported (Patient reports that he is watching his carbs and sweet intake.) Is patient diabetic?: Yes Is patient checking blood sugars at home?: No Endocrine Self-Management Outcome: 4 (good)  Gastrointestinal Gastrointestinal Symptoms Reported: No symptoms reported Gastrointestinal Management Strategies: Adequate rest Gastrointestinal Self-Management Outcome: 3 (uncertain)    Genitourinary Genitourinary Symptoms Reported: No symptoms reported Genitourinary Self-Management Outcome: 4 (good)  Integumentary Integumentary Symptoms Reported: No symptoms reported Skin Management Strategies: Routine screening, Adequate rest Skin Self-Management Outcome: 4 (good)  Musculoskeletal Musculoskelatal Symptoms Reviewed: Limited mobility Additional Musculoskeletal Details: Patient reports that he ambulates with a rolling walker. Musculoskeletal Management Strategies: Routine screening, Medical device Musculoskeletal Self-Management Outcome: 3 (uncertain) Falls in the past year?: Yes Number of falls in past year: 2 or more Was there an injury with Fall?: No Fall Risk Category Calculator: 2 Patient Fall Risk Level: Moderate Fall Risk Patient at Risk for Falls Due to: History of fall(s), Impaired mobility Fall risk Follow up: Falls evaluation completed, Education provided, Falls prevention discussed  Psychosocial Psychosocial Symptoms Reported: Anxiety - if selected complete GAD Other Psychosocial Conditions: Patient reports that he lost his wife a year ago and he is working with LCSW for grief couseling. Behavioral Management Strategies: Adequate rest, Counseling, Support system Behavioral Health Self-Management Outcome: 3 (uncertain) Major Change/Loss/Stressor/Fears (CP):  Death of a loved one Techniques to Cope with Loss/Stress/Change: Diversional activities, Support group Quality of Family Relationships: helpful, involved, stressful Do you feel physically threatened by others?: No    06/16/2024    PHQ2-9 Depression Screening   Little interest or pleasure in doing things Not at all  Feeling down, depressed, or hopeless Not at all  PHQ-2 - Total Score 0  Trouble falling or staying asleep, or  sleeping too much    Feeling tired or having little energy    Poor appetite or overeating     Feeling bad about yourself - or that you are a failure or have let yourself or your family down    Trouble concentrating on things, such as reading the newspaper or watching television    Moving or speaking so slowly that other people could have noticed.  Or the opposite - being so fidgety or restless that you have been moving around a lot more than usual    Thoughts that you would be better off dead, or hurting yourself in some way    PHQ2-9 Total Score    If you checked off any problems, how difficult have these problems made it for you to do your work, take care of things at home, or get along with other people    Depression Interventions/Treatment      There were no vitals filed for this visit.  Medications Reviewed Today     Reviewed by Jorja Nichole LABOR, RN (Case Manager) on 06/16/24 at 1349  Med List Status: <None>   Medication Order Taking? Sig Documenting Provider Last Dose Status Informant  albuterol  (VENTOLIN  HFA) 108 (90 Base) MCG/ACT inhaler [id number] Yes Inhale 1-2 puffs into the lungs every 6 (six) hours as needed for wheezing or shortness of breath. Lang Norleen POUR, PA-C  Active Self  amiodarone  (PACERONE ) 200 MG tablet [id number] Yes Take 1 tablet (200 mg total) by mouth daily. Theotis Haze ORN, NP  Active   apixaban  (ELIQUIS ) 5 MG TABS tablet [id number] Yes Take 1 tablet (5 mg total) by mouth 2 (two) times daily. Fleming, Zelda W, NP  Active   ferrous  sulfate 325 (65 FE) MG tablet [id number]  Take 1 tablet (325 mg total) by mouth daily with breakfast. FOR ANEMIA  Patient not taking: Reported on 06/16/2024   Fleming, Zelda W, NP  Active   furosemide  (LASIX ) 40 MG tablet [id number] Yes Take 1 tablet (40 mg total) by mouth every other day. Fleming, Zelda W, NP  Active   metFORMIN  (GLUCOPHAGE ) 500 MG tablet [id number] Yes Take 1 tablet (500 mg total) by mouth daily. For DIABETES Fleming, Zelda W, NP  Active   metoprolol  succinate (TOPROL  XL) 50 MG 24 hr tablet [id number] Yes Take 1 tablet (50 mg total) by mouth daily. Take with or immediately following a meal. Fleming, Zelda W, NP  Active   omeprazole  (PRILOSEC) 20 MG capsule [id number] Yes Take 1 capsule (20 mg total) by mouth daily. FOR ACID REFLUX  Patient taking differently: Take 20 mg by mouth daily as needed. FOR ACID REFLUX   Theotis Haze ORN, NP  Active   sacubitril -valsartan  (ENTRESTO ) 24-26 MG [id number] Yes Take 1 tablet by mouth 2 (two) times daily. Theotis Haze ORN, NP  Active   spironolactone  (ALDACTONE ) 25 MG tablet [id number] Yes Take 0.5 tablets (12.5 mg total) by mouth daily. Theotis Haze ORN, NP  Active   tamsulosin  (FLOMAX ) 0.4 MG CAPS capsule [id number] Yes Take 1 capsule (0.4 mg total) by mouth daily. FOR PROSTATE SYMPTOMS Theotis Haze ORN, NP  Active             Recommendation:   PCP Follow-up Specialty provider follow-up :Oncology-09/02/24;  Cardiology-10/01/24 Continue Current Plan of Care  Follow Up Plan:   Telephone follow-up 2 weeks: 07/07/24 @ 9:30 am  Keishaun Hazel, RN, BSN, ACM RN Care Manager [employer] [phone]

## 2024-06-17 ENCOUNTER — Other Ambulatory Visit: Payer: Self-pay | Admitting: Hematology and Oncology

## 2024-06-17 ENCOUNTER — Ambulatory Visit: Payer: Self-pay | Admitting: Hematology and Oncology

## 2024-06-17 ENCOUNTER — Telehealth (HOSPITAL_COMMUNITY): Payer: Self-pay

## 2024-06-17 DIAGNOSIS — D509 Iron deficiency anemia, unspecified: Secondary | ICD-10-CM | POA: Insufficient documentation

## 2024-06-17 NOTE — Progress Notes (Signed)
 Ferritin low. Ordered venofer 300 mg to be administered at [facility] infusion.  Jonathan Gonzalez

## 2024-06-17 NOTE — Telephone Encounter (Signed)
 Pt is aware and knows WM will be reaching out to get him scheduled. He is agreeable.

## 2024-06-17 NOTE — Telephone Encounter (Signed)
 Auth Submission: NO AUTH NEEDED Site of care: Site of care: [facility] Payer: [employer] Medication & CPT/J Code(s) submitted: Venofer (Iron  Sucrose) J1756 Diagnosis Code: D50.9 Route of submission (phone, fax, portal):  Phone # Fax # Auth type: Buy/Bill PB Units/visits requested: 300mg  x 3 doses Reference number:  Approval from: 06/17/24 to 09/16/24

## 2024-06-17 NOTE — Telephone Encounter (Signed)
-----   Message from Lyndon Iruku sent at 06/17/2024  8:17 AM EDT ----- Ordered IV iron  to be administered at [facility], please let him know. ----- Message ----- From: Rebecka, Lab In [place] Sent: 06/04/2024   2:41 PM EDT To: Amber Stalls, MD

## 2024-06-22 ENCOUNTER — Other Ambulatory Visit: Payer: Self-pay | Admitting: Nurse Practitioner

## 2024-06-22 ENCOUNTER — Other Ambulatory Visit: Payer: Self-pay

## 2024-06-22 DIAGNOSIS — I480 Paroxysmal atrial fibrillation: Secondary | ICD-10-CM

## 2024-06-22 MED ORDER — APIXABAN 5 MG PO TABS
5.0000 mg | ORAL_TABLET | Freq: Two times a day (BID) | ORAL | 1 refills | Status: AC
  Filled 2024-06-22: qty 180, 90d supply, fill #0
  Filled 2024-09-30: qty 180, 90d supply, fill #1

## 2024-06-23 ENCOUNTER — Other Ambulatory Visit: Payer: Self-pay

## 2024-06-24 ENCOUNTER — Ambulatory Visit (INDEPENDENT_AMBULATORY_CARE_PROVIDER_SITE_OTHER)

## 2024-06-24 VITALS — BP 129/82 | HR 82 | Temp 98.3°F | Resp 20 | Ht 68.0 in | Wt 224.0 lb

## 2024-06-24 DIAGNOSIS — D509 Iron deficiency anemia, unspecified: Secondary | ICD-10-CM | POA: Diagnosis not present

## 2024-06-24 MED ORDER — SODIUM CHLORIDE 0.9 % IV SOLN
300.0000 mg | Freq: Once | INTRAVENOUS | Status: AC
  Administered 2024-06-24: 300 mg via INTRAVENOUS
  Filled 2024-06-24: qty 15

## 2024-06-24 NOTE — Progress Notes (Signed)
 Diagnosis: Iron  Deficiency Anemia  Provider:  Mannam, Praveen MD  Procedure: IV Infusion  IV Type: Peripheral, IV Location: R Antecubital  Venofer (Iron  Sucrose), Dose: 300 mg  Infusion Start Time: 1331  Infusion Stop Time: 1512  Post Infusion IV Care: Observation period completed and Peripheral IV Discontinued  Discharge: Condition: Stable, Destination: Home . AVS Provided  Performed by:  Rocky FORBES Sar, RN

## 2024-06-25 ENCOUNTER — Telehealth: Payer: Self-pay | Admitting: *Deleted

## 2024-06-25 NOTE — Telephone Encounter (Signed)
   Pre-operative Risk Assessment    Patient Name: Jonathan Gonzalez  DOB: April 23, 1961 MRN: [id number]   Date of last office visit: 09/11 Date of next office visit: N/A   Request for Surgical Clearance    Procedure:  HIATAL HERNIA REPAIR  Date of Surgery:  Clearance TBD                                Surgeon:  HERLENE BUREAU, MD Surgeon's Group or Practice Name:  [facility] Phone number:  [phone] Fax number:  [phone]   Type of Clearance Requested:   - Medical  - Pharmacy:  Hold Apixaban  (Eliquis ) NOT INDICATED   Type of Anesthesia:  General    Additional requests/questions:    [facility]   06/25/2024, 12:07 PM

## 2024-06-30 NOTE — Telephone Encounter (Signed)
 Patient with diagnosis of afib on Eliquis  for anticoagulation.    Procedure: HIATAL HERNIA REPAIR  Date of procedure: TBD   CHA2DS2-VASc Score = 3   This indicates a 3.2% annual risk of stroke. The patient's score is based upon: CHF History: 1 HTN History: 1 Diabetes History: 1 Stroke History: 0 Vascular Disease History: 0 Age Score: 0 Gender Score: 0      CrCl 86 ml/min Platelet count 240  Patient has not had an Afib/aflutter ablation in the last 3 months, DCCV within the last 4 weeks or a watchman implanted in the last 45 days   Per office protocol, patient can hold Eliquis  for 2 days prior to procedure.    **This guidance is not considered finalized until pre-operative APP has relayed final recommendations.**

## 2024-06-30 NOTE — Telephone Encounter (Signed)
   Name: Jonathan Gonzalez  DOB: Feb 25, 1961  MRN: [id number]  Primary Cardiologist: Oneil Parchment, MD  Chart reviewed as part of pre-operative protocol coverage. Because of [name] past medical history and time since last visit, he will require a follow-up in-office visit in order to better assess preoperative cardiovascular risk.  Pre-op covering staff: - Please schedule appointment and call patient to inform them. If patient already had an upcoming appointment within acceptable timeframe, please add pre-op clearance to the appointment notes so provider is aware. - Please contact requesting surgeon's office via preferred method (i.e, phone, fax) to inform them of need for appointment prior to surgery.  Per office protocol, patient can hold Eliquis  for 2 days prior to procedure. He can resume when medically safe to do so.   [name], PA-C  06/30/2024, 3:57 PM

## 2024-06-30 NOTE — Telephone Encounter (Signed)
 Patient is needing an appointment scheduled with Heart failure for preop clearance if team can please help schedule patient for an appointment thanks

## 2024-07-01 ENCOUNTER — Ambulatory Visit

## 2024-07-02 ENCOUNTER — Other Ambulatory Visit: Payer: Self-pay

## 2024-07-02 ENCOUNTER — Other Ambulatory Visit: Payer: Self-pay | Admitting: Licensed Clinical Social Worker

## 2024-07-02 NOTE — Patient Instructions (Signed)
 Visit Information  Jonathan Gonzalez was given information about Medicaid Managed Care team care coordination services as a part of their Healthy [facility] Medicaid benefit. ARAMIS WEIL   If you would like to schedule transportation through your Healthy [facility] plan, please call the following number at least 2 days in advance of your appointment: [phone]  For information about your ride after you set it up, call Ride Assist at [phone]. Use this number to activate a Will Call pickup, or if your transportation is late for a scheduled pickup. Use this number, too, if you need to make a change or cancel a previously scheduled reservation.  If you need transportation services right away, call [phone]. The after-hours call center is staffed 24 hours to handle ride assistance and urgent reservation requests (including discharges) 365 days a year. Urgent trips include sick visits, hospital discharge requests and life-sustaining treatment.  Call the [facility] Line at [phone], at any time, 24 hours a day, 7 days a week. If you are in danger or need immediate medical attention call 911.   Please see education materials related to housing resources, FNS application provided by mail.  Patient verbalizes understanding of instructions and care plan provided today and agrees to view in MyChart. Active MyChart status and patient understanding of how to access instructions and care plan via MyChart confirmed with patient.     Telephone follow up appointment with Managed Medicaid care management team member scheduled for: 07/17/2024 at 11:30AM.  Laymon Doll, BSW [place]/VBCI - [facility] Social Worker [phone]   Following is a copy of your plan of care:  There are no care plans that you recently modified to display for this patient.

## 2024-07-02 NOTE — Patient Instructions (Signed)
 Visit Information  Jonathan Gonzalez was given information about Medicaid Managed Care team care coordination services as a part of their Healthy [facility] Medicaid benefit. Jonathan Gonzalez   If you would like to schedule transportation through your Healthy [facility] plan, please call the following number at least 2 days in advance of your appointment: [phone]  For information about your ride after you set it up, call Ride Assist at [phone]. Use this number to activate a Will Call pickup, or if your transportation is late for a scheduled pickup. Use this number, too, if you need to make a change or cancel a previously scheduled reservation.  If you need transportation services right away, call [phone]. The after-hours call center is staffed 24 hours to handle ride assistance and urgent reservation requests (including discharges) 365 days a year. Urgent trips include sick visits, hospital discharge requests and life-sustaining treatment.  Call the [facility] Line at [phone], at any time, 24 hours a day, 7 days a week. If you are in danger or need immediate medical attention call 911.   Please see education materials related to topics discussed provided by MyChart link.  Patient verbalizes understanding of instructions and care plan provided today and agrees to view in MyChart. Active MyChart status and patient understanding of how to access instructions and care plan via MyChart confirmed with patient.     Licensed Clinical Social Worker will call patient on 11/20 at 10 AM  Rolin Kerns, LCSW [place]  [facility], [facility] Clinical Social Worker Direct Dial: [phone]  Fax: [phone] Website: [email or web address] 10:23 AM   Following is a copy of your plan of care:  There are no care plans that you recently modified to display for this patient.

## 2024-07-02 NOTE — Patient Outreach (Signed)
 Complex Care Management   Visit Note  07/02/2024  Name:  Jonathan Gonzalez MRN: [id number] DOB: [birth year]  Situation: Referral received for Complex Care Management related to SDOH Barriers:  Housing affordable housing FNS application I obtained verbal consent from Patient.  Visit completed with Patient  on the phone  Background:   Past Medical History:  Diagnosis Date   A-fib (HCC)    Anemia    Asthma    Blood transfusion without reported diagnosis 04/30/2024   Diabetes mellitus without complication (HCC)    GERD (gastroesophageal reflux disease)    Hyperlipidemia    Hypertension     Assessment: BSW held initial appt with pt. Pt was alert and cognitive. SDOH needs were assessed and the following needs were identified: finding affordable housing and FNS application. Pt states he was suppose to get FNS application from DSS but it was never delivered to him. Pt confirms he can receive mail at Extended stay because he has received other [place] bills there. BSW will mail out FNS application and instructed pt to complete as much as he is able to and then go to DSS to turn it in or have help completing the rest. Pt understood and agreed he could do that. Pt states he has been living in extended stay with his adult son since march of 2025. Pt states they are looking for affordable housing. BSW will provide pt housing resources via mail and provide online housing search tool via email. Pt understood and agreed. Pt states he is looking for housing where him and his son can afford with their incomes and live together since son provides him support and care. Pt expressed being worried about adult son not being independent enough or lacking the necessarily life skills to live independently. Pt requested resources for his son that would help him live independently in the future when he (pt) is no longer there to support him. BSW was not able to provide immediate resources but will consult with BSW  group for additional support. No other resources were provided/requested at this time. BSW provided pt direct phone number and reviewed upcoming appts with pt.   SDOH Interventions    Flowsheet Row Patient Outreach Telephone from 07/02/2024 in [place] POPULATION HEALTH DEPARTMENT Patient Outreach Telephone from 06/16/2024 in [place] POPULATION HEALTH DEPARTMENT Telephone from 06/08/2024 in [place] POPULATION HEALTH DEPARTMENT Patient Outreach Telephone from 06/04/2024 in [place] POPULATION HEALTH DEPARTMENT Telephone from 05/06/2024 in [place] POPULATION HEALTH DEPARTMENT ED to Hosp-Admission (Discharged) from 04/30/2024 in [place] [facility]  SDOH Interventions        Food Insecurity Interventions Intervention Not Indicated  [Is ok on food, but wishes to apply for food stamps.] Intervention Not Indicated Community Resources Provided  [employer] mail food stamo application] Intervention Not Indicated, Assist with SNAP Application, AMB Referral Intervention Not Indicated --  Housing Interventions Community Resources Provided  [BSW will provide housing resources.] Other (Comment)  [LCSW following and assisting with resources.] -- Intervention Not Indicated Intervention Not Indicated --  Transportation Interventions Payor Benefit, Patient Resources (Friends/Family)  [pt uses lyft and uber.] Intervention Not Indicated, Payor Benefit -- -- Intervention Not Indicated  [Patient uses Lift] Inpatient TOC, Taxi Voucher Given, [employer] Provided  Utilities Interventions Intervention Not Indicated Intervention Not Indicated -- Intervention Not Indicated Intervention Not Indicated --      Recommendation:   Review housing resources Complete FNS application and turn in at [employer] office.  Follow Up  Plan:   Telephone follow up appointment date/time:  07/17/2024 at 11:30AM.  Laymon Doll, BSW [employer]/VBCI - [facility] Social Worker [phone]

## 2024-07-02 NOTE — Patient Outreach (Signed)
 Complex Care Management   Visit Note  07/02/2024  Name:  Jonathan Gonzalez MRN: [id number] DOB: 10-14-1960  Situation: Referral received for Complex Care Management related to Grief I obtained verbal consent from Patient.  Visit completed with Patient  on the phone  Background:   Past Medical History:  Diagnosis Date   A-fib (HCC)    Anemia    Asthma    Blood transfusion without reported diagnosis 04/30/2024   Diabetes mellitus without complication (HCC)    GERD (gastroesophageal reflux disease)    Hyperlipidemia    Hypertension     Assessment: Patient Reported Symptoms:  Cognitive Cognitive Status: No symptoms reported, Normal speech and language skills, Alert and oriented to person, place, and time Cognitive/Intellectual Conditions Management [RPT]: None reported or documented in medical history or problem list      Neurological Neurological Review of Symptoms: Not assessed    HEENT HEENT Symptoms Reported: Not assessed      Cardiovascular Cardiovascular Symptoms Reported: Not assessed    Respiratory Respiratory Symptoms Reported: Not assesed    Endocrine Endocrine Symptoms Reported: Not assessed    Gastrointestinal Gastrointestinal Symptoms Reported: Not assessed      Genitourinary Genitourinary Symptoms Reported: Not assessed    Integumentary Integumentary Symptoms Reported: Not assessed    Musculoskeletal Musculoskelatal Symptoms Reviewed: Not assessed        Psychosocial Psychosocial Symptoms Reported: Other Other Psychosocial Conditions: Grief Additional Psychological Details: Pt reports having good and bad days. States he continues to receive strong support from son and is not in need of any grief therapy at this time. Coping skills discussed Behavioral Management Strategies: Adequate rest, Support group, Coping strategies Major Change/Loss/Stressor/Fears (CP): Death of a loved one Techniques to Cope with Loss/Stress/Change: Diversional activities       07/02/2024    PHQ2-9 Depression Screening   Little interest or pleasure in doing things    Feeling down, depressed, or hopeless    PHQ-2 - Total Score    Trouble falling or staying asleep, or sleeping too much    Feeling tired or having little energy    Poor appetite or overeating     Feeling bad about yourself - or that you are a failure or have let yourself or your family down    Trouble concentrating on things, such as reading the newspaper or watching television    Moving or speaking so slowly that other people could have noticed.  Or the opposite - being so fidgety or restless that you have been moving around a lot more than usual    Thoughts that you would be better off dead, or hurting yourself in some way    PHQ2-9 Total Score    If you checked off any problems, how difficult have these problems made it for you to do your work, take care of things at home, or get along with other people    Depression Interventions/Treatment      There were no vitals filed for this visit.  Medications Reviewed Today     Reviewed by Claud Gowan D, LCSW (Social Worker) on 07/02/24 at 1008  Med List Status: <None>   Medication Order Taking? Sig Documenting Provider Last Dose Status Informant  albuterol  (VENTOLIN  HFA) 108 (90 Base) MCG/ACT inhaler [id number]  Inhale 1-2 puffs into the lungs every 6 (six) hours as needed for wheezing or shortness of breath. Lang Norleen POUR, PA-C  Active Self  amiodarone  (PACERONE ) 200 MG tablet [id number]  Take 1 tablet (200  mg total) by mouth daily. Theotis Haze ORN, NP  Active   apixaban  (ELIQUIS ) 5 MG TABS tablet [id number]  Take 1 tablet (5 mg total) by mouth 2 (two) times daily. Newlin, Enobong, MD  Active   ferrous sulfate  325 (65 FE) MG tablet [id number]  Take 1 tablet (325 mg total) by mouth daily with breakfast. FOR ANEMIA  Patient not taking: Reported on 06/16/2024   Fleming, Zelda W, NP  Active   furosemide  (LASIX ) 40 MG tablet [id number]  Take 1 tablet  (40 mg total) by mouth every other day. Theotis Haze ORN, NP  Active   metFORMIN  (GLUCOPHAGE ) 500 MG tablet [id number]  Take 1 tablet (500 mg total) by mouth daily. For DIABETES Fleming, Zelda W, NP  Active   metoprolol  succinate (TOPROL  XL) 50 MG 24 hr tablet [id number]  Take 1 tablet (50 mg total) by mouth daily. Take with or immediately following a meal. Fleming, Zelda W, NP  Active   omeprazole  (PRILOSEC) 20 MG capsule [id number]  Take 1 capsule (20 mg total) by mouth daily. FOR ACID REFLUX  Patient taking differently: Take 20 mg by mouth daily as needed. FOR ACID REFLUX   Theotis Haze ORN, NP  Active   sacubitril -valsartan  (ENTRESTO ) 24-26 MG [id number]  Take 1 tablet by mouth 2 (two) times daily. Theotis Haze ORN, NP  Active   spironolactone  (ALDACTONE ) 25 MG tablet [id number]  Take 0.5 tablets (12.5 mg total) by mouth daily. Theotis Haze ORN, NP  Active   tamsulosin  (FLOMAX ) 0.4 MG CAPS capsule [id number]  Take 1 capsule (0.4 mg total) by mouth daily. FOR PROSTATE SYMPTOMS Fleming, Zelda W, NP  Active             Recommendation:   Continue Current Plan of Care  Follow Up Plan:   Telephone follow-up in 1 month  Rolin Kerns, LCSW [facility]  [facility], [facility] Clinical Social Worker Direct Dial: [phone]  Fax: [phone] Website: [email or web address] 10:22 AM

## 2024-07-07 ENCOUNTER — Other Ambulatory Visit: Payer: Self-pay

## 2024-07-07 ENCOUNTER — Other Ambulatory Visit: Payer: Self-pay | Admitting: *Deleted

## 2024-07-07 NOTE — Patient Instructions (Signed)
 Visit Information  Mr. Brannock was given information about Medicaid Managed Care team care coordination services as a part of their Healthy [facility] Medicaid benefit. JERRI HARGADON   If you would like to schedule transportation through your Healthy [facility] plan, please call the following number at least 2 days in advance of your appointment: [phone]  For information about your ride after you set it up, call Ride Assist at [phone]. Use this number to activate a Will Call pickup, or if your transportation is late for a scheduled pickup. Use this number, too, if you need to make a change or cancel a previously scheduled reservation.  If you need transportation services right away, call [phone]. The after-hours call center is staffed 24 hours to handle ride assistance and urgent reservation requests (including discharges) 365 days a year. Urgent trips include sick visits, hospital discharge requests and life-sustaining treatment.  Call the [facility] Line at [phone], at any time, 24 hours a day, 7 days a week. If you are in danger or need immediate medical attention call 911.   Please see education materials related to HTN provided by MyChart link.  Patient verbalizes understanding of instructions and care plan provided today and agrees to view in MyChart. Active MyChart status and patient understanding of how to access instructions and care plan via MyChart confirmed with patient.     Telephone follow up appointment with Managed Medicaid care management team member scheduled for: 08/11/24 @ 9:30 am  Marijke Guadiana, RN, BSN, [employer] RN Care Manager [employer] [phone]

## 2024-07-07 NOTE — Patient Outreach (Signed)
 Complex Care Management   Visit Note  07/07/2024  Name:  Jonathan Gonzalez MRN: [id number] DOB: 1961/08/26  Situation: Referral received for Complex Care Management related to HTN I obtained verbal consent from Patient.  Visit completed with Patient  on the phone  Background:   Past Medical History:  Diagnosis Date   A-fib (HCC)    Anemia    Asthma    Blood transfusion without reported diagnosis 04/30/2024   Diabetes mellitus without complication (HCC)    GERD (gastroesophageal reflux disease)    Hyperlipidemia    Hypertension     Assessment: Patient Reported Symptoms:  Cognitive Cognitive Status: No symptoms reported, Able to follow simple commands, Difficulties with attention and concentration, Normal speech and language skills, Alert and oriented to person, place, and time Cognitive/Intellectual Conditions Management [RPT]: None reported or documented in medical history or problem list   Health Maintenance Behaviors: Annual physical exam, Healthy diet, Sleep adequate, Stress management Healing Pattern: Fast Health Facilitated by: Healthy diet, Prayer/meditation, Rest, Stress management  Neurological Neurological Review of Symptoms: No symptoms reported, Other: [place] Neurological Symptoms/Conditions [RPT]: Patient reports occassional headaches, in which he reports he has not had headaches recently.  He takes [facility] powder if he needed to help with the headache. Neurological Management Strategies: Adequate rest, Medication therapy, Routine screening Neurological Self-Management Outcome: 4 (good)  HEENT HEENT Symptoms Reported: Other: HEENT Management Strategies: Adequate rest, Coping strategies, Routine screening HEENT Self-Management Outcome: 3 (uncertain) HEENT Comment: Patient reports occassional ringing in his ear.    Cardiovascular Cardiovascular Symptoms Reported: No symptoms reported Does patient have uncontrolled Hypertension?: No Cardiovascular Management Strategies:  Adequate rest, Routine screening Cardiovascular Self-Management Outcome: 4 (good) Cardiovascular Comment: Patient reports that he tool BP this week and BP was 120/70 on yesterday.  Discussed continuing to take BP once weekly and recording the readings.  Respiratory Respiratory Symptoms Reported: No symptoms reported Respiratory Management Strategies: Adequate rest, Routine screening Respiratory Self-Management Outcome: 4 (good)  Endocrine Endocrine Symptoms Reported: No symptoms reported Is patient checking blood sugars at home?: No Endocrine Comment: Noted from EHR last Hgb AIC was 5.6 in August.  Gastrointestinal Gastrointestinal Symptoms Reported: No symptoms reported Gastrointestinal Management Strategies: Adequate rest Gastrointestinal Self-Management Outcome: 4 (good)    Genitourinary Genitourinary Symptoms Reported: No symptoms reported Genitourinary Management Strategies: Adequate rest Genitourinary Self-Management Outcome: 4 (good)  Integumentary Integumentary Symptoms Reported: No symptoms reported Skin Management Strategies: Adequate rest, Routine screening Skin Self-Management Outcome: 4 (good)  Musculoskeletal Musculoskelatal Symptoms Reviewed: Limited mobility Additional Musculoskeletal Details: Patient reports that he ambulates with a rolling walker. Musculoskeletal Management Strategies: Activity, Adequate rest, Coping strategies, Routine screening, Medical device Musculoskeletal Self-Management Outcome: 3 (uncertain) Falls in the past year?: Yes (Patient reports no recent fall since last outreach.) Number of falls in past year: 2 or more Was there an injury with Fall?: No Fall Risk Category Calculator: 2 Patient Fall Risk Level: Moderate Fall Risk Patient at Risk for Falls Due to: History of fall(s), Impaired mobility Fall risk Follow up: Falls evaluation completed, Education provided, Falls prevention discussed  Psychosocial Psychosocial Symptoms Reported: Other,  Sadness - if selected complete PHQ 2-9 Other Psychosocial Conditions: Patient reports still dealing with the lost of his wife.  Patient reports working with LCSW for grief couseling and support. Behavioral Management Strategies: Adequate rest, Coping strategies Behavioral Health Self-Management Outcome: 3 (uncertain) Major Change/Loss/Stressor/Fears (CP): Death of a loved one Techniques to Cope with Loss/Stress/Change: Diversional activities, Counseling Quality of Family Relationships: involved, helpful, supportive Do  you feel physically threatened by others?: No    07/07/2024    PHQ2-9 Depression Screening   Little interest or pleasure in doing things Not at all  Feeling down, depressed, or hopeless Not at all  PHQ-2 - Total Score 0  Trouble falling or staying asleep, or sleeping too much    Feeling tired or having little energy    Poor appetite or overeating     Feeling bad about yourself - or that you are a failure or have let yourself or your family down    Trouble concentrating on things, such as reading the newspaper or watching television    Moving or speaking so slowly that other people could have noticed.  Or the opposite - being so fidgety or restless that you have been moving around a lot more than usual    Thoughts that you would be better off dead, or hurting yourself in some way    PHQ2-9 Total Score    If you checked off any problems, how difficult have these problems made it for you to do your work, take care of things at home, or get along with other people    Depression Interventions/Treatment      Vitals:   07/06/24 1051  BP: 120/70    Medications Reviewed Today     Reviewed by Jorja Nichole LABOR, RN (Case Manager) on 07/07/24 at 1013  Med List Status: <None>   Medication Order Taking? Sig Documenting Provider Last Dose Status Informant  albuterol  (VENTOLIN  HFA) 108 (90 Base) MCG/ACT inhaler [id number] Yes Inhale 1-2 puffs into the lungs every 6 (six) hours as  needed for wheezing or shortness of breath. Lang Norleen POUR, PA-C  Active Self  amiodarone  (PACERONE ) 200 MG tablet [id number] Yes Take 1 tablet (200 mg total) by mouth daily. Theotis Haze ORN, NP  Active   apixaban  (ELIQUIS ) 5 MG TABS tablet [id number] Yes Take 1 tablet (5 mg total) by mouth 2 (two) times daily. Newlin, Enobong, MD  Active   ferrous sulfate  325 (65 FE) MG tablet [id number]  Take 1 tablet (325 mg total) by mouth daily with breakfast. FOR ANEMIA  Patient not taking: Reported on 06/16/2024   Fleming, Zelda W, NP  Active   furosemide  (LASIX ) 40 MG tablet [id number] Yes Take 1 tablet (40 mg total) by mouth every other day. Theotis Haze ORN, NP  Active   metFORMIN  (GLUCOPHAGE ) 500 MG tablet [id number] Yes Take 1 tablet (500 mg total) by mouth daily. For DIABETES Fleming, Zelda W, NP  Active   metoprolol  succinate (TOPROL  XL) 50 MG 24 hr tablet [id number] Yes Take 1 tablet (50 mg total) by mouth daily. Take with or immediately following a meal. Fleming, Zelda W, NP  Active   omeprazole  (PRILOSEC) 20 MG capsule [id number] Yes Take 1 capsule (20 mg total) by mouth daily. FOR ACID REFLUX  Patient taking differently: Take 20 mg by mouth daily as needed. FOR ACID REFLUX   Theotis Haze ORN, NP  Active   sacubitril -valsartan  (ENTRESTO ) 24-26 MG [id number] Yes Take 1 tablet by mouth 2 (two) times daily. Fleming, Zelda W, NP  Active   spironolactone  (ALDACTONE ) 25 MG tablet [id number] Yes Take 0.5 tablets (12.5 mg total) by mouth daily. Theotis Haze ORN, NP  Active   tamsulosin  (FLOMAX ) 0.4 MG CAPS capsule [id number] Yes Take 1 capsule (0.4 mg total) by mouth daily. FOR PROSTATE SYMPTOMS Theotis Haze ORN, NP  Active  Recommendation:   PCP Follow-up Specialty provider follow-up :Oncology-09/02/24; Cardiology-10/01/24 Continue Current Plan of Care  Follow Up Plan:   Telephone follow-up in 1 month: 08/11/24 @ 9:30 am  Tauriel Scronce, RN, BSN, ACM RN Care Manager [employer] [phone]

## 2024-07-08 ENCOUNTER — Ambulatory Visit

## 2024-07-08 VITALS — BP 139/83 | HR 88 | Temp 98.0°F | Resp 18 | Ht 68.0 in | Wt 224.0 lb

## 2024-07-08 DIAGNOSIS — D509 Iron deficiency anemia, unspecified: Secondary | ICD-10-CM | POA: Diagnosis not present

## 2024-07-08 MED ORDER — SODIUM CHLORIDE 0.9 % IV SOLN
300.0000 mg | Freq: Once | INTRAVENOUS | Status: AC
  Administered 2024-07-08: 300 mg via INTRAVENOUS
  Filled 2024-07-08: qty 15

## 2024-07-08 NOTE — Progress Notes (Signed)
 Diagnosis: Iron  Deficiency Anemia  Provider:  Mannam, Praveen MD  Procedure: IV Infusion  IV Type: Peripheral, IV Location: R Antecubital  Venofer (Iron  Sucrose), Dose: 300 mg  Infusion Start Time: 1340  Infusion Stop Time: 1520  Post Infusion IV Care: Patient declined observation and Peripheral IV Discontinued  Discharge: Condition: Good, Destination: Home . AVS Declined  Performed by:  Rocky FORBES Sar, RN

## 2024-07-08 NOTE — Telephone Encounter (Signed)
 Is there a certain day or time this needs to be on?

## 2024-07-10 NOTE — Telephone Encounter (Signed)
 Appt sch for 1/17

## 2024-07-15 ENCOUNTER — Ambulatory Visit (INDEPENDENT_AMBULATORY_CARE_PROVIDER_SITE_OTHER)

## 2024-07-15 VITALS — BP 122/83 | HR 91 | Temp 98.3°F | Resp 20 | Ht 68.0 in | Wt 213.0 lb

## 2024-07-15 DIAGNOSIS — D509 Iron deficiency anemia, unspecified: Secondary | ICD-10-CM | POA: Diagnosis not present

## 2024-07-15 MED ORDER — SODIUM CHLORIDE 0.9 % IV SOLN
300.0000 mg | Freq: Once | INTRAVENOUS | Status: AC
  Administered 2024-07-15: 300 mg via INTRAVENOUS
  Filled 2024-07-15: qty 15

## 2024-07-15 NOTE — Progress Notes (Signed)
 Diagnosis: Iron  Deficiency Anemia  Provider:  Praveen Mannam MD  Procedure: IV Infusion  IV Type: Peripheral, IV Location: R Antecubital  Venofer (Iron  Sucrose), Dose: 300 mg  Infusion Start Time: 1321  Infusion Stop Time: 1502  Post Infusion IV Care: Patient declined observation and Peripheral IV Discontinued  Discharge: Condition: Good, Destination: Home . AVS Declined  Performed by:  Orlean Holtrop, RN

## 2024-07-17 ENCOUNTER — Other Ambulatory Visit: Payer: Self-pay

## 2024-07-17 NOTE — Patient Outreach (Signed)
 Social Drivers of Health  Community Resource and Care Coordination Visit Note   07/17/2024  Name: Jonathan Gonzalez MRN: [id number] DOB:[birth year]  Situation: Referral received for [facility] needs assessment and assistance related to Housing  FNS Application. I obtained verbal consent from Patient.  Visit completed with Patient on the phone.   Background:   SDOH Interventions Today    Flowsheet Row Most Recent Value  SDOH Interventions   Food Insecurity Interventions Community Resources Provided  [BSW provided food resources and SNAP Application. Pt has completed SNAP application and will turn into DSS soon.]  Housing Interventions Community Resources Provided  [housing resources provided via mail.]     Assessment:  Patient confirmed with BSW no other needs for resources at this time and will work on submitting SNAP application soon.   Goals Addressed             This Visit's Progress    COMPLETED: BSW VBCI Social Work Care Plan   On track    Problems:   Housing  and FNS application  CSW Clinical Goal(s):   Over the next 3 weeks the Patient will work with Child Psychotherapist to address concerns related to FNS application and housing needs.  Interventions:  BSW will provide resources for housing needs and FNS application via mail.   Patient Goals/Self-Care Activities:  Review housing resources via mail and complete FNS and drop off at DSS.  Plan:   Telephone follow up appointment with care management team member scheduled for:  07/17/2024 at 11:30AM.        Recommendation:   attend all scheduled provider appointments call for transportation assistance at least one week before appointments complete SNAP application Turn in [facility] Application.  Follow Up Plan:   Patient has achieved all patient stated goals. [employer] will be closed. Patient has been provided contact information should new needs arise.   Laymon Doll, BSW [facility]/VBCI - [employer] Social Worker [phone]

## 2024-07-17 NOTE — Patient Instructions (Signed)
 Visit Information  Jonathan Gonzalez was given information about Medicaid Managed Care team care coordination services as a part of their Healthy [facility] Medicaid benefit. KENSINGTON DUERST   If you would like to schedule transportation through your Healthy [facility] plan, please call the following number at least 2 days in advance of your appointment: [phone]  For information about your ride after you set it up, call Ride Assist at [phone]. Use this number to activate a Will Call pickup, or if your transportation is late for a scheduled pickup. Use this number, too, if you need to make a change or cancel a previously scheduled reservation.  If you need transportation services right away, call [phone]. The after-hours call center is staffed 24 hours to handle ride assistance and urgent reservation requests (including discharges) 365 days a year. Urgent trips include sick visits, hospital discharge requests and life-sustaining treatment.  Call the [facility] Line at [phone], at any time, 24 hours a day, 7 days a week. If you are in danger or need immediate medical attention call 911.    Care plan and visit instructions communicated with the patient verbally today. Patient agrees to receive a copy in MyChart. Active MyChart status and patient understanding of how to access instructions and care plan via MyChart confirmed with patient.     No further follow up required: patient confirmed no other needs at this time.  Jonathan Gonzalez, BSW [place]/VBCI - [employer] Social Worker [phone]   Following is a copy of your plan of care:  There are no care plans that you recently modified to display for this patient.

## 2024-07-24 ENCOUNTER — Ambulatory Visit (HOSPITAL_COMMUNITY)
Admission: RE | Admit: 2024-07-24 | Discharge: 2024-07-24 | Disposition: A | Discharge status: Home / Self Care | Source: Ambulatory Visit | Attending: Cardiology | Admitting: Cardiology

## 2024-07-24 ENCOUNTER — Other Ambulatory Visit: Payer: Self-pay

## 2024-07-24 ENCOUNTER — Encounter (HOSPITAL_COMMUNITY): Payer: Self-pay

## 2024-07-24 DIAGNOSIS — Z59868 Other specified financial insecurity: Secondary | ICD-10-CM | POA: Diagnosis not present

## 2024-07-24 DIAGNOSIS — I4891 Unspecified atrial fibrillation: Secondary | ICD-10-CM

## 2024-07-24 DIAGNOSIS — I10 Essential (primary) hypertension: Secondary | ICD-10-CM | POA: Diagnosis not present

## 2024-07-24 DIAGNOSIS — E785 Hyperlipidemia, unspecified: Secondary | ICD-10-CM | POA: Insufficient documentation

## 2024-07-24 DIAGNOSIS — N183 Chronic kidney disease, stage 3 unspecified: Secondary | ICD-10-CM | POA: Diagnosis not present

## 2024-07-24 DIAGNOSIS — I5022 Chronic systolic (congestive) heart failure: Secondary | ICD-10-CM | POA: Diagnosis not present

## 2024-07-24 DIAGNOSIS — Z79899 Other long term (current) drug therapy: Secondary | ICD-10-CM | POA: Diagnosis not present

## 2024-07-24 DIAGNOSIS — Z7901 Long term (current) use of anticoagulants: Secondary | ICD-10-CM | POA: Insufficient documentation

## 2024-07-24 DIAGNOSIS — I480 Paroxysmal atrial fibrillation: Secondary | ICD-10-CM | POA: Insufficient documentation

## 2024-07-24 DIAGNOSIS — I130 Hypertensive heart and chronic kidney disease with heart failure and stage 1 through stage 4 chronic kidney disease, or unspecified chronic kidney disease: Secondary | ICD-10-CM | POA: Insufficient documentation

## 2024-07-24 DIAGNOSIS — Z7984 Long term (current) use of oral hypoglycemic drugs: Secondary | ICD-10-CM | POA: Diagnosis not present

## 2024-07-24 DIAGNOSIS — I5020 Unspecified systolic (congestive) heart failure: Secondary | ICD-10-CM | POA: Diagnosis not present

## 2024-07-24 DIAGNOSIS — E1122 Type 2 diabetes mellitus with diabetic chronic kidney disease: Secondary | ICD-10-CM | POA: Diagnosis not present

## 2024-07-24 MED ORDER — SACUBITRIL-VALSARTAN 24-26 MG PO TABS
1.0000 | ORAL_TABLET | Freq: Two times a day (BID) | ORAL | 1 refills | Status: AC
  Filled 2024-07-24: qty 90, 45d supply, fill #0
  Filled 2024-09-30: qty 90, 45d supply, fill #1

## 2024-07-24 MED ORDER — METOPROLOL SUCCINATE ER 50 MG PO TB24
75.0000 mg | ORAL_TABLET | Freq: Every day | ORAL | 0 refills | Status: AC
  Filled 2024-07-24: qty 90, 60d supply, fill #0

## 2024-07-24 MED ORDER — AMIODARONE HCL 200 MG PO TABS
200.0000 mg | ORAL_TABLET | Freq: Every day | ORAL | 2 refills | Status: AC
  Filled 2024-07-24 – 2024-07-31 (×2): qty 90, 90d supply, fill #0

## 2024-07-24 NOTE — Patient Instructions (Addendum)
 Good to see you today!   INCREASE toprol  to 75 mg ( 1 1/2 tablets)daily  HOLD Entresto   2 days prior to surgery  Your physician recommends that you schedule a follow-up appointment in: as scheduled  If you have any questions or concerns before your next appointment please send us  a message through [place] or call our office at [phone].    TO LEAVE A MESSAGE FOR THE NURSE SELECT OPTION 2, PLEASE LEAVE A MESSAGE INCLUDING: YOUR NAME DATE OF BIRTH CALL BACK NUMBER REASON FOR CALL**this is important as we prioritize the call backs  YOU WILL RECEIVE A CALL BACK THE SAME DAY AS LONG AS YOU CALL BEFORE 4:00 PM At the [facility], you and your health needs are our priority. As part of our continuing mission to provide you with exceptional heart care, we have created designated Provider Care Teams. These Care Teams include your primary Cardiologist (physician) and Advanced Practice Providers (APPs- Physician Assistants and Nurse Practitioners) who all work together to provide you with the care you need, when you need it.   You may see any of the following providers on your designated Care Team at your next follow up: Dr Toribio Fuel Dr Ezra Shuck Dr. Morene Brownie Greig Mosses, NP Caffie Shed, [place] [facility], [place] Beckey Coe, NP Jordan Lee, NP Ellouise Class, NP Tinnie Redman, PharmD Jaun Bash, PharmD   Please be sure to bring in all your medications bottles to every appointment.    Thank you for choosing [facility]

## 2024-07-24 NOTE — Progress Notes (Signed)
 ADVANCED HF CLINIC NOTE  Primary Care: Haze Servant, NP Primary Cardiologist: Dr. Jeffrie HF Cardiologist: Dr. Cherrie  Reason for Visit: Heart Failure  HPI: Jonathan Gonzalez is a 63 y.o. male with history of HFrEF, PAF(diagnosed 12/23), HTN, HLD, DM, NSVT. and chronic HFrEF.   Patient admitted 09/14/22 with new afib with RVR. Treated with rate control. 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 Echo demonstrated EF 45-50%, RV okay, severe LAE. Plan was for cardioversion at a later date but cancelled the procedure d/t passing of his wife. He later missed doses of eliquis  and ran out of metoprolol  and farxiga . He was admitted with Afib with RVR and a/c CHF in March 2024. EF down slightly further to 35-40%. He was diuresed and GDMT titrated. He underwent successful DCCV during the admission.  TOC Clinic 01/04/23 and was back in AF. Started on amio and underwent outpatient TEE/DCVV on 01/15/23.   Admitted 6/24 from AHF clinic with AF with RVR and a/c dHF. Started on amiodarone  gtt, diuresed and underwent DCCV to NSR.  Most recent echo 7/25 showed normalization of LVEF up to 65-70%, normal RV. No significant valvular dysfunction.   At last OV in the [facility], he was referred to EP to discuss possible afib ablation but I don't see that this was discussed at 9/11 visit w/ Dr. Cindie.   Today he returns for HF follow-up and needs surgical risk assessment to undergo surgical repair of a hiatal hernia.  He is here today w/ his son. Doing well. Able to complete 4 Mets of activity (walk 4 blocks) w/o exertional CP or dyspnea. Reports full compliance w/ meds. BP well controlled at 132/84. EKG shows sinus tach 119 bpm.    Cardiac Studies - Echo 7/25: EF 65-70%, RV nl  - TEE 4/24: EF 30%, severe LAE, mild Jonathan - Echo 3/24: EF 35-40% - Echo 12/23: EF 45%, normal RV  -Zio 01/2024   Predominant underlying rhythm was Sinus Rhythm. 1 run of Ventricular Tachycardia occurred lasting 4 beats  with a max rate of 194 bpm (avg 172 bpm). 9 Supraventricular Tachycardia runs occurred, the run with the fastest interval lasting 4 beats with a max rate of 145 bpm, the longest lasting 5 beats with an avg rate of 108 bpm. Isolated SVEs were rare (<1.0%), SVE Couplets were rare (<1.0%), and SVE Triplets were rare (<1.0%).  Isolated VEs were rare (<1.0%), VE Couplets were rare (<1.0%), and no VE Triplets were present.   Past Medical History:  Diagnosis Date   A-fib (HCC)    Asthma    Diabetes mellitus without complication (HCC)    GERD (gastroesophageal reflux disease)    Hyperlipidemia    Hypertension    Current Outpatient Medications  Medication Sig Dispense Refill   albuterol  (VENTOLIN  HFA) 108 (90 Base) MCG/ACT inhaler Inhale 1-2 puffs into the lungs every 6 (six) hours as needed for wheezing or shortness of breath. 18 g 2   amiodarone  (PACERONE ) 200 MG tablet Take 1 tablet (200 mg total) by mouth daily. 90 tablet 3   apixaban  (ELIQUIS ) 5 MG TABS tablet Take 1 tablet (5 mg total) by mouth 2 (two) times daily. 60 tablet 5   Aspirin-Salicylamide-Caffeine (BC HEADACHE POWDER PO) Take 1 packet by mouth daily as needed (headache, pain).     empagliflozin  (JARDIANCE ) 10 MG TABS tablet Take 1 tablet (10 mg total) by mouth daily. 90 tablet 3   furosemide  (LASIX ) 40 MG tablet Take 1 tablet (40 mg total)  by mouth daily as needed. For 3 lb weight gain overnight or 5 lbs in one week 90 tablet 1   metFORMIN  (GLUCOPHAGE ) 500 MG tablet Take 1 tablet (500 mg total) by mouth 2 (two) times daily with a meal. (Patient taking differently: Take 500 mg by mouth 2 (two) times daily with a meal. Daily) 60 tablet 4   metoprolol  succinate (TOPROL  XL) 50 MG 24 hr tablet Take 1 tablet (50 mg total) by mouth daily. Take with or immediately following a meal. 90 tablet 1   omeprazole  (PRILOSEC) 20 MG capsule Take 1 capsule (20 mg total) by mouth daily. FOR ACID REFLUX (Patient taking differently: Take 20 mg by mouth  daily. FOR ACID REFLUX AS NEEDED) 90 capsule 1   sacubitril -valsartan  (ENTRESTO ) 24-26 MG Take 1 tablet by mouth 2 (two) times daily. 60 tablet 11   Vitamin D , Ergocalciferol , (DRISDOL ) 1.25 MG (50000 UNIT) CAPS capsule Take 1 capsule (50,000 Units total) by mouth once a week. (Patient taking differently: Take 50,000 Units by mouth once a week. Takes on Tuesdays) 12 capsule 0   nitrofurantoin , macrocrystal-monohydrate, (MACROBID ) 100 MG capsule Take 1 capsule (100 mg total) by mouth 2 (two) times daily for 7 days. 14 capsule 0   No current facility-administered medications for this encounter.   No Known Allergies  Social History   Socioeconomic History   Marital status: Widowed    Spouse name: Not on file   Number of children: 1   Years of education: Not on file   Highest education level: 12th grade  Occupational History   Occupation: [employer]  Tobacco Use   Smoking status: Never   Smokeless tobacco: Never   Tobacco comments:    Never smoke 10/08/22  Vaping Use   Vaping status: Never Used  Substance and Sexual Activity   Alcohol use: Never   Drug use: Never   Sexual activity: Not Currently  Other Topics Concern   Not on file  Social History Narrative   Not on file   Social Drivers of Health   Financial Resource Strain: High Risk (11/25/2023)   Overall Financial Resource Strain (CARDIA)    Difficulty of Paying Living Expenses: Hard  Food Insecurity: No Food Insecurity (11/25/2023)   Hunger Vital Sign    Worried About Running Out of Food in the Last Year: Never true    Ran Out of Food in the Last Year: Never true  Transportation Needs: Unmet Transportation Needs (01/07/2024)   PRAPARE - Transportation    Lack of Transportation (Medical): Yes    Lack of Transportation (Non-Medical): Yes  Physical Activity: Inactive (11/25/2023)   Exercise Vital Sign    Days of Exercise per Week: 0 days    Minutes of Exercise per Session: 0 min  Stress: Stress Concern Present  (11/25/2023)   [employer] of Occupational Health - Occupational Stress Questionnaire    Feeling of Stress : Very much  Social Connections: Socially Isolated (11/25/2023)   Social Connection and Isolation Panel [NHANES]    Frequency of Communication with Friends and Family: Once a week    Frequency of Social Gatherings with Friends and Family: Never    Attends Religious Services: Never    Database Administrator or Organizations: No    Attends Engineer, Structural: Not on file    Marital Status: Widowed  Intimate Partner Violence: Not At Risk (12/04/2022)   Humiliation, Afraid, Rape, and Kick questionnaire    Fear of Current or Ex-Partner: No  Emotionally Abused: No    Physically Abused: No    Sexually Abused: No   No family history on file.  There were no vitals filed for this visit.   There were no vitals filed for this visit.  Wt Readings from Last 3 Encounters:  07/15/24 96.6 kg (213 lb)  07/08/24 101.6 kg (224 lb)  06/24/24 101.6 kg (224 lb)    PHYSICAL EXAM: Vitals:   07/24/24 1417  BP: 132/84  Pulse: (!) 116  SpO2: 96%   GENERAL: NAD Lungs- clear  CARDIAC:  JVP not elevated         Regular rhythm, tachy rate. No MRG. No LEE  ABDOMEN: Soft, non-tender, non-distended.  EXTREMITIES: Warm and well perfused.  NEUROLOGIC: No obvious FND     EKG: ST 107 bpm  ASSESSMENT & PLAN: 1. PAF  - Successful DCCV 3/24, 4/24 & 6/24 - EKG today shows ST  - Continue amiodarone  200 mg daily  - Increase Toprol  XL to 75 mg daily  - Continue Eliquis  5 mg bid - EP following. Would recommend consideration for afib ablation. 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    2. Chronic systolic HF - Suspect possibly tachy-mediated. Discovered in setting of Afib with RVR in 08/2022.  - Echo 12/23: EF 45-50%, RV okay, severe LAE. No prior echo for  comparison. - Echo 3/24 in setting of Afib with RVR: EF 35-40% - TEE 4/24 EF 30% - Echo 7/25 EF 65-70%, RV normal  - NYHA I. Euvolemic on exam  - Continue Entresto  24/26 mg bid - Increase Toprol  XL to 75 mg daily  - Continue Jardiance  10 mg daily - Continue Spiro 12.5 mg daily   3. HTN - controlled on current regimen    4. HLD - Continue atorvastatin .    5. CKD: Stage 3 - Baseline Scr ~1.5.  - Continue SGLT2i - reviewed recent BMP. SCr and K WNL   6. Pre-op Risk Assessment - pt w/ recent echo 7/25 showing improved EF 65-70%, RV normal. No significant valvular abnormalties. He is able to complete > 4 METS of physical activity w/o exertional CP or dyspnea. EKG w/ no ischemic changes. Ok to proceed w/ hiatal hernia surgery. No indication for further cardiac resting. Continue ? blocker during the perioperative period. Ok to hold Eliquis  perioperatively. Resume once safe to do so. Recommend holding Entresto  2 days prior to surgery to reduce risk of perioperative hypotension/vasoplegia.   F/u w/ APP in 6 months   Jonathan Shed, PA-C

## 2024-07-29 ENCOUNTER — Other Ambulatory Visit: Payer: Self-pay

## 2024-07-31 ENCOUNTER — Other Ambulatory Visit: Payer: Self-pay

## 2024-08-06 ENCOUNTER — Other Ambulatory Visit: Payer: Self-pay | Admitting: Licensed Clinical Social Worker

## 2024-08-06 NOTE — Patient Outreach (Signed)
 Complex Care Management   Visit Note  08/06/2024  Name:  Jonathan Gonzalez MRN: [id number] DOB: 1961-08-09  Situation: Referral received for Complex Care Management related to Grief I obtained verbal consent from Patient.  Visit completed with Patient  on the phone  Background:   Past Medical History:  Diagnosis Date   A-fib (HCC)    Anemia    Asthma    Blood transfusion without reported diagnosis 04/30/2024   Diabetes mellitus without complication (HCC)    GERD (gastroesophageal reflux disease)    Hyperlipidemia    Hypertension     Assessment: Patient Reported Symptoms:  Cognitive Cognitive Status: No symptoms reported, Alert and oriented to person, place, and time, Normal speech and language skills Cognitive/Intellectual Conditions Management [RPT]: None reported or documented in medical history or problem list   Health Maintenance Behaviors: Annual physical exam  Neurological Neurological Review of Symptoms: Not assessed    HEENT HEENT Symptoms Reported: Not assessed      Cardiovascular Cardiovascular Symptoms Reported: Not assessed    Respiratory Respiratory Symptoms Reported: Not assesed    Endocrine Endocrine Symptoms Reported: Not assessed    Gastrointestinal Gastrointestinal Symptoms Reported: Not assessed      Genitourinary Genitourinary Symptoms Reported: Not assessed    Integumentary Integumentary Symptoms Reported: Not assessed    Musculoskeletal Musculoskelatal Symptoms Reviewed: Not assessed        Psychosocial Psychosocial Symptoms Reported: No symptoms reported Additional Psychological Details: Pt reports he is doing well and receives strong support from adult son Behavioral Management Strategies: Coping strategies, Adequate rest, Support system Behavioral Health Self-Management Outcome: 4 (good) Major Change/Loss/Stressor/Fears (CP): Denies Techniques to Cope with Loss/Stress/Change: Diversional activities      08/06/2024    PHQ2-9  Depression Screening   Little interest or pleasure in doing things    Feeling down, depressed, or hopeless    PHQ-2 - Total Score    Trouble falling or staying asleep, or sleeping too much    Feeling tired or having little energy    Poor appetite or overeating     Feeling bad about yourself - or that you are a failure or have let yourself or your family down    Trouble concentrating on things, such as reading the newspaper or watching television    Moving or speaking so slowly that other people could have noticed.  Or the opposite - being so fidgety or restless that you have been moving around a lot more than usual    Thoughts that you would be better off dead, or hurting yourself in some way    PHQ2-9 Total Score    If you checked off any problems, how difficult have these problems made it for you to do your work, take care of things at home, or get along with other people    Depression Interventions/Treatment      There were no vitals filed for this visit.    Medications Reviewed Today     Reviewed by Ezzard Rolin BIRCH, LCSW (Social Worker) on 08/06/24 at 1053  Med List Status: <None>   Medication Order Taking? Sig Documenting Provider Last Dose Status Informant  albuterol  (VENTOLIN  HFA) 108 (90 Base) MCG/ACT inhaler [id number]  Inhale 1-2 puffs into the lungs every 6 (six) hours as needed for wheezing or shortness of breath. Lang Norleen POUR, PA-C  Active Self  amiodarone  (PACERONE ) 200 MG tablet [id number]  Take 1 tablet (200 mg total) by mouth daily. Marcine Caffie HERO, PA-C  Active  apixaban  (ELIQUIS ) 5 MG TABS tablet [id number]  Take 1 tablet (5 mg total) by mouth 2 (two) times daily. Newlin, Enobong, MD  Active   ferrous sulfate  325 (65 FE) MG tablet [id number]  Take 1 tablet (325 mg total) by mouth daily with breakfast. FOR ANEMIA  Patient not taking: Reported on 06/16/2024   Fleming, Zelda W, NP  Active   furosemide  (LASIX ) 40 MG tablet [id number]  Take 1 tablet (40 mg total) by  mouth every other day. Theotis Haze ORN, NP  Active   metFORMIN  (GLUCOPHAGE ) 500 MG tablet [id number]  Take 1 tablet (500 mg total) by mouth daily. For DIABETES Theotis Haze ORN, NP  Active   metoprolol  succinate (TOPROL  XL) 50 MG 24 hr tablet [id number]  Take 1.5 tablets (75 mg total) by mouth daily. Take with or immediately following a meal. Marcine Caffie HERO, PA-C  Active   omeprazole  (PRILOSEC) 20 MG capsule [id number]  Take 1 capsule (20 mg total) by mouth daily. FOR ACID REFLUX Fleming, Zelda W, NP  Active   sacubitril -valsartan  (ENTRESTO ) 24-26 MG [id number]  Take 1 tablet by mouth 2 (two) times daily. Marcine Caffie M, PA-C  Active   spironolactone  (ALDACTONE ) 25 MG tablet [id number]  Take 0.5 tablets (12.5 mg total) by mouth daily. Theotis Haze ORN, NP  Active   tamsulosin  (FLOMAX ) 0.4 MG CAPS capsule [id number]  Take 1 capsule (0.4 mg total) by mouth daily. FOR PROSTATE SYMPTOMS Fleming, Zelda W, NP  Active             Recommendation:   Continue Current Plan of Care  Follow Up Plan:   Patient has met all care management goals. Care Management case will be closed. Patient has been provided contact information should new needs arise.   Rolin Ezzard HUGHS [facility]  [facility], [facility] Clinical Social Worker Direct Dial: [phone]  Fax: [phone] Website: [email or web address] 10:59 AM

## 2024-08-06 NOTE — Patient Instructions (Signed)
 Visit Information  Jonathan Gonzalez was given information about Medicaid Managed Care team care coordination services as a part of their Healthy [facility] Medicaid benefit. Jonathan Gonzalez   If you would like to schedule transportation through your Healthy [facility] plan, please call the following number at least 2 days in advance of your appointment: [phone]  For information about your ride after you set it up, call Ride Assist at [phone]. Use this number to activate a Will Call pickup, or if your transportation is late for a scheduled pickup. Use this number, too, if you need to make a change or cancel a previously scheduled reservation.  If you need transportation services right away, call [phone]. The after-hours call center is staffed 24 hours to handle ride assistance and urgent reservation requests (including discharges) 365 days a year. Urgent trips include sick visits, hospital discharge requests and life-sustaining treatment.  Call the [facility] Line at [phone], at any time, 24 hours a day, 7 days a week. If you are in danger or need immediate medical attention call 911.   Please see education materials related to topics discussed provided by MyChart link.  Care plan and visit instructions communicated with the patient verbally today. Patient agrees to receive a copy in MyChart. Active MyChart status and patient understanding of how to access instructions and care plan via MyChart confirmed with patient.     No further follow up required: Pt agreed to f/up with PCP and/or RNCM if any new needs arise  Jonathan Kerns, LCSW [facility]  [facility], [place] Day Clinical Social Worker Direct Dial: [phone]  Fax: [phone] Website: [email or web address] 11:00 AM   Following is a copy of your plan of care:  There are no care plans that you recently modified to display for this patient.

## 2024-08-11 ENCOUNTER — Other Ambulatory Visit: Payer: Self-pay | Admitting: *Deleted

## 2024-08-11 ENCOUNTER — Other Ambulatory Visit: Payer: Self-pay

## 2024-08-11 NOTE — Patient Instructions (Signed)
 Visit Information  Mr. Able was given information about Medicaid Managed Care team care coordination services as a part of their Healthy [facility] Medicaid benefit. BARACK NICODEMUS   If you would like to schedule transportation through your Healthy [facility] plan, please call the following number at least 2 days in advance of your appointment: [phone]  For information about your ride after you set it up, call Ride Assist at [phone]. Use this number to activate a Will Call pickup, or if your transportation is late for a scheduled pickup. Use this number, too, if you need to make a change or cancel a previously scheduled reservation.  If you need transportation services right away, call [phone]. The after-hours call center is staffed 24 hours to handle ride assistance and urgent reservation requests (including discharges) 365 days a year. Urgent trips include sick visits, hospital discharge requests and life-sustaining treatment.  Call the [facility] Line at [phone], at any time, 24 hours a day, 7 days a week. If you are in danger or need immediate medical attention call 911.   Please see education materials related to HTN provided by MyChart link.  Care plan and visit instructions communicated with the patient verbally today. Patient agrees to receive a copy in MyChart. Active MyChart status and patient understanding of how to access instructions and care plan via MyChart confirmed with patient.     Telephone follow up appointment with Managed Medicaid care management team member scheduled for: 09/28/24 @ 10 am  Judas Mohammad, RN, BSN, [facility] RN Care Manager [employer] Population Health [phone]   Visit Information  Mr. Wicke was given information about Medicaid Managed Care team care coordination services as a part of their Healthy [facility] Medicaid benefit. SATYA BUTTRAM   If you would like to schedule transportation through your Healthy [facility]  plan, please call the following number at least 2 days in advance of your appointment: [phone]  For information about your ride after you set it up, call Ride Assist at [phone]. Use this number to activate a Will Call pickup, or if your transportation is late for a scheduled pickup. Use this number, too, if you need to make a change or cancel a previously scheduled reservation.  If you need transportation services right away, call [phone]. The after-hours call center is staffed 24 hours to handle ride assistance and urgent reservation requests (including discharges) 365 days a year. Urgent trips include sick visits, hospital discharge requests and life-sustaining treatment.  Call the [facility] Line at [phone], at any time, 24 hours a day, 7 days a week. If you are in danger or need immediate medical attention call 911.   Please see education materials related to HTN provided by MyChart link.  Care plan and visit instructions communicated with the patient verbally today. Patient agrees to receive a copy in MyChart. Active MyChart status and patient understanding of how to access instructions and care plan via MyChart confirmed with patient.     Telephone follow up appointment with Managed Medicaid care management team member scheduled for: 09/28/24 @ 10 am  Jyl Ch[place], RN, BSN, Theatre Manager [employer] [phone]

## 2024-08-11 NOTE — Patient Outreach (Signed)
 Complex Care Management   Visit Note  08/11/2024  Name:  Jonathan Gonzalez MRN: [id number] DOB: 1961-02-17  Situation: Referral received for Complex Care Management related to SDOH Barriers:  Housing Insecurity  and HTN I obtained verbal consent from Patient.  Visit completed with Patient  on the phone  Background:   Past Medical History:  Diagnosis Date   A-fib (HCC)    Anemia    Asthma    Blood transfusion without reported diagnosis 04/30/2024   Diabetes mellitus without complication (HCC)    GERD (gastroesophageal reflux disease)    Hyperlipidemia    Hypertension     Assessment: Patient Reported Symptoms:  Cognitive Cognitive Status: No symptoms reported, Alert and oriented to person, place, and time, Normal speech and language skills Cognitive/Intellectual Conditions Management [RPT]: None reported or documented in medical history or problem list   Health Maintenance Behaviors: Annual physical exam, Healthy diet, Stress management, Sleep adequate Healing Pattern: Fast Health Facilitated by: Healthy diet, Prayer/meditation, Stress management, Rest  Neurological Neurological Review of Symptoms: Headaches Oher Neurological Symptoms/Conditions [RPT]: Patient reports occassional headaches.  He reports that he takes [facility] [place] powder Neurological Management Strategies: Adequate rest, Medication therapy, Routine screening Neurological Self-Management Outcome: 3 (uncertain)  HEENT HEENT Symptoms Reported: Other: HEENT Management Strategies: Adequate rest, Coping strategies, Routine screening HEENT Comment: Patient reports that he has occassional ringing in his ear.    Cardiovascular Cardiovascular Symptoms Reported: Chest pain or discomfort Does patient have uncontrolled Hypertension?: No Cardiovascular Management Strategies: Adequate rest, Routine screening Cardiovascular Self-Management Outcome: 3 (uncertain) Cardiovascular Comment: Patient reports that he has chest pain due to hernia  that he need to have surgically repaired.  Patient reports that he takes his blood pressure daily.  However, he did not have any readings.  He reported that he forgot what his blood pressure was on yesterday. I have encouraged Mrs. Latif to continue to take blood pressure daily and document the readings on a log.  Respiratory Respiratory Symptoms Reported: Shortness of breath Other Respiratory Symptoms: Patient reports some shortness of breath and coughing that started this week.  He informs me that he has been using his inhaler with some relief.  He reports that he has an appointment with PCP on 08/17/24.  I have informed him to seek medical attention if cough and shortness of breath continues to progress. Respiratory Management Strategies: Adequate rest, Routine screening Respiratory Self-Management Outcome: 3 (uncertain)  Endocrine Endocrine Symptoms Reported: No symptoms reported Is patient diabetic?: Yes Is patient checking blood sugars at home?: No Endocrine Self-Management Outcome: 3 (uncertain)  Gastrointestinal Gastrointestinal Symptoms Reported: No symptoms reported Gastrointestinal Management Strategies: Adequate rest Gastrointestinal Self-Management Outcome: 4 (good)    Genitourinary Genitourinary Symptoms Reported: No symptoms reported Genitourinary Management Strategies: Adequate rest Genitourinary Self-Management Outcome: 4 (good)  Integumentary Integumentary Symptoms Reported: Other Other Integumentary Symptoms: Reports a sore/scab on his left elbow.  He is placing antiobitic cream on elbow. Skin Management Strategies: Adequate rest, Routine screening Skin Self-Management Outcome: 3 (uncertain)  Musculoskeletal Musculoskelatal Symptoms Reviewed: Limited mobility Additional Musculoskeletal Details: Patient reports that he ambulates with a rolling walker. Musculoskeletal Management Strategies: Adequate rest, Activity, Coping strategies, Routine screening, Medical  device Musculoskeletal Self-Management Outcome: 3 (uncertain) Falls in the past year?: Yes (Patient reports no falls since last outreach.) Number of falls in past year: 2 or more Was there an injury with Fall?: No Fall Risk Category Calculator: 2 Patient Fall Risk Level: Moderate Fall Risk Patient at Risk for Falls Due to: History  of fall(s), Impaired mobility Fall risk Follow up: Falls evaluation completed, Education provided, Falls prevention discussed  Psychosocial Psychosocial Symptoms Reported: Anxiety - if selected complete GAD Other Psychosocial Conditions: LCSW folloing patient. Behavioral Management Strategies: Coping strategies, Adequate rest, Support system Behavioral Health Self-Management Outcome: 4 (good) Major Change/Loss/Stressor/Fears (CP): Denies Techniques to Cope with Loss/Stress/Change: Diversional activities, Counseling, Medication Quality of Family Relationships: helpful, involved, supportive Do you feel physically threatened by others?: No    08/11/2024    PHQ2-9 Depression Screening   Little interest or pleasure in doing things Not at all  Feeling down, depressed, or hopeless Not at all  PHQ-2 - Total Score 0  Trouble falling or staying asleep, or sleeping too much    Feeling tired or having little energy    Poor appetite or overeating     Feeling bad about yourself - or that you are a failure or have let yourself or your family down    Trouble concentrating on things, such as reading the newspaper or watching television    Moving or speaking so slowly that other people could have noticed.  Or the opposite - being so fidgety or restless that you have been moving around a lot more than usual    Thoughts that you would be better off dead, or hurting yourself in some way    PHQ2-9 Total Score    If you checked off any problems, how difficult have these problems made it for you to do your work, take care of things at home, or get along with other people     Depression Interventions/Treatment      There were no vitals filed for this visit. Pain Scale: 0-10 Pain Score: 0-No pain  Medications Reviewed Today     Reviewed by Jorja Nichole LABOR, RN (Case Manager) on 08/11/24 at 0920  Med List Status: <None>   Medication Order Taking? Sig Documenting Provider Last Dose Status Informant  albuterol  (VENTOLIN  HFA) 108 (90 Base) MCG/ACT inhaler [id number] Yes Inhale 1-2 puffs into the lungs every 6 (six) hours as needed for wheezing or shortness of breath. Lang Norleen POUR, PA-C  Active Self  amiodarone  (PACERONE ) 200 MG tablet [id number] Yes Take 1 tablet (200 mg total) by mouth daily. Marcine Catalan M, PA-C  Active   apixaban  (ELIQUIS ) 5 MG TABS tablet [id number] Yes Take 1 tablet (5 mg total) by mouth 2 (two) times daily. Newlin, Enobong, MD  Active   ferrous sulfate  325 (65 FE) MG tablet [id number]  Take 1 tablet (325 mg total) by mouth daily with breakfast. FOR ANEMIA  Patient not taking: Reported on 06/16/2024   Fleming, Zelda W, NP  Active   furosemide  (LASIX ) 40 MG tablet [id number] Yes Take 1 tablet (40 mg total) by mouth every other day. Theotis Haze ORN, NP  Active   metFORMIN  (GLUCOPHAGE ) 500 MG tablet [id number] Yes Take 1 tablet (500 mg total) by mouth daily. For DIABETES Theotis Haze ORN, NP  Active   metoprolol  succinate (TOPROL  XL) 50 MG 24 hr tablet [id number] Yes Take 1.5 tablets (75 mg total) by mouth daily. Take with or immediately following a meal. Marcine Catalan HERO, PA-C  Active   omeprazole  (PRILOSEC) 20 MG capsule [id number] Yes Take 1 capsule (20 mg total) by mouth daily. FOR ACID REFLUX Theotis Haze ORN, NP  Active   sacubitril -valsartan  (ENTRESTO ) 24-26 MG [id number] Yes Take 1 tablet by mouth 2 (two) times daily. Marcine Catalan M, PA-C  Active   spironolactone  (ALDACTONE ) 25 MG  tablet [id number] Yes Take 0.5 tablets (12.5 mg total) by mouth daily. Theotis Haze ORN, NP  Active   tamsulosin  (FLOMAX ) 0.4 MG CAPS capsule [id number]  Yes Take 1 capsule (0.4 mg total) by mouth daily. FOR PROSTATE SYMPTOMS Fleming, Zelda W, NP  Active             Recommendation:   PCP Follow-up Specialty provider follow-up Oncology-09/02/24; Cardiology-01/21/25 Continue Current Plan of Care  Follow Up Plan:   Telephone follow-up in 1 month: 09/28/24 @ 10 am  Cairo Agostinelli, RN, BSN, [employer] RN Care Manager [employer] [phone]

## 2024-08-17 ENCOUNTER — Other Ambulatory Visit: Payer: Self-pay

## 2024-08-17 ENCOUNTER — Ambulatory Visit: Attending: Nurse Practitioner | Admitting: Nurse Practitioner

## 2024-08-17 ENCOUNTER — Encounter: Payer: Self-pay | Admitting: Nurse Practitioner

## 2024-08-17 VITALS — BP 109/78 | HR 89 | Resp 19 | Ht 68.0 in | Wt 226.0 lb

## 2024-08-17 DIAGNOSIS — E119 Type 2 diabetes mellitus without complications: Secondary | ICD-10-CM | POA: Diagnosis not present

## 2024-08-17 DIAGNOSIS — I10 Essential (primary) hypertension: Secondary | ICD-10-CM

## 2024-08-17 DIAGNOSIS — F5102 Adjustment insomnia: Secondary | ICD-10-CM

## 2024-08-17 DIAGNOSIS — D649 Anemia, unspecified: Secondary | ICD-10-CM | POA: Diagnosis not present

## 2024-08-17 DIAGNOSIS — Z7984 Long term (current) use of oral hypoglycemic drugs: Secondary | ICD-10-CM

## 2024-08-17 DIAGNOSIS — E7800 Pure hypercholesterolemia, unspecified: Secondary | ICD-10-CM

## 2024-08-17 MED ORDER — ATORVASTATIN CALCIUM 10 MG PO TABS
10.0000 mg | ORAL_TABLET | Freq: Every day | ORAL | 3 refills | Status: AC
  Filled 2024-08-17: qty 90, 90d supply, fill #0

## 2024-08-17 MED ORDER — HYDROXYZINE PAMOATE 25 MG PO CAPS
25.0000 mg | ORAL_CAPSULE | Freq: Three times a day (TID) | ORAL | 0 refills | Status: DC | PRN
  Filled 2024-08-17: qty 30, 5d supply, fill #0

## 2024-08-17 MED ORDER — HYDROXYZINE PAMOATE 25 MG PO CAPS
25.0000 mg | ORAL_CAPSULE | Freq: Every evening | ORAL | 0 refills | Status: AC | PRN
  Filled 2024-08-17: qty 60, 30d supply, fill #0

## 2024-08-17 NOTE — Progress Notes (Signed)
 ,  Assessment & Plan:  [name] was seen today for hypertension.  Diagnoses and all orders for this visit:  Primary hypertension Continue all antihypertensives as prescribed.  Reminded to bring in blood pressure log for follow  up appointment.  RECOMMENDATIONS: DASH/Mediterranean Diets are healthier choices for HTN.    Diabetes mellitus treated with oral medication (HCC) -     Urine Albumin/Creatinine with ratio (send out) [LAB689]  Adjustment insomnia -     hydrOXYzine  (VISTARIL ) 25 MG capsule; Take 1-2 capsules (25-50 mg total) by mouth at bedtime as needed.  Anemia, unspecified type -     CBC with Differential  Pure hypercholesterolemia -     atorvastatin  (LIPITOR) 10 MG tablet; Take 1 tablet (10 mg total) by mouth daily. For cholesterol and INSTRUCTIONS: Work on a low fat, heart healthy diet and participate in regular aerobic exercise program by working out at least 150 minutes per week; 5 days a week-30 minutes per day. Avoid red meat/beef/steak,  fried foods. junk foods, sodas, sugary drinks, unhealthy snacking, alcohol and smoking.  Drink at least 80 oz of water per day and monitor your carbohydrate intake daily.    Patient has been counseled on age-appropriate routine health concerns for screening and prevention. These are reviewed and up-to-date. Referrals have been placed accordingly. Immunizations are up-to-date or declined.    Subjective:   Chief Complaint  Patient presents with   Hypertension   History of Present Illness Jonathan Gonzalez is a 63 year old male with a history of hiatal hernia and atrial fibrillation who presents with concerns about upcoming surgery and for follow up to HTN.  He is concerned about an upcoming surgery for a large hiatal hernia located in the middle of his chest. The hernia is causing anatomical changes, including being associated with his anemia.  He has a history of atrial fibrillation and is currently on Eliquis . He experiences  occasional chest pain, especially when he is stressed.  He is also dealing with anxiety and has been prescribed hydroxyzine  in the past. Recently, he has been experiencing difficulty sleeping, going two to three days without rest. He has tried over-the-counter melatonin but does not recall the dosage.  Socially, he is the primary caregiver for his son, who has a severe learning disability and receives SSI. His son lives with him, assists with daily activities, and ensures he takes his medications. He expresses concern about who would care for his son if something were to happen to him during or after the surgery. He has a sister in [place] but is unsure of her ability to help due to her own issues.   HTN Blood pressure.  He is currently prescribed spironolactone  12.5 mg daily, Entresto  24-26 mg twice daily, Toprol -XL 75 mg daily, amiodarone  200 mg daily BP Readings from Last 3 Encounters:  08/17/24 109/78  07/24/24 132/84  07/15/24 122/83    Review of Systems  Constitutional:  Positive for malaise/fatigue. Negative for fever and weight loss.  HENT: Negative.  Negative for nosebleeds.   Eyes: Negative.  Negative for blurred vision, double vision and photophobia.  Respiratory: Negative.  Negative for cough and shortness of breath.   Cardiovascular: Negative.  Negative for chest pain, palpitations and leg swelling.  Gastrointestinal: Negative.  Negative for heartburn, nausea and vomiting.  Musculoskeletal: Negative.  Negative for myalgias.  Neurological: Negative.  Negative for dizziness, focal weakness, seizures and headaches.  Psychiatric/Behavioral:  Negative for suicidal ideas. The patient has insomnia.  Past Medical History:  Diagnosis Date   A-fib (HCC)    Anemia    Asthma    Blood transfusion without reported diagnosis 04/30/2024   Diabetes mellitus without complication (HCC)    GERD (gastroesophageal reflux disease)    Hyperlipidemia    Hypertension     Past Surgical  History:  Procedure Laterality Date   BONE BIOPSY  05/02/2024   Procedure: BIOPSY, GI;  Surgeon: Charlanne Groom, MD;  Location: [facility] ENDOSCOPY;  Service: Gastroenterology;;   VASSIE STUDY  12/06/2022   Procedure: BUBBLE STUDY;  Surgeon: Loni Soyla LABOR, MD;  Location: [facility] ENDOSCOPY;  Service: Cardiovascular;;   CARDIOVERSION N/A 12/06/2022   Procedure: CARDIOVERSION;  Surgeon: Loni Soyla LABOR, MD;  Location: [facility] ENDOSCOPY;  Service: Cardiovascular;  Laterality: N/A;   CARDIOVERSION N/A 01/15/2023   Procedure: CARDIOVERSION;  Surgeon: Cherrie Toribio SAUNDERS, MD;  Location: [facility] INVASIVE CV LAB;  Service: Cardiovascular;  Laterality: N/A;   CARDIOVERSION N/A 02/25/2023   Procedure: CARDIOVERSION;  Surgeon: Cherrie Toribio SAUNDERS, MD;  Location: [facility] INVASIVE CV LAB;  Service: Cardiovascular;  Laterality: N/A;   COLONOSCOPY N/A 05/02/2024   Procedure: COLONOSCOPY;  Surgeon: Charlanne Groom, MD;  Location: [facility] ENDOSCOPY;  Service: Gastroenterology;  Laterality: N/A;   ESOPHAGOGASTRODUODENOSCOPY N/A 05/02/2024   Procedure: EGD (ESOPHAGOGASTRODUODENOSCOPY);  Surgeon: Charlanne Groom, MD;  Location: [facility] ENDOSCOPY;  Service: Gastroenterology;  Laterality: N/A;   HERNIA REPAIR     POLYPECTOMY  05/02/2024   Procedure: POLYPECTOMY, INTESTINE;  Surgeon: Charlanne Groom, MD;  Location: [facility] ENDOSCOPY;  Service: Gastroenterology;;   TEE WITHOUT CARDIOVERSION N/A 12/06/2022   Procedure: TRANSESOPHAGEAL ECHOCARDIOGRAM (TEE);  Surgeon: Loni Soyla LABOR, MD;  Location: [facility] ENDOSCOPY;  Service: Cardiovascular;  Laterality: N/A;   TEE WITHOUT CARDIOVERSION N/A 01/15/2023   Procedure: TRANSESOPHAGEAL ECHOCARDIOGRAM;  Surgeon: Cherrie Toribio SAUNDERS, MD;  Location: [facility] INVASIVE CV LAB;  Service: Cardiovascular;  Laterality: N/A;    No family history on file.  Social History Reviewed with no changes to be made today.   Outpatient Medications Prior to Visit  Medication Sig Dispense Refill   albuterol  (VENTOLIN  HFA) 108 (90 Base) MCG/ACT  inhaler Inhale 1-2 puffs into the lungs every 6 (six) hours as needed for wheezing or shortness of breath. 18 g 2   amiodarone  (PACERONE ) 200 MG tablet Take 1 tablet (200 mg total) by mouth daily. 90 tablet 2   apixaban  (ELIQUIS ) 5 MG TABS tablet Take 1 tablet (5 mg total) by mouth 2 (two) times daily. 180 tablet 1   furosemide  (LASIX ) 40 MG tablet Take 1 tablet (40 mg total) by mouth every other day. 45 tablet 0   metFORMIN  (GLUCOPHAGE ) 500 MG tablet Take 1 tablet (500 mg total) by mouth daily. For DIABETES 90 tablet 1   metoprolol  succinate (TOPROL  XL) 50 MG 24 hr tablet Take 1.5 tablets (75 mg total) by mouth daily. Take with or immediately following a meal. 90 tablet 0   omeprazole  (PRILOSEC) 20 MG capsule Take 1 capsule (20 mg total) by mouth daily. FOR ACID REFLUX 90 capsule 0   sacubitril -valsartan  (ENTRESTO ) 24-26 MG Take 1 tablet by mouth 2 (two) times daily. 90 tablet 1   spironolactone  (ALDACTONE ) 25 MG tablet Take 0.5 tablets (12.5 mg total) by mouth daily. 45 tablet 0   tamsulosin  (FLOMAX ) 0.4 MG CAPS capsule Take 1 capsule (0.4 mg total) by mouth daily. FOR PROSTATE SYMPTOMS 90 capsule 0   ferrous sulfate  325 (65 FE) MG tablet Take 1 tablet (325 mg total) by mouth daily  with breakfast. FOR ANEMIA (Patient not taking: Reported on 06/16/2024) 90 tablet 0   No facility-administered medications prior to visit.    No Known Allergies     Objective:    BP 109/78 (BP Location: Left Arm, Patient Position: Sitting, Cuff Size: Normal)   Pulse 89   Resp 19   Ht 5' 8 (1.727 m)   Wt 226 lb (102.5 kg)   SpO2 100%   BMI 34.36 kg/m  Wt Readings from Last 3 Encounters:  08/17/24 226 lb (102.5 kg)  07/24/24 222 lb 9.6 oz (101 kg)  07/15/24 213 lb (96.6 kg)    Physical Exam Vitals and nursing note reviewed.  Constitutional:      Appearance: He is well-developed.  HENT:     Head: Normocephalic and atraumatic.  Cardiovascular:     Rate and Rhythm: Normal rate and regular rhythm.      Heart sounds: Normal heart sounds. No murmur heard.    No friction rub. No gallop.  Pulmonary:     Effort: Pulmonary effort is normal. No tachypnea or respiratory distress.     Breath sounds: Normal breath sounds. No decreased breath sounds, wheezing, rhonchi or rales.  Chest:     Chest wall: No tenderness.  Musculoskeletal:        General: Normal range of motion.     Cervical back: Normal range of motion.  Skin:    General: Skin is warm and dry.  Neurological:     Mental Status: He is alert and oriented to person, place, and time.     Coordination: Coordination normal.  Psychiatric:        Behavior: Behavior normal. Behavior is cooperative.        Thought Content: Thought content normal.        Judgment: Judgment normal.          Patient has been counseled extensively about nutrition and exercise as well as the importance of adherence with medications and regular follow-up. The patient was given clear instructions to go to ER or return to medical center if symptoms don't improve, worsen or new problems develop. The patient verbalized understanding.   Follow-up: Return in about 3 months (around 11/15/2024).   Haze LELON Servant, FNP-BC [facility] [place], [place] [phone]   08/17/2024, 11:36 AM

## 2024-08-19 LAB — CBC WITH DIFFERENTIAL/PLATELET
Basophils Absolute: 0.1 x10E3/uL (ref 0.0–0.2)
Basos: 1 %
EOS (ABSOLUTE): 0.2 x10E3/uL (ref 0.0–0.4)
Eos: 1 %
Hematocrit: 48.4 % (ref 37.5–51.0)
Hemoglobin: 14.6 g/dL (ref 13.0–17.7)
Immature Grans (Abs): 0 x10E3/uL (ref 0.0–0.1)
Immature Granulocytes: 0 %
Lymphocytes Absolute: 1.7 x10E3/uL (ref 0.7–3.1)
Lymphs: 15 %
MCH: 24.4 pg — ABNORMAL LOW (ref 26.6–33.0)
MCHC: 30.2 g/dL — ABNORMAL LOW (ref 31.5–35.7)
MCV: 81 fL (ref 79–97)
Monocytes Absolute: 0.7 x10E3/uL (ref 0.1–0.9)
Monocytes: 6 %
Neutrophils Absolute: 8.9 x10E3/uL — ABNORMAL HIGH (ref 1.4–7.0)
Neutrophils: 77 %
Platelets: 324 x10E3/uL (ref 150–450)
RBC: 5.99 x10E6/uL — ABNORMAL HIGH (ref 4.14–5.80)
RDW: 18.7 % — ABNORMAL HIGH (ref 11.6–15.4)
WBC: 11.6 x10E3/uL — ABNORMAL HIGH (ref 3.4–10.8)

## 2024-08-19 LAB — MICROALBUMIN / CREATININE URINE RATIO
Creatinine, Urine: 289.4 mg/dL
Microalb/Creat Ratio: 16 mg/g{creat} (ref 0–29)
Microalbumin, Urine: 46.5 ug/mL

## 2024-08-23 ENCOUNTER — Ambulatory Visit: Payer: Self-pay | Admitting: Nurse Practitioner

## 2024-08-31 NOTE — Addendum Note (Signed)
 Addended by: DAYNE SHERRY RAMAN on: 08/31/2024 02:52 PM   Modules accepted: Orders

## 2024-09-02 ENCOUNTER — Other Ambulatory Visit: Payer: Self-pay | Admitting: Hematology and Oncology

## 2024-09-02 ENCOUNTER — Inpatient Hospital Stay: Admitting: Hematology and Oncology

## 2024-09-02 ENCOUNTER — Inpatient Hospital Stay: Attending: Hematology and Oncology

## 2024-09-02 DIAGNOSIS — D509 Iron deficiency anemia, unspecified: Secondary | ICD-10-CM

## 2024-09-02 NOTE — Progress Notes (Deleted)
 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 Anemia present since December 2023 with fluctuating hemoglobin.    HISTORY OF PRESENTING ILLNESS:  Jonathan Gonzalez 63 y.o. male is here because of acute blood loss anemia/IDA  Discussed the use of AI scribe software for clinical note transcription with the patient, who gave verbal consent to proceed.  History of Present Illness Jonathan Gonzalez is a 63 year old male with anemia and atrial fibrillation who presents with symptoms of fatigue and lightheadedness. He is accompanied by his son.  He experienced an episode of near-syncope in a grocery store, feeling as though he was about to pass out. He was recently hospitalized for anemia and received intravenous iron  due to very low iron  levels, which improved his symptoms upon discharge. He denies any blood in stool or melena prior to hospitalization.  He has a history of a large paraesophageal hernia found on prior endoscopic procedures. He is currently on Eliquis  for atrial fibrillation and reports a history of diabetes, hypertension, and hypercholesterolemia.  He has a history of anemia since 2022, with symptoms including restless legs and tingling in his legs noted during an emergency room visit in June 2025.   He is retired, has never smoked, and previously worked on a architectural technologist.  He eats once a day, usually at the end of the day, and is not fond of pork. He denies any significant weight loss recently.  All other systems were reviewed with the patient and are negative.  MEDICAL HISTORY:  Past Medical History:  Diagnosis Date   A-fib (HCC)    Anemia    Asthma    Blood transfusion without reported diagnosis 04/30/2024   Diabetes mellitus without complication (HCC)    GERD (gastroesophageal reflux disease)    Hyperlipidemia    Hypertension     SURGICAL HISTORY: Past Surgical History:  Procedure Laterality Date   BONE BIOPSY  05/02/2024   Procedure: BIOPSY, GI;  Surgeon: Charlanne Groom, MD;  Location: [facility] ENDOSCOPY;  Service: Gastroenterology;;   VASSIE STUDY  12/06/2022   Procedure: BUBBLE STUDY;  Surgeon: Loni Soyla LABOR, MD;  Location: [facility] ENDOSCOPY;  Service: Cardiovascular;;   CARDIOVERSION N/A 12/06/2022   Procedure: CARDIOVERSION;  Surgeon: Loni Soyla LABOR, MD;  Location: [facility] ENDOSCOPY;  Service: Cardiovascular;  Laterality: N/A;   CARDIOVERSION N/A 01/15/2023   Procedure: CARDIOVERSION;  Surgeon: Cherrie Toribio SAUNDERS, MD;  Location: [facility] INVASIVE CV LAB;  Service: Cardiovascular;  Laterality: N/A;   CARDIOVERSION N/A 02/25/2023   Procedure: CARDIOVERSION;  Surgeon: Cherrie Toribio SAUNDERS, MD;  Location: [facility] INVASIVE CV LAB;  Service: Cardiovascular;  Laterality: N/A;   COLONOSCOPY N/A 05/02/2024   Procedure: COLONOSCOPY;  Surgeon: Charlanne Groom, MD;  Location: [facility] ENDOSCOPY;  Service: Gastroenterology;  Laterality: N/A;   ESOPHAGOGASTRODUODENOSCOPY N/A 05/02/2024   Procedure: EGD (ESOPHAGOGASTRODUODENOSCOPY);  Surgeon: Charlanne Groom, MD;  Location: [facility] ENDOSCOPY;  Service: Gastroenterology;  Laterality: N/A;   HERNIA REPAIR  POLYPECTOMY  05/02/2024   Procedure: POLYPECTOMY, INTESTINE;  Surgeon: Charlanne Groom, MD;  Location: [facility] ENDOSCOPY;  Service: Gastroenterology;;   TEE WITHOUT CARDIOVERSION N/A 12/06/2022   Procedure: TRANSESOPHAGEAL ECHOCARDIOGRAM (TEE);   Surgeon: Loni Soyla LABOR, MD;  Location: [facility] ENDOSCOPY;  Service: Cardiovascular;  Laterality: N/A;   TEE WITHOUT CARDIOVERSION N/A 01/15/2023   Procedure: TRANSESOPHAGEAL ECHOCARDIOGRAM;  Surgeon: Cherrie Toribio SAUNDERS, MD;  Location: [facility] INVASIVE CV LAB;  Service: Cardiovascular;  Laterality: N/A;    SOCIAL HISTORY: Social History   Socioeconomic History   Marital status: Widowed    Spouse name: Not on file   Number of children: 1   Years of education: Not on file   Highest education level: 12th grade  Occupational History   Occupation: [employer]  Tobacco Use   Smoking status: Never   Smokeless tobacco: Never   Tobacco comments:    Never smoke 10/08/22  Vaping Use   Vaping status: Never Used  Substance and Sexual Activity   Alcohol use: Never   Drug use: Never   Sexual activity: Not Currently    Birth control/protection: Abstinence, None  Other Topics Concern   Not on file  Social History Narrative   Not on file   Social Drivers of Health   Tobacco Use: Low Risk (08/17/2024)   Patient History    Smoking Tobacco Use: Never    Smokeless Tobacco Use: Never    Passive Exposure: Not on file  Financial Resource Strain: Low Risk (08/13/2024)   Overall Financial Resource Strain (CARDIA)    Difficulty of Paying Living Expenses: Not very hard  Food Insecurity: No Food Insecurity (08/13/2024)   Epic    Worried About Programme Researcher, Broadcasting/film/video in the Last Year: Never true    Ran Out of Food in the Last Year: Never true  Transportation Needs: No Transportation Needs (08/13/2024)   Epic    Lack of Transportation (Medical): No    Lack of Transportation (Non-Medical): No  Physical Activity: Inactive (08/13/2024)   Exercise Vital Sign    Days of Exercise per Week: 0 days    Minutes of Exercise per Session: Not on file  Stress: Stress Concern Present (08/13/2024)   [employer] of Occupational Health - Occupational Stress Questionnaire    Feeling of Stress: To some extent   Social Connections: Socially Isolated (08/13/2024)   Social Connection and Isolation Panel    Frequency of Communication with Friends and Family: Never    Frequency of Social Gatherings with Friends and Family: Never    Attends Religious Services: Never    Database Administrator or Organizations: No    Attends Engineer, Structural: Not on file    Marital Status: Widowed  Intimate Partner Violence: Not At Risk (08/11/2024)   Epic    Fear of Current or Ex-Partner: No    Emotionally Abused: No    Physically Abused: No    Sexually Abused: No  Depression (PHQ2-9): Medium Risk (08/17/2024)   Depression (PHQ2-9)    PHQ-2 Score: 5  Alcohol Screen: Low Risk (06/04/2024)   Alcohol Screen    Last Alcohol Screening Score (AUDIT): 0  Housing: Low Risk (08/13/2024)   Epic    Unable to Pay for Housing in the Last Year: No    Number of Times Moved in the Last Year: 1    Homeless in the Last Year: No  Recent Concern: Housing - High Risk (08/11/2024)   Epic    Unable to Pay  for Housing in the Last Year: Yes    Number of Times Moved in the Last Year: 1    Homeless in the Last Year: No  Utilities: Not At Risk (08/11/2024)   Epic    Threatened with loss of utilities: No  Health Literacy: Adequate Health Literacy (08/26/2023)   B1300 Health Literacy    Frequency of need for help with medical instructions: Never    FAMILY HISTORY: No family history on file.  ALLERGIES:  has no known allergies.  MEDICATIONS:  Current Outpatient Medications  Medication Sig Dispense Refill   albuterol  (VENTOLIN  HFA) 108 (90 Base) MCG/ACT inhaler Inhale 1-2 puffs into the lungs every 6 (six) hours as needed for wheezing or shortness of breath. 18 g 2   amiodarone  (PACERONE ) 200 MG tablet Take 1 tablet (200 mg total) by mouth daily. 90 tablet 2   apixaban  (ELIQUIS ) 5 MG TABS tablet Take 1 tablet (5 mg total) by mouth 2 (two) times daily. 180 tablet 1   atorvastatin  (LIPITOR) 10 MG tablet Take 1 tablet  (10 mg total) by mouth daily. For cholesterol 90 tablet 3   ferrous sulfate  325 (65 FE) MG tablet Take 1 tablet (325 mg total) by mouth daily with breakfast. FOR ANEMIA (Patient not taking: Reported on 06/16/2024) 90 tablet 0   furosemide  (LASIX ) 40 MG tablet Take 1 tablet (40 mg total) by mouth every other day. 45 tablet 0   hydrOXYzine  (VISTARIL ) 25 MG capsule Take 1-2 capsules (25-50 mg total) by mouth at bedtime as needed. 60 capsule 0   metFORMIN  (GLUCOPHAGE ) 500 MG tablet Take 1 tablet (500 mg total) by mouth daily. For DIABETES 90 tablet 1   metoprolol  succinate (TOPROL  XL) 50 MG 24 hr tablet Take 1.5 tablets (75 mg total) by mouth daily. Take with or immediately following a meal. 90 tablet 0   omeprazole  (PRILOSEC) 20 MG capsule Take 1 capsule (20 mg total) by mouth daily. FOR ACID REFLUX 90 capsule 0   sacubitril -valsartan  (ENTRESTO ) 24-26 MG Take 1 tablet by mouth 2 (two) times daily. 90 tablet 1   spironolactone  (ALDACTONE ) 25 MG tablet Take 0.5 tablets (12.5 mg total) by mouth daily. 45 tablet 0   tamsulosin  (FLOMAX ) 0.4 MG CAPS capsule Take 1 capsule (0.4 mg total) by mouth daily. FOR PROSTATE SYMPTOMS 90 capsule 0   No current facility-administered medications for this visit.     PHYSICAL EXAMINATION: ECOG PERFORMANCE STATUS: 0 - Asymptomatic  There were no vitals filed for this visit.  There were no vitals filed for this visit.   GENERAL:alert, no distress and comfortable SKIN: skin color, texture, turgor are normal, no rashes or significant lesions EYES: normal, conjunctiva are pink and non-injected, sclera clear OROPHARYNX:no exudate, no erythema and lips, buccal mucosa, and tongue normal  NECK: supple, thyroid  normal size, non-tender, without nodularity LYMPH:  no palpable lymphadenopathy in the cervical, axillary  LUNGS: clear to auscultation and percussion with normal breathing effort HEART: regular rate & rhythm and no murmurs and no lower extremity  edema ABDOMEN:abdomen soft, non-tender and normal bowel sounds Musculoskeletal:no cyanosis of digits and no clubbing  PSYCH: alert & oriented x 3 with fluent speech NEURO: no focal motor/sensory deficits  LABORATORY DATA:  I have reviewed the data as listed Lab Results  Component Value Date   WBC 11.6 (H) 08/17/2024   HGB 14.6 08/17/2024   HCT 48.4 08/17/2024   MCV 81 08/17/2024   PLT 324 08/17/2024     Chemistry  Component Value Date/Time   NA 141 06/04/2024 1414   NA 138 11/26/2023 1024   K 4.1 06/04/2024 1414   CL 108 06/04/2024 1414   CO2 27 06/04/2024 1414   BUN 10 06/04/2024 1414   BUN 14 11/26/2023 1024   CREATININE 1.00 06/04/2024 1414      Component Value Date/Time   CALCIUM  9.1 06/04/2024 1414   ALKPHOS 66 06/04/2024 1414   AST 19 06/04/2024 1414   ALT 19 06/04/2024 1414   BILITOT 0.5 06/04/2024 1414       RADIOGRAPHIC STUDIES: I have personally reviewed the radiological images as listed and agreed with the findings in the report. No results found.  All questions were answered. The patient knows to call the clinic with any problems, questions or concerns. I spent 45 minutes in the care of this patient including H and P, review of records, counseling and coordination of care.     Amber Stalls, MD 09/02/2024 7:20 AM

## 2024-09-03 ENCOUNTER — Telehealth: Payer: Self-pay | Admitting: Hematology and Oncology

## 2024-09-03 NOTE — Telephone Encounter (Signed)
 I spoke with patient and he is aware of appointment on 10/08/2024.

## 2024-09-25 ENCOUNTER — Other Ambulatory Visit: Payer: Self-pay

## 2024-09-28 ENCOUNTER — Telehealth: Payer: Self-pay | Admitting: *Deleted

## 2024-09-30 ENCOUNTER — Other Ambulatory Visit: Payer: Self-pay

## 2024-10-01 ENCOUNTER — Encounter (HOSPITAL_COMMUNITY)

## 2024-10-08 ENCOUNTER — Other Ambulatory Visit: Payer: Self-pay | Admitting: *Deleted

## 2024-10-08 ENCOUNTER — Telehealth: Payer: Self-pay

## 2024-10-08 ENCOUNTER — Other Ambulatory Visit (HOSPITAL_COMMUNITY): Payer: Self-pay | Admitting: Hematology and Oncology

## 2024-10-08 ENCOUNTER — Inpatient Hospital Stay: Admitting: Hematology and Oncology

## 2024-10-08 ENCOUNTER — Telehealth: Payer: Self-pay | Admitting: *Deleted

## 2024-10-08 ENCOUNTER — Inpatient Hospital Stay: Attending: Hematology and Oncology

## 2024-10-08 ENCOUNTER — Other Ambulatory Visit (HOSPITAL_COMMUNITY): Payer: Self-pay | Admitting: Pharmacist

## 2024-10-08 ENCOUNTER — Inpatient Hospital Stay

## 2024-10-08 VITALS — BP 132/70 | HR 105 | Temp 98.1°F | Resp 17 | Ht 68.0 in | Wt 227.8 lb

## 2024-10-08 DIAGNOSIS — Z79899 Other long term (current) drug therapy: Secondary | ICD-10-CM | POA: Insufficient documentation

## 2024-10-08 DIAGNOSIS — E119 Type 2 diabetes mellitus without complications: Secondary | ICD-10-CM | POA: Diagnosis not present

## 2024-10-08 DIAGNOSIS — E785 Hyperlipidemia, unspecified: Secondary | ICD-10-CM | POA: Diagnosis not present

## 2024-10-08 DIAGNOSIS — Z7901 Long term (current) use of anticoagulants: Secondary | ICD-10-CM | POA: Insufficient documentation

## 2024-10-08 DIAGNOSIS — D509 Iron deficiency anemia, unspecified: Secondary | ICD-10-CM | POA: Diagnosis not present

## 2024-10-08 DIAGNOSIS — R531 Weakness: Secondary | ICD-10-CM | POA: Insufficient documentation

## 2024-10-08 DIAGNOSIS — D500 Iron deficiency anemia secondary to blood loss (chronic): Secondary | ICD-10-CM | POA: Diagnosis present

## 2024-10-08 DIAGNOSIS — J45909 Unspecified asthma, uncomplicated: Secondary | ICD-10-CM | POA: Insufficient documentation

## 2024-10-08 DIAGNOSIS — I4819 Other persistent atrial fibrillation: Secondary | ICD-10-CM | POA: Diagnosis not present

## 2024-10-08 DIAGNOSIS — D62 Acute posthemorrhagic anemia: Secondary | ICD-10-CM | POA: Diagnosis not present

## 2024-10-08 DIAGNOSIS — Z860101 Personal history of adenomatous and serrated colon polyps: Secondary | ICD-10-CM | POA: Diagnosis not present

## 2024-10-08 DIAGNOSIS — I110 Hypertensive heart disease with heart failure: Secondary | ICD-10-CM | POA: Diagnosis not present

## 2024-10-08 DIAGNOSIS — K449 Diaphragmatic hernia without obstruction or gangrene: Secondary | ICD-10-CM | POA: Insufficient documentation

## 2024-10-08 LAB — PREPARE RBC (CROSSMATCH)

## 2024-10-08 LAB — CBC WITH DIFFERENTIAL/PLATELET
Abs Immature Granulocytes: 0.06 K/uL (ref 0.00–0.07)
Basophils Absolute: 0.2 K/uL — ABNORMAL HIGH (ref 0.0–0.1)
Basophils Relative: 1 %
Eosinophils Absolute: 0.2 K/uL (ref 0.0–0.5)
Eosinophils Relative: 1 %
HCT: 23.7 % — ABNORMAL LOW (ref 39.0–52.0)
Hemoglobin: 6.7 g/dL — CL (ref 13.0–17.0)
Immature Granulocytes: 1 %
Lymphocytes Relative: 14 %
Lymphs Abs: 1.5 K/uL (ref 0.7–4.0)
MCH: 19.4 pg — ABNORMAL LOW (ref 26.0–34.0)
MCHC: 28.3 g/dL — ABNORMAL LOW (ref 30.0–36.0)
MCV: 68.7 fL — ABNORMAL LOW (ref 80.0–100.0)
Monocytes Absolute: 0.6 K/uL (ref 0.1–1.0)
Monocytes Relative: 5 %
Neutro Abs: 8.5 K/uL — ABNORMAL HIGH (ref 1.7–7.7)
Neutrophils Relative %: 78 %
Platelets: 595 K/uL — ABNORMAL HIGH (ref 150–400)
RBC: 3.45 MIL/uL — ABNORMAL LOW (ref 4.22–5.81)
RDW: 26.3 % — ABNORMAL HIGH (ref 11.5–15.5)
WBC: 10.9 K/uL — ABNORMAL HIGH (ref 4.0–10.5)
nRBC: 0.3 % — ABNORMAL HIGH (ref 0.0–0.2)

## 2024-10-08 LAB — IRON AND IRON BINDING CAPACITY (CC-WL,HP ONLY)
Iron: 12 ug/dL — ABNORMAL LOW (ref 45–182)
Saturation Ratios: 3 % — ABNORMAL LOW (ref 17.9–39.5)
TIBC: 469 ug/dL — ABNORMAL HIGH (ref 250–450)
UIBC: 457 ug/dL

## 2024-10-08 LAB — SAMPLE TO BLOOD BANK

## 2024-10-08 LAB — FERRITIN: Ferritin: 5 ng/mL — ABNORMAL LOW (ref 24–336)

## 2024-10-08 NOTE — Progress Notes (Signed)
 Venofer  300mg  IV weekly x 3 doses entered for [employer]  Previously entered under oncology treatment plan  Sherry Pennant, PharmD, MPH, BCPS, CPP Clinical Pharmacist

## 2024-10-08 NOTE — Progress Notes (Signed)
 Pt is aware that he has an appt on 1/23 at 12 pm. Advised not to take bb band off and arrive 15 minutes before appt starts. Pt verbalized understanding.

## 2024-10-08 NOTE — Progress Notes (Signed)
 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  Severe anemia with hemoglobin of 6.7 g/dL on labs today Hemodynamically stable, with exertional weakness. - Ordered blood transfusion. - Ordered iron  infusion for next week. - Instructed continuation of over-the-counter oral iron  supplementation. - Planned repeat blood count evaluation next week.  Gastrointestinal bleeding Recent melena, now resolved, likely from diverticula or hemorrhoids. Transfusion will not address source; requires monitoring and gastroenterology input. - Advised immediate notification for black stool or active bleeding symptoms. - Planned communication with gastroenterology regarding bleeding and anemia. - Discussed emergency department presentation if symptoms worsen.  HISTORY OF PRESENTING ILLNESS:  Jonathan Gonzalez 64 y.o. male is here because of acute blood loss anemia/IDA  Discussed the use of AI scribe software for clinical note transcription with the patient, who gave verbal consent to proceed.  History of Present Illness  Jonathan Gonzalez is a 64 year old male with iron  deficiency anemia secondary to gastrointestinal hemorrhage who presents for evaluation of symptomatic anemia.  He has experienced a marked decline in hemoglobin from 14.6 to 6.7 g/dL, accompanied by exertional weakness.  He denies lightheadedness, dyspnea, or peripheral edema.  He reports a prior episode of melena approximately one month ago, which has resolved. His most recent bowel movement was normal in appearance, and he denies current melena or evidence of active gastrointestinal bleeding.  He previously received oral iron  supplementation but discontinued due to lack of refill.  All other systems were reviewed with the patient and are negative.  MEDICAL HISTORY:  Past Medical History:  Diagnosis Date   A-fib (HCC)    Anemia    Asthma    Blood transfusion without reported diagnosis 04/30/2024   Diabetes mellitus without complication (HCC)    GERD (gastroesophageal reflux disease)    Hyperlipidemia    Hypertension     SURGICAL HISTORY: Past Surgical History:  Procedure Laterality Date   BONE BIOPSY  05/02/2024   Procedure: BIOPSY, GI;  Surgeon: Charlanne Groom, MD;  Location: [facility] ENDOSCOPY;  Service: Gastroenterology;;   VASSIE STUDY  12/06/2022   Procedure: BUBBLE STUDY;  Surgeon: Loni Soyla LABOR, MD;  Location: [facility] ENDOSCOPY;  Service: Cardiovascular;;   CARDIOVERSION N/A 12/06/2022   Procedure: CARDIOVERSION;  Surgeon: Loni Soyla LABOR, MD;  Location: [facility] ENDOSCOPY;  Service: Cardiovascular;  Laterality: N/A;   CARDIOVERSION N/A 01/15/2023   Procedure: CARDIOVERSION;  Surgeon: Cherrie Toribio SAUNDERS, MD;  Location: [facility] INVASIVE CV LAB;  Service: Cardiovascular;  Laterality: N/A;   CARDIOVERSION N/A 02/25/2023   Procedure: CARDIOVERSION;  Surgeon: Cherrie Toribio SAUNDERS, MD;  Location: [facility] INVASIVE CV LAB;  Service: Cardiovascular;  Laterality: N/A;   COLONOSCOPY N/A 05/02/2024   Procedure: COLONOSCOPY;  Surgeon: Charlanne Groom, MD;  Location: [facility] ENDOSCOPY;  Service: Gastroenterology;  Laterality: N/A;   ESOPHAGOGASTRODUODENOSCOPY N/A 05/02/2024   Procedure: EGD (ESOPHAGOGASTRODUODENOSCOPY);  Surgeon: Charlanne Groom, MD;  Location: [facility] ENDOSCOPY;  Service: Gastroenterology;  Laterality: N/A;   HERNIA REPAIR  POLYPECTOMY  05/02/2024   Procedure: POLYPECTOMY, INTESTINE;  Surgeon: Charlanne Groom, MD;  Location: [facility] ENDOSCOPY;  Service: Gastroenterology;;   TEE WITHOUT CARDIOVERSION N/A 12/06/2022   Procedure: TRANSESOPHAGEAL ECHOCARDIOGRAM (TEE);  Surgeon: Loni Soyla LABOR, MD;  Location: [facility] ENDOSCOPY;  Service: Cardiovascular;  Laterality: N/A;   TEE WITHOUT CARDIOVERSION N/A 01/15/2023   Procedure: TRANSESOPHAGEAL ECHOCARDIOGRAM;  Surgeon: Cherrie Toribio SAUNDERS, MD;  Location: [facility] INVASIVE CV LAB;  Service: Cardiovascular;  Laterality: N/A;    SOCIAL HISTORY: Social History   Socioeconomic History   Marital status: Widowed    Spouse name: Not on file   Number of children: 1   Years of education: Not on file   Highest education level: 12th grade  Occupational History   Occupation: [employer]  Tobacco Use   Smoking status: Never   Smokeless tobacco: Never   Tobacco comments:    Never smoke 10/08/22  Vaping Use   Vaping status: Never Used  Substance and Sexual Activity   Alcohol use: Never   Drug use: Never   Sexual activity: Not Currently    Birth control/protection: Abstinence, None  Other Topics Concern   Not on file  Social History Narrative   Not on file   Social Drivers of Health   Tobacco Use: Low Risk (08/17/2024)   Patient History    Smoking Tobacco Use: Never    Smokeless Tobacco Use: Never    Passive Exposure: Not on file  Financial Resource Strain: Low Risk (08/13/2024)   Overall Financial Resource Strain (CARDIA)    Difficulty of Paying Living Expenses: Not very hard  Food Insecurity: No Food Insecurity (08/13/2024)   Epic    Worried About Programme Researcher, Broadcasting/film/video in the Last Year: Never true    Ran Out of Food in the Last Year: Never true  Transportation Needs: No Transportation Needs (08/13/2024)   Epic    Lack of Transportation (Medical): No    Lack of Transportation (Non-Medical): No  Physical Activity: Inactive (08/13/2024)   Exercise Vital Sign    Days of  Exercise per Week: 0 days    Minutes of Exercise per Session: Not on file  Stress: Stress Concern Present (08/13/2024)   [employer] of Occupational Health - Occupational Stress Questionnaire    Feeling of Stress: To some extent  Social Connections: Socially Isolated (08/13/2024)   Social Connection and Isolation Panel    Frequency of Communication with Friends and Family: Never    Frequency of Social Gatherings with Friends and Family: Never    Attends Religious Services: Never    Database Administrator or Organizations: No    Attends Engineer, Structural: Not on file    Marital Status: Widowed  Intimate Partner Violence: Not At Risk (08/11/2024)   Epic    Fear of Current or Ex-Partner: No    Emotionally Abused: No    Physically Abused: No    Sexually Abused: No  Depression (PHQ2-9): Medium Risk (08/17/2024)   Depression (PHQ2-9)    PHQ-2 Score: 5  Alcohol Screen: Low Risk (06/04/2024)   Alcohol Screen    Last Alcohol Screening Score (AUDIT): 0  Housing: Low Risk (08/13/2024)   Epic    Unable to Pay for Housing in the Last Year: No    Number of Times Moved in the Last Year: 1    Homeless in the Last Year: No  Recent Concern: Housing - High Risk (08/11/2024)   Epic    Unable to Pay for  Housing in the Last Year: Yes    Number of Times Moved in the Last Year: 1    Homeless in the Last Year: No  Utilities: Not At Risk (08/11/2024)   Epic    Threatened with loss of utilities: No  Health Literacy: Adequate Health Literacy (08/26/2023)   B1300 Health Literacy    Frequency of need for help with medical instructions: Never    FAMILY HISTORY: No family history on file.  ALLERGIES:  has no known allergies.  MEDICATIONS:  Current Outpatient Medications  Medication Sig Dispense Refill   albuterol  (VENTOLIN  HFA) 108 (90 Base) MCG/ACT inhaler Inhale 1-2 puffs into the lungs every 6 (six) hours as needed for wheezing or shortness of breath. 18 g 2   amiodarone   (PACERONE ) 200 MG tablet Take 1 tablet (200 mg total) by mouth daily. 90 tablet 2   apixaban  (ELIQUIS ) 5 MG TABS tablet Take 1 tablet (5 mg total) by mouth 2 (two) times daily. 180 tablet 1   atorvastatin  (LIPITOR) 10 MG tablet Take 1 tablet (10 mg total) by mouth daily. For cholesterol 90 tablet 3   ferrous sulfate  325 (65 FE) MG tablet Take 1 tablet (325 mg total) by mouth daily with breakfast. FOR ANEMIA (Patient not taking: Reported on 06/16/2024) 90 tablet 0   furosemide  (LASIX ) 40 MG tablet Take 1 tablet (40 mg total) by mouth every other day. 45 tablet 0   hydrOXYzine  (VISTARIL ) 25 MG capsule Take 1-2 capsules (25-50 mg total) by mouth at bedtime as needed. 60 capsule 0   metFORMIN  (GLUCOPHAGE ) 500 MG tablet Take 1 tablet (500 mg total) by mouth daily. For DIABETES 90 tablet 1   metoprolol  succinate (TOPROL  XL) 50 MG 24 hr tablet Take 1.5 tablets (75 mg total) by mouth daily. Take with or immediately following a meal. 90 tablet 0   omeprazole  (PRILOSEC) 20 MG capsule Take 1 capsule (20 mg total) by mouth daily. FOR ACID REFLUX 90 capsule 0   sacubitril -valsartan  (ENTRESTO ) 24-26 MG Take 1 tablet by mouth 2 (two) times daily. 90 tablet 1   spironolactone  (ALDACTONE ) 25 MG tablet Take 0.5 tablets (12.5 mg total) by mouth daily. 45 tablet 0   tamsulosin  (FLOMAX ) 0.4 MG CAPS capsule Take 1 capsule (0.4 mg total) by mouth daily. FOR PROSTATE SYMPTOMS 90 capsule 0   No current facility-administered medications for this visit.     PHYSICAL EXAMINATION: ECOG PERFORMANCE STATUS: 0 - Asymptomatic  Vitals:   10/08/24 1335  BP: 132/70  Pulse: (!) 105  Resp: 17  Temp: 98.1 F (36.7 C)  SpO2: 100%   Filed Weights   10/08/24 1335  Weight: 227 lb 12.8 oz (103.3 kg)    GENERAL:alert, no distress and comfortable SKIN: skin color, texture, turgor are normal, no rashes or significant lesions EYES: normal, conjunctiva are pink and non-injected, sclera clear OROPHARYNX:no exudate, no erythema  and lips, buccal mucosa, and tongue normal  NECK: supple, thyroid  normal size, non-tender, without nodularity LYMPH:  no palpable lymphadenopathy in the cervical, axillary  LUNGS: clear to auscultation and percussion with normal breathing effort HEART Mild sinus tachycardia. ABDOMEN:abdomen soft, non-tender and normal bowel sounds Musculoskeletal:no cyanosis of digits and no clubbing  PSYCH: alert & oriented x 3 with fluent speech NEURO: no focal motor/sensory deficits  LABORATORY DATA:  I have reviewed the data as listed Lab Results  Component Value Date   WBC 10.9 (H) 10/08/2024   HGB 6.7 (LL) 10/08/2024   HCT 23.7 (L) 10/08/2024  MCV 68.7 (L) 10/08/2024   PLT 595 (H) 10/08/2024     Chemistry      Component Value Date/Time   NA 141 06/04/2024 1414   NA 138 11/26/2023 1024   K 4.1 06/04/2024 1414   CL 108 06/04/2024 1414   CO2 27 06/04/2024 1414   BUN 10 06/04/2024 1414   BUN 14 11/26/2023 1024   CREATININE 1.00 06/04/2024 1414      Component Value Date/Time   CALCIUM  9.1 06/04/2024 1414   ALKPHOS 66 06/04/2024 1414   AST 19 06/04/2024 1414   ALT 19 06/04/2024 1414   BILITOT 0.5 06/04/2024 1414       RADIOGRAPHIC STUDIES: I have personally reviewed the radiological images as listed and agreed with the findings in the report. No results found.  All questions were answered. The patient knows to call the clinic with any problems, questions or concerns. I spent 30 minutes in the care of this patient including H and P, review of records, counseling and coordination of care.     Amber Stalls, MD 10/08/2024 1:47 PM

## 2024-10-08 NOTE — Telephone Encounter (Signed)
 CRITICAL VALUE STICKER  CRITICAL VALUE: 6.7 hgb  RECEIVER (on-site recipient of call):  DATE & TIME NOTIFIED: 10/08/2024 1300  MESSENGER (representative from lab): [place], [place]  MD NOTIFIED: Loretha  TIME OF NOTIFICATION: 1302  RESPONSE: Request prbc

## 2024-10-08 NOTE — Telephone Encounter (Signed)
 Auth Submission: NO AUTH NEEDED Site of care: Site of care: [facility] Payer:  healthy blue medicaid Medication & CPT/J Code(s) submitted: Venofer  (Iron  Sucrose) J1756 Diagnosis Code:  Route of submission (phone, fax, portal):  Phone # Fax # Auth type: Buy/Bill PB Units/visits requested: 300mg  x 3 doses Reference number:  Approval from: 10/08/24 to 01/14/25

## 2024-10-09 ENCOUNTER — Inpatient Hospital Stay

## 2024-10-09 ENCOUNTER — Other Ambulatory Visit: Payer: Self-pay | Admitting: Hematology and Oncology

## 2024-10-09 ENCOUNTER — Other Ambulatory Visit: Payer: Self-pay | Admitting: Adult Health

## 2024-10-09 ENCOUNTER — Other Ambulatory Visit: Payer: Self-pay

## 2024-10-09 DIAGNOSIS — T7840XA Allergy, unspecified, initial encounter: Secondary | ICD-10-CM

## 2024-10-09 DIAGNOSIS — D500 Iron deficiency anemia secondary to blood loss (chronic): Secondary | ICD-10-CM | POA: Diagnosis not present

## 2024-10-09 DIAGNOSIS — D509 Iron deficiency anemia, unspecified: Secondary | ICD-10-CM

## 2024-10-09 DIAGNOSIS — J4521 Mild intermittent asthma with (acute) exacerbation: Secondary | ICD-10-CM

## 2024-10-09 MED ORDER — FUROSEMIDE 10 MG/ML IJ SOLN
20.0000 mg | Freq: Once | INTRAMUSCULAR | Status: AC
  Administered 2024-10-09: 20 mg via INTRAVENOUS
  Filled 2024-10-09: qty 2

## 2024-10-09 MED ORDER — METHYLPREDNISOLONE SODIUM SUCC 125 MG IJ SOLR
125.0000 mg | Freq: Once | INTRAMUSCULAR | Status: AC
  Administered 2024-10-09: 125 mg via INTRAVENOUS

## 2024-10-09 MED ORDER — ACETAMINOPHEN 325 MG PO TABS
650.0000 mg | ORAL_TABLET | Freq: Once | ORAL | Status: AC
  Administered 2024-10-09: 650 mg via ORAL
  Filled 2024-10-09: qty 2

## 2024-10-09 MED ORDER — ALBUTEROL SULFATE HFA 108 (90 BASE) MCG/ACT IN AERS
2.0000 | INHALATION_SPRAY | Freq: Once | RESPIRATORY_TRACT | Status: AC
  Administered 2024-10-09: 2 via RESPIRATORY_TRACT

## 2024-10-09 MED ORDER — METHYLPREDNISOLONE SODIUM SUCC 125 MG IJ SOLR: 125.0000 mg | Freq: Once | INTRAMUSCULAR | Status: AC

## 2024-10-09 MED ORDER — SODIUM CHLORIDE 0.9% IV SOLUTION
250.0000 mL | INTRAVENOUS | Status: DC
  Administered 2024-10-09: 100 mL via INTRAVENOUS

## 2024-10-09 MED ORDER — FAMOTIDINE 20 MG PO TABS
20.0000 mg | ORAL_TABLET | Freq: Once | ORAL | Status: AC
  Administered 2024-10-09: 20 mg via ORAL
  Filled 2024-10-09: qty 1

## 2024-10-09 MED ORDER — FUROSEMIDE 10 MG/ML IJ SOLN: 20.0000 mg | Freq: Once | INTRAMUSCULAR | Status: AC

## 2024-10-09 MED ORDER — ALBUTEROL SULFATE HFA 108 (90 BASE) MCG/ACT IN AERS: 2.0000 | INHALATION_SPRAY | RESPIRATORY_TRACT | Status: DC

## 2024-10-09 NOTE — Progress Notes (Signed)
 RN noticed wheezes in patient's breathing after first unit of PRBCs completed transfusing.  Over several minutes, patient developed cough with moderate amount of clear sputum, became tachypneic, diaphoretic, and dyspnea.  See Flowsheets for VS.  Morna Servant, NP, contacted, assessed patient at bedside.  Solu-Medrol , albuterol  inhaler, and Lasix  administered per NP.  See MAR for details.  Several minutes after emergency medications given, patient reported feeling much better.  VSS (see Flowsheets).  NP re-assessed patient at bedside; per NP, RN held second unit of PRBCs and discharged patient home.  Per NP, RN instructed patient to restart PO Lasix  tomorrow and to take every other day as prescribed (patient reported taking Lasix  only as needed), and to seek emergency help if symptoms returned.  Patient verbalized understanding.  PIV removed.  Patient ambulated to lobby with walker, accompanied by son.

## 2024-10-09 NOTE — Patient Instructions (Signed)
 Blood Transfusion, Adult A blood transfusion is a procedure in which you receive blood or a type of blood cell (blood component) through an IV. You may need a blood transfusion when you have a low blood count, which is a low number of any blood cell. This may result from a bleeding disorder, illness, injury, or surgery. The blood may come from a donor, or you may be able to have your own blood collected and stored (autologous blood donation) before a planned surgery. The blood given in a transfusion may be made up of different blood components. You may receive: Red blood cells. These carry oxygen to the cells in the body. Platelets. These help your blood to clot. Plasma. This is the liquid part of your blood. It carries proteins and other substances throughout the body. White blood cells. These help you fight infections. If you have hemophilia or another clotting disorder, you may also receive other types of blood products. Depending on the type of blood product, this procedure may take 1-4 hours to complete. Tell a health care provider about: Any bleeding problems you have. Any previous reactions you have had during a blood transfusion. Any allergies you have. All medicines you are taking, including vitamins, herbs, eye drops, creams, and over-the-counter medicines. Any surgeries you have had. Any medical conditions you have. Whether you are pregnant or may be pregnant. What are the risks? Talk with your health care provider about risks. The most common problems include: A mild allergic reaction, such as red, swollen areas of skin (hives) and itching. Fever or chills. This may be the body's response to new blood cells received. This may occur during or up to 4 hours after the transfusion. More serious problems may include: A serious allergic reaction that causes difficulty breathing or swelling around the face and lips. Transfusion-associated circulatory overload (TACO), or too much fluid in  the lungs. This may cause breathing problems. Transfusion-related acute lung injury (TRALI), which causes breathing difficulty and low oxygen in the blood. This can occur within hours of the transfusion or several days later. Iron overload. This can happen after receiving many blood transfusions over a period of time. Infection or virus being transmitted. This is rare because donated blood is carefully tested before it is given. Hemolytic transfusion reaction. This is rare. It happens when the body's defense system (immune system)tries to attack the new blood cells. Symptoms may include fever, chills, nausea, low blood pressure, and low back or chest pain. Transfusion-associated graft-versus-host disease (TAGVHD). This is rare. It happens when donated cells attack the body's healthy tissues. What happens before the procedure? You will have a blood test to check your blood type. This test is done to know what kind of blood your body will accept and to match it to the donor blood. If you are going to have a planned surgery, you may be able to do an autologous blood donation. This may be done in case you need to have a transfusion. You will have your temperature, blood pressure, and pulse checked before the transfusion. If you have had an allergic reaction to a transfusion in the past, you may be given medicine to help prevent a reaction. This medicine may be given to you by mouth (orally) or through an IV. What happens during the procedure?  An IV will be inserted into one of your veins. The bag of blood will be attached to your IV. The blood will then enter through your vein. Your temperature, blood pressure,  and pulse will be monitored during the transfusion. This monitoring is done to detect early signs of a transfusion reaction. Tell your nurse right away if you have any of these symptoms during the transfusion: Shortness of breath or trouble breathing. Chest or back pain. Fever or  chills. Itching or hives. If you have any signs or symptoms of a reaction, your transfusion will be stopped and you may be given medicine. When the transfusion is complete, your IV will be removed. Pressure may be applied to the IV site for a few minutes. A bandage (dressing)will be applied. The procedure may vary among health care providers and hospitals. What happens after the procedure? Your temperature, blood pressure, pulse, breathing rate, and blood oxygen level will be monitored until you leave the hospital or clinic. Your blood may be tested to see how you have responded to the transfusion. You may be warmed with fluids or blankets to maintain a normal body temperature. If you receive your blood transfusion in an outpatient setting, you will be told whom to contact to report any reactions. Where to find more information Visit the [employer]: redcross.org Summary A blood transfusion is a procedure in which you receive blood or a type of blood cell (blood component) through an IV. The blood given in a transfusion may be made up of different blood components. You may receive red blood cells, platelets, plasma, or white blood cells depending on the condition treated. Your temperature, blood pressure, and pulse will be monitored before, during, and after the transfusion. After the transfusion, your blood may be tested to see how your body has responded. This information is not intended to replace advice given to you by your health care provider. Make sure you discuss any questions you have with your health care provider. Document Revised: 12/01/2021 Document Reviewed: 12/01/2021 Elsevier Patient Education  2024 [employer].

## 2024-10-09 NOTE — Progress Notes (Signed)
 2 units of PRBC arranged.  Jonathan Gonzalez

## 2024-10-12 LAB — TYPE AND SCREEN
ABO/RH(D): O POS
Antibody Screen: NEGATIVE
Unit division: 0
Unit division: 0

## 2024-10-12 LAB — BPAM RBC
Blood Product Expiration Date: 202601262359
Blood Product Expiration Date: 202602162359
ISSUE DATE / TIME: 202601231109
ISSUE DATE / TIME: 202601231109
Unit Type and Rh: 5100
Unit Type and Rh: 5100

## 2024-10-14 ENCOUNTER — Other Ambulatory Visit: Payer: Self-pay | Admitting: Hematology and Oncology

## 2024-10-14 ENCOUNTER — Telehealth: Payer: Self-pay

## 2024-10-14 NOTE — Telephone Encounter (Signed)
-----   Message from Lynnie Bring, MD sent at 10/14/2024 12:37 PM EST ----- Regarding: Needs reevaluation Absolutely Dr Loretha Will have him come to the clinic Apologize for late reply.  Was not in town Jonathan Gonzalez, Please make appointment in APP clinic or mine whichever is earlier [facility] ----- Message ----- From: Loretha Ash, MD Sent: 10/08/2024   2:27 PM EST To: Lynnie Bring, MD  Dr Bring  Jonathan Gonzalez was seen again today with Hb of 6.7. He had a Hb over 14 about a month ago. He is reporting black stool, not in the past week. I know it was felt he doesn't absorb iron  from paraesophageal hernia but over 7 gms of Hb loss in a month isnt consistent with malabsorption only. I still worry he has some ongoing blood loss. Do you agree with re evaluating him?  Thanks,

## 2024-10-14 NOTE — Telephone Encounter (Signed)
 Pt made aware of recommendations: Pt was scheduled to see Deanna May NP  on 10/19/2024 at 3:20. Pt made aware.  Pt verbalized understanding with all questions answered.

## 2024-10-15 ENCOUNTER — Inpatient Hospital Stay

## 2024-10-15 ENCOUNTER — Inpatient Hospital Stay: Admitting: Adult Health

## 2024-10-15 ENCOUNTER — Other Ambulatory Visit: Payer: Self-pay

## 2024-10-15 ENCOUNTER — Encounter: Payer: Self-pay | Admitting: Adult Health

## 2024-10-15 ENCOUNTER — Ambulatory Visit

## 2024-10-15 VITALS — BP 120/70 | HR 88 | Temp 97.9°F | Resp 17 | Wt 226.1 lb

## 2024-10-15 DIAGNOSIS — D500 Iron deficiency anemia secondary to blood loss (chronic): Secondary | ICD-10-CM | POA: Diagnosis not present

## 2024-10-15 DIAGNOSIS — D509 Iron deficiency anemia, unspecified: Secondary | ICD-10-CM

## 2024-10-15 DIAGNOSIS — K219 Gastro-esophageal reflux disease without esophagitis: Secondary | ICD-10-CM

## 2024-10-15 LAB — CBC WITH DIFFERENTIAL/PLATELET
Abs Immature Granulocytes: 0.05 10*3/uL (ref 0.00–0.07)
Basophils Absolute: 0.1 10*3/uL (ref 0.0–0.1)
Basophils Relative: 1 %
Eosinophils Absolute: 0.1 10*3/uL (ref 0.0–0.5)
Eosinophils Relative: 1 %
HCT: 29.4 % — ABNORMAL LOW (ref 39.0–52.0)
Hemoglobin: 8.6 g/dL — ABNORMAL LOW (ref 13.0–17.0)
Immature Granulocytes: 1 %
Lymphocytes Relative: 12 %
Lymphs Abs: 1.3 10*3/uL (ref 0.7–4.0)
MCH: 20.6 pg — ABNORMAL LOW (ref 26.0–34.0)
MCHC: 29.3 g/dL — ABNORMAL LOW (ref 30.0–36.0)
MCV: 70.3 fL — ABNORMAL LOW (ref 80.0–100.0)
Monocytes Absolute: 0.5 10*3/uL (ref 0.1–1.0)
Monocytes Relative: 5 %
Neutro Abs: 8.6 10*3/uL — ABNORMAL HIGH (ref 1.7–7.7)
Neutrophils Relative %: 80 %
Platelets: 574 10*3/uL — ABNORMAL HIGH (ref 150–400)
RBC: 4.18 MIL/uL — ABNORMAL LOW (ref 4.22–5.81)
RDW: 26 % — ABNORMAL HIGH (ref 11.5–15.5)
WBC: 10.8 10*3/uL — ABNORMAL HIGH (ref 4.0–10.5)
nRBC: 0 % (ref 0.0–0.2)

## 2024-10-15 MED ORDER — OMEPRAZOLE 20 MG PO CPDR
20.0000 mg | DELAYED_RELEASE_CAPSULE | Freq: Two times a day (BID) | ORAL | 0 refills | Status: AC
  Filled 2024-10-15: qty 180, 90d supply, fill #0

## 2024-10-15 NOTE — Progress Notes (Signed)
 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 Loretha to surgery for consideration of hernia repair on 06/25/2024 with Dr. Stevie IV iron  with Venofer  x 3 beginning 06/24/2024;   CURRENT THERAPY: s/p blood transfusion, IV iron   INTERVAL HISTORY:  Discussed the use of AI scribe software for clinical note transcription with the patient, who gave verbal consent to proceed.  History of Present Illness Jonathan Gonzalez is a 64 year old male with severe iron  deficiency anemia who presents for follow-up of persistent anemia.  His hemoglobin declined from 14 to 6.7, then improved to 8.6 after transfusion of two units. Recent labs showed ferritin 5 and iron  saturation 3%. He is not taking oral iron  and is scheduled to start intravenous iron  (Venofer ) on February 4.  He remains on Eliquis . He denies current hematochezia, melena, hematuria, fever, chills, or abdominal pain. He had melena a few weeks ago and hematochezia before his hospitalization. He reports intermittent sudden-onset fatigue when he does too much but is able to perform daily activities, including caring for his child and ambulating with a walker.  He is scheduled for gastroenterology evaluation on February 2 for possible gastrointestinal bleeding as the source of his iron  deficiency anemia.     Patient Active Problem List   Diagnosis Date Noted   Iron  deficiency anemia 10/08/2024   IDA (iron  deficiency anemia) 06/17/2024   Adenomatous polyp of descending colon 05/02/2024   Asthma exacerbation 05/01/2024   Chronic health problem 04/30/2024   Symptomatic anemia 03/12/2024    Heart failure with mildly reduced ejection fraction (HFmrEF) (HCC) 12/03/2022   Hypercoagulable state due to persistent atrial fibrillation (HCC) 10/08/2022   DM (diabetes mellitus) (HCC) 09/14/2022   HTN (hypertension) 09/14/2022   HLD (hyperlipidemia) 09/14/2022   Atrial fibrillation 09/14/2022    has no known allergies.  MEDICAL HISTORY: Past Medical History:  Diagnosis Date   A-fib (HCC)    Anemia    Asthma    Blood transfusion without reported diagnosis 04/30/2024   Diabetes mellitus without complication (HCC)    GERD (gastroesophageal reflux disease)    Hyperlipidemia    Hypertension     SURGICAL HISTORY: Past Surgical History:  Procedure Laterality Date   BONE BIOPSY  05/02/2024   Procedure: BIOPSY, GI;  Surgeon: Charlanne Groom, MD;  Location: [facility] ENDOSCOPY;  Service: Gastroenterology;;   VASSIE STUDY  12/06/2022   Procedure: BUBBLE STUDY;  Surgeon: Loni Soyla LABOR, MD;  Location: [facility] ENDOSCOPY;  Service: Cardiovascular;;   CARDIOVERSION N/A 12/06/2022   Procedure: CARDIOVERSION;  Surgeon: Loni Soyla LABOR, MD;  Location: [facility] ENDOSCOPY;  Service: Cardiovascular;  Laterality: N/A;   CARDIOVERSION N/A 01/15/2023   Procedure: CARDIOVERSION;  Surgeon: Cherrie Toribio SAUNDERS, MD;  Location: [facility] INVASIVE CV LAB;  Service: Cardiovascular;  Laterality: N/A;   CARDIOVERSION N/A 02/25/2023   Procedure: CARDIOVERSION;  Surgeon: Cherrie Toribio SAUNDERS, MD;  Location: [facility] INVASIVE CV LAB;  Service: Cardiovascular;  Laterality: N/A;   COLONOSCOPY N/A 05/02/2024   Procedure: COLONOSCOPY;  Surgeon: Charlanne Groom, MD;  Location: [facility] ENDOSCOPY;  Service: Gastroenterology;  Laterality: N/A;   ESOPHAGOGASTRODUODENOSCOPY N/A 05/02/2024   Procedure: EGD (ESOPHAGOGASTRODUODENOSCOPY);  Surgeon: Charlanne Groom, MD;  Location: [facility] ENDOSCOPY;  Service:  Gastroenterology;  Laterality: N/A;   HERNIA REPAIR     POLYPECTOMY  05/02/2024   Procedure: POLYPECTOMY, INTESTINE;  Surgeon: Charlanne Groom, MD;  Location: [facility]  ENDOSCOPY;  Service: Gastroenterology;;   TEE WITHOUT CARDIOVERSION N/A 12/06/2022   Procedure: TRANSESOPHAGEAL ECHOCARDIOGRAM (TEE);  Surgeon: Loni Soyla LABOR, MD;  Location: [facility] ENDOSCOPY;  Service: Cardiovascular;  Laterality: N/A;   TEE WITHOUT CARDIOVERSION N/A 01/15/2023   Procedure: TRANSESOPHAGEAL ECHOCARDIOGRAM;  Surgeon: Cherrie Toribio SAUNDERS, MD;  Location: [facility] INVASIVE CV LAB;  Service: Cardiovascular;  Laterality: N/A;    SOCIAL HISTORY: Social History   Socioeconomic History   Marital status: Widowed    Spouse name: Not on file   Number of children: 1   Years of education: Not on file   Highest education level: 12th grade  Occupational History   Occupation: [employer]  Tobacco Use   Smoking status: Never   Smokeless tobacco: Never   Tobacco comments:    Never smoke 10/08/22  Vaping Use   Vaping status: Never Used  Substance and Sexual Activity   Alcohol use: Never   Drug use: Never   Sexual activity: Not Currently    Birth control/protection: Abstinence, None  Other Topics Concern   Not on file  Social History Narrative   Not on file   Social Drivers of Health   Tobacco Use: Low Risk (10/15/2024)   Patient History    Smoking Tobacco Use: Never    Smokeless Tobacco Use: Never    Passive Exposure: Not on file  Financial Resource Strain: Low Risk (10/15/2024)   Overall Financial Resource Strain (CARDIA)    Difficulty of Paying Living Expenses: Not hard at all  Food Insecurity: No Food Insecurity (10/15/2024)   Epic    Worried About Radiation Protection Practitioner of Food in the Last Year: Never true    Ran Out of Food in the Last Year: Never true  Transportation Needs: No Transportation Needs (10/15/2024)   Epic    Lack of Transportation (Medical): No    Lack of Transportation (Non-Medical): No  Physical Activity: Inactive (10/15/2024)   Exercise Vital Sign    Days of Exercise per Week: 0 days    Minutes of Exercise per Session: 0 min  Stress: No Stress Concern Present  (10/15/2024)   [employer] of Occupational Health - Occupational Stress Questionnaire    Feeling of Stress: Not at all  Recent Concern: Stress - Stress Concern Present (08/13/2024)   [employer] of Occupational Health - Occupational Stress Questionnaire    Feeling of Stress: To some extent  Social Connections: Socially Isolated (10/15/2024)   Social Connection and Isolation Panel    Frequency of Communication with Friends and Family: More than three times a week    Frequency of Social Gatherings with Friends and Family: More than three times a week    Attends Religious Services: Never    Database Administrator or Organizations: No    Attends Banker Meetings: Never    Marital Status: Widowed  Intimate Partner Violence: Not At Risk (10/15/2024)   Epic    Fear of Current or Ex-Partner: No    Emotionally Abused: No    Physically Abused: No    Sexually Abused: No  Depression (PHQ2-9): Low Risk (10/15/2024)   Depression (PHQ2-9)    PHQ-2 Score: 0  Recent Concern: Depression (PHQ2-9) - Medium Risk (08/17/2024)   Depression (PHQ2-9)    PHQ-2 Score: 5  Alcohol Screen: Low Risk (10/15/2024)   Alcohol  Screen    Last Alcohol Screening Score (AUDIT): 0  Housing: Unknown (10/15/2024)   Epic    Unable to Pay for Housing in the Last Year: No    Number of Times Moved in the Last Year: Not on file    Homeless in the Last Year: No  Recent Concern: Housing - High Risk (08/11/2024)   Epic    Unable to Pay for Housing in the Last Year: Yes    Number of Times Moved in the Last Year: 1    Homeless in the Last Year: No  Utilities: Not At Risk (10/15/2024)   Epic    Threatened with loss of utilities: No  Health Literacy: Adequate Health Literacy (10/15/2024)   B1300 Health Literacy    Frequency of need for help with medical instructions: Never    FAMILY HISTORY: History reviewed. No pertinent family history.  Review of Systems  Constitutional:  Positive for fatigue. Negative  for appetite change, chills, fever and unexpected weight change.  HENT:   Negative for hearing loss, lump/mass, mouth sores and trouble swallowing.   Eyes:  Negative for eye problems and icterus.  Respiratory:  Negative for chest tightness, cough and shortness of breath.   Cardiovascular:  Negative for chest pain, leg swelling and palpitations.  Gastrointestinal:  Negative for abdominal distention, abdominal pain, constipation, diarrhea, nausea and vomiting.  Endocrine: Negative for hot flashes.  Genitourinary:  Negative for difficulty urinating.   Musculoskeletal:  Negative for arthralgias.  Skin:  Negative for itching and rash.  Neurological:  Negative for dizziness, extremity weakness, headaches and numbness.  Hematological:  Negative for adenopathy. Does not bruise/bleed easily.  Psychiatric/Behavioral:  Negative for depression. The patient is not nervous/anxious.       PHYSICAL EXAMINATION   Onc Performance Status - 10/15/24 1232       ECOG Perf Status   ECOG Perf Status Ambulatory and capable of all selfcare but unable to carry out any work activities.  Up and about more than 50% of waking hours      KPS SCALE   KPS % SCORE Able to carry on normal activity, minor s/s of disease          Vitals:   10/15/24 1228  BP: 120/70  Pulse: 88  Resp: 17  Temp: 97.9 F (36.6 C)  SpO2: 95%    Physical Exam Constitutional:      General: He is not in acute distress.    Appearance: Normal appearance. He is not toxic-appearing.  HENT:     Head: Normocephalic and atraumatic.     Mouth/Throat:     Mouth: Mucous membranes are moist.     Pharynx: Oropharynx is clear. No oropharyngeal exudate or posterior oropharyngeal erythema.  Eyes:     General: No scleral icterus. Cardiovascular:     Rate and Rhythm: Normal rate and regular rhythm.     Pulses: Normal pulses.     Heart sounds: Normal heart sounds.  Pulmonary:     Effort: Pulmonary effort is normal.     Breath sounds:  Normal breath sounds.  Abdominal:     General: Abdomen is flat. Bowel sounds are normal. There is no distension.     Palpations: Abdomen is soft.     Tenderness: There is abdominal tenderness (epigastric area +TTP).  Musculoskeletal:        General: No swelling.     Cervical back: Neck supple.  Lymphadenopathy:     Cervical: No cervical adenopathy.  Skin:  General: Skin is warm and dry.     Findings: No rash.  Neurological:     General: No focal deficit present.     Mental Status: He is alert.  Psychiatric:        Mood and Affect: Mood normal.        Behavior: Behavior normal.     LABORATORY DATA:  CBC    Component Value Date/Time   WBC 10.8 (H) 10/15/2024 1119   RBC 4.18 (L) 10/15/2024 1119   HGB 8.6 (L) 10/15/2024 1119   HGB 14.6 08/17/2024 0955   HCT 29.4 (L) 10/15/2024 1119   HCT 48.4 08/17/2024 0955   PLT 574 (H) 10/15/2024 1119   PLT 324 08/17/2024 0955   MCV 70.3 (L) 10/15/2024 1119   MCV 81 08/17/2024 0955   MCH 20.6 (L) 10/15/2024 1119   MCHC 29.3 (L) 10/15/2024 1119   RDW 26.0 (H) 10/15/2024 1119   RDW 18.7 (H) 08/17/2024 0955   LYMPHSABS 1.3 10/15/2024 1119   LYMPHSABS 1.7 08/17/2024 0955   MONOABS 0.5 10/15/2024 1119   EOSABS 0.1 10/15/2024 1119   EOSABS 0.2 08/17/2024 0955   BASOSABS 0.1 10/15/2024 1119   BASOSABS 0.1 08/17/2024 0955       ASSESSMENT and THERAPY PLAN:   Assessment and Plan Assessment & Plan Iron  deficiency anemia Chronic, severe iron  deficiency anemia with recent hemoglobin decline, likely due to gastrointestinal blood loss. Increased bleeding risk due to apixaban . Recent transfusion improved hemoglobin. Discovered previous NSAID use before black stool was discovered. - Scheduled intravenous Venofer  to begin February 4th. - Coordinated gastroenterology follow-up with Dr. Charlanne APP Deanna May on February 2nd. - Tentatively scheduled laboratory studies for next week, to be performed at gastroenterology if possible. - Advised  to avoid NSAIDs, including naproxen, due to increased risk of gastrointestinal bleeding, especially while on apixaban . - Instructed to monitor for symptoms of recurrent bleeding (melena, hematochezia, hematuria) and anemia, and to avoid overexertion. - Planned to closely monitor laboratory values and clinical status; will consider further transfusion if hemoglobin declines. - Reached out to Dr. Stevie office to understand if hernia surgery will be scheduled.   + Epigastric tenderness to palpation - Instructed to increase omeprazole  to twice daily until he sees GI next week for further evaluation and recommendations.  - Advised to avoid NSAIDs to minimize risk of gastric irritation and ulceration.   All questions were answered. The patient knows to call the clinic with any problems, questions or concerns. We can certainly see the patient much sooner if necessary.  Total encounter time:30 minutes*in face-to-face visit time, chart review, lab review, care coordination, order entry, and documentation of the encounter time.    Morna Kendall, NP 10/15/24 12:41 PM Medical Oncology and Hematology [facility] [address] [place], [place] [address] Tel. [phone]    Fax. [phone]  *Total Encounter Time as defined by the Centers for Medicare and Medicaid Services includes, in addition to the face-to-face time of a patient visit (documented in the note above) non-face-to-face time: obtaining and reviewing outside history, ordering and reviewing medications, tests or procedures, care coordination (communications with other health care professionals or caregivers) and documentation in the medical record.

## 2024-10-15 NOTE — Addendum Note (Signed)
 Addended by: DAYNE SHERRY RAMAN on: 10/15/2024 08:09 AM   Modules accepted: Orders

## 2024-10-16 ENCOUNTER — Telehealth: Payer: Self-pay | Admitting: Adult Health

## 2024-10-16 NOTE — Telephone Encounter (Signed)
 I spoke with patient and he is aware of rescheduled lab and NP appointments from 10/29/2024 to 11/02/2024.

## 2024-10-19 ENCOUNTER — Ambulatory Visit: Admitting: Gastroenterology

## 2024-10-19 ENCOUNTER — Encounter: Payer: Self-pay | Admitting: Hematology and Oncology

## 2024-10-19 ENCOUNTER — Telehealth: Payer: Self-pay | Admitting: Pharmacy Technician

## 2024-10-19 NOTE — Telephone Encounter (Signed)
 Both Medicare A/B & [facility] Medicare are active. Called and spoke w/patient to confirm [facility] is his primary insurance.   Auth Submission: NO AUTH NEEDED Site of care: Site of care: [facility] Payer: [place] healthy [employer] medicaid Medication & CPT/J Code(s) submitted: Venofer  (Iron  Sucrose) J1756 Diagnosis Code:  Route of submission (phone, fax, portal):  Phone # Fax # Auth type: Buy/Bill PB Units/visits requested: 300mg  x 3 doses Reference number:  Approval from: 10/08/24 to 01/14/25

## 2024-10-20 ENCOUNTER — Other Ambulatory Visit: Payer: Self-pay

## 2024-10-21 ENCOUNTER — Ambulatory Visit

## 2024-10-22 NOTE — Progress Notes (Unsigned)
 Jonathan Gonzalez

## 2024-10-23 ENCOUNTER — Ambulatory Visit: Admitting: Gastroenterology

## 2024-10-27 ENCOUNTER — Telehealth: Payer: Self-pay | Admitting: *Deleted

## 2024-10-29 ENCOUNTER — Inpatient Hospital Stay

## 2024-10-29 ENCOUNTER — Inpatient Hospital Stay: Admitting: Adult Health

## 2024-11-02 ENCOUNTER — Inpatient Hospital Stay: Attending: Hematology and Oncology

## 2024-11-02 ENCOUNTER — Inpatient Hospital Stay: Admitting: Adult Health

## 2024-11-16 ENCOUNTER — Ambulatory Visit: Admitting: Nurse Practitioner

## 2025-01-21 ENCOUNTER — Ambulatory Visit (HOSPITAL_COMMUNITY)
